# Patient Record
Sex: Female | Born: 1960 | Race: Black or African American | Hispanic: No | Marital: Married | Smoking: Former smoker
Health system: Southern US, Community
[De-identification: ages and names within clinical notes are randomized; demographics above are authoritative.]

## PROBLEM LIST (undated history)

## (undated) DIAGNOSIS — K5900 Constipation, unspecified: Secondary | ICD-10-CM

## (undated) DIAGNOSIS — M549 Dorsalgia, unspecified: Secondary | ICD-10-CM

## (undated) DIAGNOSIS — E876 Hypokalemia: Secondary | ICD-10-CM

## (undated) DIAGNOSIS — E669 Obesity, unspecified: Secondary | ICD-10-CM

## (undated) DIAGNOSIS — I499 Cardiac arrhythmia, unspecified: Secondary | ICD-10-CM

## (undated) DIAGNOSIS — B059 Measles without complication: Secondary | ICD-10-CM

## (undated) DIAGNOSIS — R600 Localized edema: Secondary | ICD-10-CM

## (undated) DIAGNOSIS — I471 Supraventricular tachycardia, unspecified: Secondary | ICD-10-CM

## (undated) DIAGNOSIS — Z0000 Encounter for general adult medical examination without abnormal findings: Secondary | ICD-10-CM

## (undated) DIAGNOSIS — F410 Panic disorder [episodic paroxysmal anxiety] without agoraphobia: Secondary | ICD-10-CM

## (undated) DIAGNOSIS — M2550 Pain in unspecified joint: Secondary | ICD-10-CM

## (undated) DIAGNOSIS — M1990 Unspecified osteoarthritis, unspecified site: Secondary | ICD-10-CM

## (undated) DIAGNOSIS — B019 Varicella without complication: Secondary | ICD-10-CM

## (undated) DIAGNOSIS — Z0001 Encounter for general adult medical examination with abnormal findings: Secondary | ICD-10-CM

## (undated) DIAGNOSIS — J302 Other seasonal allergic rhinitis: Secondary | ICD-10-CM

## (undated) DIAGNOSIS — D259 Leiomyoma of uterus, unspecified: Secondary | ICD-10-CM

## (undated) DIAGNOSIS — R42 Dizziness and giddiness: Secondary | ICD-10-CM

## (undated) DIAGNOSIS — I10 Essential (primary) hypertension: Secondary | ICD-10-CM

## (undated) DIAGNOSIS — I209 Angina pectoris, unspecified: Secondary | ICD-10-CM

## (undated) DIAGNOSIS — R0602 Shortness of breath: Secondary | ICD-10-CM

## (undated) HISTORY — DX: Measles without complication: B05.9

## (undated) HISTORY — DX: Varicella without complication: B01.9

## (undated) HISTORY — DX: Hypokalemia: E87.6

## (undated) HISTORY — DX: Localized edema: R60.0

## (undated) HISTORY — DX: Constipation, unspecified: K59.00

## (undated) HISTORY — DX: Shortness of breath: R06.02

## (undated) HISTORY — DX: Encounter for general adult medical examination with abnormal findings: Z00.01

## (undated) HISTORY — DX: Essential (primary) hypertension: I10

## (undated) HISTORY — DX: Other seasonal allergic rhinitis: J30.2

## (undated) HISTORY — DX: Dorsalgia, unspecified: M54.9

## (undated) HISTORY — DX: Dizziness and giddiness: R42

## (undated) HISTORY — DX: Encounter for general adult medical examination without abnormal findings: Z00.00

## (undated) HISTORY — DX: Pain in unspecified joint: M25.50

## (undated) HISTORY — DX: Obesity, unspecified: E66.9

## (undated) HISTORY — DX: Leiomyoma of uterus, unspecified: D25.9

---
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
## 1988-11-27 HISTORY — PX: ABDOMINAL HYSTERECTOMY: SHX81

## 1999-02-09 ENCOUNTER — Other Ambulatory Visit: Admission: RE | Admit: 1999-02-09 | Discharge: 1999-02-09 | Payer: Self-pay | Admitting: Obstetrics & Gynecology

## 2000-03-01 ENCOUNTER — Other Ambulatory Visit: Admission: RE | Admit: 2000-03-01 | Discharge: 2000-03-01 | Payer: Self-pay | Admitting: Obstetrics & Gynecology

## 2001-04-11 ENCOUNTER — Other Ambulatory Visit: Admission: RE | Admit: 2001-04-11 | Discharge: 2001-04-11 | Payer: Self-pay | Admitting: Obstetrics & Gynecology

## 2002-05-10 ENCOUNTER — Other Ambulatory Visit: Admission: RE | Admit: 2002-05-10 | Discharge: 2002-05-10 | Payer: Self-pay | Admitting: Obstetrics & Gynecology

## 2002-08-06 ENCOUNTER — Other Ambulatory Visit: Admission: RE | Admit: 2002-08-06 | Discharge: 2002-08-06 | Payer: Self-pay | Admitting: Obstetrics & Gynecology

## 2003-01-30 ENCOUNTER — Other Ambulatory Visit: Admission: RE | Admit: 2003-01-30 | Discharge: 2003-01-30 | Payer: Self-pay | Admitting: Obstetrics & Gynecology

## 2003-05-17 ENCOUNTER — Other Ambulatory Visit: Admission: RE | Admit: 2003-05-17 | Discharge: 2003-05-17 | Payer: Self-pay | Admitting: Obstetrics & Gynecology

## 2003-10-30 ENCOUNTER — Other Ambulatory Visit: Admission: RE | Admit: 2003-10-30 | Discharge: 2003-10-30 | Payer: Self-pay | Admitting: Obstetrics & Gynecology

## 2004-07-17 ENCOUNTER — Ambulatory Visit: Payer: Self-pay | Admitting: Internal Medicine

## 2004-12-07 ENCOUNTER — Other Ambulatory Visit: Admission: RE | Admit: 2004-12-07 | Discharge: 2004-12-07 | Payer: Self-pay | Admitting: Obstetrics & Gynecology

## 2004-12-23 ENCOUNTER — Ambulatory Visit: Payer: Self-pay | Admitting: Internal Medicine

## 2006-01-24 ENCOUNTER — Ambulatory Visit: Payer: Self-pay | Admitting: Internal Medicine

## 2006-03-03 ENCOUNTER — Ambulatory Visit: Payer: Self-pay | Admitting: Internal Medicine

## 2007-02-28 ENCOUNTER — Ambulatory Visit: Payer: Self-pay | Admitting: Internal Medicine

## 2007-02-28 DIAGNOSIS — M25569 Pain in unspecified knee: Secondary | ICD-10-CM | POA: Insufficient documentation

## 2007-02-28 DIAGNOSIS — E785 Hyperlipidemia, unspecified: Secondary | ICD-10-CM | POA: Insufficient documentation

## 2007-02-28 DIAGNOSIS — J309 Allergic rhinitis, unspecified: Secondary | ICD-10-CM | POA: Insufficient documentation

## 2007-03-08 ENCOUNTER — Encounter: Payer: Self-pay | Admitting: Internal Medicine

## 2007-03-17 ENCOUNTER — Ambulatory Visit (HOSPITAL_COMMUNITY): Admission: RE | Admit: 2007-03-17 | Discharge: 2007-03-17 | Payer: Self-pay | Admitting: Orthopedic Surgery

## 2007-06-05 ENCOUNTER — Telehealth: Payer: Self-pay | Admitting: Internal Medicine

## 2007-09-08 ENCOUNTER — Telehealth: Payer: Self-pay | Admitting: Internal Medicine

## 2008-01-04 ENCOUNTER — Ambulatory Visit: Payer: Self-pay | Admitting: Internal Medicine

## 2008-01-04 DIAGNOSIS — M545 Low back pain, unspecified: Secondary | ICD-10-CM | POA: Insufficient documentation

## 2008-01-04 DIAGNOSIS — M549 Dorsalgia, unspecified: Secondary | ICD-10-CM | POA: Insufficient documentation

## 2008-04-04 ENCOUNTER — Emergency Department (HOSPITAL_COMMUNITY): Admission: EM | Admit: 2008-04-04 | Discharge: 2008-04-04 | Payer: Self-pay | Admitting: Emergency Medicine

## 2009-12-07 ENCOUNTER — Emergency Department (HOSPITAL_BASED_OUTPATIENT_CLINIC_OR_DEPARTMENT_OTHER): Admission: EM | Admit: 2009-12-07 | Discharge: 2009-12-07 | Payer: Self-pay | Admitting: Emergency Medicine

## 2010-06-11 LAB — POCT CARDIAC MARKERS: Troponin i, poc: <0.05 ng/mL (ref 0.00–0.09)

## 2010-06-11 LAB — DIFFERENTIAL
Basophils Absolute: 0.1 10*3/uL (ref 0.0–0.1)
Eosinophils Absolute: 0.1 10*3/uL (ref 0.0–0.7)
Eosinophils Relative: 1 % (ref 0–5)
Monocytes Absolute: 0.6 10*3/uL (ref 0.1–1.0)
Monocytes Relative: 8 % (ref 3–12)
Neutro Abs: 4.1 10*3/uL (ref 1.7–7.7)
Neutrophils Relative %: 49 % (ref 43–77)

## 2010-06-11 LAB — CBC
Hemoglobin: 14.2 g/dL (ref 12.0–15.0)
MCV: 89.8 fL (ref 78.0–100.0)
RBC: 4.76 MIL/uL (ref 3.87–5.11)
RDW: 14.9 % (ref 11.5–15.5)
WBC: 8.2 10*3/uL (ref 4.0–10.5)

## 2010-06-11 LAB — BASIC METABOLIC PANEL
CO2: 23 mEq/L (ref 19–32)
Calcium: 9.7 mg/dL (ref 8.4–10.5)
Chloride: 108 mEq/L (ref 96–112)
Potassium: 3.4 mEq/L — ABNORMAL LOW (ref 3.5–5.1)

## 2011-03-30 DIAGNOSIS — I499 Cardiac arrhythmia, unspecified: Secondary | ICD-10-CM

## 2011-03-30 HISTORY — DX: Cardiac arrhythmia, unspecified: I49.9

## 2011-07-29 ENCOUNTER — Encounter: Payer: Self-pay | Admitting: Internal Medicine

## 2011-09-03 ENCOUNTER — Institutional Professional Consult (permissible substitution): Payer: Self-pay | Admitting: Internal Medicine

## 2011-09-21 ENCOUNTER — Encounter: Payer: Self-pay | Admitting: *Deleted

## 2011-09-21 ENCOUNTER — Ambulatory Visit (INDEPENDENT_AMBULATORY_CARE_PROVIDER_SITE_OTHER): Payer: 59 | Admitting: Internal Medicine

## 2011-09-21 ENCOUNTER — Encounter: Payer: Self-pay | Admitting: Internal Medicine

## 2011-09-21 VITALS — BP 140/56 | HR 100 | Resp 18 | Ht 63.0 in | Wt 215.0 lb

## 2011-09-21 DIAGNOSIS — I471 Supraventricular tachycardia: Secondary | ICD-10-CM | POA: Insufficient documentation

## 2011-09-21 DIAGNOSIS — I498 Other specified cardiac arrhythmias: Secondary | ICD-10-CM

## 2011-09-21 LAB — BASIC METABOLIC PANEL
Calcium: 9.6 mg/dL (ref 8.4–10.5)
Chloride: 105 mEq/L (ref 96–112)
Creatinine, Ser: 0.8 mg/dL (ref 0.4–1.2)
GFR: 98.58 mL/min (ref >60.00)
Sodium: 141 mEq/L (ref 135–145)

## 2011-09-21 LAB — CBC WITH DIFFERENTIAL/PLATELET
Basophils Relative: 0.8 % (ref 0.0–3.0)
Eosinophils Relative: 1.2 % (ref 0.0–5.0)
Lymphs Abs: 3.5 10*3/uL (ref 0.7–4.0)
MCHC: 32.5 g/dL (ref 30.0–36.0)
MCV: 90.5 fl (ref 78.0–100.0)
Monocytes Absolute: 0.9 10*3/uL (ref 0.1–1.0)
RDW: 15.5 % — ABNORMAL HIGH (ref 11.5–14.6)
WBC: 9.7 10*3/uL (ref 4.5–10.5)

## 2011-09-21 NOTE — Patient Instructions (Signed)

## 2011-09-21 NOTE — Assessment & Plan Note (Signed)
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 I've discussed the risk, goals, benefits, and expectations of catheter ablation. She wishes to proceed. This will be scheduled early as possible pending at time.

## 2011-09-21 NOTE — Progress Notes (Signed)
HPI Sherri Sullivan returns today for followup. She is a very pleasant middle-aged woman with a history of tachycardia palpitations and documented SVT for over 2 years. The episodes start and stop suddenly. She's been on beta blockers without improvement. When she goes into SVT she feels short of breath but has not had syncope and does not have angina. Her baseline echocardiogram demonstrates no ventricular preexcitation. There is no history of atrial fibrillation. No Known Allergies   Current Outpatient Prescriptions  Medication Sig Dispense Refill  . aspirin 81 MG tablet Take 81 mg by mouth daily.      . cetirizine (ZYRTEC) 10 MG tablet Take 10 mg by mouth daily.      . chlorthalidone (HYGROTON) 25 MG tablet Take 25 mg by mouth daily.      . cyclobenzaprine (FLEXERIL) 5 MG tablet Take 5 mg by mouth 3 (three) times daily as needed.      . estrogens, conjugated, (PREMARIN) 0.625 MG tablet Take 0.625 mg by mouth daily. Take daily for 21 days then do not take for 7 days.      . metoprolol succinate (TOPROL-XL) 25 MG 24 hr tablet Take 25 mg by mouth daily.      . naproxen (NAPROSYN) 500 MG tablet Take 500 mg by mouth 2 (two) times daily with a meal.      . OMEGA-3 KRILL OIL PO Take 353 mg by mouth daily.      . potassium chloride SA (K-DUR,KLOR-CON) 20 MEQ tablet Take 20 mEq by mouth 2 (two) times daily.         Past Medical History  Diagnosis Date  . Chest pain   . Rapid heart rate   . Dizziness   . SOB (shortness of breath)   . HTN (hypertension)     ROS:   All systems reviewed and negative except as noted in the HPI.   Past Surgical History  Procedure Date  . Abdominal hysterectomy      No family history on file.   History   Social History  . Marital Status: Married    Spouse Name: N/A    Number of Children: N/A  . Years of Education: N/A   Occupational History  . Not on file.   Social History Main Topics  . Smoking status: Former Smoker    Types: Cigarettes   Quit date: 03/29/1984  . Smokeless tobacco: Not on file  . Alcohol Use: Yes     wine 4 glasses a week  . Drug Use: No  . Sexually Active: Not on file   Other Topics Concern  . Not on file   Social History Narrative  . No narrative on file     BP 140/56  Pulse 100  Resp 18  Ht 5' 3" (1.6 m)  Wt 215 lb (97.523 kg)  BMI 38.09 kg/m2  Physical Exam:  Well appearing middle-aged woman, NAD HEENT: Unremarkable Neck:  No JVD, no thyromegally Lungs:  Clear with no wheezes, rales, or rhonchi. HEART:  Regular tachy rhythm, no murmurs, no rubs, no clicks Abd:  soft, positive bowel sounds, no organomegally, no rebound, no guarding Ext:  2 plus pulses, no edema, no cyanosis, no clubbing Skin:  No rashes no nodules Neuro:  CN II through XII intact, motor grossly intact  EKG SVT at 155 beats per minute  Assess/Plan:   

## 2011-09-22 ENCOUNTER — Encounter (HOSPITAL_COMMUNITY): Payer: Self-pay | Admitting: Respiratory Therapy

## 2011-09-23 ENCOUNTER — Ambulatory Visit (HOSPITAL_COMMUNITY)
Admission: AD | Admit: 2011-09-23 | Discharge: 2011-09-24 | Disposition: A | Discharge status: Home / Self Care | Payer: 59 | Source: Ambulatory Visit | Attending: Internal Medicine | Admitting: Internal Medicine

## 2011-09-23 ENCOUNTER — Encounter (HOSPITAL_COMMUNITY)
Admission: AD | Disposition: A | Discharge status: Home / Self Care | Payer: Self-pay | Source: Ambulatory Visit | Attending: Internal Medicine

## 2011-09-23 ENCOUNTER — Encounter (HOSPITAL_COMMUNITY): Payer: Self-pay | Admitting: General Practice

## 2011-09-23 DIAGNOSIS — I471 Supraventricular tachycardia: Secondary | ICD-10-CM

## 2011-09-23 DIAGNOSIS — I498 Other specified cardiac arrhythmias: Secondary | ICD-10-CM | POA: Insufficient documentation

## 2011-09-23 DIAGNOSIS — R0602 Shortness of breath: Secondary | ICD-10-CM

## 2011-09-23 DIAGNOSIS — I10 Essential (primary) hypertension: Secondary | ICD-10-CM | POA: Insufficient documentation

## 2011-09-23 HISTORY — DX: Supraventricular tachycardia, unspecified: I47.10

## 2011-09-23 HISTORY — DX: Supraventricular tachycardia: I47.1

## 2011-09-23 HISTORY — DX: Shortness of breath: R06.02

## 2011-09-23 HISTORY — DX: Angina pectoris, unspecified: I20.9

## 2011-09-23 HISTORY — PX: CARDIAC ELECTROPHYSIOLOGY STUDY AND ABLATION: SHX1294

## 2011-09-23 HISTORY — PX: SUPRAVENTRICULAR TACHYCARDIA ABLATION: SHX5492

## 2011-09-23 LAB — BASIC METABOLIC PANEL
Calcium: 9.5 mg/dL (ref 8.4–10.5)
Creatinine, Ser: 0.61 mg/dL (ref 0.50–1.10)
GFR calc Af Amer: >90 mL/min (ref >90)

## 2011-09-23 SURGERY — SUPRAVENTRICULAR TACHYCARDIA ABLATION
Anesthesia: LOCAL

## 2011-09-23 MED ORDER — CYCLOBENZAPRINE HCL 5 MG PO TABS
5.0000 mg | ORAL_TABLET | Freq: Three times a day (TID) | ORAL | Status: DC | PRN
  Filled 2011-09-23: qty 1

## 2011-09-23 MED ORDER — MIDAZOLAM HCL 5 MG/5ML IJ SOLN
INTRAMUSCULAR | Status: AC
  Filled 2011-09-23: qty 5

## 2011-09-23 MED ORDER — METOPROLOL SUCCINATE ER 25 MG PO TB24
25.0000 mg | ORAL_TABLET | Freq: Every day | ORAL | Status: DC
  Filled 2011-09-23 (×2): qty 1

## 2011-09-23 MED ORDER — SODIUM CHLORIDE 0.9 % IJ SOLN: 3.0000 mL | INTRAMUSCULAR | Status: DC | PRN

## 2011-09-23 MED ORDER — BUPIVACAINE HCL (PF) 0.25 % IJ SOLN
INTRAMUSCULAR | Status: AC
  Filled 2011-09-23: qty 60

## 2011-09-23 MED ORDER — FENTANYL CITRATE 0.05 MG/ML IJ SOLN
INTRAMUSCULAR | Status: AC
  Filled 2011-09-23: qty 2

## 2011-09-23 MED ORDER — ONDANSETRON HCL 4 MG/2ML IJ SOLN: 4.0000 mg | Freq: Four times a day (QID) | INTRAMUSCULAR | Status: DC | PRN

## 2011-09-23 MED ORDER — CHLORTHALIDONE 25 MG PO TABS
25.0000 mg | ORAL_TABLET | Freq: Every day | ORAL | Status: DC
  Filled 2011-09-23 (×2): qty 1

## 2011-09-23 MED ORDER — HYDROCODONE-ACETAMINOPHEN 5-325 MG PO TABS
1.0000 | ORAL_TABLET | ORAL | Status: DC | PRN
  Administered 2011-09-24 (×2): 1 via ORAL
  Filled 2011-09-23 (×2): qty 1

## 2011-09-23 MED ORDER — POTASSIUM CHLORIDE CRYS ER 20 MEQ PO TBCR
20.0000 meq | EXTENDED_RELEASE_TABLET | Freq: Two times a day (BID) | ORAL | Status: DC
  Administered 2011-09-23 – 2011-09-24 (×2): 20 meq via ORAL
  Filled 2011-09-23 (×3): qty 1

## 2011-09-23 MED ORDER — FENTANYL CITRATE 0.05 MG/ML IJ SOLN
INTRAMUSCULAR | Status: AC
Start: 2011-09-23 — End: 2011-09-23
  Filled 2011-09-23: qty 2

## 2011-09-23 MED ORDER — SODIUM CHLORIDE 0.9 % IJ SOLN
3.0000 mL | Freq: Two times a day (BID) | INTRAMUSCULAR | Status: DC
  Administered 2011-09-23: 3 mL via INTRAVENOUS

## 2011-09-23 MED ORDER — NAPROXEN 500 MG PO TABS
500.0000 mg | ORAL_TABLET | Freq: Two times a day (BID) | ORAL | Status: DC
  Filled 2011-09-23 (×4): qty 1

## 2011-09-23 MED ORDER — SODIUM CHLORIDE 0.9 % IV SOLN: 250.0000 mL | INTRAVENOUS | Status: DC | PRN

## 2011-09-23 MED ORDER — POTASSIUM CHLORIDE 10 MEQ/100ML IV SOLN
10.0000 meq | INTRAVENOUS | Status: DC
  Filled 2011-09-23: qty 100

## 2011-09-23 MED ORDER — ACETAMINOPHEN 325 MG PO TABS: 650.0000 mg | ORAL_TABLET | ORAL | Status: DC | PRN

## 2011-09-23 MED ORDER — ESTROGENS CONJUGATED 0.625 MG PO TABS
0.6250 mg | ORAL_TABLET | Freq: Every day | ORAL | Status: DC
  Filled 2011-09-23 (×2): qty 1

## 2011-09-23 NOTE — Interval H&P Note (Signed)
History and Physical Interval Note:  09/23/2011 3:33 PM  Sherri Sullivan  has presented today for surgery, with the diagnosis of svt  The various methods of treatment have been discussed with the patient and family. After consideration of risks, benefits and other options for treatment, the patient has consented to  Procedure(s) (LRB): SUPRAVENTRICULAR TACHYCARDIA ABLATION (N/A) as a surgical intervention .  The patient's history has been reviewed, patient examined, no change in status, stable for surgery.  I have reviewed the patients' chart and labs.  Questions were answered to the patient's satisfaction.     Lewayne Bunting

## 2011-09-23 NOTE — H&P (View-Only) (Signed)
HPI Sherri Sullivan returns today for followup. She is a very pleasant middle-aged woman with a history of tachycardia palpitations and documented SVT for over 2 years. The episodes start and stop suddenly. She's been on beta blockers without improvement. When she goes into SVT she feels short of breath but has not had syncope and does not have angina. Her baseline echocardiogram demonstrates no ventricular preexcitation. There is no history of atrial fibrillation. No Known Allergies   Current Outpatient Prescriptions  Medication Sig Dispense Refill  . aspirin 81 MG tablet Take 81 mg by mouth daily.      . cetirizine (ZYRTEC) 10 MG tablet Take 10 mg by mouth daily.      . chlorthalidone (HYGROTON) 25 MG tablet Take 25 mg by mouth daily.      . cyclobenzaprine (FLEXERIL) 5 MG tablet Take 5 mg by mouth 3 (three) times daily as needed.      Marland Kitchen estrogens, conjugated, (PREMARIN) 0.625 MG tablet Take 0.625 mg by mouth daily. Take daily for 21 days then do not take for 7 days.      . metoprolol succinate (TOPROL-XL) 25 MG 24 hr tablet Take 25 mg by mouth daily.      . naproxen (NAPROSYN) 500 MG tablet Take 500 mg by mouth 2 (two) times daily with a meal.      . OMEGA-3 KRILL OIL PO Take 353 mg by mouth daily.      . potassium chloride SA (K-DUR,KLOR-CON) 20 MEQ tablet Take 20 mEq by mouth 2 (two) times daily.         Past Medical History  Diagnosis Date  . Chest pain   . Rapid heart rate   . Dizziness   . SOB (shortness of breath)   . HTN (hypertension)     ROS:   All systems reviewed and negative except as noted in the HPI.   Past Surgical History  Procedure Date  . Abdominal hysterectomy      No family history on file.   History   Social History  . Marital Status: Married    Spouse Name: N/A    Number of Children: N/A  . Years of Education: N/A   Occupational History  . Not on file.   Social History Main Topics  . Smoking status: Former Smoker    Types: Cigarettes   Quit date: 03/29/1984  . Smokeless tobacco: Not on file  . Alcohol Use: Yes     wine 4 glasses a week  . Drug Use: No  . Sexually Active: Not on file   Other Topics Concern  . Not on file   Social History Narrative  . No narrative on file     BP 140/56  Pulse 100  Resp 18  Ht 5\' 3"  (1.6 m)  Wt 215 lb (97.523 kg)  BMI 38.09 kg/m2  Physical Exam:  Well appearing middle-aged woman, NAD HEENT: Unremarkable Neck:  No JVD, no thyromegally Lungs:  Clear with no wheezes, rales, or rhonchi. HEART:  Regular tachy rhythm, no murmurs, no rubs, no clicks Abd:  soft, positive bowel sounds, no organomegally, no rebound, no guarding Ext:  2 plus pulses, no edema, no cyanosis, no clubbing Skin:  No rashes no nodules Neuro:  CN II through XII intact, motor grossly intact  EKG SVT at 155 beats per minute  Assess/Plan:

## 2011-09-23 NOTE — Op Note (Signed)
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 M#[id number].

## 2011-09-24 DIAGNOSIS — I498 Other specified cardiac arrhythmias: Secondary | ICD-10-CM

## 2011-09-24 MED ORDER — OFF THE BEAT BOOK
Freq: Once | Status: AC
  Administered 2011-09-24: 13:00:00
  Filled 2011-09-24 (×2): qty 1

## 2011-09-24 NOTE — Discharge Summary (Signed)
ELECTROPHYSIOLOGY PROCEDURE DISCHARGE SUMMARY    Patient ID: Sherri Sullivan,  MRN: [id number], DOB/AGE: Jan 04, 1961 51 y.o.  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  Hypertension  Procedures This Admission:  1.  Electrophysiology study and radiofrequency catheter ablation of AVNRT on 09-23-2011 by Dr Sherri Sullivan.  This demonstrated successful ablation with no inducible arrhythmias following ablation. There were no early apparent complications  Brief HPI: Sherri Sullivan is a 51 year old female who has been followed by Dr Sherri Sullivan for SVT. She is a very pleasant middle-aged woman with a history of tachycardia palpitations and documented SVT for over 2 years. The episodes start and stop suddenly. She's been on beta blockers without improvement. When she goes into SVT she feels short of breath but has not had syncope and does not have angina. Her baseline echocardiogram demonstrates no ventricular preexcitation. There is no history of atrial fibrillation. Because of persistent SVT despite medical therapy, catheter ablation was recommended. Risks, benefits, and alternatives were reviewed with the patient who wished to proceed.  Hospital Course:  The patient was admitted on 09-23-2011 and underwent successful ablation of AVNRT.  She was monitored on telemetry overnight which demonstrated sinus rhythm.  Her groin and neck incisions were without complications.  Dr Sherri Sullivan examined the patient and considered her stable for discharge to home off of Toprol.  Discharge Vitals: Blood pressure 103/67, pulse 70, temperature 97.5 F (36.4 C), temperature source Oral, resp. rate 18, height 5\' 3"  (1.6 m), weight 215 lb (97.523 kg), SpO2 97.00%.    Labs:  Lab 09/23/11 1509  NA 143  K 3.3*  CL 102  CO2 27  BUN  14  CREATININE 0.61  CALCIUM 9.5  PROT --  BILITOT --  ALKPHOS --  ALT --  AST --  GLUCOSE 81   Discharge Medications:  Medication List  As of 09/24/2011  2:13 PM   STOP taking these medications         metoprolol succinate 25 MG 24 hr tablet         TAKE these medications         aspirin 81 MG tablet   Take 81 mg by mouth daily.      cetirizine 10 MG tablet   Commonly known as: ZYRTEC   Take 10 mg by mouth daily.      chlorthalidone 25 MG tablet   Commonly known as: HYGROTON   Take 25 mg by mouth daily.      cyclobenzaprine 5 MG tablet   Commonly known as: FLEXERIL   Take 5 mg by mouth 3 (three) times daily as needed. For muscle pain      estrogens (conjugated) 0.625 MG tablet   Commonly known as: PREMARIN   Take 0.625 mg by mouth daily. Take daily for 21 days then do not take for 7 days.      naproxen 500 MG tablet   Commonly known as: NAPROSYN   Take 500 mg by mouth 2 (two) times daily with a meal.      OMEGA-3 KRILL OIL PO   Take 353 mg by mouth daily.      potassium chloride SA 20 MEQ tablet   Commonly known as: K-DUR,KLOR-CON   Take 20 mEq by mouth 2 (two) times daily.  Disposition:  Discharge Orders    Future Appointments: Provider: Department: Dept Phone: Center:   09/29/2011 11:00 AM [facility] [phone] [facility]   11/10/2011 2:00 PM Marinus Maw, MD [facility] [phone] [facility]     Future Orders Please Complete By Expires   Diet - Sullivan sodium heart healthy      Increase activity slowly      Discharge instructions      Comments:   Please see post procedure instructions.     Follow-up Information    Follow up with Sherri Bunting, MD on 11/10/2011. (At 2:00 PM)    Contact information:   [address] [place] [address] [phone]         Duration of Discharge Encounter: Greater than 30 minutes including physician  time.  Signed, Gypsy Balsam, RN, BSN 09/24/2011, 2:13 PM

## 2011-09-24 NOTE — Discharge Instructions (Addendum)
Keep procedure site clean & dry. If you notice increased pain, swelling, bleeding or pus, call or return!  You may shower, but no soaking baths/hot tubs/pools for 1 week. No driving for 3 days. No lifting over 5 lbs for 1 week. Groin Site Care Refer to this sheet in the next few weeks. These instructions provide you with information on caring for yourself after your procedure. Your caregiver may also give you more specific instructions. Your treatment has been planned according to current medical practices, but problems sometimes occur. Call your caregiver if you have any problems or questions after your procedure. HOME CARE INSTRUCTIONS  You may shower 24 hours after the procedure. Remove the bandage (dressing) and gently wash the site with plain soap and water. Gently pat the site dry.   Do not apply powder or lotion to the site.   Do not sit in a bathtub, swimming pool, or whirlpool for 5 to 7 days.   No bending, squatting, or lifting anything over 10 pounds (4.5 kg) as directed by your caregiver.   Inspect the site at least twice daily.   Do not drive home if you are discharged the same day of the procedure. Have someone else drive you.   You may drive 24 hours after the procedure unless otherwise instructed by your caregiver.  What to expect:  Any bruising will usually fade within 1 to 2 weeks.   Blood that collects in the tissue (hematoma) may be painful to the touch. It should usually decrease in size and tenderness within 1 to 2 weeks.  SEEK IMMEDIATE MEDICAL CARE IF:  You have unusual pain at the groin site or down the affected leg.   You have redness, warmth, swelling, or pain at the groin site.   You have drainage (other than a small amount of blood on the dressing).   You have chills.   You have a fever or persistent symptoms for more than 72 hours.   You have a fever and your symptoms suddenly get worse.   Your leg becomes pale, cool, tingly, or numb.   You have  heavy bleeding from the site. Hold pressure on the site.  Document Released: 04/17/2010 Document Revised: 03/04/2011 Document Reviewed: 04/17/2010 [facility] Patient Information 2012 [place], [place].Cardiac Ablation Cardiac means related to the heart. Ablation means destruction. Cardiac ablation is a procedure to disable a small amount of heart tissue in very specific places. The heart has many electrical connections. Sometimes these connections can become abnormal and can cause the heart to beat very fast or irregularly. By disabling some of the problem areas, the heart rhythm can be improved or made normal. Ablation is done for people who:  Have Wolf-Parkinson-White syndrome.   Have other fast heart rhythms known as tachycardia.   Have tried medications for an abnormal heart rhythm that resulted in:   No success.   Side effects.   May have a high risk heart beat (arrhythmia) that could result in death.  LET YOUR CAREGIVER KNOW ABOUT:   All previous heart procedures, such as:   Surgeries.   Tests.   Heart conditions.   Allergies or previous reactions to:   Food.   Medicine.   Tape.  RISKS AND COMPLICATIONS  Depending on how long it takes to do the ablation, the dose of radiation can be high. This can increase the risk of cancer.   Bruising and bleeding where the catheter was inserted.   Bleeding into the chest, especially into the  sack that surrounds the heart, is a serious complication that sometimes occurs.   A permanent pacemaker may be needed if the normal electrical system is damaged.   The procedure may not be fully effective, and this may not be recognized for months. Repeat ablations are sometimes required.  BEFORE THE PROCEDURE   Let your caregiver know if:   You have an allergy to X-ray dye or seafood.   If you have kidney failure.   If you have had a kidney transplant.   Follow your caregiver's instructions regarding eating and drinking before the  procedure.   Take your medications as directed at regular times with water unless instructed otherwise. If you are on insulin, ask how you are to take it and if there are special instructions. It is common to adjust insulin dosing the day of the ablation.  PROCEDURE  An ablation is usually performed in a catheterization laboratory with the guidance of fluoroscopy. Fluoroscopy is a type of X-ray that helps your caregiver see images of your heart during the procedure.   An IV will be started before the procedure begins. You will be given a sedative to help you relax.   An ablation is a minimally invasive procedure. This means a small cut (incision) is made in either your neck or groin. Your caregiver will decide where to make the incision based on your medical history and physical exam.   The skin on your neck or groin will be numbed. A needle will be inserted into a large vein in your neck or groin and a thin, flexible tube called a catheter will be threaded to your heart.   A special dye that shows up on fluoroscopy pictures may be injected through the catheter. The dye helps your caregiver see the area of the heart that needs treatment.   The catheter has electrodes on the tip. When the area of heart tissue that is causing the abnormal heart rhythm is found, the catheter tip will send an electrical current to the area and "scar" the tissue.   Three types of energy can be used to ablate the heart tissue:   Heat (radiofrequency energy).   Laser energy.   Extreme cold (cryoablation).   When the area of the heart has been ablated, the catheter will be taken out. Pressure will be held on the insertion site. This will help the insertion site clot and keep it from bleeding. A bandage will be placed on the insertion site.   An ablation procedure can take 1 to 6 hours to complete.  AFTER THE PROCEDURE   After the procedure, you will be taken to a recovery area where your vital signs (blood  pressure, heart rate and breathing) will be monitored. The insertion site will also be monitored for bleeding.   You will need to lie still for 4 to 6 hours. This is to ensure you do not bleed from the catheter insertion site.   If your blood pressure and heart rate are stable and no bleeding occurs at the insertion site, you may go home the same day as your procedure.   If complications occur or your caregiver feels you should be watched, you may need to stay in the hospital overnight.  HOME CARE INSTRUCTIONS   The day following the procedure, the bandage can be removed. If the insertion site oozes a small amount of blood, such as a few teaspoons (15 ml), place a clean bandage over the insertion site.   Ask  your caregiver when you may shower.   Do not submerge the insertion site in water. This means do not sit in a bathtub, hot tub or swimming pool for 5 days or as instructed by your caregiver.  SEEK MEDICAL CARE IF:   There is swelling larger than a walnut or half a lemon at the insertion site.   You develop an oral temperature of more than 100.5 F (38.1 C).   The insertion site:   Becomes red and swollen.   Is Painful.   Has drainage that is tan, yellow or green in color.  SEEK IMMEDIATE MEDICAL CARE IF:   Bleeding from the insertion site does not stop. Hold pressure to this area. Call your local emergency service (911 in the [name]) immediately!   If your insertion site was in your groin, and the leg that has the insertion site becomes:   Blue.   Pale and/or cold.   Numb.   You develop chest pain that is crushing or pressure-like.   You have difficulty breathing or shortness of breath.   You develop an oral temperature of more than 101 F (38.3 C).  MAKE SURE YOU:   Understand these instructions.   Will watch your condition.   Will get help right away if you are not doing well or get worse.  Document Released: 08/01/2008 Document Revised: 03/04/2011 Document  Reviewed: 08/01/2008 [facility] Patient Information 2012 [place], [place].

## 2011-09-24 NOTE — Op Note (Signed)
Sherri Sullivan, Sherri Sullivan                 ACCOUNT NO.:  [id number]  MEDICAL RECORD NO.:  [id number]  LOCATION:  [facility]                         FACILITY:  [facility]  PHYSICIAN:  Doylene Canning. 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  INTRODUCTION:  The patient is a very pleasant 51 year old woman with a history of tachy palpitations for many years.  These have increased in frequency and severity.  She is now referred for catheter ablation.  PROCEDURE:  After informed consent was obtained, the patient was taken to the diagnostic EP lab in a fasting state.  After usual preparation and draping, intravenous fentanyl and midazolam was given for sedation. A 6-French quadripolar catheter was inserted percutaneously in the right femoral vein and advanced to the right ventricle.  A 6-French quadripolar catheter was inserted percutaneously in the right femoral vein and advanced to the His-bundle region.  A 6-French hexapolar catheter was inserted percutaneously in the right jugular vein and advanced coronary sinus.  After measurement of the basic intervals. Rapid ventricular pacing was carried out from the right ventricle at a base drive cycle length of 454 msec.  The S1-S2 interval stepwise decreased down to 270 msec where ventricular refractoriness was observed.  During programed ventricular stimulation, the atrial activation sequence was midline and decremental.  There were no inducible arrhythmias.  Rapid ventricular pacing was carried out from the right ventricle and stepwise decreased down to 360 msec where VA Wenckebach was observed.  During rapid ventricular pacing, the atrial activation sequence was midline and decremental.  Programed atrial stimulation was carried out from the coronary sinus at a  base drive cycle length of 098 msec.  The S1-S2 interval stepwise decreased down to 390 msec resulting in the induction of SVT.  Rapid ventricular pacing would terminate the arrhythmias.  Rapid atrial pacing was carried out from the atrium at a base drive cycle length of 119 msec and stepwise decreased down forward 10 msec where SVT was again induced.  Following catheter ablation, rapid atrial pacing demonstrated an AV Wenckebach cycle length of 400 msec.  Additional rapid atrial pacing was carried out, resulting in induction of SVT.  During SVT, PVCs replaced at the time of His-bundle refractoriness demonstrating no atrial pre- excitation.  Rapid ventricular pacing was carried out from the right ventricle demonstrating a AV activation sequence.  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  A 7-French quadripolar ablation catheter was inserted percutaneously through the right femoral vein and advanced into the His-bundle region.  Mapping was carried out in koch's triangle.  Mapping demonstrated a much larger than usual.  Koch's triangle.  Two RF energy applications were then delivered to sites 6 through 7 in koch's triangle resulting in accelerated junctional rhythm.  Following catheter ablation, rapid atrial pacing was carried out from the coronary sinus and stepwise decreased down to 40 msec where AV Wenckebach was observed.  During rapid atrial pacing the PR interval now was  less than the RR interval and there was no inducible SVT.  Programed atrial stimulation was carried out following ablation and also demonstrated no inducible SVT.  At this point, the patient was observed for 30 minutes and had no recurrent inducible SVT.  It should be noted that rapid ventricular pacing as well as programed ventricular stimulation were also carried out following ablation and there were no inducible arrhythmias.  The catheter was then removed, hemostasis was assured,  and the patient was returned to her room in satisfactory condition.  COMPLICATIONS:  There were no immediate procedure complications.  RESULTS:  A.  Baseline ECG.  Baseline ECG demonstrates normal sinus rhythm with normal axis and intervals. B.  Baseline intervals.  Sinus node cycle length was 890 msec.  The QRS duration was 100 msec, the HV interval was 53 msec and the PR interval was 190 msec.  The AH interval 104 msec. C.  Rapid ventricular pacing.  Rapid ventricular pacing was carried out from the right ventricle and stepwise decreased down to 360 msec, where VA Wenckebach was observed.  During rapid atrial pacing, the atrial activation was midline and decremental. D.  Programed ventricular stimulation.  Programed ventricular stimulation was carried out from the right ventricle at base drive cycle length of 161 msec.  The S1-S2 interval stepwise decreased down to 270 msec where ventricular refractoriness was observed.  During programed ventricular stimulation, the atrial activation sequence was midline and decremental. E.  Rapid atrial pacing.  Rapid atrial pacing was carried out from the right atrium and the coronary sinus at base drive cycle length of 096 msec.  The S1-S2 interval stepwise decreased down to 400 msec where AV Wenckebach was observed.  During rapid atrial pacing, the PR interval was initially greater than the RR interval and there was inducible SVT. Following catheter ablation, the PR interval was less than the RR interval. F.  Programed atrial stimulation.  Programed atrial stimulation was carried out from the atrium at base drive cycle length of 045 msec.  The S1-S2 interval stepwise decreased down to 380 msec following catheter ablation, where the AV node ERP was observed.  During programed atrial stimulation, prior to ablation, there were multiple a shunt echo beats and inducible SVT.  Following ablation, there was no slow pathway conduction. G.  Arrhythmias  observed, 1 AV nodal reentrant tachycardia initiation was with either programed atrial stimulation or rapid atrial pacing, the duration was sustained and the termination was with rapid ventricular pacing or rapid atrial pacing. H.  Mapping.  Mapping was carried out from the koch's triangle demonstrated a larger than normal size koch's triangle. 1. RF energy application.  A total of 2 RF energy applications were     delivered to sites 6 through 7 in koch's triangle resulting in     accelerated junctional rhythm and rendering the slow pathway absent     and the tachycardia noninducible.  CONCLUSION:  This study demonstrates successful electrophysiologic study RF catheter ablation of easily inducible AV nodal e reentrant tachycardia with 2 RF energy applications delivered to sites 6 and 7 in koch's triangle.  Following ablation, there were no residual inducible arrhythmias.     Doylene Canning. Ladona Ridgel, MD     GWT/MEDQ  D:  09/23/2011  T:  09/24/2011  Job:  [id number]

## 2011-09-24 NOTE — Progress Notes (Addendum)
   ELECTROPHYSIOLOGY ROUNDING NOTE    Patient Name: Sherri Sullivan Date of Encounter: 09-24-2011    SUBJECTIVE:Patient feels well.  No chest pain or shortness of breath. 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  TELEMETRY: Reviewed telemetry pt in sinus rhythm Filed Vitals:   09/24/11 0000 09/24/11 0100 09/24/11 0400 09/24/11 0452  BP: 113/65 107/63 103/60   Pulse: 81 72 74 69  Temp: 98.5 F (36.9 C)  97.8 F (36.6 C)   TempSrc:   Oral   Resp: 19  18   Height:      Weight:      SpO2: 98% 98% 95% 98%    Intake/Output Summary (Last 24 hours) at 09/24/11 0710 Last data filed at 09/24/11 0000  Gross per 24 hour  Intake    555 ml  Output    600 ml  Net    -45 ml   LABS: Basic Metabolic Panel:  Basename 09/23/11 1509 09/21/11 1210  NA 143 141  K 3.3* 2.8*  CL 102 105  CO2 27 26  GLUCOSE 81 102*  BUN 14 19  CREATININE 0.61 0.8  CALCIUM 9.5 9.6  MG -- --  PHOS -- --  CBC:  Basename 09/21/11 1210  WBC 9.7  NEUTROABS 5.1  HGB 13.9  HCT 42.7  MCV 90.5  PLT 256.0   PHYSICAL EXAM Neck and groin incisions without hematoma  Wound care, restrictions reviewed with patient.  Follow-up appointment scheduled for 6 weeks.   A/P 1. SVT 2. S/P EPS/RFA AVNRT - ok to discharge home off of flecainide and beta blocker. Usual followup.  Lewayne Bunting, M.D.

## 2011-09-29 ENCOUNTER — Other Ambulatory Visit: Payer: 59

## 2011-11-10 ENCOUNTER — Encounter: Payer: Self-pay | Admitting: Internal Medicine

## 2011-11-10 ENCOUNTER — Ambulatory Visit (INDEPENDENT_AMBULATORY_CARE_PROVIDER_SITE_OTHER): Payer: 59 | Admitting: Internal Medicine

## 2011-11-10 VITALS — BP 130/72 | HR 85 | Resp 18 | Wt 221.0 lb

## 2011-11-10 DIAGNOSIS — I498 Other specified cardiac arrhythmias: Secondary | ICD-10-CM

## 2011-11-10 DIAGNOSIS — E785 Hyperlipidemia, unspecified: Secondary | ICD-10-CM

## 2011-11-10 DIAGNOSIS — I471 Supraventricular tachycardia: Secondary | ICD-10-CM

## 2011-11-10 NOTE — Assessment & Plan Note (Signed)
She has had no recurrent symptoms since her catheter ablation. She will follow up with [name] on an as-needed basis.

## 2011-11-10 NOTE — Progress Notes (Signed)
HPI Sherri Sullivan returns today for followup. She is a very pleasant middle-age woman with a history of symptomatic SVT who underwent catheter ablation several weeks ago. She's had no recurrent tachypalpitations. She notes occasional skipped beats as had no sustained arrhythmias. She denies syncope, chest pain, or shortness of breath. No peripheral edema. No Known Allergies   Current Outpatient Prescriptions  Medication Sig Dispense Refill  . aspirin 81 MG tablet Take 81 mg by mouth daily.      . cetirizine (ZYRTEC) 10 MG tablet Take 10 mg by mouth daily.      . chlorthalidone (HYGROTON) 25 MG tablet Take 25 mg by mouth daily.      . cyclobenzaprine (FLEXERIL) 5 MG tablet Take 5 mg by mouth 3 (three) times daily as needed. For muscle pain      . estrogens, conjugated, (PREMARIN) 0.625 MG tablet Take 0.625 mg by mouth daily. Take daily for 21 days then do not take for 7 days.      . naproxen (NAPROSYN) 500 MG tablet Take 500 mg by mouth 2 (two) times daily with a meal.      . OMEGA-3 KRILL OIL PO Take 353 mg by mouth daily.      . potassium chloride SA (K-DUR,KLOR-CON) 20 MEQ tablet Take 20 mEq by mouth 2 (two) times daily.         Past Medical History  Diagnosis Date  . Dizziness   . HTN (hypertension)   . SVT (supraventricular tachycardia)   . Anginal pain   . SOB (shortness of breath) 09/23/11    "a little bit; at rest; before ablation"    ROS:   All systems reviewed and negative except as noted in the HPI.   Past Surgical History  Procedure Date  . Cardiac electrophysiology study and ablation 09/23/11  . Abdominal hysterectomy 1990's  . Tubal ligation 1980's     No family history on file.   History   Social History  . Marital Status: Married    Spouse Name: N/A    Number of Children: N/A  . Years of Education: N/A   Occupational History  . Not on file.   Social History Main Topics  . Smoking status: Former Smoker -- 0.1 packs/day for 8 years    Types:  Cigarettes    Quit date: 03/29/1984  . Smokeless tobacco: Never Used  . Alcohol Use: 8.4 oz/week    14 Glasses of wine per week  . Drug Use: No  . Sexually Active: Not Currently   Other Topics Concern  . Not on file   Social History Narrative  . No narrative on file     BP 130/72  Pulse 85  Resp 18  Wt 221 lb (100.245 kg)  SpO2 98%  Physical Exam:  Well appearing middle-aged woman, NAD HEENT: Unremarkable Neck:  No JVD, no thyromegally Lungs:  Clear with no wheezes, rales, or rhonchi. HEART:  Regular rate rhythm, no murmurs, no rubs, no clicks Abd:  soft, positive bowel sounds, no organomegally, no rebound, no guarding Ext:  2 plus pulses, no edema, no cyanosis, no clubbing Skin:  No rashes no nodules Neuro:  CN II through XII intact, motor grossly intact  EKG Normal sinus rhythm with left atrial enlargement  Assess/Plan:

## 2011-11-10 NOTE — Patient Instructions (Signed)
Your physician recommends that you schedule a follow-up appointment as needed  

## 2011-11-10 NOTE — Assessment & Plan Note (Signed)
She is not exercising. I've encouraged low-fat diet and increase in physical activity and weight loss.

## 2011-11-16 NOTE — Addendum Note (Signed)
Addended by: Micki Riley C on: 11/16/2011 09:23 AM   Modules accepted: Orders

## 2012-01-28 LAB — HM PAP SMEAR: HM Pap smear: NORMAL

## 2012-01-28 LAB — HM MAMMOGRAPHY: HM Mammogram: NORMAL

## 2012-02-18 ENCOUNTER — Other Ambulatory Visit (HOSPITAL_BASED_OUTPATIENT_CLINIC_OR_DEPARTMENT_OTHER): Payer: Self-pay | Admitting: Internal Medicine

## 2012-02-18 ENCOUNTER — Encounter: Payer: Self-pay | Admitting: Internal Medicine

## 2012-02-18 DIAGNOSIS — M549 Dorsalgia, unspecified: Secondary | ICD-10-CM

## 2012-02-18 DIAGNOSIS — G629 Polyneuropathy, unspecified: Secondary | ICD-10-CM

## 2012-02-26 ENCOUNTER — Ambulatory Visit (HOSPITAL_BASED_OUTPATIENT_CLINIC_OR_DEPARTMENT_OTHER): Payer: 59

## 2012-02-29 ENCOUNTER — Ambulatory Visit (HOSPITAL_BASED_OUTPATIENT_CLINIC_OR_DEPARTMENT_OTHER)
Admission: RE | Admit: 2012-02-29 | Discharge: 2012-02-29 | Disposition: A | Discharge status: Home / Self Care | Payer: 59 | Source: Ambulatory Visit | Attending: Internal Medicine | Admitting: Internal Medicine

## 2012-02-29 DIAGNOSIS — M549 Dorsalgia, unspecified: Secondary | ICD-10-CM

## 2012-02-29 DIAGNOSIS — G589 Mononeuropathy, unspecified: Secondary | ICD-10-CM | POA: Insufficient documentation

## 2012-02-29 DIAGNOSIS — G629 Polyneuropathy, unspecified: Secondary | ICD-10-CM

## 2012-04-07 ENCOUNTER — Ambulatory Visit (AMBULATORY_SURGERY_CENTER): Payer: 59 | Admitting: *Deleted

## 2012-04-07 VITALS — Ht 62.0 in | Wt 215.0 lb

## 2012-04-07 DIAGNOSIS — Z1211 Encounter for screening for malignant neoplasm of colon: Secondary | ICD-10-CM

## 2012-04-07 MED ORDER — MOVIPREP 100 G PO SOLR: ORAL | Status: DC

## 2012-04-21 ENCOUNTER — Ambulatory Visit (AMBULATORY_SURGERY_CENTER): Payer: 59 | Admitting: Internal Medicine

## 2012-04-21 ENCOUNTER — Encounter: Payer: Self-pay | Admitting: Internal Medicine

## 2012-04-21 VITALS — BP 115/76 | HR 61 | Temp 97.9°F | Resp 19 | Ht 62.0 in | Wt 215.0 lb

## 2012-04-21 DIAGNOSIS — D126 Benign neoplasm of colon, unspecified: Secondary | ICD-10-CM

## 2012-04-21 DIAGNOSIS — Z1211 Encounter for screening for malignant neoplasm of colon: Secondary | ICD-10-CM

## 2012-04-21 MED ORDER — SODIUM CHLORIDE 0.9 % IV SOLN: 500.0000 mL | INTRAVENOUS | Status: DC

## 2012-04-21 NOTE — Op Note (Signed)
[facility] [address] [place], [address]   COLONOSCOPY PROCEDURE REPORT  PATIENT: Sherri Sullivan, Sherri Sullivan.  MR#: [id number] BIRTHDATE: April 09, 1960 , 51  yrs. old GENDER: Female ENDOSCOPIST: Hart Carwin, MD REFERRED BY:  Varney Baas, M.D. , Dr Osei-B0nsu PROCEDURE DATE:  04/21/2012 PROCEDURE:   Colonoscopy with snare polypectomy ASA CLASS:   Class II INDICATIONS:Average risk patient for colon cancer. MEDICATIONS: MAC sedation, administered by CRNA, Propofol (Diprivan), and Propofol (Diprivan) 230 mg IV  DESCRIPTION OF PROCEDURE:   After the risks and benefits and of the procedure were explained, informed consent was obtained.  A digital rectal exam revealed no abnormalities of the rectum.    The LB [id number]  endoscope was introduced through the anus and advanced to the cecum, which was identified by both the appendix and ileocecal valve .  The quality of the prep was good, using MoviPrep .  The instrument was then slowly withdrawn as the colon was fully examined.     COLON FINDINGS: A pedunculated polyp ranging between 5-29mm in size with a friable surface was found in the sigmoid colon.at 30 cm,  A polypectomy was performed with a cold snare.  The resection was complete and the polyp tissue was completely retrieved. Retroflexed views revealed no abnormalities.     The scope was then withdrawn from the patient and the procedure completed.  COMPLICATIONS: There were no complications. ENDOSCOPIC IMPRESSION: Pedunculated polyp ranging between 5-55mm in size was found in the sigmoid colon; polypectomy was performed with a cold snare  RECOMMENDATIONS: 1.  Await pathology results 2.  High fiber diet   REPEAT EXAM: In 5 year(s)  for Colonoscopy.  cc:  _______________________________ eSignedHart Carwin, MD 04/21/2012 12:12 PM     PATIENT NAME:  Sherri Sullivan, Sherri Sullivan. MR#: [id number]

## 2012-04-21 NOTE — Progress Notes (Addendum)
Patient did not have preoperative order for IV antibiotic SSI prophylaxis. (G8918)  Patient did not experience any of the following events: a burn prior to discharge; a fall within the facility; wrong site/side/patient/procedure/implant event; or a hospital transfer or hospital admission upon discharge from the facility. (G8907)  

## 2012-04-21 NOTE — Patient Instructions (Addendum)
YOU HAD AN ENDOSCOPIC PROCEDURE TODAY AT THE [facility]: Refer to the procedure report that was given to you for any specific questions about what was found during the examination.  If the procedure report does not answer your questions, please call your gastroenterologist to clarify.  If you requested that your care partner not be given the details of your procedure findings, then the procedure report has been included in a sealed envelope for you to review at your convenience later.  YOU SHOULD EXPECT: Some feelings of bloating in the abdomen. Passage of more gas than usual.  Walking can help get rid of the air that was put into your GI tract during the procedure and reduce the bloating. If you had a lower endoscopy (such as a colonoscopy or flexible sigmoidoscopy) you may notice spotting of blood in your stool or on the toilet paper. If you underwent a bowel prep for your procedure, then you may not have a normal bowel movement for a few days.  DIET: Your first meal following the procedure should be a light meal and then it is ok to progress to your normal diet.  A half-sandwich or bowl of soup is an example of a good first meal.  Heavy or fried foods are harder to digest and may make you feel nauseous or bloated.  Likewise meals heavy in dairy and vegetables can cause extra gas to form and this can also increase the bloating.  Drink plenty of fluids but you should avoid alcoholic beverages for 24 hours.  ACTIVITY: Your care partner should take you home directly after the procedure.  You should plan to take it easy, moving slowly for the rest of the day.  You can resume normal activity the day after the procedure however you should NOT DRIVE or use heavy machinery for 24 hours (because of the sedation medicines used during the test).    SYMPTOMS TO REPORT IMMEDIATELY: A gastroenterologist can be reached at any hour.  During normal business hours, 8:30 AM to 5:00 PM Monday through Friday,  call [phone].  After hours and on weekends, please call the GI answering service at [phone] who will take a message and have the physician on call contact you.   Following lower endoscopy (colonoscopy or flexible sigmoidoscopy):  Excessive amounts of blood in the stool  Significant tenderness or worsening of abdominal pains  Swelling of the abdomen that is new, acute  Fever of 100F or higher  FOLLOW UP: If any biopsies were taken you will be contacted by phone or by letter within the next 1-3 weeks.  Call your gastroenterologist if you have not heard about the biopsies in 3 weeks.  Our staff will call the home number listed on your records the next business day following your procedure to check on you and address any questions or concerns that you may have at that time regarding the information given to you following your procedure. This is a courtesy call and so if there is no answer at the home number and we have not heard from you through the emergency physician on call, we will assume that you have returned to your regular daily activities without incident.  SIGNATURES/CONFIDENTIALITY: You and/or your care partner have signed paperwork which will be entered into your electronic medical record.  These signatures attest to the fact that that the information above on your After Visit Summary has been reviewed and is understood.  Full responsibility of the confidentiality of this   discharge information lies with you and/or your care-partner.   Thank-you for choosing us for your healthcare needs. 

## 2012-04-24 ENCOUNTER — Telehealth: Payer: Self-pay

## 2012-04-24 NOTE — Telephone Encounter (Signed)
  Follow up Call-  Call back number 04/21/2012  Post procedure Call Back phone  # [phone]  Permission to leave phone message Yes     Patient questions:  Do you have a fever, pain , or abdominal swelling? no Pain Score  0 *  Have you tolerated food without any problems? yes  Have you been able to return to your normal activities? yes  Do you have any questions about your discharge instructions: Diet   no Medications  no Follow up visit  no  Do you have questions or concerns about your Care? no  Actions: * If pain score is 4 or above: No action needed, pain <4.

## 2012-04-26 ENCOUNTER — Encounter: Payer: Self-pay | Admitting: Internal Medicine

## 2012-05-27 LAB — HM COLONOSCOPY

## 2012-11-02 ENCOUNTER — Ambulatory Visit: Payer: 59 | Attending: Orthopedic Surgery

## 2012-11-02 DIAGNOSIS — M545 Low back pain, unspecified: Secondary | ICD-10-CM | POA: Insufficient documentation

## 2012-11-02 DIAGNOSIS — IMO0001 Reserved for inherently not codable concepts without codable children: Secondary | ICD-10-CM | POA: Insufficient documentation

## 2012-11-06 ENCOUNTER — Ambulatory Visit: Payer: 59 | Admitting: Rehabilitation

## 2012-11-09 ENCOUNTER — Ambulatory Visit: Payer: 59

## 2012-11-13 ENCOUNTER — Ambulatory Visit: Payer: 59

## 2012-11-16 ENCOUNTER — Ambulatory Visit: Payer: 59 | Admitting: Rehabilitation

## 2012-11-28 ENCOUNTER — Ambulatory Visit: Payer: 59 | Attending: Orthopedic Surgery | Admitting: Rehabilitation

## 2012-11-28 DIAGNOSIS — IMO0001 Reserved for inherently not codable concepts without codable children: Secondary | ICD-10-CM | POA: Insufficient documentation

## 2012-11-28 DIAGNOSIS — M545 Low back pain, unspecified: Secondary | ICD-10-CM | POA: Insufficient documentation

## 2012-11-30 ENCOUNTER — Ambulatory Visit: Payer: 59 | Admitting: Rehabilitation

## 2012-12-05 ENCOUNTER — Ambulatory Visit: Payer: 59 | Admitting: Rehabilitation

## 2012-12-07 ENCOUNTER — Ambulatory Visit: Payer: 59

## 2012-12-12 ENCOUNTER — Encounter: Payer: 59 | Admitting: Rehabilitation

## 2012-12-14 ENCOUNTER — Encounter: Payer: 59 | Admitting: Rehabilitation

## 2012-12-15 ENCOUNTER — Ambulatory Visit (INDEPENDENT_AMBULATORY_CARE_PROVIDER_SITE_OTHER): Payer: 59 | Admitting: Family Medicine

## 2012-12-15 ENCOUNTER — Telehealth: Payer: Self-pay | Admitting: Family Medicine

## 2012-12-15 ENCOUNTER — Encounter: Payer: Self-pay | Admitting: Family Medicine

## 2012-12-15 VITALS — BP 122/80 | HR 89 | Temp 98.0°F | Ht 62.0 in | Wt 226.0 lb

## 2012-12-15 DIAGNOSIS — I471 Supraventricular tachycardia, unspecified: Secondary | ICD-10-CM

## 2012-12-15 DIAGNOSIS — E785 Hyperlipidemia, unspecified: Secondary | ICD-10-CM

## 2012-12-15 DIAGNOSIS — D259 Leiomyoma of uterus, unspecified: Secondary | ICD-10-CM

## 2012-12-15 DIAGNOSIS — E669 Obesity, unspecified: Secondary | ICD-10-CM

## 2012-12-15 DIAGNOSIS — J309 Allergic rhinitis, unspecified: Secondary | ICD-10-CM

## 2012-12-15 DIAGNOSIS — M549 Dorsalgia, unspecified: Secondary | ICD-10-CM

## 2012-12-15 DIAGNOSIS — I10 Essential (primary) hypertension: Secondary | ICD-10-CM

## 2012-12-15 DIAGNOSIS — Z0000 Encounter for general adult medical examination without abnormal findings: Secondary | ICD-10-CM

## 2012-12-15 DIAGNOSIS — I498 Other specified cardiac arrhythmias: Secondary | ICD-10-CM

## 2012-12-15 MED ORDER — CYCLOBENZAPRINE HCL 5 MG PO TABS: 5.0000 mg | ORAL_TABLET | Freq: Three times a day (TID) | ORAL | Status: DC | PRN

## 2012-12-15 NOTE — Telephone Encounter (Signed)
LAB ORDER WEEK OF 04-02-2013  Next visit, labs prior to visit, cbc, tsh, lipid, renal, hepatic

## 2012-12-15 NOTE — Patient Instructions (Addendum)
Digestive Advantage caps or a generic probiotic daily  DASH Diet The DASH diet stands for "Dietary Approaches to Stop Hypertension." It is a healthy eating plan that has been shown to reduce high blood pressure (hypertension) in as little as 14 days, while also possibly providing other significant health benefits. These other health benefits include reducing the risk of breast cancer after menopause and reducing the risk of type 2 diabetes, heart disease, colon cancer, and stroke. Health benefits also include weight loss and slowing kidney failure in patients with chronic kidney disease.  DIET GUIDELINES  Limit salt (sodium). Your diet should contain less than 1500 mg of sodium daily.  Limit refined or processed carbohydrates. Your diet should include mostly whole grains. Desserts and added sugars should be used sparingly.  Include small amounts of heart-healthy fats. These types of fats include nuts, oils, and tub margarine. Limit saturated and trans fats. These fats have been shown to be harmful in the body. CHOOSING FOODS  The following food groups are based on a 2000 calorie diet. See your Registered Dietitian for individual calorie needs. Grains and Grain Products (6 to 8 servings daily)  Eat More Often: Whole-wheat bread, brown rice, whole-grain or wheat pasta, quinoa, popcorn without added fat or salt (air popped).  Eat Less Often: White bread, white pasta, white rice, cornbread. Vegetables (4 to 5 servings daily)  Eat More Often: Fresh, frozen, and canned vegetables. Vegetables may be raw, steamed, roasted, or grilled with a minimal amount of fat.  Eat Less Often/Avoid: Creamed or fried vegetables. Vegetables in a cheese sauce. Fruit (4 to 5 servings daily)  Eat More Often: All fresh, canned (in natural juice), or frozen fruits. Dried fruits without added sugar. One hundred percent fruit juice ( cup [237 mL] daily).  Eat Less Often: Dried fruits with added sugar. Canned fruit in  light or heavy syrup. [employer], Fish, and Poultry (2 servings or less daily. One serving is 3 to 4 oz [85-114 g]).  Eat More Often: Ninety percent or leaner ground beef, tenderloin, sirloin. Round cuts of beef, chicken breast, [place] breast. All fish. Grill, bake, or broil your meat. Nothing should be fried.  Eat Less Often/Avoid: Fatty cuts of meat, [place], or chicken leg, thigh, or wing. Fried cuts of meat or fish. Dairy (2 to 3 servings)  Eat More Often: Low-fat or fat-free milk, low-fat plain or light yogurt, reduced-fat or part-skim cheese.  Eat Less Often/Avoid: Milk (whole, 2%).Whole milk yogurt. Full-fat cheeses. Nuts, Seeds, and Legumes (4 to 5 servings per week)  Eat More Often: All without added salt.  Eat Less Often/Avoid: Salted nuts and seeds, canned beans with added salt. Fats and Sweets (limited)  Eat More Often: Vegetable oils, tub margarines without trans fats, sugar-free gelatin. Mayonnaise and salad dressings.  Eat Less Often/Avoid: Coconut oils, palm oils, butter, stick margarine, cream, half and half, cookies, candy, pie. FOR MORE INFORMATION The Dash Diet Eating Plan: www.dashdiet.org Document Released: 03/04/2011 Document Revised: 06/07/2011 Document Reviewed: 03/04/2011 [facility] Patient Information 2014 [place], [place].

## 2012-12-17 ENCOUNTER — Encounter: Payer: Self-pay | Admitting: Family Medicine

## 2012-12-17 DIAGNOSIS — D259 Leiomyoma of uterus, unspecified: Secondary | ICD-10-CM

## 2012-12-17 DIAGNOSIS — I10 Essential (primary) hypertension: Secondary | ICD-10-CM | POA: Insufficient documentation

## 2012-12-17 HISTORY — DX: Leiomyoma of uterus, unspecified: D25.9

## 2012-12-17 NOTE — Assessment & Plan Note (Signed)
Using Flexeril and Celebrex with some improvement. Encouraged same, attempts at modest weight loss.

## 2012-12-17 NOTE — Assessment & Plan Note (Signed)
Well-controlled on current meds 

## 2012-12-17 NOTE — Assessment & Plan Note (Signed)
Encouraged DASH diet, probiotics, increase activity as tolerated.

## 2012-12-17 NOTE — Progress Notes (Signed)
Patient ID: Sherri Sullivan, female   DOB: 12/18/1960, 52 y.o.   MRN: [id number] Sherri Sullivan [id number] Apr 11, 1960 12/17/2012      Progress Note New Patient  Subjective  Chief Complaint  Chief Complaint  Patient presents with  . Establish Care    new patient    HPI  Patient is a 52 year old African American female who is in today to establish care. She is not complaining of any acute concerns at present. No recent illness. No chest pain or palpitations. She has chronic back and knee pain but [facility]. His partial relief with her Celebrex and Flexeril. [facility] and has no concerns year. No recent illness. No fevers or headaches. No chest pain, palpitations, shortness or breath, GI or GU complaints.  Past Medical History  Diagnosis Date  . Dizziness   . HTN (hypertension)   . SVT (supraventricular tachycardia)   . Anginal pain   . SOB (shortness of breath) 09/23/11    "a little bit; at rest; before ablation"  . Chicken pox as a child  . Measles as a child  . Measles as a child  . Obesity     Past Surgical History  Procedure Laterality Date  . Cardiac electrophysiology study and ablation  09/23/11  . Abdominal hysterectomy  1990's  . Tubal ligation  1980's    Family History  Problem Relation Age of Onset  . Cancer Sister     lung  . Diabetes Maternal Aunt   . Kidney disease Maternal Aunt   . Diabetes Mother     type 2  . Heart disease Mother   . Heart disease Father   . Diabetes Maternal Uncle   . Hypertension Daughter   . Stroke Maternal Grandmother     History   Social History  . Marital Status: Married    Spouse Name: N/A    Number of Children: N/A  . Years of Education: N/A   Occupational History  . Not on file.   Social History Main Topics  . Smoking status: Former Smoker -- 0.12 packs/day for 8 years    Types: Cigarettes    Quit date: 03/29/1984  .  Smokeless tobacco: Never Used  . Alcohol Use: 8.4 oz/week    14 Glasses of wine per week  . Drug Use: No  . Sexual Activity: Not Currently   Other Topics Concern  . Not on file   Social History Narrative  . No narrative on file    Current Outpatient Prescriptions on File Prior to Visit  Medication Sig Dispense Refill  . aspirin 81 MG tablet Take 81 mg by mouth daily.      . cetirizine (ZYRTEC) 10 MG tablet Take 10 mg by mouth daily.      . chlorthalidone (HYGROTON) 25 MG tablet Take 25 mg by mouth daily.      Marland Kitchen estrogens, conjugated, (PREMARIN) 0.625 MG tablet Take 0.625 mg by mouth daily. Take daily for 21 days then do not take for 7 days.      . Multiple Vitamins-Minerals (MULTIVITAMIN WITH MINERALS) tablet Take 1 tablet by mouth 3 (three) times a week.      . naproxen (NAPROSYN) 500 MG tablet Take 500 mg by mouth 2 (two) times daily with a meal.      . OMEGA-3 KRILL OIL PO Take 353 mg by mouth daily. Mega red joint care      .  potassium chloride SA (K-DUR,KLOR-CON) 20 MEQ tablet Take 20 mEq by mouth 2 (two) times daily.       No current facility-administered medications on file prior to visit.    No Known Allergies  Review of Systems  Review of Systems  Constitutional: Negative for fever, chills and malaise/fatigue.  HENT: Negative for hearing loss, nosebleeds and congestion.   Eyes: Negative for discharge.  Respiratory: Negative for cough, sputum production, shortness of breath and wheezing.   Cardiovascular: Negative for chest pain, palpitations and leg swelling.  Gastrointestinal: Negative for heartburn, nausea, vomiting, abdominal pain, diarrhea, constipation and blood in stool.  Genitourinary: Negative for dysuria, urgency, frequency and hematuria.  Musculoskeletal: Positive for back pain. Negative for myalgias and falls.  Skin: Negative for rash.  Neurological: Negative for dizziness, tremors, sensory change, focal weakness, loss of consciousness, weakness and  headaches.  Endo/Heme/Allergies: Negative for polydipsia. Does not bruise/bleed easily.  Psychiatric/Behavioral: Negative for depression and suicidal ideas. The patient is not nervous/anxious and does not have insomnia.     Objective  BP 122/80  Pulse 89  Temp(Src) 98 F (36.7 C) (Oral)  Ht 5\' 2"  (1.575 m)  Wt 226 lb (102.513 kg)  BMI 41.33 kg/m2  SpO2 96%  Physical Exam  Physical Exam  Constitutional: She is oriented to person, place, and time and well-developed, well-nourished, and in no distress. No distress.  HENT:  Head: Normocephalic and atraumatic.  Right Ear: External ear normal.  Left Ear: External ear normal.  Nose: Nose normal.  Mouth/Throat: Oropharynx is clear and moist. No oropharyngeal exudate.  Eyes: Conjunctivae are normal. Pupils are equal, round, and reactive to light. Right eye exhibits no discharge. Left eye exhibits no discharge. No scleral icterus.  Neck: Normal range of motion. Neck supple. No thyromegaly present.  Cardiovascular: Normal rate, regular rhythm, normal heart sounds and intact distal pulses.   No murmur heard. Pulmonary/Chest: Effort normal and breath sounds normal. No respiratory distress. She has no wheezes. She has no rales.  Abdominal: Soft. Bowel sounds are normal. She exhibits no distension and no mass. There is no tenderness.  Musculoskeletal: Normal range of motion. She exhibits no edema and no tenderness.  Lymphadenopathy:    She has no cervical adenopathy.  Neurological: She is alert and oriented to person, place, and time. She has normal reflexes. No cranial nerve deficit. Coordination normal.  Skin: Skin is warm and dry. No rash noted. She is not diaphoretic.  Psychiatric: Mood, memory and affect normal.       Assessment & Plan  SVT (supraventricular tachycardia) No recent symptomatic episodes. Doing well  BACK PAIN Using Flexeril and Celebrex with some improvement. Encouraged same, attempts at modest weight loss.    HYPERLIPIDEMIA Avoid trans fats, add krill oil obtain old records and a lipid panel will be drawn prior to next visit.  Obesity Encouraged DASH diet, probiotics, increase activity as tolerated.  ALLERGIC RHINITIS Well controlled on current meds.  HTN (hypertension) Well controlled on current meds no changes

## 2012-12-17 NOTE — Assessment & Plan Note (Signed)
Avoid trans fats, add krill oil obtain old records and a lipid panel will be drawn prior to next visit.

## 2012-12-17 NOTE — Assessment & Plan Note (Signed)
No recent symptomatic episodes. Doing well

## 2012-12-17 NOTE — Assessment & Plan Note (Signed)
Well controlled on current meds no changes 

## 2013-04-03 ENCOUNTER — Telehealth: Payer: Self-pay | Admitting: Family Medicine

## 2013-04-03 LAB — LIPID PANEL
Cholesterol: 225 mg/dL — ABNORMAL HIGH (ref 0–200)
HDL: 60 mg/dL (ref >39)
LDL Cholesterol: 138 mg/dL — ABNORMAL HIGH (ref 0–99)
Total CHOL/HDL Ratio: 3.8 Ratio
Triglycerides: 134 mg/dL (ref <150)
VLDL: 27 mg/dL (ref 0–40)

## 2013-04-03 LAB — RENAL FUNCTION PANEL
Albumin: 4 g/dL (ref 3.5–5.2)
BUN: 15 mg/dL (ref 6–23)
CO2: 29 mEq/L (ref 19–32)
Calcium: 8.9 mg/dL (ref 8.4–10.5)
Chloride: 102 mEq/L (ref 96–112)
Creat: 0.65 mg/dL (ref 0.50–1.10)
Glucose, Bld: 93 mg/dL (ref 70–99)
Phosphorus: 3.2 mg/dL (ref 2.3–4.6)
Potassium: 3.4 mEq/L — ABNORMAL LOW (ref 3.5–5.3)
Sodium: 140 mEq/L (ref 135–145)

## 2013-04-03 LAB — CBC
HCT: 37.9 % (ref 36.0–46.0)
Hemoglobin: 13.2 g/dL (ref 12.0–15.0)
MCH: 29.6 pg (ref 26.0–34.0)
MCHC: 34.8 g/dL (ref 30.0–36.0)
MCV: 85 fL (ref 78.0–100.0)
Platelets: 250 10*3/uL (ref 150–400)
RBC: 4.46 MIL/uL (ref 3.87–5.11)
RDW: 16.1 % — ABNORMAL HIGH (ref 11.5–15.5)
WBC: 5.5 10*3/uL (ref 4.0–10.5)

## 2013-04-03 LAB — HEPATIC FUNCTION PANEL
ALT: 12 U/L (ref 0–35)
AST: 14 U/L (ref 0–37)
Albumin: 4 g/dL (ref 3.5–5.2)
Alkaline Phosphatase: 52 U/L (ref 39–117)
Bilirubin, Direct: 0.1 mg/dL (ref 0.0–0.3)
Indirect Bilirubin: 0.7 mg/dL (ref 0.0–0.9)
Total Bilirubin: 0.8 mg/dL (ref 0.3–1.2)
Total Protein: 6.7 g/dL (ref 6.0–8.3)

## 2013-04-03 MED ORDER — CHLORTHALIDONE 25 MG PO TABS: 25.0000 mg | ORAL_TABLET | Freq: Every day | ORAL | Status: DC

## 2013-04-03 NOTE — Telephone Encounter (Signed)
Patient is requesting a new prescription of chlorthalidone to be sent to [facility]

## 2013-04-04 LAB — TSH: TSH: 1.097 u[IU]/mL (ref 0.350–4.500)

## 2013-04-12 ENCOUNTER — Encounter: Payer: Self-pay | Admitting: Family Medicine

## 2013-04-12 ENCOUNTER — Ambulatory Visit (INDEPENDENT_AMBULATORY_CARE_PROVIDER_SITE_OTHER): Payer: 59 | Admitting: Family Medicine

## 2013-04-12 VITALS — BP 122/82 | HR 81 | Temp 98.1°F | Ht 62.0 in | Wt 223.0 lb

## 2013-04-12 DIAGNOSIS — J209 Acute bronchitis, unspecified: Secondary | ICD-10-CM

## 2013-04-12 DIAGNOSIS — I471 Supraventricular tachycardia, unspecified: Secondary | ICD-10-CM

## 2013-04-12 DIAGNOSIS — I498 Other specified cardiac arrhythmias: Secondary | ICD-10-CM

## 2013-04-12 DIAGNOSIS — I10 Essential (primary) hypertension: Secondary | ICD-10-CM

## 2013-04-12 DIAGNOSIS — Z23 Encounter for immunization: Secondary | ICD-10-CM

## 2013-04-12 DIAGNOSIS — E785 Hyperlipidemia, unspecified: Secondary | ICD-10-CM

## 2013-04-12 MED ORDER — HYDROCODONE-HOMATROPINE 5-1.5 MG/5ML PO SYRP: 5.0000 mL | ORAL_SOLUTION | Freq: Every evening | ORAL | Status: DC | PRN

## 2013-04-12 MED ORDER — CIPROFLOXACIN HCL 500 MG PO TABS: 500.0000 mg | ORAL_TABLET | Freq: Two times a day (BID) | ORAL | Status: DC

## 2013-04-12 NOTE — Patient Instructions (Signed)
Salon pas patches as needed   Cholesterol Cholesterol is a white, waxy, fat-like protein needed by your body in small amounts. The liver makes all the cholesterol you need. It is carried from the liver by the blood through the blood vessels. Deposits (plaque) may build up on blood vessel walls. This makes the arteries narrower and stiffer. Plaque increases the risk for heart attack and stroke. You cannot feel your cholesterol level even if it is very high. The only way to know is by a blood test to check your lipid (fats) levels. Once you know your cholesterol levels, you should keep a record of the test results. Work with your caregiver to to keep your levels in the desired range. WHAT THE RESULTS MEAN:  Total cholesterol is a rough measure of all the cholesterol in your blood.  LDL is the so-called bad cholesterol. This is the type that deposits cholesterol in the walls of the arteries. You want this level to be low.  HDL is the good cholesterol because it cleans the arteries and carries the LDL away. You want this level to be high.  Triglycerides are fat that the body can either burn for energy or store. High levels are closely linked to heart disease. DESIRED LEVELS:  Total cholesterol below 200.  LDL below 100 for people at risk, below 70 for very high risk.  HDL above 50 is good, above 60 is best.  Triglycerides below 150. HOW TO LOWER YOUR CHOLESTEROL:  Diet.  Choose fish or white meat chicken and [place], roasted or baked. Limit fatty cuts of red meat, fried foods, and processed meats, such as sausage and lunch meat.  Eat lots of fresh fruits and vegetables. Choose whole grains, beans, pasta, potatoes and cereals.  Use only small amounts of olive, corn or canola oils. Avoid butter, mayonnaise, shortening or palm kernel oils. Avoid foods with trans-fats.  Use skim/nonfat milk and low-fat/nonfat yogurt and cheeses. Avoid whole milk, cream, ice cream, egg yolks and cheeses.  Healthy desserts include angel food cake, ginger snaps, animal crackers, hard candy, popsicles, and low-fat/nonfat frozen yogurt. Avoid pastries, cakes, pies and cookies.  Exercise.  A regular program helps decrease LDL and raises HDL.  Helps with weight control.  Do things that increase your activity level like gardening, walking, or taking the stairs.  Medication.  May be prescribed by your caregiver to help lowering cholesterol and the risk for heart disease.  You may need medicine even if your levels are normal if you have several risk factors. HOME CARE INSTRUCTIONS   Follow your diet and exercise programs as suggested by your caregiver.  Take medications as directed.  Have blood work done when your caregiver feels it is necessary. MAKE SURE YOU:   Understand these instructions.  Will watch your condition.  Will get help right away if you are not doing well or get worse. Document Released: 12/08/2000 Document Revised: 06/07/2011 Document Reviewed: 12/27/2012 [facility] Patient Information 2014 [place], [place].

## 2013-04-12 NOTE — Progress Notes (Signed)
Pre visit review using our clinic review tool, if applicable. No additional management support is needed unless otherwise documented below in the visit note. 

## 2013-04-15 ENCOUNTER — Encounter: Payer: Self-pay | Admitting: Family Medicine

## 2013-04-15 DIAGNOSIS — J209 Acute bronchitis, unspecified: Secondary | ICD-10-CM | POA: Insufficient documentation

## 2013-04-15 NOTE — Assessment & Plan Note (Signed)
Well controlled, no changes 

## 2013-04-15 NOTE — Progress Notes (Signed)
Patient ID: Sherri Sullivan, female   DOB: 10-25-1960, 53 y.o.   MRN: [id number] Sherri Sullivan [id number] 11-17-1960 04/15/2013      Progress Note-Follow Up  Subjective  Chief Complaint  Chief Complaint  Patient presents with  . Follow-up    4 month  . Injections    prevnar    HPI  Patient is a 53 year old female who is in today in followup. She's been struggling with congestion and cough off and on for almost 2 months now. Cough is worse at night. She's had congestion and occasionally her cough is productive of yellow phlegm. Her ears are itchy and full. No fevers or chills. No reflux. She struggling with her eyes and is following with an ophthalmologist. [facility]i [facility]. Has tried Mucinex and Robitussin over-the-counter with no great relief.  Past Medical History  Diagnosis Date  . Dizziness   . HTN (hypertension)   . SVT (supraventricular tachycardia)   . Anginal pain   . SOB (shortness of breath) 09/23/11    "a little bit; at rest; before ablation"  . Chicken pox as a child  . Measles as a child  . Measles as a child  . Obesity   . Uterine fibroid 12/17/2012    Cervical polyp per patient Follows with [facility], Dr Evette Cristal    Past Surgical History  Procedure Laterality Date  . Cardiac electrophysiology study and ablation  09/23/11  . Abdominal hysterectomy  1990's  . Tubal ligation  1980's    Family History  Problem Relation Age of Onset  . Cancer Sister     lung  . Diabetes Maternal Aunt   . Kidney disease Maternal Aunt   . Diabetes Mother     type 2  . Heart disease Mother   . Heart disease Father   . Diabetes Maternal Uncle   . Hypertension Daughter   . Stroke Maternal Grandmother     History   Social History  . Marital Status: Married    Spouse Name: N/A    Number of Children: N/A  . Years of Education: N/A   Occupational History  . Not on file.   Social History Main Topics  . Smoking status: Former  Smoker -- 0.12 packs/day for 8 years    Types: Cigarettes    Quit date: 03/29/1984  . Smokeless tobacco: Never Used  . Alcohol Use: 8.4 oz/week    14 Glasses of wine per week  . Drug Use: No  . Sexual Activity: Not Currently   Other Topics Concern  . Not on file   Social History Narrative  . No narrative on file    Current Outpatient Prescriptions on File Prior to Visit  Medication Sig Dispense Refill  . aspirin 81 MG tablet Take 81 mg by mouth daily.      . cetirizine (ZYRTEC) 10 MG tablet Take 10 mg by mouth daily.      . chlorthalidone (HYGROTON) 25 MG tablet Take 1 tablet (25 mg total) by mouth daily.  30 tablet  3  . cyclobenzaprine (FLEXERIL) 5 MG tablet Take 1 tablet (5 mg total) by mouth 3 (three) times daily as needed. For muscle pain  60 tablet  3  . estrogens, conjugated, (PREMARIN) 0.625 MG tablet Take 0.625 mg by mouth daily. Take daily for 21 days then do not take for 7 days.      . Multiple Vitamins-Minerals (MULTIVITAMIN WITH MINERALS) tablet Take 1 tablet  by mouth 3 (three) times a week.      . naproxen (NAPROSYN) 500 MG tablet Take 500 mg by mouth 2 (two) times daily with a meal.      . OMEGA-3 KRILL OIL PO Take 353 mg by mouth daily. Mega red joint care      . potassium chloride SA (K-DUR,KLOR-CON) 20 MEQ tablet Take 20 mEq by mouth 2 (two) times daily.       No current facility-administered medications on file prior to visit.    No Known Allergies  Review of Systems  Review of Systems  Constitutional: Positive for malaise/fatigue. Negative for fever.  HENT: Positive for congestion.   Eyes: Negative for discharge.  Respiratory: Positive for cough and sputum production. Negative for shortness of breath.   Cardiovascular: Negative for chest pain, palpitations and leg swelling.  Gastrointestinal: Negative for nausea, abdominal pain and diarrhea.  Genitourinary: Negative for dysuria.  Musculoskeletal: Negative for falls.  Skin: Negative for rash.   Neurological: Positive for headaches. Negative for loss of consciousness.  Endo/Heme/Allergies: Negative for polydipsia.  Psychiatric/Behavioral: Negative for depression and suicidal ideas. The patient is not nervous/anxious and does not have insomnia.     Objective  BP 122/82  Pulse 81  Temp(Src) 98.1 F (36.7 C) (Oral)  Ht 5\' 2"  (1.575 m)  Wt 223 lb 0.6 oz (101.17 kg)  BMI 40.78 kg/m2  SpO2 96%  Physical Exam  Physical Exam  Constitutional: She is oriented to person, place, and time and well-developed, well-nourished, and in no distress. No distress.  HENT:  Head: Normocephalic and atraumatic.  Eyes: Conjunctivae are normal.  Neck: Neck supple. No thyromegaly present.  Cardiovascular: Normal rate, regular rhythm and normal heart sounds.   No murmur heard. Pulmonary/Chest: Effort normal and breath sounds normal. She has no wheezes.  Abdominal: She exhibits no distension and no mass.  Musculoskeletal: She exhibits no edema.  Lymphadenopathy:    She has no cervical adenopathy.  Neurological: She is alert and oriented to person, place, and time.  Skin: Skin is warm and dry. No rash noted. She is not diaphoretic.  Psychiatric: Memory, affect and judgment normal.    Lab Results  Component Value Date   TSH 1.097 04/03/2013   Lab Results  Component Value Date   WBC 5.5 04/03/2013   HGB 13.2 04/03/2013   HCT 37.9 04/03/2013   MCV 85.0 04/03/2013   PLT 250 04/03/2013   Lab Results  Component Value Date   CREATININE 0.65 04/03/2013   BUN 15 04/03/2013   NA 140 04/03/2013   K 3.4* 04/03/2013   CL 102 04/03/2013   CO2 29 04/03/2013   Lab Results  Component Value Date   ALT 12 04/03/2013   AST 14 04/03/2013   ALKPHOS 52 04/03/2013   BILITOT 0.8 04/03/2013   Lab Results  Component Value Date   CHOL 225* 04/03/2013   Lab Results  Component Value Date   HDL 60 04/03/2013   Lab Results  Component Value Date   LDLCALC 138* 04/03/2013   Lab Results  Component Value Date   TRIG 134 04/03/2013    Lab Results  Component Value Date   CHOLHDL 3.8 04/03/2013     Assessment & Plan  HTN (hypertension) Well controlled, no changes  SVT (supraventricular tachycardia) Rate controlled today  Acute bronchitis Started on Ciprofloxacin and mucinex and probiotics  HYPERLIPIDEMIA Avoid trans fats, increase exercise continue krill oil caps daily

## 2013-04-15 NOTE — Assessment & Plan Note (Signed)
Avoid trans fats, increase exercise continue krill oil caps daily

## 2013-04-15 NOTE — Assessment & Plan Note (Signed)
Started on Ciprofloxacin and mucinex and probiotics

## 2013-04-15 NOTE — Assessment & Plan Note (Signed)
Rate controlled today. 

## 2013-06-28 ENCOUNTER — Telehealth: Payer: Self-pay | Admitting: Family Medicine

## 2013-06-28 MED ORDER — POTASSIUM CHLORIDE CRYS ER 20 MEQ PO TBCR: 20.0000 meq | EXTENDED_RELEASE_TABLET | Freq: Two times a day (BID) | ORAL | Status: DC

## 2013-06-28 MED ORDER — CELECOXIB 200 MG PO CAPS: 200.0000 mg | ORAL_CAPSULE | Freq: Two times a day (BID) | ORAL | Status: DC | PRN

## 2013-06-28 NOTE — Telephone Encounter (Signed)
Refill- celebrex   Refill- potassium

## 2013-08-02 ENCOUNTER — Other Ambulatory Visit: Payer: Self-pay | Admitting: Family Medicine

## 2013-08-17 ENCOUNTER — Ambulatory Visit (INDEPENDENT_AMBULATORY_CARE_PROVIDER_SITE_OTHER): Payer: 59 | Admitting: Family Medicine

## 2013-08-17 ENCOUNTER — Encounter: Payer: Self-pay | Admitting: Family Medicine

## 2013-08-17 VITALS — BP 129/83 | HR 90 | Temp 98.2°F | Resp 18 | Ht 62.0 in | Wt 227.0 lb

## 2013-08-17 DIAGNOSIS — I10 Essential (primary) hypertension: Secondary | ICD-10-CM

## 2013-08-17 DIAGNOSIS — R109 Unspecified abdominal pain: Secondary | ICD-10-CM

## 2013-08-17 DIAGNOSIS — R350 Frequency of micturition: Secondary | ICD-10-CM | POA: Insufficient documentation

## 2013-08-17 DIAGNOSIS — I471 Supraventricular tachycardia: Secondary | ICD-10-CM

## 2013-08-17 DIAGNOSIS — I498 Other specified cardiac arrhythmias: Secondary | ICD-10-CM

## 2013-08-17 DIAGNOSIS — M549 Dorsalgia, unspecified: Secondary | ICD-10-CM

## 2013-08-17 LAB — CBC
HCT: 38.6 % (ref 36.0–46.0)
Hemoglobin: 13.2 g/dL (ref 12.0–15.0)
MCH: 28.5 pg (ref 26.0–34.0)
MCHC: 34.2 g/dL (ref 30.0–36.0)
MCV: 83.4 fL (ref 78.0–100.0)
Platelets: 258 10*3/uL (ref 150–400)
RBC: 4.63 MIL/uL (ref 3.87–5.11)
RDW: 15.8 % — ABNORMAL HIGH (ref 11.5–15.5)
WBC: 7.1 10*3/uL (ref 4.0–10.5)

## 2013-08-17 LAB — POCT URINALYSIS DIPSTICK
Bilirubin, UA: NEGATIVE
Glucose, UA: NEGATIVE
Ketones, UA: NEGATIVE
Leukocytes, UA: NEGATIVE
Nitrite, UA: NEGATIVE
Spec Grav, UA: 1.025
Urobilinogen, UA: 0.2
pH, UA: 5

## 2013-08-17 MED ORDER — NITROFURANTOIN MONOHYD MACRO 100 MG PO CAPS: 100.0000 mg | ORAL_CAPSULE | Freq: Two times a day (BID) | ORAL | Status: DC

## 2013-08-17 NOTE — Progress Notes (Signed)
Pre visit review using our clinic review tool, if applicable. No additional management support is needed unless otherwise documented below in the visit note. 

## 2013-08-17 NOTE — Assessment & Plan Note (Signed)
Improved on recheck.  

## 2013-08-17 NOTE — Progress Notes (Signed)
Patient ID: Sherri Sullivan, female   DOB: 1960-06-28, 53 y.o.   MRN: [id number] Sherri Sullivan [id number] Jul 30, 1960 08/17/2013      Progress Note-Follow Up  Subjective  Chief Complaint  Chief Complaint  Patient presents with  . Urinary Frequency  . Back Pain    HPI  Patient is a 53 year old female in today for routine medical care. He is in today complaining of a roughly one-month history of intermittent urinary frequency, urgency and incontinence. No dysuria but she has had some lower abdominal pain most notably on the left side. It is worse with acidic movements. No changes in bowel habits, bloody or tarry stool. No fevers or chills. No other recent illness. Denies CP/palp/SOB/HA/congestion/fevers/GI c/o. Taking meds as prescribed  Past Medical History  Diagnosis Date  . Dizziness   . HTN (hypertension)   . SVT (supraventricular tachycardia)   . Anginal pain   . SOB (shortness of breath) 09/23/11    "a little bit; at rest; before ablation"  . Chicken pox as a child  . Measles as a child  . Measles as a child  . Obesity   . Uterine fibroid 12/17/2012    Cervical polyp per patient Follows with [facility], Dr Evette Cristal    Past Surgical History  Procedure Laterality Date  . Cardiac electrophysiology study and ablation  09/23/11  . Abdominal hysterectomy  1990's  . Tubal ligation  1980's    Family History  Problem Relation Age of Onset  . Cancer Sister     lung  . Diabetes Maternal Aunt   . Kidney disease Maternal Aunt   . Diabetes Mother     type 2  . Heart disease Mother   . Heart disease Father   . Diabetes Maternal Uncle   . Hypertension Daughter   . Stroke Maternal Grandmother     History   Social History  . Marital Status: Married    Spouse Name: N/A    Number of Children: N/A  . Years of Education: N/A   Occupational History  . Not on file.   Social History Main Topics  . Smoking status: Former Smoker -- 0.12 packs/day for 8 years   Types: Cigarettes    Quit date: 03/29/1984  . Smokeless tobacco: Never Used  . Alcohol Use: 8.4 oz/week    14 Glasses of wine per week  . Drug Use: No  . Sexual Activity: Not Currently   Other Topics Concern  . Not on file   Social History Narrative  . No narrative on file    Current Outpatient Prescriptions on File Prior to Visit  Medication Sig Dispense Refill  . aspirin 81 MG tablet Take 81 mg by mouth daily.      . celecoxib (CELEBREX) 200 MG capsule Take 1 capsule (200 mg total) by mouth 2 (two) times daily as needed.  60 capsule  4  . cetirizine (ZYRTEC) 10 MG tablet Take 10 mg by mouth daily.      . chlorthalidone (HYGROTON) 25 MG tablet TAKE 1 TABLET (25 MG TOTAL) BY MOUTH DAILY.  30 tablet  3  . ciprofloxacin (CIPRO) 500 MG tablet Take 1 tablet (500 mg total) by mouth 2 (two) times daily.  14 tablet  0  . cyclobenzaprine (FLEXERIL) 5 MG tablet Take 1 tablet (5 mg total) by mouth 3 (three) times daily as needed. For muscle pain  60 tablet  3  . estrogens, conjugated, (PREMARIN) 0.625 MG tablet  Take 0.625 mg by mouth daily. Take daily for 21 days then do not take for 7 days.      Marland Kitchen HYDROcodone-homatropine (HYCODAN) 5-1.5 MG/5ML syrup Take 5 mLs by mouth at bedtime as needed for cough.  120 mL  0  . Multiple Vitamins-Minerals (MULTIVITAMIN WITH MINERALS) tablet Take 1 tablet by mouth 3 (three) times a week.      . naproxen (NAPROSYN) 500 MG tablet Take 500 mg by mouth 2 (two) times daily with a meal.      . OMEGA-3 KRILL OIL PO Take 353 mg by mouth daily. Mega red joint care      . potassium chloride SA (K-DUR,KLOR-CON) 20 MEQ tablet Take 1 tablet (20 mEq total) by mouth 2 (two) times daily.  60 tablet  4   No current facility-administered medications on file prior to visit.    No Known Allergies  Review of Systems  Review of Systems  Constitutional: Negative for fever and malaise/fatigue.  HENT: Negative for congestion.   Eyes: Negative for discharge.  Respiratory:  Negative for shortness of breath.   Cardiovascular: Negative for chest pain, palpitations and leg swelling.  Gastrointestinal: Negative for nausea, abdominal pain and diarrhea.  Genitourinary: Negative for dysuria.  Musculoskeletal: Negative for falls.  Skin: Negative for rash.  Neurological: Negative for loss of consciousness and headaches.  Endo/Heme/Allergies: Negative for polydipsia.  Psychiatric/Behavioral: Negative for depression and suicidal ideas. The patient is not nervous/anxious and does not have insomnia.     Objective  BP 129/83  Pulse 90  Temp(Src) 98.2 F (36.8 C) (Oral)  Resp 18  Ht 5\' 2"  (1.575 m)  Wt 227 lb (102.967 kg)  BMI 41.51 kg/m2  SpO2 98%  Physical Exam  Physical Exam  Constitutional: She is oriented to person, place, and time and well-developed, well-nourished, and in no distress. No distress.  HENT:  Head: Normocephalic and atraumatic.  Eyes: Conjunctivae are normal.  Neck: Neck supple. No thyromegaly present.  Cardiovascular: Normal rate, regular rhythm and normal heart sounds.   No murmur heard. Pulmonary/Chest: Effort normal and breath sounds normal. She has no wheezes.  Abdominal: She exhibits no distension and no mass.  Musculoskeletal: She exhibits no edema.  Lymphadenopathy:    She has no cervical adenopathy.  Neurological: She is alert and oriented to person, place, and time.  Skin: Skin is warm and dry. No rash noted. She is not diaphoretic.  Psychiatric: Memory, affect and judgment normal.    Lab Results  Component Value Date   TSH 1.097 04/03/2013   Lab Results  Component Value Date   WBC 5.5 04/03/2013   HGB 13.2 04/03/2013   HCT 37.9 04/03/2013   MCV 85.0 04/03/2013   PLT 250 04/03/2013   Lab Results  Component Value Date   CREATININE 0.65 04/03/2013   BUN 15 04/03/2013   NA 140 04/03/2013   K 3.4* 04/03/2013   CL 102 04/03/2013   CO2 29 04/03/2013   Lab Results  Component Value Date   ALT 12 04/03/2013   AST 14 04/03/2013   ALKPHOS 52  04/03/2013   BILITOT 0.8 04/03/2013   Lab Results  Component Value Date   CHOL 225* 04/03/2013   Lab Results  Component Value Date   HDL 60 04/03/2013   Lab Results  Component Value Date   LDLCALC 138* 04/03/2013   Lab Results  Component Value Date   TRIG 134 04/03/2013   Lab Results  Component Value Date   CHOLHDL 3.8  04/03/2013     Assessment & Plan  HTN (hypertension) Well controlled, no changes to meds. Encouraged heart healthy diet such as the DASH diet and exercise as tolerated.   BACK PAIN Encouraged moist heat and gentle stretching as tolerated. May try NSAIDs as directed and report if symptoms worsen or seek immediate care. Try Salon pas patches  SVT (supraventricular tachycardia) Improved on recheck

## 2013-08-17 NOTE — Patient Instructions (Addendum)
Cranberry tabs Digestive Advantage gummies Salon pas or icy hot patches Moist heat and gentle stretching  Urinary Tract Infection Urinary tract infections (UTIs) can develop anywhere along your urinary tract. Your urinary tract is your body's drainage system for removing wastes and extra water. Your urinary tract includes two kidneys, two ureters, a bladder, and a urethra. Your kidneys are a pair of bean-shaped organs. Each kidney is about the size of your fist. They are located below your ribs, one on each side of your spine. CAUSES Infections are caused by microbes, which are microscopic organisms, including fungi, viruses, and bacteria. These organisms are so small that they can only be seen through a microscope. Bacteria are the microbes that most commonly cause UTIs. SYMPTOMS  Symptoms of UTIs may vary by age and gender of the patient and by the location of the infection. Symptoms in young women typically include a frequent and intense urge to urinate and a painful, burning feeling in the bladder or urethra during urination. Older women and men are more likely to be tired, shaky, and weak and have muscle aches and abdominal pain. A fever may mean the infection is in your kidneys. Other symptoms of a kidney infection include pain in your back or sides below the ribs, nausea, and vomiting. DIAGNOSIS To diagnose a UTI, your caregiver will ask you about your symptoms. Your caregiver also will ask to provide a urine sample. The urine sample will be tested for bacteria and white blood cells. White blood cells are made by your body to help fight infection. TREATMENT  Typically, UTIs can be treated with medication. Because most UTIs are caused by a bacterial infection, they usually can be treated with the use of antibiotics. The choice of antibiotic and length of treatment depend on your symptoms and the type of bacteria causing your infection. HOME CARE INSTRUCTIONS  If you were prescribed  antibiotics, take them exactly as your caregiver instructs you. Finish the medication even if you feel better after you have only taken some of the medication.  Drink enough water and fluids to keep your urine clear or pale yellow.  Avoid caffeine, tea, and carbonated beverages. They tend to irritate your bladder.  Empty your bladder often. Avoid holding urine for long periods of time.  Empty your bladder before and after sexual intercourse.  After a bowel movement, women should cleanse from front to back. Use each tissue only once. SEEK MEDICAL CARE IF:   You have back pain.  You develop a fever.  Your symptoms do not begin to resolve within 3 days. SEEK IMMEDIATE MEDICAL CARE IF:   You have severe back pain or lower abdominal pain.  You develop chills.  You have nausea or vomiting.  You have continued burning or discomfort with urination. MAKE SURE YOU:   Understand these instructions.  Will watch your condition.  Will get help right away if you are not doing well or get worse. Document Released: 12/23/2004 Document Revised: 09/14/2011 Document Reviewed: 04/23/2011 [facility] Patient Information 2014 [place].

## 2013-08-17 NOTE — Assessment & Plan Note (Signed)
Well controlled, no changes to meds. Encouraged heart healthy diet such as the DASH diet and exercise as tolerated.  °

## 2013-08-17 NOTE — Assessment & Plan Note (Signed)
Encouraged moist heat and gentle stretching as tolerated. May try NSAIDs as directed and report if symptoms worsen or seek immediate care. Try Salon pas patches

## 2013-08-18 LAB — RENAL FUNCTION PANEL
Albumin: 3.9 g/dL (ref 3.5–5.2)
BUN: 19 mg/dL (ref 6–23)
CO2: 25 mEq/L (ref 19–32)
Calcium: 9.5 mg/dL (ref 8.4–10.5)
Chloride: 100 mEq/L (ref 96–112)
Creat: 0.68 mg/dL (ref 0.50–1.10)
Glucose, Bld: 108 mg/dL — ABNORMAL HIGH (ref 70–99)
Phosphorus: 3.4 mg/dL (ref 2.3–4.6)
Potassium: 3.3 mEq/L — ABNORMAL LOW (ref 3.5–5.3)
Sodium: 139 mEq/L (ref 135–145)

## 2013-08-19 LAB — URINE CULTURE
COLONY COUNT: NO GROWTH
Organism ID, Bacteria: NO GROWTH

## 2013-08-21 ENCOUNTER — Telehealth: Payer: Self-pay

## 2013-08-21 DIAGNOSIS — E876 Hypokalemia: Secondary | ICD-10-CM

## 2013-08-21 NOTE — Telephone Encounter (Signed)
Lab order placed per md

## 2013-11-28 ENCOUNTER — Ambulatory Visit (INDEPENDENT_AMBULATORY_CARE_PROVIDER_SITE_OTHER): Payer: 59 | Admitting: Physician Assistant

## 2013-11-28 ENCOUNTER — Telehealth: Payer: Self-pay | Admitting: Physician Assistant

## 2013-11-28 ENCOUNTER — Ambulatory Visit (HOSPITAL_BASED_OUTPATIENT_CLINIC_OR_DEPARTMENT_OTHER)
Admission: RE | Admit: 2013-11-28 | Discharge: 2013-11-28 | Disposition: A | Discharge status: Home / Self Care | Payer: 59 | Source: Ambulatory Visit | Attending: Physician Assistant | Admitting: Physician Assistant

## 2013-11-28 ENCOUNTER — Encounter: Payer: Self-pay | Admitting: Physician Assistant

## 2013-11-28 VITALS — BP 125/70 | HR 80 | Temp 98.2°F | Wt 226.0 lb

## 2013-11-28 DIAGNOSIS — M6790 Unspecified disorder of synovium and tendon, unspecified site: Secondary | ICD-10-CM

## 2013-11-28 DIAGNOSIS — M67919 Unspecified disorder of synovium and tendon, unspecified shoulder: Secondary | ICD-10-CM | POA: Insufficient documentation

## 2013-11-28 DIAGNOSIS — M67912 Unspecified disorder of synovium and tendon, left shoulder: Secondary | ICD-10-CM

## 2013-11-28 DIAGNOSIS — M25519 Pain in unspecified shoulder: Secondary | ICD-10-CM | POA: Diagnosis not present

## 2013-11-28 DIAGNOSIS — I10 Essential (primary) hypertension: Secondary | ICD-10-CM | POA: Insufficient documentation

## 2013-11-28 DIAGNOSIS — M719 Bursopathy, unspecified: Secondary | ICD-10-CM

## 2013-11-28 MED ORDER — TRAMADOL HCL 50 MG PO TABS: ORAL_TABLET | ORAL | Status: DC

## 2013-11-28 MED ORDER — CYCLOBENZAPRINE HCL 5 MG PO TABS: 5.0000 mg | ORAL_TABLET | Freq: Three times a day (TID) | ORAL | Status: DC | PRN

## 2013-11-28 MED ORDER — CHLORTHALIDONE 25 MG PO TABS: ORAL_TABLET | ORAL | Status: DC

## 2013-11-28 NOTE — Progress Notes (Signed)
Patient presents to clinic today c/o left shoulder pain with ROM x 3 months.  Has been worsening over the past few weeks.  Denies trauma or injury.  Denies history of pain in L shoulder.  Denies numbness tingling or weakness of extremities.  Has tried taking her Celebrex for pain but it upsets her stomach.  Ibuprofen is helping some.  Past Medical History  Diagnosis Date  . Dizziness   . HTN (hypertension)   . SVT (supraventricular tachycardia)   . Anginal pain   . SOB (shortness of breath) 09/23/11    "a little bit; at rest; before ablation"  . Chicken pox as a child  . Measles as a child  . Measles as a child  . Obesity   . Uterine fibroid 12/17/2012    Cervical polyp per patient Follows with [facility], Dr Evette Cristal    Current Outpatient Prescriptions on File Prior to Visit  Medication Sig Dispense Refill  . aspirin 81 MG tablet Take 81 mg by mouth daily.      . celecoxib (CELEBREX) 200 MG capsule Take 1 capsule (200 mg total) by mouth 2 (two) times daily as needed.  60 capsule  4  . cetirizine (ZYRTEC) 10 MG tablet Take 10 mg by mouth daily.      Marland Kitchen estrogens, conjugated, (PREMARIN) 0.625 MG tablet Take 0.625 mg by mouth daily. Take daily for 21 days then do not take for 7 days.      Marland Kitchen HYDROcodone-homatropine (HYCODAN) 5-1.5 MG/5ML syrup Take 5 mLs by mouth at bedtime as needed for cough.  120 mL  0  . Multiple Vitamins-Minerals (MULTIVITAMIN WITH MINERALS) tablet Take 1 tablet by mouth 3 (three) times a week.      . naproxen (NAPROSYN) 500 MG tablet Take 500 mg by mouth 2 (two) times daily with a meal.      . OMEGA-3 KRILL OIL PO Take 353 mg by mouth daily. Mega red joint care      . potassium chloride SA (K-DUR,KLOR-CON) 20 MEQ tablet Take 1 tablet (20 mEq total) by mouth 2 (two) times daily.  60 tablet  4   No current facility-administered medications on file prior to visit.    No Known Allergies  Family History  Problem Relation Age of Onset  . Cancer Sister     lung  . Diabetes Maternal Aunt   . Kidney disease Maternal Aunt   . Diabetes Mother     type 2  . Heart disease Mother   . Heart disease Father   . Diabetes Maternal Uncle   . Hypertension Daughter   . Stroke Maternal Grandmother     History   Social History  . Marital Status: Married    Spouse Name: N/A    Number of Children: N/A  . Years of Education: N/A   Social History Main Topics  . Smoking status: Former Smoker -- 0.12 packs/day for 8 years    Types: Cigarettes    Quit date: 03/29/1984  . Smokeless tobacco: Never Used  . Alcohol Use: 8.4 oz/week    14 Glasses of wine per week  . Drug Use: No  . Sexual Activity: Not Currently   Other Topics Concern  . None   Social History Narrative  . None   Review of Systems - See HPI.  All other ROS are negative.  BP 125/70  Pulse 80  Temp(Src) 98.2 F (36.8 C)  Wt 226 lb (102.513 kg)  SpO2 95%  Physical Exam  Vitals reviewed. Constitutional: She is oriented to person, place, and time and well-developed, well-nourished, and in no distress.  HENT:  Head: Normocephalic and atraumatic.  Eyes: Conjunctivae are normal.  Cardiovascular: Normal rate, regular rhythm, normal heart sounds and intact distal pulses.   Pulmonary/Chest: Effort normal and breath sounds normal. No respiratory distress.  Musculoskeletal:       Left shoulder: She exhibits decreased range of motion, tenderness, pain, spasm and decreased strength. She exhibits no bony tenderness, no swelling, no crepitus, no deformity and normal pulse.       Left elbow: Normal.       Left wrist: Normal.       Cervical back: Normal.  Neurological: She is alert and oriented to person, place, and time. She has normal reflexes.  Skin: Skin is warm and dry.  Psychiatric: Affect normal.    No results found for this or any previous visit (from the past 2160 hour(s)).  Assessment/Plan: Tendinopathy of rotator cuff Will obtain X-ray of left shoulder.  Will then likely  proceed with MRI.  Rx Tramadol and Flexeril.  Take as directed.  Avoid heavy lifting or overexertion.  Topical Aspercreme.  Heating Pad.  Essential hypertension, benign Chlorthalidone refilled.  Will obtain BMP today to reassess potassium level.

## 2013-11-28 NOTE — Telephone Encounter (Signed)
Pt notified. Verbalized understanding . Pt scheduled MRI this Saturday.

## 2013-11-28 NOTE — Assessment & Plan Note (Signed)
Will obtain X-ray of left shoulder.  Will then likely proceed with MRI.  Rx Tramadol and Flexeril.  Take as directed.  Avoid heavy lifting or overexertion.  Topical Aspercreme.  Heating Pad.

## 2013-11-28 NOTE — Telephone Encounter (Signed)
X-ray reveals bone spurring at [facility] joint which can contribute to rotator cuff injury.  Will proceed with MRI.  Patient will be contacted to schedule.  Continue care as discussed at today's visit.  Once MRI results are in, will set her up with Orthopedic Surgeon.

## 2013-11-28 NOTE — Progress Notes (Signed)
Pre visit review using our clinic review tool, if applicable. No additional management support is needed unless otherwise documented below in the visit note. 

## 2013-11-28 NOTE — Patient Instructions (Signed)
Please go downstairs for x-ray.  I will call you with your results.  We will likely move pretty quickly to get an MRI to further assess.  Please take tramadol and Flexeril as directed. Apply heat to area.  Use Topical Aspercreme.  Avoid heavy lifting or over exertion.  I have refilled your BP medication for Dr. Charlett Blake.  You need to stop by the lab so we can recheck your potassium level.  It was a pleasure to participate in your care today.

## 2013-11-28 NOTE — Assessment & Plan Note (Signed)
Chlorthalidone refilled.  Will obtain BMP today to reassess potassium level.

## 2013-11-29 ENCOUNTER — Other Ambulatory Visit: Payer: Self-pay | Admitting: Physician Assistant

## 2013-11-29 LAB — BASIC METABOLIC PANEL
BUN: 12 mg/dL (ref 6–23)
CHLORIDE: 102 meq/L (ref 96–112)
CO2: 28 mEq/L (ref 19–32)
Calcium: 9.5 mg/dL (ref 8.4–10.5)
Creatinine, Ser: 0.9 mg/dL (ref 0.4–1.2)
GFR: 86.3 mL/min (ref >60.00)
GLUCOSE: 91 mg/dL (ref 70–99)
Potassium: 3.2 mEq/L — ABNORMAL LOW (ref 3.5–5.1)
Sodium: 140 mEq/L (ref 135–145)

## 2013-11-29 MED ORDER — POTASSIUM CHLORIDE CRYS ER 20 MEQ PO TBCR: 20.0000 meq | EXTENDED_RELEASE_TABLET | Freq: Two times a day (BID) | ORAL | Status: DC

## 2013-12-01 ENCOUNTER — Telehealth: Payer: Self-pay | Admitting: Physician Assistant

## 2013-12-01 ENCOUNTER — Ambulatory Visit (HOSPITAL_BASED_OUTPATIENT_CLINIC_OR_DEPARTMENT_OTHER)
Admission: RE | Admit: 2013-12-01 | Discharge: 2013-12-01 | Disposition: A | Discharge status: Home / Self Care | Payer: 59 | Source: Ambulatory Visit | Attending: Physician Assistant | Admitting: Physician Assistant

## 2013-12-01 ENCOUNTER — Ambulatory Visit (HOSPITAL_BASED_OUTPATIENT_CLINIC_OR_DEPARTMENT_OTHER): Payer: 59

## 2013-12-01 DIAGNOSIS — M719 Bursopathy, unspecified: Principal | ICD-10-CM | POA: Insufficient documentation

## 2013-12-01 DIAGNOSIS — S46819A Strain of other muscles, fascia and tendons at shoulder and upper arm level, unspecified arm, initial encounter: Secondary | ICD-10-CM | POA: Diagnosis not present

## 2013-12-01 DIAGNOSIS — M75102 Unspecified rotator cuff tear or rupture of left shoulder, not specified as traumatic: Secondary | ICD-10-CM

## 2013-12-01 DIAGNOSIS — M67919 Unspecified disorder of synovium and tendon, unspecified shoulder: Secondary | ICD-10-CM | POA: Diagnosis not present

## 2013-12-01 DIAGNOSIS — M25419 Effusion, unspecified shoulder: Secondary | ICD-10-CM | POA: Diagnosis not present

## 2013-12-01 DIAGNOSIS — M67912 Unspecified disorder of synovium and tendon, left shoulder: Secondary | ICD-10-CM

## 2013-12-01 DIAGNOSIS — M25519 Pain in unspecified shoulder: Secondary | ICD-10-CM | POA: Diagnosis present

## 2013-12-01 DIAGNOSIS — S43432A Superior glenoid labrum lesion of left shoulder, initial encounter: Secondary | ICD-10-CM

## 2013-12-01 DIAGNOSIS — R937 Abnormal findings on diagnostic imaging of other parts of musculoskeletal system: Secondary | ICD-10-CM | POA: Diagnosis not present

## 2013-12-01 DIAGNOSIS — X58XXXA Exposure to other specified factors, initial encounter: Secondary | ICD-10-CM | POA: Diagnosis not present

## 2013-12-01 NOTE — Telephone Encounter (Signed)
MRI notes partial thickness rotator cuff tear and a tear in her labrum.  These are the cause of her pain and decreased range of motion.  I have placed a referral to Orthopedic Surgery as she will need specialist management for this.  Until appointment, continue care as discussed at visit.  Return to clinic if pain acutely worsens.

## 2013-12-04 NOTE — Telephone Encounter (Signed)
Pt notified, pt verbalized understanding

## 2013-12-13 ENCOUNTER — Ambulatory Visit: Payer: 59 | Admitting: Family Medicine

## 2014-01-10 ENCOUNTER — Ambulatory Visit (INDEPENDENT_AMBULATORY_CARE_PROVIDER_SITE_OTHER): Payer: 59 | Admitting: Family Medicine

## 2014-01-10 ENCOUNTER — Encounter: Payer: Self-pay | Admitting: Family Medicine

## 2014-01-10 VITALS — BP 120/61 | HR 77 | Temp 98.2°F | Ht 62.0 in | Wt 225.4 lb

## 2014-01-10 DIAGNOSIS — E785 Hyperlipidemia, unspecified: Secondary | ICD-10-CM

## 2014-01-10 DIAGNOSIS — R252 Cramp and spasm: Secondary | ICD-10-CM

## 2014-01-10 DIAGNOSIS — I10 Essential (primary) hypertension: Secondary | ICD-10-CM

## 2014-01-10 DIAGNOSIS — I471 Supraventricular tachycardia: Secondary | ICD-10-CM

## 2014-01-10 MED ORDER — CHLORTHALIDONE 25 MG PO TABS: 12.5000 mg | ORAL_TABLET | Freq: Every day | ORAL | Status: DC

## 2014-01-10 NOTE — Patient Instructions (Signed)
Hypertension Hypertension, commonly called high blood pressure, is when the force of blood pumping through your arteries is too strong. Your arteries are the blood vessels that carry blood from your heart throughout your body. A blood pressure reading consists of a higher number over a lower number, such as 110/72. The higher number (systolic) is the pressure inside your arteries when your heart pumps. The lower number (diastolic) is the pressure inside your arteries when your heart relaxes. Ideally you want your blood pressure below 120/80. Hypertension forces your heart to work harder to pump blood. Your arteries may become narrow or stiff. Having hypertension puts you at risk for heart disease, stroke, and other problems.  RISK FACTORS Some risk factors for high blood pressure are controllable. Others are not.  Tonic Water or Hyland's leg cramp medicine   Hypokalemia Hypokalemia means that the amount of potassium in the blood is lower than normal.Potassium is a chemical, called an electrolyte, that helps regulate the amount of fluid in the body. It also stimulates muscle contraction and helps nerves function properly.Most of the body's potassium is inside of cells, and only a very small amount is in the blood. Because the amount in the blood is so small, minor changes can be life-threatening. CAUSES  Antibiotics.  Diarrhea or vomiting.  Using laxatives too much, which can cause diarrhea.  Chronic kidney disease.  Water pills (diuretics).  Eating disorders (bulimia).  Low magnesium level.  Sweating a lot. SIGNS AND SYMPTOMS  Weakness.  Constipation.  Fatigue.  Muscle cramps.  Mental confusion.  Skipped heartbeats or irregular heartbeat (palpitations).  Tingling or numbness. DIAGNOSIS  Your health care provider can diagnose hypokalemia with blood tests. In addition to checking your potassium level, your health care provider may also check other lab  tests. TREATMENT Hypokalemia can be treated with potassium supplements taken by mouth or adjustments in your current medicines. If your potassium level is very low, you may need to get potassium through a vein (IV) and be monitored in the hospital. A diet high in potassium is also helpful. Foods high in potassium are:  Nuts, such as peanuts and pistachios.  Seeds, such as sunflower seeds and pumpkin seeds.  Peas, lentils, and lima beans.  Whole grain and bran cereals and breads.  Fresh fruit and vegetables, such as apricots, avocado, bananas, cantaloupe, kiwi, oranges, tomatoes, asparagus, and potatoes.  Orange and tomato juices.  Red meats.  Fruit yogurt. HOME CARE INSTRUCTIONS  Take all medicines as prescribed by your health care provider.  Maintain a healthy diet by including nutritious food, such as fruits, vegetables, nuts, whole grains, and lean meats.  If you are taking a laxative, be sure to follow the directions on the label. SEEK MEDICAL CARE IF:  Your weakness gets worse.  You feel your heart pounding or racing.  You are vomiting or having diarrhea.  You are diabetic and having trouble keeping your blood glucose in the normal range. SEEK IMMEDIATE MEDICAL CARE IF:  You have chest pain, shortness of breath, or dizziness.  You are vomiting or having diarrhea for more than 2 days.  You faint. MAKE SURE YOU:   Understand these instructions.  Will watch your condition.  Will get help right away if you are not doing well or get worse. Document Released: 03/15/2005 Document Revised: 01/03/2013 Document Reviewed: 09/15/2012 [facility] Patient Information 2015 [place], [place]. This information is not intended to replace advice given to you by your health care provider. Make sure you discuss any  questions you have with your health care provider.  Risk factors you cannot control include:   Race. You may be at higher risk if you are African American.  Age. Risk  increases with age.  Gender. Men are at higher risk than women before age 32 years. After age 32, women are at higher risk than men. Risk factors you can control include:  Not getting enough exercise or physical activity.  Being overweight.  Getting too much fat, sugar, calories, or salt in your diet.  Drinking too much alcohol. SIGNS AND SYMPTOMS Hypertension does not usually cause signs or symptoms. Extremely high blood pressure (hypertensive crisis) may cause headache, anxiety, shortness of breath, and nosebleed. DIAGNOSIS  To check if you have hypertension, your health care provider will measure your blood pressure while you are seated, with your arm held at the level of your heart. It should be measured at least twice using the same arm. Certain conditions can cause a difference in blood pressure between your right and left arms. A blood pressure reading that is higher than normal on one occasion does not mean that you need treatment. If one blood pressure reading is high, ask your health care provider about having it checked again. TREATMENT  Treating high blood pressure includes making lifestyle changes and possibly taking medicine. Living a healthy lifestyle can help lower high blood pressure. You may need to change some of your habits. Lifestyle changes may include:  Following the DASH diet. This diet is high in fruits, vegetables, and whole grains. It is low in salt, red meat, and added sugars.  Getting at least 2 hours of brisk physical activity every week.  Losing weight if necessary.  Not smoking.  Limiting alcoholic beverages.  Learning ways to reduce stress. If lifestyle changes are not enough to get your blood pressure under control, your health care provider may prescribe medicine. You may need to take more than one. Work closely with your health care provider to understand the risks and benefits. HOME CARE INSTRUCTIONS  Have your blood pressure rechecked as  directed by your health care provider.   Take medicines only as directed by your health care provider. Follow the directions carefully. Blood pressure medicines must be taken as prescribed. The medicine does not work as well when you skip doses. Skipping doses also puts you at risk for problems.   Do not smoke.   Monitor your blood pressure at home as directed by your health care provider. SEEK MEDICAL CARE IF:   You think you are having a reaction to medicines taken.  You have recurrent headaches or feel dizzy.  You have swelling in your ankles.  You have trouble with your vision. SEEK IMMEDIATE MEDICAL CARE IF:  You develop a severe headache or confusion.  You have unusual weakness, numbness, or feel faint.  You have severe chest or abdominal pain.  You vomit repeatedly.  You have trouble breathing. MAKE SURE YOU:   Understand these instructions.  Will watch your condition.  Will get help right away if you are not doing well or get worse. Document Released: 03/15/2005 Document Revised: 07/30/2013 Document Reviewed: 01/05/2013 [facility] Patient Information 2015 [place], [place]. This information is not intended to replace advice given to you by your health care provider. Make sure you discuss any questions you have with your health care provider.

## 2014-01-10 NOTE — Progress Notes (Signed)
Pre visit review using our clinic review tool, if applicable. No additional management support is needed unless otherwise documented below in the visit note. 

## 2014-01-11 LAB — RENAL FUNCTION PANEL
ALBUMIN: 3.4 g/dL — AB (ref 3.5–5.2)
BUN: 18 mg/dL (ref 6–23)
CHLORIDE: 102 meq/L (ref 96–112)
CO2: 28 meq/L (ref 19–32)
Calcium: 9.6 mg/dL (ref 8.4–10.5)
Creatinine, Ser: 0.8 mg/dL (ref 0.4–1.2)
GFR: 96.3 mL/min (ref >60.00)
Glucose, Bld: 90 mg/dL (ref 70–99)
Phosphorus: 3.3 mg/dL (ref 2.3–4.6)
Potassium: 3.3 mEq/L — ABNORMAL LOW (ref 3.5–5.1)
Sodium: 140 mEq/L (ref 135–145)

## 2014-01-11 LAB — MAGNESIUM: Magnesium: 2 mg/dL (ref 1.5–2.5)

## 2014-01-13 ENCOUNTER — Encounter: Payer: Self-pay | Admitting: Family Medicine

## 2014-01-13 DIAGNOSIS — R252 Cramp and spasm: Secondary | ICD-10-CM | POA: Insufficient documentation

## 2014-01-13 NOTE — Assessment & Plan Note (Signed)
Well controlled, no changes to meds. Encouraged heart healthy diet such as the DASH diet and exercise as tolerated.  °

## 2014-01-13 NOTE — Assessment & Plan Note (Signed)
RRR, avoid caffeine.

## 2014-01-13 NOTE — Assessment & Plan Note (Signed)
Encouraged increased hydration, start Magnesium tabs dialy and monitor electrolytes, potassium is low. Continue supplements and cut chlorthalidone in 1/2 and recheck

## 2014-01-13 NOTE — Assessment & Plan Note (Deleted)
Well controlled, no changes to meds. Encouraged heart healthy diet such as the DASH diet and exercise as tolerated.  °

## 2014-01-13 NOTE — Assessment & Plan Note (Signed)
Encouraged heart healthy diet, increase exercise, avoid trans fats, consider a krill oil cap daily 

## 2014-01-13 NOTE — Progress Notes (Signed)
Patient ID: Sherri Sullivan, female   DOB: 14-Jul-1960, 53 y.o.   MRN: [id number] Sherri Sullivan [id number] Sep 13, 1960 01/13/2014      Progress Note-Follow Up  Subjective  Chief Complaint  Chief Complaint  Patient presents with  . Follow-up    HPI  Patient is a 53 year old female in today for routine medical care. In today for followup. Has had some shoulder pain from an [facility] tear but it is improving. No recent illness. No other acute concerns except for some increased muscle cramps occuring both day and night Denies CP/palp/SOB/HA/congestion/fevers/GI or GU c/o. Taking meds as prescribed  Past Medical History  Diagnosis Date  . Dizziness   . HTN (hypertension)   . SVT (supraventricular tachycardia)   . Anginal pain   . SOB (shortness of breath) 09/23/11    "a little bit; at rest; before ablation"  . Chicken pox as a child  . Measles as a child  . Measles as a child  . Obesity   . Uterine fibroid 12/17/2012    Cervical polyp per patient Follows with [facility], Dr Evette Cristal    Past Surgical History  Procedure Laterality Date  . Cardiac electrophysiology study and ablation  09/23/11  . Abdominal hysterectomy  1990's  . Tubal ligation  1980's    Family History  Problem Relation Age of Onset  . Cancer Sister     lung  . Diabetes Maternal Aunt   . Kidney disease Maternal Aunt   . Diabetes Mother     type 2  . Heart disease Mother   . Heart disease Father   . Diabetes Maternal Uncle   . Hypertension Daughter   . Stroke Maternal Grandmother     History   Social History  . Marital Status: Married    Spouse Name: N/A    Number of Children: N/A  . Years of Education: N/A   Occupational History  . Not on file.   Social History Main Topics  . Smoking status: Former Smoker -- 0.12 packs/day for 8 years    Types: Cigarettes    Quit date: 03/29/1984  . Smokeless tobacco: Never Used  . Alcohol Use: 8.4 oz/week    14 Glasses of wine per week  . Drug Use: No   . Sexual Activity: Not Currently   Other Topics Concern  . Not on file   Social History Narrative  . No narrative on file    Current Outpatient Prescriptions on File Prior to Visit  Medication Sig Dispense Refill  . aspirin 81 MG tablet Take 81 mg by mouth daily.      . celecoxib (CELEBREX) 200 MG capsule Take 1 capsule (200 mg total) by mouth 2 (two) times daily as needed.  60 capsule  4  . cetirizine (ZYRTEC) 10 MG tablet Take 10 mg by mouth daily.      . cyclobenzaprine (FLEXERIL) 5 MG tablet Take 1 tablet (5 mg total) by mouth 3 (three) times daily as needed. For muscle pain  60 tablet  3  . estrogens, conjugated, (PREMARIN) 0.625 MG tablet Take 0.625 mg by mouth daily. Take daily for 21 days then do not take for 7 days.      . Multiple Vitamins-Minerals (MULTIVITAMIN WITH MINERALS) tablet Take 1 tablet by mouth 3 (three) times a week.      . naproxen (NAPROSYN) 500 MG tablet Take 500 mg by mouth 2 (two) times daily with a meal.      .  OMEGA-3 KRILL OIL PO Take 353 mg by mouth daily. Mega red joint care      . potassium chloride SA (K-DUR,KLOR-CON) 20 MEQ tablet Take 1 tablet (20 mEq total) by mouth 2 (two) times daily.  60 tablet  4  . traMADol (ULTRAM) 50 MG tablet 1-2 tabs by mouth Q8 hours, maximum 6 tabs per day.  90 tablet  0   No current facility-administered medications on file prior to visit.    No Known Allergies  Review of Systems  Review of Systems  Constitutional: Negative for fever and malaise/fatigue.  HENT: Negative for congestion.   Eyes: Negative for discharge.  Respiratory: Negative for shortness of breath.   Cardiovascular: Negative for chest pain, palpitations and leg swelling.  Gastrointestinal: Negative for nausea, abdominal pain and diarrhea.  Genitourinary: Negative for dysuria.  Musculoskeletal: Negative for falls.  Skin: Negative for rash.  Neurological: Negative for loss of consciousness and headaches.  Endo/Heme/Allergies: Negative for  polydipsia.  Psychiatric/Behavioral: Negative for depression and suicidal ideas. The patient is not nervous/anxious and does not have insomnia.     Objective  BP 120/61  Pulse 77  Temp(Src) 98.2 F (36.8 C) (Oral)  Ht 5\' 2"  (1.575 m)  Wt 225 lb 6.4 oz (102.241 kg)  BMI 41.22 kg/m2  SpO2 100%  Physical Exam  Physical Exam  Constitutional: She is oriented to person, place, and time and well-developed, well-nourished, and in no distress. No distress.  HENT:  Head: Normocephalic and atraumatic.  Eyes: Conjunctivae are normal.  Neck: Neck supple. No thyromegaly present.  Cardiovascular: Normal rate, regular rhythm and normal heart sounds.   No murmur heard. Pulmonary/Chest: Effort normal and breath sounds normal. She has no wheezes.  Abdominal: She exhibits no distension and no mass.  Musculoskeletal: She exhibits no edema.  Lymphadenopathy:    She has no cervical adenopathy.  Neurological: She is alert and oriented to person, place, and time.  Skin: Skin is warm and dry. No rash noted. She is not diaphoretic.  Psychiatric: Memory, affect and judgment normal.    Lab Results  Component Value Date   TSH 1.097 04/03/2013   Lab Results  Component Value Date   WBC 7.1 08/17/2013   HGB 13.2 08/17/2013   HCT 38.6 08/17/2013   MCV 83.4 08/17/2013   PLT 258 08/17/2013   Lab Results  Component Value Date   CREATININE 0.8 01/10/2014   BUN 18 01/10/2014   NA 140 01/10/2014   K 3.3* 01/10/2014   CL 102 01/10/2014   CO2 28 01/10/2014   Lab Results  Component Value Date   ALT 12 04/03/2013   AST 14 04/03/2013   ALKPHOS 52 04/03/2013   BILITOT 0.8 04/03/2013   Lab Results  Component Value Date   CHOL 225* 04/03/2013   Lab Results  Component Value Date   HDL 60 04/03/2013   Lab Results  Component Value Date   LDLCALC 138* 04/03/2013   Lab Results  Component Value Date   TRIG 134 04/03/2013   Lab Results  Component Value Date   CHOLHDL 3.8 04/03/2013     Assessment &  Plan  Hyperlipidemia Encouraged heart healthy diet, increase exercise, avoid trans fats, consider a krill oil cap daily  Essential hypertension, benign Well controlled, no changes to meds. Encouraged heart healthy diet such as the DASH diet and exercise as tolerated.   Muscle cramps Encouraged increased hydration, start Magnesium tabs dialy and monitor electrolytes, potassium is low. Continue supplements and cut chlorthalidone  in 1/2 and recheck  SVT (supraventricular tachycardia) RRR, avoid caffeine.

## 2014-01-14 ENCOUNTER — Encounter: Payer: Self-pay | Admitting: Family Medicine

## 2014-01-14 NOTE — Addendum Note (Signed)
Addended by: Varney Daily on: 01/14/2014 03:32 PM   Modules accepted: Orders

## 2014-01-20 ENCOUNTER — Encounter: Payer: Self-pay | Admitting: Family Medicine

## 2014-03-07 ENCOUNTER — Encounter (HOSPITAL_COMMUNITY): Payer: Self-pay | Admitting: Internal Medicine

## 2014-03-25 ENCOUNTER — Other Ambulatory Visit: Payer: Self-pay | Admitting: Physician Assistant

## 2014-03-26 NOTE — Telephone Encounter (Signed)
Rx request to pharmacy/SL[facility]  

## 2014-04-08 ENCOUNTER — Other Ambulatory Visit: Payer: Self-pay | Admitting: Medical

## 2014-04-08 ENCOUNTER — Telehealth: Payer: Self-pay | Admitting: Medical

## 2014-04-08 ENCOUNTER — Ambulatory Visit (INDEPENDENT_AMBULATORY_CARE_PROVIDER_SITE_OTHER): Payer: 59 | Admitting: Medical

## 2014-04-08 ENCOUNTER — Encounter: Payer: Self-pay | Admitting: Medical

## 2014-04-08 ENCOUNTER — Ambulatory Visit (HOSPITAL_BASED_OUTPATIENT_CLINIC_OR_DEPARTMENT_OTHER)
Admission: RE | Admit: 2014-04-08 | Discharge: 2014-04-08 | Disposition: A | Discharge status: Home / Self Care | Payer: 59 | Source: Ambulatory Visit | Attending: Medical | Admitting: Medical

## 2014-04-08 VITALS — BP 121/76 | HR 79 | Temp 98.0°F | Ht 62.0 in | Wt 223.2 lb

## 2014-04-08 DIAGNOSIS — M542 Cervicalgia: Secondary | ICD-10-CM

## 2014-04-08 DIAGNOSIS — M79604 Pain in right leg: Secondary | ICD-10-CM | POA: Insufficient documentation

## 2014-04-08 DIAGNOSIS — M79609 Pain in unspecified limb: Secondary | ICD-10-CM

## 2014-04-08 NOTE — Telephone Encounter (Signed)
Pt notified of her negative doppler.

## 2014-04-08 NOTE — Patient Instructions (Addendum)
For your neck pain, I do want to get c spine xray. Stop celebrex due to GI irritation. You report you tolerate naprosyn better. So can use that.   For you leg pain, I will order rt lower ext doppler. Please get that tonight. I placed that order in today.  Follow up 5 days or as needed.  I have your cell phone. Number [phone]. [facility] [place]

## 2014-04-08 NOTE — Assessment & Plan Note (Addendum)
For you leg pain, I will order rt lower ext doppler. Please get that tonight. I placed that order in today.  Naprosyn for any pain or discomfort.  Discussed with pt that if numbness of her toes worsens or neuropathy dype pain then would expand work up.

## 2014-04-08 NOTE — Progress Notes (Signed)
Subjective:    Patient ID: Sherri Sullivan, female    DOB: 1960/08/21, 54 y.o.   MRN: [id number]  HPI   Rt leg pain. She states pain for about 2.5 weeks. No trauma or fall. No pain in her back. No radiating pain down her leg. She actually states pain is behind her knee and radiates down.Pt rt great  toe feel faint tingly/numb but the entire foot does not feel like that. Pain in popliteal area when she walks. No back pain presently but does have history of back pain in the past.  Pt states mild neck pain Thursday. Friday am when woke hurt.Pt pain in neck is gradually getting better. Minimal now compared when it started.  Past Medical History  Diagnosis Date  . Dizziness   . HTN (hypertension)   . SVT (supraventricular tachycardia)   . Anginal pain   . SOB (shortness of breath) 09/23/11    "a little bit; at rest; before ablation"  . Chicken pox as a child  . Measles as a child  . Measles as a child  . Obesity   . Uterine fibroid 12/17/2012    Cervical polyp per patient Follows with [facility], Dr Evette Cristal    History   Social History  . Marital Status: Married    Spouse Name: N/A    Number of Children: N/A  . Years of Education: N/A   Occupational History  . Not on file.   Social History Main Topics  . Smoking status: Former Smoker -- 0.12 packs/day for 8 years    Types: Cigarettes    Quit date: 03/29/1984  . Smokeless tobacco: Never Used  . Alcohol Use: 8.4 oz/week    14 Glasses of wine per week  . Drug Use: No  . Sexual Activity: Not Currently   Other Topics Concern  . Not on file   Social History Narrative    Past Surgical History  Procedure Laterality Date  . Cardiac electrophysiology study and ablation  09/23/11  . Abdominal hysterectomy  1990's  . Tubal ligation  1980's  . Supraventricular tachycardia ablation N/A 09/23/2011    Procedure: SUPRAVENTRICULAR TACHYCARDIA ABLATION;  Surgeon: Evans Lance, MD;  Location: [facility] CATH LAB;  Service:  Cardiovascular;  Laterality: N/A;    Family History  Problem Relation Age of Onset  . Cancer Sister     lung  . Diabetes Maternal Aunt   . Kidney disease Maternal Aunt   . Diabetes Mother     type 2  . Heart disease Mother   . Heart disease Father   . Diabetes Maternal Uncle   . Hypertension Daughter   . Stroke Maternal Grandmother     No Known Allergies  Current Outpatient Prescriptions on File Prior to Visit  Medication Sig Dispense Refill  . aspirin 81 MG tablet Take 81 mg by mouth daily.    . celecoxib (CELEBREX) 200 MG capsule Take 1 capsule (200 mg total) by mouth 2 (two) times daily as needed. 60 capsule 4  . cetirizine (ZYRTEC) 10 MG tablet Take 10 mg by mouth daily.    . chlorthalidone (HYGROTON) 25 MG tablet TAKE 1 TABLET (25 MG) BY MOUTH DAILY. 30 tablet 0  . cyclobenzaprine (FLEXERIL) 5 MG tablet Take 1 tablet (5 mg total) by mouth 3 (three) times daily as needed. For muscle pain 60 tablet 3  . estrogens, conjugated, (PREMARIN) 0.625 MG tablet Take 0.625 mg by mouth daily. Take daily for 21 days  then do not take for 7 days.    . Multiple Vitamins-Minerals (MULTIVITAMIN WITH MINERALS) tablet Take 1 tablet by mouth 3 (three) times a week.    . naproxen (NAPROSYN) 500 MG tablet Take 500 mg by mouth 2 (two) times daily with a meal.    . OMEGA-3 KRILL OIL PO Take 353 mg by mouth daily. Mega red joint care    . potassium chloride SA (K-DUR,KLOR-CON) 20 MEQ tablet Take 1 tablet (20 mEq total) by mouth 2 (two) times daily. 60 tablet 4  . traMADol (ULTRAM) 50 MG tablet 1-2 tabs by mouth Q8 hours, maximum 6 tabs per day. 90 tablet 0   No current facility-administered medications on file prior to visit.    BP 121/76 mmHg  Pulse 79  Temp(Src) 98 F (36.7 C) (Oral)  Ht 5\' 2"  (1.575 m)  Wt 223 lb 3.2 oz (101.243 kg)  BMI 40.81 kg/m2  SpO2 99%      Review of Systems  Constitutional: Negative for fever, chills and fatigue.  Respiratory: Negative for cough, choking,  shortness of breath and wheezing.   Cardiovascular: Negative for chest pain and palpitations.  Musculoskeletal: Positive for neck pain. Negative for neck stiffness.       Rt popliteal pain and some pain radiating down her calf.  Neurological: Positive for numbness. Negative for dizziness, tremors, seizures, syncope, facial asymmetry, speech difficulty, weakness, light-headedness and headaches.       Faint numb sensation to her rt great toe.  Hematological: Positive for adenopathy. Does not bruise/bleed easily.       Objective:   Physical Exam   General- No acute distress. Pleasant patient. Neck- Full range of motion, no jvd. Faint mild cspine tenderness. Rt trapezius moderate tender to palpation.  Lungs- Clear, even and unlabored. Heart- regular rate and rhythm. Neurologic- CNII- XII grossly intact.   Back- no mid lumbar spine tenderness today  Rt lower ext- faint +homans sign. Calfs not swollen. No edema Symmetric compared to left side. Rt foot normal appearance. No swelling. No redness, no warmth, no tenderness to palpation. Pulses normal. Good capillary refill. Normal sensation sharp and dull on all toes except rt great toe.   great toe- mild dull sensation she can feel toe  but  difficult sharp and dull discriminitmation. Pulses intact.  .        Assessment & Plan:

## 2014-04-08 NOTE — Assessment & Plan Note (Signed)
For your neck pain, I do want to get c spine xray. Stop celebrex due to GI irritation. You report you tolerate naprosyn better. So can use that.

## 2014-04-08 NOTE — Progress Notes (Signed)
Pre visit review using our clinic review tool, if applicable. No additional management support is needed unless otherwise documented below in the visit note. 

## 2014-04-09 ENCOUNTER — Encounter: Payer: Self-pay | Admitting: Family Medicine

## 2014-04-10 ENCOUNTER — Other Ambulatory Visit: Payer: Self-pay | Admitting: Family Medicine

## 2014-04-10 DIAGNOSIS — M5441 Lumbago with sciatica, right side: Secondary | ICD-10-CM

## 2014-05-09 ENCOUNTER — Other Ambulatory Visit: Payer: Self-pay | Admitting: Family Medicine

## 2014-05-22 DIAGNOSIS — M4800 Spinal stenosis, site unspecified: Secondary | ICD-10-CM | POA: Insufficient documentation

## 2014-05-22 DIAGNOSIS — M4317 Spondylolisthesis, lumbosacral region: Secondary | ICD-10-CM | POA: Insufficient documentation

## 2014-05-22 DIAGNOSIS — M5416 Radiculopathy, lumbar region: Secondary | ICD-10-CM | POA: Insufficient documentation

## 2014-05-30 ENCOUNTER — Other Ambulatory Visit: Payer: Self-pay | Admitting: Family Medicine

## 2014-05-30 ENCOUNTER — Other Ambulatory Visit: Payer: Self-pay | Admitting: Physician Assistant

## 2014-05-31 NOTE — Telephone Encounter (Signed)
Rx request to pharmacy/SL[facility]  

## 2014-06-02 ENCOUNTER — Other Ambulatory Visit: Payer: Self-pay | Admitting: Physician Assistant

## 2014-06-02 ENCOUNTER — Other Ambulatory Visit: Payer: Self-pay | Admitting: Family Medicine

## 2014-06-03 ENCOUNTER — Other Ambulatory Visit: Payer: Self-pay | Admitting: Family Medicine

## 2014-06-03 ENCOUNTER — Encounter: Payer: Self-pay | Admitting: Family Medicine

## 2014-06-03 DIAGNOSIS — M67912 Unspecified disorder of synovium and tendon, left shoulder: Secondary | ICD-10-CM

## 2014-06-03 MED ORDER — TRAMADOL HCL 50 MG PO TABS: ORAL_TABLET | ORAL | Status: DC

## 2014-06-03 MED ORDER — POTASSIUM CHLORIDE CRYS ER 20 MEQ PO TBCR: EXTENDED_RELEASE_TABLET | ORAL | Status: DC

## 2014-06-03 MED ORDER — CHLORTHALIDONE 25 MG PO TABS: ORAL_TABLET | ORAL | Status: DC

## 2014-06-03 NOTE — Telephone Encounter (Signed)
Called the patient informed could pickup hardcopy of tramadol at the front desk.  Also would need to sign controlled medication contract and do UDS.  The patient agreed to all.

## 2014-06-03 NOTE — Telephone Encounter (Signed)
Printed tramadol per [employer] request and ok per PCP. Put on counter waiting signature.

## 2014-07-11 ENCOUNTER — Other Ambulatory Visit: Payer: Self-pay | Admitting: Neurosurgery

## 2014-08-05 ENCOUNTER — Encounter: Payer: 59 | Admitting: Family Medicine

## 2014-09-09 ENCOUNTER — Telehealth: Payer: Self-pay | Admitting: Family Medicine

## 2014-09-09 NOTE — Telephone Encounter (Signed)
Pre Visit letter sent  °

## 2014-09-13 ENCOUNTER — Other Ambulatory Visit: Payer: Self-pay

## 2014-09-13 ENCOUNTER — Encounter (HOSPITAL_COMMUNITY): Payer: Self-pay

## 2014-09-13 ENCOUNTER — Encounter (HOSPITAL_COMMUNITY)
Admission: RE | Admit: 2014-09-13 | Discharge: 2014-09-13 | Disposition: A | Discharge status: Home / Self Care | Payer: 59 | Source: Ambulatory Visit | Attending: Neurosurgery | Admitting: Neurosurgery

## 2014-09-13 DIAGNOSIS — Z01812 Encounter for preprocedural laboratory examination: Secondary | ICD-10-CM | POA: Diagnosis not present

## 2014-09-13 DIAGNOSIS — Z01818 Encounter for other preprocedural examination: Secondary | ICD-10-CM | POA: Insufficient documentation

## 2014-09-13 DIAGNOSIS — I10 Essential (primary) hypertension: Secondary | ICD-10-CM | POA: Insufficient documentation

## 2014-09-13 DIAGNOSIS — Z87891 Personal history of nicotine dependence: Secondary | ICD-10-CM | POA: Insufficient documentation

## 2014-09-13 HISTORY — DX: Panic disorder (episodic paroxysmal anxiety): F41.0

## 2014-09-13 HISTORY — DX: Unspecified osteoarthritis, unspecified site: M19.90

## 2014-09-13 HISTORY — DX: Cardiac arrhythmia, unspecified: I49.9

## 2014-09-13 LAB — CBC
HCT: 41.3 % (ref 36.0–46.0)
Hemoglobin: 13.7 g/dL (ref 12.0–15.0)
MCH: 29.2 pg (ref 26.0–34.0)
MCHC: 33.2 g/dL (ref 30.0–36.0)
MCV: 88.1 fL (ref 78.0–100.0)
Platelets: 242 10*3/uL (ref 150–400)
RBC: 4.69 MIL/uL (ref 3.87–5.11)
RDW: 15.4 % (ref 11.5–15.5)
WBC: 6.8 10*3/uL (ref 4.0–10.5)

## 2014-09-13 LAB — BASIC METABOLIC PANEL
Anion gap: 9 (ref 5–15)
BUN: 14 mg/dL (ref 6–20)
CALCIUM: 9.7 mg/dL (ref 8.9–10.3)
CHLORIDE: 102 mmol/L (ref 101–111)
CO2: 29 mmol/L (ref 22–32)
Creatinine, Ser: 0.7 mg/dL (ref 0.44–1.00)
GFR calc Af Amer: >60 mL/min (ref >60)
GFR calc non Af Amer: >60 mL/min (ref >60)
Glucose, Bld: 98 mg/dL (ref 65–99)
Potassium: 3 mmol/L — ABNORMAL LOW (ref 3.5–5.1)
SODIUM: 140 mmol/L (ref 135–145)

## 2014-09-13 LAB — SURGICAL PCR SCREEN
MRSA, PCR: NEGATIVE
Staphylococcus aureus: POSITIVE — AB

## 2014-09-13 NOTE — Pre-Procedure Instructions (Signed)
Sherri Sullivan  09/13/2014      [facility] [place] [address] [name] [address] Phone: [phone] Fax: [phone]    Your procedure is scheduled on : June 28th, Tuesday   Report to [place] at 5:30 A.M.   Call this number if you have problems the morning of surgery:  [phone]   Remember:  Do not eat food or drink liquids after midnight Monday.   Take these medicines the morning of surgery with A SIP OF WATER : Tramadol, Flexeril, Zyrtec.             Please STOP taking any herbal medications or supplements, & anti-inflammatories 4-5 days prior to surgery.   Do not wear jewelry, make-up or nail polish.  Do not wear lotions, powders, or perfumes.  You may NOT wear deodorant the day of surgery.  Do not shave underarms & legs 48 hours prior to surgery.             Do not bring valuables to the hospital.  [facility] is not responsible for any belongings or valuables.  Contacts, dentures or bridgework may not be worn into surgery.  Leave your suitcase in the car.  After surgery it may be brought to your room. For patients admitted to the hospital, discharge time will be determined by your treatment team.  Name and phone number of your driver:     Special instructions:  "Preparing for Surgery" instruction sheet.  Please read over the following fact sheets that you were given. Pain Booklet, Coughing and Deep Breathing, Blood Transfusion Information, MRSA Information and Surgical Site Infection Prevention

## 2014-09-13 NOTE — Progress Notes (Signed)
Pt.'s last visit with PCP- 12/2013.  Pt. Has been seen & treated by Dr. Beckie Salts for SVT & told to be seen for f/u PRN.  Pt.'s chart will be left for anesth. Review for cardiac history

## 2014-09-16 NOTE — Progress Notes (Signed)
Anesthesia Chart Review:  Pt is 54 year old female scheduled for L5-S1 PLIF on 09/24/2014 with Dr. Vertell Limber.  PMH includes: SVT (s/p catheter ablation 2013, no sx since), HTN. Former smoker. BMI 38.   Medications include: chlorthalidone, potassium.   Preoperative labs reviewed.  K 3.0.  EKG 09/13/2014: NSR. Possible Left atrial enlargement. Nonspecific T wave abnormality. Prolonged QT interval.  Since the last tracing on 09/24/2011 QT is now more prolonged.  If no changes, I anticipate pt can proceed with surgery as scheduled.   If no changes, I anticipate pt can proceed with surgery as scheduled.   Willeen Cass, FNP-BC [facility]/Anesthesiology Phone: [phone] 09/16/2014 3:07 PM

## 2014-09-23 ENCOUNTER — Other Ambulatory Visit: Payer: Self-pay | Admitting: Family Medicine

## 2014-09-23 MED ORDER — CEFAZOLIN SODIUM-DEXTROSE 2-3 GM-% IV SOLR
2.0000 g | INTRAVENOUS | Status: AC
  Administered 2014-09-24: 2 g via INTRAVENOUS
  Filled 2014-09-23: qty 50

## 2014-09-23 NOTE — Anesthesia Preprocedure Evaluation (Addendum)
Anesthesia Evaluation  Patient identified by MRN, date of birth, ID band Patient awake    Reviewed: Allergy & Precautions, NPO status , Patient's Chart, lab work & pertinent test results, reviewed documented beta blocker date and time   Airway Mallampati: II   Neck ROM: Full    Dental  (+) Teeth Intact, Dental Advisory Given   Pulmonary former smoker (quit 1986),  breath sounds clear to auscultation        Cardiovascular hypertension, Pt. on medications + dysrhythmias Supra Ventricular Tachycardia Rhythm:Regular  HX SVT none since ablation 2013, EKG with prolonged QT   Neuro/Psych Anxiety    GI/Hepatic negative GI ROS, Neg liver ROS,   Endo/Other  negative endocrine ROS  Renal/GU negative Renal ROS     Musculoskeletal   Abdominal (+) + obese,   Peds  Hematology 13/41   Anesthesia Other Findings   Reproductive/Obstetrics                            Anesthesia Physical Anesthesia Plan  ASA: III  Anesthesia Plan: General   Post-op Pain Management:    Induction: Intravenous  Airway Management Planned: Oral ETT  Additional Equipment:   Intra-op Plan:   Post-operative Plan: Extubation in OR  Informed Consent: I have reviewed the patients History and Physical, chart, labs and discussed the procedure including the risks, benefits and alternatives for the proposed anesthesia with the patient or authorized representative who has indicated his/her understanding and acceptance.     Plan Discussed with:   Anesthesia Plan Comments: (K 3.0, might want to give some K during case, multimodal pain RX)        Anesthesia Quick Evaluation

## 2014-09-24 ENCOUNTER — Inpatient Hospital Stay (HOSPITAL_COMMUNITY): Payer: 59 | Admitting: Emergency Medicine

## 2014-09-24 ENCOUNTER — Inpatient Hospital Stay (HOSPITAL_COMMUNITY): Payer: 59

## 2014-09-24 ENCOUNTER — Encounter (HOSPITAL_COMMUNITY): Payer: Self-pay | Admitting: *Deleted

## 2014-09-24 ENCOUNTER — Encounter (HOSPITAL_COMMUNITY)
Admission: RE | Disposition: A | Discharge status: Home / Self Care | Payer: Self-pay | Source: Ambulatory Visit | Attending: Neurosurgery

## 2014-09-24 ENCOUNTER — Inpatient Hospital Stay (HOSPITAL_COMMUNITY)
Admission: RE | Admit: 2014-09-24 | Discharge: 2014-09-25 | DRG: 460 | Disposition: A | Discharge status: Home / Self Care | Payer: 59 | Source: Ambulatory Visit | Attending: Neurosurgery | Admitting: Neurosurgery

## 2014-09-24 ENCOUNTER — Inpatient Hospital Stay (HOSPITAL_COMMUNITY): Payer: 59 | Admitting: Anesthesiology

## 2014-09-24 DIAGNOSIS — M4317 Spondylolisthesis, lumbosacral region: Secondary | ICD-10-CM | POA: Diagnosis present

## 2014-09-24 DIAGNOSIS — Z6839 Body mass index (BMI) 39.0-39.9, adult: Secondary | ICD-10-CM

## 2014-09-24 DIAGNOSIS — F419 Anxiety disorder, unspecified: Secondary | ICD-10-CM | POA: Diagnosis present

## 2014-09-24 DIAGNOSIS — Z87891 Personal history of nicotine dependence: Secondary | ICD-10-CM

## 2014-09-24 DIAGNOSIS — M4326 Fusion of spine, lumbar region: Secondary | ICD-10-CM

## 2014-09-24 DIAGNOSIS — M4316 Spondylolisthesis, lumbar region: Secondary | ICD-10-CM | POA: Diagnosis present

## 2014-09-24 DIAGNOSIS — M549 Dorsalgia, unspecified: Secondary | ICD-10-CM | POA: Diagnosis present

## 2014-09-24 DIAGNOSIS — I10 Essential (primary) hypertension: Secondary | ICD-10-CM | POA: Diagnosis present

## 2014-09-24 DIAGNOSIS — E669 Obesity, unspecified: Secondary | ICD-10-CM | POA: Diagnosis present

## 2014-09-24 HISTORY — PX: MAXIMUM ACCESS (MAS)POSTERIOR LUMBAR INTERBODY FUSION (PLIF) 1 LEVEL: SHX6368

## 2014-09-24 LAB — TYPE AND SCREEN
ABO/RH(D): O POS
Antibody Screen: NEGATIVE

## 2014-09-24 LAB — ABO/RH: ABO/RH(D): O POS

## 2014-09-24 SURGERY — FOR MAXIMUM ACCESS (MAS) POSTERIOR LUMBAR INTERBODY FUSION (PLIF) 1 LEVEL
Anesthesia: General | Site: Back

## 2014-09-24 MED ORDER — POLYETHYLENE GLYCOL 3350 17 G PO PACK
17.0000 g | PACK | Freq: Every day | ORAL | Status: DC | PRN
  Filled 2014-09-24: qty 1

## 2014-09-24 MED ORDER — SODIUM CHLORIDE 0.9 % IJ SOLN: 3.0000 mL | Freq: Two times a day (BID) | INTRAMUSCULAR | Status: DC

## 2014-09-24 MED ORDER — CEFAZOLIN SODIUM-DEXTROSE 2-3 GM-% IV SOLR
2.0000 g | Freq: Three times a day (TID) | INTRAVENOUS | Status: AC
  Administered 2014-09-24 (×2): 2 g via INTRAVENOUS
  Filled 2014-09-24 (×2): qty 50

## 2014-09-24 MED ORDER — OXYCODONE-ACETAMINOPHEN 5-325 MG PO TABS
1.0000 | ORAL_TABLET | ORAL | Status: DC | PRN
  Administered 2014-09-24 – 2014-09-25 (×4): 2 via ORAL
  Administered 2014-09-25: 1 via ORAL
  Filled 2014-09-24 (×2): qty 2
  Filled 2014-09-24: qty 1
  Filled 2014-09-24 (×2): qty 2

## 2014-09-24 MED ORDER — PHENYLEPHRINE 40 MCG/ML (10ML) SYRINGE FOR IV PUSH (FOR BLOOD PRESSURE SUPPORT)
PREFILLED_SYRINGE | INTRAVENOUS | Status: AC
  Filled 2014-09-24: qty 10

## 2014-09-24 MED ORDER — FENTANYL CITRATE (PF) 100 MCG/2ML IJ SOLN
25.0000 ug | INTRAMUSCULAR | Status: DC | PRN
  Administered 2014-09-24 (×2): 25 ug via INTRAVENOUS
  Administered 2014-09-24 (×2): 50 ug via INTRAVENOUS

## 2014-09-24 MED ORDER — HYDROMORPHONE HCL 1 MG/ML IJ SOLN
INTRAMUSCULAR | Status: AC
  Filled 2014-09-24: qty 1

## 2014-09-24 MED ORDER — PANTOPRAZOLE SODIUM 40 MG IV SOLR
40.0000 mg | Freq: Every day | INTRAVENOUS | Status: DC
  Filled 2014-09-24: qty 40

## 2014-09-24 MED ORDER — FENTANYL CITRATE (PF) 100 MCG/2ML IJ SOLN
INTRAMUSCULAR | Status: AC
  Filled 2014-09-24: qty 2

## 2014-09-24 MED ORDER — SODIUM CHLORIDE 0.9 % IJ SOLN
INTRAMUSCULAR | Status: AC
  Filled 2014-09-24: qty 10

## 2014-09-24 MED ORDER — BUPIVACAINE HCL (PF) 0.5 % IJ SOLN
INTRAMUSCULAR | Status: DC | PRN
  Administered 2014-09-24: 5 mL

## 2014-09-24 MED ORDER — EPHEDRINE SULFATE 50 MG/ML IJ SOLN
INTRAMUSCULAR | Status: DC | PRN
  Administered 2014-09-24: 5 mg via INTRAVENOUS

## 2014-09-24 MED ORDER — 0.9 % SODIUM CHLORIDE (POUR BTL) OPTIME
TOPICAL | Status: DC | PRN
  Administered 2014-09-24: 1000 mL

## 2014-09-24 MED ORDER — GLYCOPYRROLATE 0.2 MG/ML IJ SOLN
INTRAMUSCULAR | Status: AC
  Filled 2014-09-24: qty 3

## 2014-09-24 MED ORDER — SODIUM CHLORIDE 0.9 % IJ SOLN: 3.0000 mL | INTRAMUSCULAR | Status: DC | PRN

## 2014-09-24 MED ORDER — OMEGA-3 KRILL OIL 300 MG PO CAPS: 353.0000 mg | ORAL_CAPSULE | Freq: Every day | ORAL | Status: DC

## 2014-09-24 MED ORDER — PROPOFOL 10 MG/ML IV BOLUS
INTRAVENOUS | Status: DC | PRN
  Administered 2014-09-24: 30 mg via INTRAVENOUS
  Administered 2014-09-24: 20 mg via INTRAVENOUS
  Administered 2014-09-24: 170 mg via INTRAVENOUS

## 2014-09-24 MED ORDER — FLEET ENEMA 7-19 GM/118ML RE ENEM
1.0000 | ENEMA | Freq: Once | RECTAL | Status: AC | PRN
  Filled 2014-09-24: qty 1

## 2014-09-24 MED ORDER — ACETAMINOPHEN 325 MG PO TABS: 650.0000 mg | ORAL_TABLET | ORAL | Status: DC | PRN

## 2014-09-24 MED ORDER — PHENOL 1.4 % MT LIQD
1.0000 | OROMUCOSAL | Status: DC | PRN
  Administered 2014-09-24: 1 via OROMUCOSAL
  Filled 2014-09-24: qty 177

## 2014-09-24 MED ORDER — PROPOFOL INFUSION 10 MG/ML OPTIME
INTRAVENOUS | Status: DC | PRN
  Administered 2014-09-24: 50 ug/kg/min via INTRAVENOUS

## 2014-09-24 MED ORDER — METHOCARBAMOL 500 MG PO TABS
ORAL_TABLET | ORAL | Status: AC
  Filled 2014-09-24: qty 1

## 2014-09-24 MED ORDER — THROMBIN 20000 UNITS EX SOLR
CUTANEOUS | Status: DC | PRN
  Administered 2014-09-24: 20 mL via TOPICAL

## 2014-09-24 MED ORDER — CHLORTHALIDONE 25 MG PO TABS
25.0000 mg | ORAL_TABLET | Freq: Every day | ORAL | Status: DC
  Administered 2014-09-24: 25 mg via ORAL
  Filled 2014-09-24 (×2): qty 1

## 2014-09-24 MED ORDER — MIDAZOLAM HCL 2 MG/2ML IJ SOLN
INTRAMUSCULAR | Status: AC
  Filled 2014-09-24: qty 2

## 2014-09-24 MED ORDER — MENTHOL 3 MG MT LOZG
1.0000 | LOZENGE | OROMUCOSAL | Status: DC | PRN
  Filled 2014-09-24: qty 9

## 2014-09-24 MED ORDER — BUPIVACAINE LIPOSOME 1.3 % IJ SUSP
INTRAMUSCULAR | Status: DC | PRN
  Administered 2014-09-24: 20 mL

## 2014-09-24 MED ORDER — EPHEDRINE SULFATE 50 MG/ML IJ SOLN
INTRAMUSCULAR | Status: AC
  Filled 2014-09-24: qty 1

## 2014-09-24 MED ORDER — PROMETHAZINE HCL 25 MG/ML IJ SOLN: 6.2500 mg | INTRAMUSCULAR | Status: DC | PRN

## 2014-09-24 MED ORDER — LIDOCAINE HCL (CARDIAC) 20 MG/ML IV SOLN
INTRAVENOUS | Status: AC
  Filled 2014-09-24: qty 5

## 2014-09-24 MED ORDER — SUCCINYLCHOLINE CHLORIDE 20 MG/ML IJ SOLN
INTRAMUSCULAR | Status: DC | PRN
  Administered 2014-09-24: 120 mg via INTRAVENOUS

## 2014-09-24 MED ORDER — FENTANYL CITRATE (PF) 100 MCG/2ML IJ SOLN
50.0000 ug | Freq: Once | INTRAMUSCULAR | Status: AC
Start: 2014-09-24 — End: 2014-09-24
  Administered 2014-09-24: 50 ug via INTRAVENOUS

## 2014-09-24 MED ORDER — ONDANSETRON HCL 4 MG/2ML IJ SOLN
4.0000 mg | INTRAMUSCULAR | Status: DC | PRN
  Administered 2014-09-24: 4 mg via INTRAVENOUS
  Filled 2014-09-24: qty 2

## 2014-09-24 MED ORDER — TRAMADOL HCL 50 MG PO TABS
50.0000 mg | ORAL_TABLET | Freq: Three times a day (TID) | ORAL | Status: DC | PRN
Start: 2014-09-24 — End: 2014-09-25

## 2014-09-24 MED ORDER — ACETAMINOPHEN 650 MG RE SUPP: 650.0000 mg | RECTAL | Status: DC | PRN

## 2014-09-24 MED ORDER — ALUM & MAG HYDROXIDE-SIMETH 200-200-20 MG/5ML PO SUSP: 30.0000 mL | Freq: Four times a day (QID) | ORAL | Status: DC | PRN

## 2014-09-24 MED ORDER — LIDOCAINE HCL (CARDIAC) 20 MG/ML IV SOLN
INTRAVENOUS | Status: DC | PRN
  Administered 2014-09-24: 100 mg via INTRAVENOUS

## 2014-09-24 MED ORDER — LACTATED RINGERS IV SOLN
INTRAVENOUS | Status: DC | PRN
  Administered 2014-09-24 (×3): via INTRAVENOUS

## 2014-09-24 MED ORDER — DEXTROSE 5 % IV SOLN
500.0000 mg | Freq: Four times a day (QID) | INTRAVENOUS | Status: DC | PRN
  Filled 2014-09-24: qty 5

## 2014-09-24 MED ORDER — PHENYLEPHRINE HCL 10 MG/ML IJ SOLN
INTRAMUSCULAR | Status: AC
  Filled 2014-09-24: qty 1

## 2014-09-24 MED ORDER — DOCUSATE SODIUM 100 MG PO CAPS
100.0000 mg | ORAL_CAPSULE | Freq: Two times a day (BID) | ORAL | Status: DC
  Administered 2014-09-24 (×2): 100 mg via ORAL
  Filled 2014-09-24 (×2): qty 1

## 2014-09-24 MED ORDER — KCL IN DEXTROSE-NACL 20-5-0.45 MEQ/L-%-% IV SOLN
INTRAVENOUS | Status: DC
  Filled 2014-09-24 (×3): qty 1000

## 2014-09-24 MED ORDER — METHOCARBAMOL 500 MG PO TABS
500.0000 mg | ORAL_TABLET | Freq: Four times a day (QID) | ORAL | Status: DC | PRN
  Administered 2014-09-24 – 2014-09-25 (×4): 500 mg via ORAL
  Filled 2014-09-24 (×3): qty 1

## 2014-09-24 MED ORDER — SODIUM CHLORIDE 0.9 % IV SOLN: 250.0000 mL | INTRAVENOUS | Status: DC

## 2014-09-24 MED ORDER — DIPHENHYDRAMINE HCL 50 MG/ML IJ SOLN
INTRAMUSCULAR | Status: DC | PRN
  Administered 2014-09-24: 25 mg via INTRAVENOUS

## 2014-09-24 MED ORDER — HYDROMORPHONE HCL 1 MG/ML IJ SOLN
0.5000 mg | INTRAMUSCULAR | Status: DC | PRN
Start: 2014-09-24 — End: 2014-09-25
  Administered 2014-09-24 (×3): 1 mg via INTRAVENOUS
  Filled 2014-09-24 (×2): qty 1

## 2014-09-24 MED ORDER — PROPOFOL 10 MG/ML IV BOLUS
INTRAVENOUS | Status: AC
  Filled 2014-09-24: qty 20

## 2014-09-24 MED ORDER — ROCURONIUM BROMIDE 50 MG/5ML IV SOLN
INTRAVENOUS | Status: AC
  Filled 2014-09-24: qty 1

## 2014-09-24 MED ORDER — BUPIVACAINE LIPOSOME 1.3 % IJ SUSP
20.0000 mL | Freq: Once | INTRAMUSCULAR | Status: DC
  Filled 2014-09-24: qty 20

## 2014-09-24 MED ORDER — LORATADINE 10 MG PO TABS
10.0000 mg | ORAL_TABLET | Freq: Every day | ORAL | Status: DC
  Filled 2014-09-24: qty 1

## 2014-09-24 MED ORDER — FENTANYL CITRATE (PF) 100 MCG/2ML IJ SOLN
INTRAMUSCULAR | Status: DC | PRN
  Administered 2014-09-24 (×3): 50 ug via INTRAVENOUS
  Administered 2014-09-24: 100 ug via INTRAVENOUS

## 2014-09-24 MED ORDER — MIDAZOLAM HCL 5 MG/5ML IJ SOLN
INTRAMUSCULAR | Status: DC | PRN
  Administered 2014-09-24: 2 mg via INTRAVENOUS

## 2014-09-24 MED ORDER — BISACODYL 10 MG RE SUPP: 10.0000 mg | Freq: Every day | RECTAL | Status: DC | PRN

## 2014-09-24 MED ORDER — ARTIFICIAL TEARS OP OINT
TOPICAL_OINTMENT | OPHTHALMIC | Status: AC
  Filled 2014-09-24: qty 3.5

## 2014-09-24 MED ORDER — SUCCINYLCHOLINE CHLORIDE 20 MG/ML IJ SOLN
INTRAMUSCULAR | Status: AC
  Filled 2014-09-24: qty 1

## 2014-09-24 MED ORDER — NEOSTIGMINE METHYLSULFATE 10 MG/10ML IV SOLN
INTRAVENOUS | Status: AC
  Filled 2014-09-24: qty 1

## 2014-09-24 MED ORDER — ESTROGENS CONJUGATED 0.625 MG PO TABS
0.6250 mg | ORAL_TABLET | Freq: Every day | ORAL | Status: DC
  Administered 2014-09-24: 0.625 mg via ORAL
  Filled 2014-09-24 (×2): qty 1

## 2014-09-24 MED ORDER — MULTI-VITAMIN/MINERALS PO TABS
1.0000 | ORAL_TABLET | ORAL | Status: DC
  Filled 2014-09-24: qty 1

## 2014-09-24 MED ORDER — OMEGA-3-ACID ETHYL ESTERS 1 G PO CAPS
1.0000 g | ORAL_CAPSULE | Freq: Every day | ORAL | Status: DC
  Administered 2014-09-24: 1 g via ORAL
  Filled 2014-09-24 (×2): qty 1

## 2014-09-24 MED ORDER — PHENYLEPHRINE HCL 10 MG/ML IJ SOLN
INTRAMUSCULAR | Status: DC | PRN
  Administered 2014-09-24: 80 ug via INTRAVENOUS

## 2014-09-24 MED ORDER — ARTIFICIAL TEARS OP OINT
TOPICAL_OINTMENT | OPHTHALMIC | Status: DC | PRN
  Administered 2014-09-24: 1 via OPHTHALMIC

## 2014-09-24 MED ORDER — MEPERIDINE HCL 25 MG/ML IJ SOLN: 6.2500 mg | INTRAMUSCULAR | Status: DC | PRN

## 2014-09-24 MED ORDER — LIDOCAINE-EPINEPHRINE 1 %-1:100000 IJ SOLN
INTRAMUSCULAR | Status: DC | PRN
  Administered 2014-09-24: 5 mL

## 2014-09-24 MED ORDER — PANTOPRAZOLE SODIUM 40 MG PO TBEC
40.0000 mg | DELAYED_RELEASE_TABLET | Freq: Every day | ORAL | Status: DC
  Administered 2014-09-24: 40 mg via ORAL
  Filled 2014-09-24: qty 1

## 2014-09-24 MED ORDER — DEXAMETHASONE SODIUM PHOSPHATE 4 MG/ML IJ SOLN
INTRAMUSCULAR | Status: AC
  Filled 2014-09-24: qty 2

## 2014-09-24 MED ORDER — FENTANYL CITRATE (PF) 250 MCG/5ML IJ SOLN
INTRAMUSCULAR | Status: AC
  Filled 2014-09-24: qty 5

## 2014-09-24 MED ORDER — ONDANSETRON HCL 4 MG/2ML IJ SOLN
INTRAMUSCULAR | Status: AC
  Filled 2014-09-24: qty 2

## 2014-09-24 MED ORDER — SODIUM CHLORIDE 0.9 % IV SOLN
10.0000 mg | INTRAVENOUS | Status: DC | PRN
  Administered 2014-09-24: 10 ug/min via INTRAVENOUS

## 2014-09-24 MED ORDER — HYDROCODONE-ACETAMINOPHEN 5-325 MG PO TABS: 1.0000 | ORAL_TABLET | ORAL | Status: DC | PRN

## 2014-09-24 MED ORDER — POTASSIUM CHLORIDE CRYS ER 20 MEQ PO TBCR
20.0000 meq | EXTENDED_RELEASE_TABLET | Freq: Two times a day (BID) | ORAL | Status: DC
  Administered 2014-09-24 (×2): 20 meq via ORAL
  Filled 2014-09-24 (×4): qty 1

## 2014-09-24 SURGICAL SUPPLY — 89 items
ADH SKN CLS APL DERMABOND .7 (GAUZE/BANDAGES/DRESSINGS) ×1
ADH SKN CLS LQ APL DERMABOND (GAUZE/BANDAGES/DRESSINGS) ×1
APL SKNCLS STERI-STRIP NONHPOA (GAUZE/BANDAGES/DRESSINGS) ×1
BENZOIN TINCTURE PRP APPL 2/3 (GAUZE/BANDAGES/DRESSINGS) ×2 IMPLANT
BLADE CLIPPER SURG (BLADE) IMPLANT
BONE MATRIX OSTEOCEL PRO MED (Bone Implant) ×1 IMPLANT
BUR MATCHSTICK NEURO 3.0 LAGG (BURR) ×2 IMPLANT
BUR ROUND FLUTED 5 RND (BURR) ×2 IMPLANT
CAGE COROENT LG 10X9X23-12 (Cage) ×2 IMPLANT
CANISTER SUCT 3000ML PPV (MISCELLANEOUS) ×2 IMPLANT
CLIP NEUROVISION LG (CLIP) ×1 IMPLANT
CONT SPEC 4OZ CLIKSEAL STRL BL (MISCELLANEOUS) ×4 IMPLANT
COVER BACK TABLE 24X17X13 BIG (DRAPES) IMPLANT
COVER BACK TABLE 60X90IN (DRAPES) ×2 IMPLANT
DECANTER SPIKE VIAL GLASS SM (MISCELLANEOUS) ×2 IMPLANT
DERMABOND ADHESIVE PROPEN (GAUZE/BANDAGES/DRESSINGS) ×1
DERMABOND ADVANCED (GAUZE/BANDAGES/DRESSINGS) ×1
DERMABOND ADVANCED .7 DNX12 (GAUZE/BANDAGES/DRESSINGS) ×1 IMPLANT
DERMABOND ADVANCED .7 DNX6 (GAUZE/BANDAGES/DRESSINGS) IMPLANT
DRAPE C-ARM 42X72 X-RAY (DRAPES) ×2 IMPLANT
DRAPE C-ARMOR (DRAPES) ×2 IMPLANT
DRAPE LAPAROTOMY 100X72X124 (DRAPES) ×2 IMPLANT
DRAPE POUCH INSTRU U-SHP 10X18 (DRAPES) ×2 IMPLANT
DRAPE SURG 17X23 STRL (DRAPES) ×2 IMPLANT
DRSG OPSITE POSTOP 4X6 (GAUZE/BANDAGES/DRESSINGS) ×1 IMPLANT
DRSG TELFA 3X8 NADH (GAUZE/BANDAGES/DRESSINGS) ×2 IMPLANT
DURAPREP 26ML APPLICATOR (WOUND CARE) ×2 IMPLANT
ELECT BLADE 4.0 EZ CLEAN MEGAD (MISCELLANEOUS) ×2
ELECT REM PT RETURN 9FT ADLT (ELECTROSURGICAL) ×2
ELECTRODE BLDE 4.0 EZ CLN MEGD (MISCELLANEOUS) IMPLANT
ELECTRODE REM PT RTRN 9FT ADLT (ELECTROSURGICAL) ×1 IMPLANT
EVACUATOR 1/8 PVC DRAIN (DRAIN) ×2 IMPLANT
GAUZE SPONGE 4X4 12PLY STRL (GAUZE/BANDAGES/DRESSINGS) ×2 IMPLANT
GAUZE SPONGE 4X4 16PLY XRAY LF (GAUZE/BANDAGES/DRESSINGS) IMPLANT
GLOVE BIO SURGEON STRL SZ8 (GLOVE) ×2 IMPLANT
GLOVE BIOGEL PI IND STRL 8 (GLOVE) ×1 IMPLANT
GLOVE BIOGEL PI IND STRL 8.5 (GLOVE) ×1 IMPLANT
GLOVE BIOGEL PI INDICATOR 8 (GLOVE) ×1
GLOVE BIOGEL PI INDICATOR 8.5 (GLOVE) ×1
GLOVE ECLIPSE 8.0 STRL XLNG CF (GLOVE) ×2 IMPLANT
GLOVE EXAM NITRILE LRG STRL (GLOVE) IMPLANT
GLOVE EXAM NITRILE MD LF STRL (GLOVE) IMPLANT
GLOVE EXAM NITRILE XL STR (GLOVE) IMPLANT
GLOVE EXAM NITRILE XS STR PU (GLOVE) IMPLANT
GOWN STRL REUS W/ TWL LRG LVL3 (GOWN DISPOSABLE) IMPLANT
GOWN STRL REUS W/ TWL XL LVL3 (GOWN DISPOSABLE) ×1 IMPLANT
GOWN STRL REUS W/TWL 2XL LVL3 (GOWN DISPOSABLE) ×2 IMPLANT
GOWN STRL REUS W/TWL LRG LVL3 (GOWN DISPOSABLE)
GOWN STRL REUS W/TWL XL LVL3 (GOWN DISPOSABLE) ×2
KIT BASIN OR (CUSTOM PROCEDURE TRAY) ×2 IMPLANT
KIT NDL NVM5 EMG ELECT (KITS) IMPLANT
KIT NEEDLE NVM5 EMG ELECT (KITS) ×1 IMPLANT
KIT NEEDLE NVM5 EMG ELECTRODE (KITS) ×1
KIT POSITION SURG JACKSON T1 (MISCELLANEOUS) ×2 IMPLANT
KIT ROOM TURNOVER OR (KITS) ×2 IMPLANT
MILL MEDIUM DISP (BLADE) ×2 IMPLANT
NDL HYPO 25X1 1.5 SAFETY (NEEDLE) ×1 IMPLANT
NDL SPNL 18GX3.5 QUINCKE PK (NEEDLE) IMPLANT
NEEDLE HYPO 25X1 1.5 SAFETY (NEEDLE) ×2 IMPLANT
NEEDLE SPNL 18GX3.5 QUINCKE PK (NEEDLE) IMPLANT
NS IRRIG 1000ML POUR BTL (IV SOLUTION) ×2 IMPLANT
PACK LAMINECTOMY NEURO (CUSTOM PROCEDURE TRAY) ×2 IMPLANT
PAD ARMBOARD 7.5X6 YLW CONV (MISCELLANEOUS) ×6 IMPLANT
PAD DRESSING TELFA 3X8 NADH (GAUZE/BANDAGES/DRESSINGS) ×1 IMPLANT
PATTIES SURGICAL .5 X.5 (GAUZE/BANDAGES/DRESSINGS) IMPLANT
PATTIES SURGICAL .5 X1 (DISPOSABLE) IMPLANT
PATTIES SURGICAL 1X1 (DISPOSABLE) IMPLANT
ROD 35MM (Rod) ×2 IMPLANT
SCREW LOCK (Screw) ×8 IMPLANT
SCREW LOCK FXNS SPNE MAS PL (Screw) IMPLANT
SCREW SHANK 5.0X30MM (Screw) ×2 IMPLANT
SCREW SHANKS 5.5X35 (Screw) ×2 IMPLANT
SCREW TULIP 5.5 (Screw) ×2 IMPLANT
SPONGE LAP 4X18 X RAY DECT (DISPOSABLE) IMPLANT
SPONGE SURGIFOAM ABS GEL 100 (HEMOSTASIS) ×2 IMPLANT
STAPLER SKIN PROX WIDE 3.9 (STAPLE) IMPLANT
STRIP CLOSURE SKIN 1/2X4 (GAUZE/BANDAGES/DRESSINGS) ×2 IMPLANT
SUT VIC AB 1 CT1 18XBRD ANBCTR (SUTURE) ×2 IMPLANT
SUT VIC AB 1 CT1 8-18 (SUTURE) ×4
SUT VIC AB 2-0 CT1 18 (SUTURE) ×4 IMPLANT
SUT VIC AB 3-0 SH 8-18 (SUTURE) ×4 IMPLANT
SYR 20ML ECCENTRIC (SYRINGE) ×2 IMPLANT
SYR 5ML LL (SYRINGE) IMPLANT
TAPE STRIPS DRAPE STRL (GAUZE/BANDAGES/DRESSINGS) ×1 IMPLANT
TOWEL OR 17X24 6PK STRL BLUE (TOWEL DISPOSABLE) ×2 IMPLANT
TOWEL OR 17X26 10 PK STRL BLUE (TOWEL DISPOSABLE) ×2 IMPLANT
TRAP SPECIMEN MUCOUS 40CC (MISCELLANEOUS) ×2 IMPLANT
TRAY FOLEY W/METER SILVER 14FR (SET/KITS/TRAYS/PACK) ×2 IMPLANT
WATER STERILE IRR 1000ML POUR (IV SOLUTION) ×2 IMPLANT

## 2014-09-24 NOTE — Care Management (Signed)
Utilization review completed by Verlee Rossetti, RN BSN [phone]

## 2014-09-24 NOTE — Brief Op Note (Signed)
09/24/2014  11:18 AM  PATIENT:  Sherri Sullivan  54 y.o. female  PRE-OPERATIVE DIAGNOSIS:  Spondylolisthesis, Lumbosacral region; Spinal stenosis of lumbosacral region; Lumbar radiculopathy L 5 S 1  POST-OPERATIVE DIAGNOSIS:  Spondylolisthesis, Lumbosacral region; Spinal stenosis of lumbosacral region; Lumbar radiculopathy L 5 S 1  PROCEDURE:  Procedure(s) with comments: L5-S1 MAS PLIF  (N/A) - L5-S1 MAS PLIF fusion with autograft, PEEK interbody cages, pedicle screw fixation, posterolateral arthrodesis with autograft  Decompression greater than for standard PLIF procedure    SURGEON:  Surgeon(s) and Role:    * Erline Levine, MD - Primary    * Karie Chimera, MD - Assisting  PHYSICIAN ASSISTANT:   ASSISTANTS: Poteat, RN   ANESTHESIA:   general  EBL:  Total I/O In: 2300 [I.V.:2300] Out: 450 [Urine:300; Blood:150]  BLOOD ADMINISTERED:none  DRAINS: none   LOCAL MEDICATIONS USED:  MARCAINE    and LIDOCAINE   SPECIMEN:  No Specimen  DISPOSITION OF SPECIMEN:  N/A  COUNTS:  YES  TOURNIQUET:  * No tourniquets in log *  DICTATION: Patient is a 54 year old with spondylosis , stenosis, spondylolisthesis, disc herniation and severe back and right greater than left  lower extremity pain at L 5 S 1 levels of the lumbar spine. It was elected to take her to surgery for MASPLIF L 5 S 1 level with posterolateral arthrodesis.  Procedure:   Following uncomplicated induction of GETA, and placement of electrodes for neural monitoring, patient was turned into a prone position on the [place] tableand using AP  fluoroscopy the area of planned incision was marked, prepped with betadine scrub and Duraprep, then draped. Exposure was performed of facet joint complex at L 5 S 1 level and the MAS retractor was placed.5.0 x 30 mm cortical Nuvasive screws were placed at L 5 bilaterally according to standard landmarks using neural monitoring.  A total laminectomy of L 5 was then performed with  disarticulation of facets.  This bone was saved for grafting, combined with Osteocel after being run through bone mill and was placed in bone packing device.  Thorough discectomy was performed bilaterally at L 5 S 1 and the endplates were prepared for grafting.  Decompression was greater than for standard PLIF procedure and was technically difficult because of patient's large body habitus and steep sacral inclination along with dense scarring around both S 1 nerve roots. 23 x 10 x 12 degree cages were placed in the interspace and positioning was confirmed with AP and lateral fluoroscopy.  10 cc of autograft/Osteocel was packed in the interspace medial to the second cage.   Remaining screws were placed at S 1 and 35 mm rods were placed.   And the screws were locked and torqued.Final Xrays showed well positioned implants and screw fixation. The posterolateral region was packed with remaining 10 cc of autograft on the right of midline. The wounds were irrigated and then closed with 1, 2-0 and 3-0 Vicryl stitches. Sterile occlusive dressing was placed with Dermabond. The patient was then extubated in the operating room and taken to recovery in stable and satisfactory condition having tolerated her operation well. Counts were correct at the end of the case.  PLAN OF CARE: Admit to inpatient   PATIENT DISPOSITION:  PACU - hemodynamically stable.   Delay start of Pharmacological VTE agent (>24hrs) due to surgical blood loss or risk of bleeding: yes

## 2014-09-24 NOTE — Progress Notes (Signed)
Awake, alert, conversant.  Full strength both lower extremities.  Doing well.

## 2014-09-24 NOTE — Transfer of Care (Signed)
Immediate Anesthesia Transfer of Care Note  Patient: Sherri Sullivan  Procedure(s) Performed: Procedure(s) with comments: L5-S1 MAS PLIF  (N/A) - L5-S1 MAS PLIF fusion  Patient Location: PACU  Anesthesia Type:General  Level of Consciousness: awake, alert , oriented and patient cooperative  Airway & Oxygen Therapy: Patient Spontanous Breathing and Patient connected to face mask oxygen  Post-op Assessment: Report given to RN, Post -op Vital signs reviewed and stable and Patient moving all extremities X 4  Post vital signs: Reviewed and stable  Last Vitals:  Filed Vitals:   09/24/14 0622  BP: 127/74  Pulse: 73  Temp: 36.9 C  Resp: 20    Complications: No apparent anesthesia complications

## 2014-09-24 NOTE — Interval H&P Note (Signed)
History and Physical Interval Note:  09/24/2014 7:23 AM  Sherri Sullivan  has presented today for surgery, with the diagnosis of Spondylolisthesis, Lumbosacral region; Spinal stenosis of lumbosacral region; Lumbar radiculopathy  The various methods of treatment have been discussed with the patient and family. After consideration of risks, benefits and other options for treatment, the patient has consented to  Procedure(s) with comments: L5-S1 MAS PLIF  (N/A) - L5-S1 MAS PLIF fusion as a surgical intervention .  The patient's history has been reviewed, patient examined, no change in status, stable for surgery.  I have reviewed the patient's chart and labs.  Questions were answered to the patient's satisfaction.     Ariba Lehnen D

## 2014-09-24 NOTE — Clinical Social Work Note (Signed)
Clinical Social Worker received referral for possible ST-SNF placement.  Chart reviewed.  OT recommending no follow up with intermittent supervision.  Spoke with RN Case Manager who will follow up with patient to discuss home health needs if deemed appropriate by PT.    CSW signing off - please re consult if social work needs arise.  Barbette Or, [place]

## 2014-09-24 NOTE — Op Note (Signed)
09/24/2014  11:18 AM  Sherri Sullivan:  Sherri Sullivan  54 y.o. female  PRE-OPERATIVE DIAGNOSIS:  Spondylolisthesis, Lumbosacral region; Spinal stenosis of lumbosacral region; Lumbar radiculopathy L 5 S 1  POST-OPERATIVE DIAGNOSIS:  Spondylolisthesis, Lumbosacral region; Spinal stenosis of lumbosacral region; Lumbar radiculopathy L 5 S 1  PROCEDURE:  Procedure(s) with comments: L5-S1 MAS PLIF  (N/A) - L5-S1 MAS PLIF fusion with autograft, PEEK interbody cages, pedicle screw fixation, posterolateral arthrodesis with autograft  Decompression greater than for standard PLIF procedure    SURGEON:  Surgeon(s) and Role:    * Erline Levine, MD - Primary    * Karie Chimera, MD - Assisting  PHYSICIAN ASSISTANT:   ASSISTANTS: Poteat, RN   ANESTHESIA:   general  EBL:  Total I/O In: 2300 [I.V.:2300] Out: 450 [Urine:300; Blood:150]  BLOOD ADMINISTERED:none  DRAINS: none   LOCAL MEDICATIONS USED:  MARCAINE    and LIDOCAINE   SPECIMEN:  No Specimen  DISPOSITION OF SPECIMEN:  N/A  COUNTS:  YES  TOURNIQUET:  * No tourniquets in log *  DICTATION: Sherri Sullivan is a 53 year old with spondylosis , stenosis, spondylolisthesis, disc herniation and severe back and right greater than left  lower extremity pain at L 5 S 1 levels of the lumbar spine. It was elected to take her to surgery for MASPLIF L 5 S 1 level with posterolateral arthrodesis.  Procedure:   Following uncomplicated induction of GETA, and placement of electrodes for neural monitoring, Sherri Sullivan was turned into a prone position on the [place] tableand using AP  fluoroscopy the area of planned incision was marked, prepped with betadine scrub and Duraprep, then draped. Exposure was performed of facet joint complex at L 5 S 1 level and the MAS retractor was placed.5.0 x 30 mm cortical Nuvasive screws were placed at L 5 bilaterally according to standard landmarks using neural monitoring.  A total laminectomy of L 5 was then performed with  disarticulation of facets.  This bone was saved for grafting, combined with Osteocel after being run through bone mill and was placed in bone packing device.  Thorough discectomy was performed bilaterally at L 5 S 1 and the endplates were prepared for grafting.  Decompression was greater than for standard PLIF procedure and was technically difficult because of Sherri Sullivan's large body habitus and steep sacral inclination along with dense scarring around both S 1 nerve roots. 23 x 10 x 12 degree cages were placed in the interspace and positioning was confirmed with AP and lateral fluoroscopy.  10 cc of autograft/Osteocel was packed in the interspace medial to the second cage.   Remaining screws were placed at S 1 and 35 mm rods were placed.   And the screws were locked and torqued.Final Xrays showed well positioned implants and screw fixation. The posterolateral region was packed with remaining 10 cc of autograft on the right of midline. The wounds were irrigated and then closed with 1, 2-0 and 3-0 Vicryl stitches. Sterile occlusive dressing was placed with Dermabond. The Sherri Sullivan was then extubated in the operating room and taken to recovery in stable and satisfactory condition having tolerated her operation well. Counts were correct at the end of the case.  PLAN OF CARE: Admit to inpatient   Sherri Sullivan DISPOSITION:  PACU - hemodynamically stable.   Delay start of Pharmacological VTE agent (>24hrs) due to surgical blood loss or risk of bleeding: yes

## 2014-09-24 NOTE — H&P (Signed)
Patient ID:   [id number] Patient: Sherri Sullivan  Date of Birth: April 02, 1960 Visit Type: Office Visit   Date: 07/10/2014 03:30 PM Provider: Marchia Meiers. Vertell Limber MD   This 54 year old female presents for back pain and Leg pain.  History of Present Illness: 1.  back pain  2.  Leg pain  Patient returns to review her MRI.  Interestingly the lumbar MRI does not show severe pathology other than significant degeneration with annular tearing at the L5-S1 interspace.  There is extraforaminal compressive pathology on the right which is likely affecting the right L5 nerve root.  However on review of her lumbar radiographs the show mobile spondylolisthesis of L5 on S1 of up to 11 mm.  Given these findings I think that her complaints of bilateral lower extremity pain right greater than left with weakness on the right in dorsiflexion and EHL at 4-5 are consistent with a right L5 radiculopathy secondary to her spondylolisthesis of L5 on S1.  The patient describes her pain is worsening and she is miserable.  She wants to go ahead and get relief.  I therefore recommended proceeding with L5-S1 decompression and fusion using MAS PLIF technique.  Patient understands risks and benefits and wishes to proceed.  She wants to go ahead and Sherri Sullivan.  I told her the importance of weight control.  She will be fitted for a lumbosacral orthosis.      Medical/Surgical/Interim History Reviewed, no change.  Last detailed document date:05/22/2014.   PAST MEDICAL HISTORY, SURGICAL HISTORY, FAMILY HISTORY, SOCIAL HISTORY AND REVIEW OF SYSTEMS I have reviewed the patient's past medical, surgical, family and social history as well as the comprehensive review of systems as included on the [facility] history form dated 05/22/2014, which I have signed.  Family History: Reviewed, no changes.  Last detailed document: 05/22/2014.   Social History: Tobacco use reviewed. Reviewed, no changes. Last detailed  document date: 05/22/2014.      MEDICATIONS(added, continued or stopped this visit): Started Medication Directions Instruction Stopped   chlorthalidone 25 mg tablet take 1 tablet by oral route  every day     klor-con M20 39mEq ORAL TABLET Take 1 tablet twice daily     Premarin 0.625 mg tablet take 1 tablet by oral route  every day     probiotic Take as directed     tramadol 50 mg tablet take 1 tablet by oral route  every 6 hours as needed     Vitamin B-12 Take as directed       ALLERGIES: Ingredient Reaction Medication Name Comment  NO KNOWN ALLERGIES     No known allergies.    Vitals Date Temp F BP Pulse Ht In Wt Lb BMI BSA Pain Score  07/10/2014  127/81 79 63 215 38.09  5/10      IMPRESSION L5-S1 mobile spondylolisthesis with right L5 radiculopathy  Assessment/Plan # Detail Type Description   1. Assessment Low back pain, unspecified back pain laterality, with sciatica presence unspecified (M54.5).       2. Assessment Spondylolisthesis, lumbosacral region (M43.17).       3. Assessment Spinal stenosis of lumbosacral region (M48.07).       4. Assessment Lumbar radiculopathy (M54.16).         Pain Assessment/Treatment Location: back/leg. Onset: 05/22/1998. Duration: varies. Quality: discomforting. Pain Assessment/Treatment follow-up plan of care: Patient is taking medications as prescribed..  Decompression and fusion L5-S1 level.  Orders: Diagnostic Procedures: Assessment Procedure  M43.17 PLIF - L5-S1 (  traditional)             Provider:  Marchia Meiers. Vertell Limber MD  07/13/2014 03:41 PM Dictation edited by: Marchia Meiers. Vertell Limber    CC Providers: [place] [address] [place] [address]-              Electronically signed by Marchia Meiers Vertell Limber MD on 07/13/2014 03:41 PM  Patient ID:   [id number] Patient: Sherri Sullivan  Date of Birth: 12/21/1960 Visit Type: Office Visit   Date: 05/22/2014 11:30 AM Provider: Marchia Meiers.  Vertell Limber MD   This 55 year old female presents for back pain and Leg pain.  History of Present Illness: 1.  back pain  2.  Leg pain  Sherri Sullivan, 55 year old female employed as a Data processing manager at [employer] since reporting right knee to calf pain with intermittent numbness toes of the right foot.  She also notes fingertip numbness in the right hand.  ESI's offered "some relief for about a year"  History: Hypertension Surgical history: 1996 hysterectomy; Sherri Sullivan 2013 atrial ablation  Tramadol 50 mg as taken rarely (sleepy)  Imaging on Canopy  Her lumbar MRI demonstrates a small herniated disc at the L5-S1 level on the right a stone peek 12/13 study.  Radiographs demonstrate facet arthropathy L4 L5 on the right.  There is evidence on plain radiographs of the lumbar spine with dynamic ending films that she has spondylolisthesis of L5 on S1 of the left millimeters a neutral lateral radiograph, 11 mm on flexion and 12 mm on extension.  Patient is complaining of pain in the lateral shin and toes on the second third and fourth toes into the top of her foot.  She has pain in her low back and right leg which she grades from 08-01-08 out of 10 in pain.  She denies left leg pain or weakness.        PAST MEDICAL/SURGICAL HISTORY   (Detailed)  Disease/disorder Onset Date Management Date Comments    Hysterectomy, total 1996   Hypertension         PAST MEDICAL HISTORY, SURGICAL HISTORY, FAMILY HISTORY, SOCIAL HISTORY AND REVIEW OF SYSTEMS I have reviewed the patient's past medical, surgical, family and social history as well as the comprehensive review of systems as included on the [facility] history form dated 05/22/2014, which I have signed.  Family History  (Detailed) Relationship Family Member Name Deceased Age at Death Condition Onset Age Cause of Death  Sherri Sullivan    Hypertension  N  Sherri Sullivan    Stroke  N  Sherri Sullivan    Diabetes mellitus  N    SOCIAL HISTORY   (Detailed) Tobacco use reviewed. Preferred language is Unknown.   Smoking status: Never smoker.  SMOKING STATUS Use Status Type Smoking Status Usage Per Day Years Used Total Pack Years  no/never  Never smoker             MEDICATIONS(added, continued or stopped this visit): Started Medication Directions Instruction Stopped   chlorthalidone 25 mg tablet take 1 tablet by oral route  every day     klor-con M20 33mEq ORAL TABLET Take 1 tablet twice daily     Premarin 0.625 mg tablet take 1 tablet by oral route  every day     probiotic Take as directed     tramadol 50 mg tablet take 1 tablet by oral route  every 6 hours as needed     Vitamin  B-12 Take as directed       ALLERGIES: Ingredient Reaction Medication Name Comment  NO KNOWN ALLERGIES     No known allergies.   REVIEW OF SYSTEMS System Neg/Pos Details  Constitutional Negative Chills, fatigue, fever, malaise, night sweats, weight gain and weight loss.  ENMT Negative Ear drainage, hearing loss, nasal drainage, otalgia, sinus pressure and sore throat.  Eyes Negative Eye discharge, eye pain and vision changes.  Respiratory Negative Chronic cough, cough, dyspnea, known TB exposure and wheezing.  Cardio Negative Chest pain, claudication, edema and irregular heartbeat/palpitations.  GI Negative Abdominal pain, blood in stool, change in stool pattern, constipation, decreased appetite, diarrhea, heartburn, nausea and vomiting.  GU Negative Dysuria, hematuria, hot flashes, irregular menses, polyuria, urinary frequency, urinary incontinence and urinary retention.  Endocrine Negative Cold intolerance, heat intolerance, polydipsia and polyphagia.  Neuro Positive Numbness in extremity.  Psych Negative Anxiety, depression and insomnia.  Integumentary Negative Brittle hair, brittle nails, change in shape/size of mole(s), hair loss, hirsutism, hives, pruritus, rash and skin lesion.  MS Positive RLE pain.  Hema/Lymph Negative Easy  bleeding, easy bruising and lymphadenopathy.  Allergic/Immuno Negative Contact allergy, environmental allergies, food allergies and seasonal allergies.  Reproductive Negative Breast discharge, breast lump(s), dysmenorrhea, dyspareunia, history of abnormal PAP smear and vaginal discharge.     Vitals Date Temp F BP Pulse Ht In Wt Lb BMI BSA Pain Score  05/22/2014  102/70 72 63 222 39.33  5/10     PHYSICAL EXAM General Level of Distress: no acute distress Overall Appearance: obese    Cardiovascular Cardiac: regular rate and rhythm without murmur  Respiratory Lungs: clear to auscultation  Neurological Recent and Remote Memory: normal Attention Span and Concentration:   normal Language: normal Fund of Knowledge: normal  Right Left Sensation: normal normal Upper Extremity Coordination: normal normal  Lower Extremity Coordination: normal normal  Musculoskeletal Gait and Station: normal  Right Left Upper Extremity Muscle Strength: normal normal Lower Extremity Muscle Strength: normal normal Upper Extremity Muscle Tone:  normal normal Lower Extremity Muscle Tone: normal normal  Motor Strength Upper and lower extremity motor strength was tested in the clinically pertinent muscles. Any abnormal findings will be noted below.   Right Left Tib Anterior: 4/5  EHL: 4-/5    Deep Tendon Reflexes  Right Left Biceps: normal normal Triceps: normal normal Brachiloradialis: normal normal Patellar: normal normal Achilles: normal normal  Sensory Sensation was tested at L1 to S1.   Cranial Nerves II. Optic Nerve/Visual Fields: normal III. Oculomotor: normal IV. Trochlear: normal V. Trigeminal: normal VI. Abducens: normal VII. Facial: normal VIII. Acoustic/Vestibular: normal IX. Glossopharyngeal: normal X. Vagus: normal XI. Spinal Accessory: normal XII. Hypoglossal: normal  Motor and other  Tests Lhermittes: negative Rhomberg: negative    Right Left Hoffman's: normal normal Clonus: normal normal Babinski: normal normal SLR: positive at 30 degrees negative Patrick's Corky Sox): negative negative Toe Walk: normal normal Toe Lift: normal normal Heel Walk: normal normal SI Joint: nontender nontender   Additional Findings:  Patient is able to stand on her heels and toes and then to touch her toes.  She does not have significant sciatic notch discomfort to palpation.  She has right hip abductor weakness at 4 out of 5.   DIAGNOSTIC RESULTS Diagnostic report text  *RADIOLOGY REPORT*  Clinical Data: Chronic back pain. Difficulty standing.  MRI LUMBAR SPINE WITHOUT CONTRAST  Technique: Multiplanar and multiecho pulse sequences of the lumbar spine were obtained without intravenous contrast.  Comparison: None.  Findings: The lowest  full intervertebral disk space is labeled L5- S1. If procedural intervention is to be performed, careful correlation with this numbering strategy is recommended.  The conus medullaris appears unremarkable. Conus level: L1.  Mild prominence of epidural adipose tissue noted in the lumbar spine. No significant vertebral subluxation.  Intervertebral disc desiccation is noted at L4-5 and L5-S1.  Low-level facet and perifacet edema noted at L4-5, believed to be secondary to degenerative facet arthropathy. Subtly increased inversion recovery weighted signal is noted eccentric to the left in the sacrum, only partially included on today's exam.  Left-sided peripelvic renal cysts noted.  Additional findings at individual levels are as follows:  L1-2: No impingement. Mild bulge.  L2-3: No impingement. Mild bulge.  L3-4: Mild central stenosis and borderline left subarticular lateral recess stenosis secondary to disc bulge and small central disc protrusion.  L4-5: Mild central stenosis due to disc bulge, shallow central disc protrusion, and  facet arthropathy.  L5-S1: No impingement. Facet arthropathy noted with broad but shallow right paracentral disc protrusion.  IMPRESSION:  1. Mild central stenosis at L3-4 and L4-5 due to degenerative disc disease and spondylosis as detailed above. 2. Degenerative facet and perifacet edema at L4-5. 3. Low-level edema eccentric to the left in the sacrum, nonspecific but possibly due to sacroiliitis.   Original Report Authenticated By: Van Clines, M.D.    IMPRESSION Patient has mobile spondylolisthesis of L5 on S1.  She has significant right L5 radiculopathy on examination.  She has not had recent lumbar imaging and I have recommended a new lumbar MRI be obtained.  Completed Orders (this encounter) Order Details Reason Side Interpretation Result Initial Treatment Date Region  Lifestyle education regarding diet Encouraged to eat a well balanced diet and follow up with primary care physician.        Lumbar Spine- AP/Lat/Obls/Spot/Flex/Ex      05/22/2014 All Levels to All Levels   Assessment/Plan # Detail Type Description   1. Assessment Spondylolisthesis, lumbosacral region (M43.17).       2. Assessment Spinal stenosis of lumbosacral region (M48.07).       3. Assessment Body mass index (BMI) 39.0-39.9, adult (Z68.39).   Plan Orders Today's instructions / counseling include(s) Lifestyle education regarding diet.       4. Assessment Lumbar radiculopathy (M54.16).         Pain Assessment/Treatment Pain Scale: 5/10. Method: Numeric Pain Intensity Scale. Location: back/right leg. Onset: 05/22/1998. Duration: varies. Quality: discomforting. Pain Assessment/Treatment follow-up plan of care: Patient is taking medications as prescribed..  She will return to see me after lumbar imaging has been performed.  Orders: Diagnostic Procedures: Assessment Procedure  M43.17 MRI Spine/lumb W/o Contrast  M43.17 Return to Clinic after study is performed  M54.16 Lumbar Spine-  AP/Lat/Obls/Spot/Flex/Ex  Instruction(s)/Education: Assessment Instruction  Z68.39 Lifestyle education regarding diet             Provider:  Marchia Meiers. Vertell Limber MD  05/25/2014 04:30 PM Dictation edited by: Marchia Meiers. Vertell Limber    CC Providers: [place] [address] [place] [address]-              Electronically signed by Marchia Meiers Vertell Limber MD on 05/25/2014 04:30 PM

## 2014-09-24 NOTE — Evaluation (Signed)
Occupational Therapy Evaluation Patient Details Name: Sherri Sullivan MRN: [id number] DOB: 1961/02/26 Today's Date: 09/24/2014    History of Present Illness 54 y.o. L5-S1 MAS PLIF fusion with autograft, PEEK interbody cages, pedicle screw fixation, posterolateral arthrodesis with autograft.   Clinical Impression   Pt s/p above. Pt independent with ADLs, PTA. Feel pt will benefit from acute OT to increase independence prior to d/c. Pt limited by dizziness today.    Follow Up Recommendations  No OT follow up;Supervision - Intermittent    Equipment Recommendations  3 in 1 bedside comode    Recommendations for Other Services       Precautions / Restrictions Precautions Precautions: Back;Fall Precaution Booklet Issued: Yes (comment) Precaution Comments: educated on back precautions Required Braces or Orthoses: Spinal Brace Spinal Brace: Lumbar corset;Applied in sitting position Restrictions Weight Bearing Restrictions: No      Mobility Bed Mobility Overal bed mobility: Needs Assistance Bed Mobility: Sidelying to Sit;Rolling;Sit to Sidelying Rolling: Supervision Sidelying to sit: Supervision     Sit to sidelying: Supervision General bed mobility comments: verbal and tactile cues for technique.  Transfers Overall transfer level: Needs assistance Equipment used:  (used spouse) Transfers: Sit to/from Stand Sit to Stand: Min assist         General transfer comment: cue for hand placement.    Balance  Pt holding to spouse when standing. Pt supporting self with bilateral UEs when sitting EOB.                                          ADL Overall ADL's : Needs assistance/impaired                 Upper Body Dressing : Moderate assistance;Sitting   Lower Body Dressing: Maximal assistance;Sit to/from stand   Toilet Transfer: Minimal assistance (sit to stand from bed)             General ADL Comments: Discussed incorporating precautions  into functional activities. Discussed options for shower chair and multiple uses of 3 in 1. Recommended sitting if pt was going to wash her LB. Recommended someone be with her for tub transfer/showering. Educated on back brace. Discussed how to use toilet aide and suggested using wipes. Educated on positioning of pillows.     Vision     Perception     Praxis      Pertinent Vitals/Pain Pain Assessment: 0-10 Pain Score: 5  Pain Location: back Pain Descriptors / Indicators: Throbbing Pain Intervention(s): Repositioned;Monitored during session;Ice applied;Premedicated before session     Hand Dominance Right   Extremity/Trunk Assessment Upper Extremity Assessment Upper Extremity Assessment: Overall WFL for tasks assessed   Lower Extremity Assessment Lower Extremity Assessment: Defer to PT evaluation (numbness in LLE)       Communication Communication Communication: No difficulties   Cognition Arousal/Alertness: Awake/alert Behavior During Therapy: WFL for tasks assessed/performed Overall Cognitive Status: Within Functional Limits for tasks assessed                     General Comments       Exercises       Shoulder Instructions      Home Living Family/patient expects to be discharged to:: Private residence Living Arrangements: Spouse/significant other;Children Available Help at Discharge: Family;Available PRN/intermittently (most of the time) Type of Home: House Home Access: Stairs to enter [employer] of Steps: 6 Entrance  Stairs-Rails: Right;Left (only for part of them) Home Layout: Multi-level     Bathroom Shower/Tub: Teacher, early years/pre: Handicapped height     Home Equipment: Radiation protection practitioner Equipment: Reacher;Sock aid;Long-handled shoe horn;Long-handled sponge;Other (Comment) (toilet aide)        Prior Functioning/Environment Level of Independence: Independent             OT Diagnosis: Acute pain    OT Problem List: Pain;Impaired sensation;Decreased knowledge of precautions;Decreased knowledge of use of DME or AE;Decreased activity tolerance;Decreased range of motion   OT Treatment/Interventions: Self-care/ADL training;DME and/or AE instruction;Therapeutic activities;Patient/family education;Balance training    OT Goals(Current goals can be found in the care plan section) Acute Rehab OT Goals Patient Stated Goal: stand up/get up OT Goal Formulation: With patient Time For Goal Achievement: 10/01/14 Potential to Achieve Goals: Good ADL Goals Pt Will Perform Lower Body Dressing: with set-up;with adaptive equipment;sit to/from stand Pt Will Transfer to Toilet: ambulating;with modified independence (elevated toilet) Pt Will Perform Toileting - Clothing Manipulation and hygiene: sit to/from stand;with adaptive equipment;with set-up Pt Will Perform Tub/Shower Transfer: Tub transfer;with supervision;ambulating;rolling walker;3 in 1 Additional ADL Goal #1: Pt will independently verbalize 3/3 back precautions and maintain during session.  OT Frequency: Min 2X/week   Barriers to D/C:            Co-evaluation              End of Session Equipment Utilized During Treatment: Back brace;Gait belt Nurse Communication: Other (comment) (dizzy; pain level)  Activity Tolerance: Other (comment) (dizziness) Patient left: in bed;with call bell/phone within reach;with family/visitor present   Time: 5027-7412 OT Time Calculation (min): 21 min Charges:  OT General Charges $OT Visit: 1 Procedure OT Evaluation $Initial OT Evaluation Tier I: 1 Procedure G-CodesBenito Mccreedy OTR/L [id number] 09/24/2014, 3:28 PM

## 2014-09-24 NOTE — Anesthesia Procedure Notes (Signed)
Procedure Name: Intubation Date/Time: 09/24/2014 7:37 AM Performed by: Rogers Blocker Pre-anesthesia Checklist: Patient identified, Timeout performed, Emergency Drugs available, Suction available and Patient being monitored Patient Re-evaluated:Patient Re-evaluated prior to inductionOxygen Delivery Method: Circle system utilized Preoxygenation: Pre-oxygenation with 100% oxygen Intubation Type: IV induction Ventilation: Mask ventilation without difficulty Laryngoscope Size: Miller and 3 Grade View: Grade I Tube type: Oral Tube size: 7.5 mm Number of attempts: 1 Airway Equipment and Method: Stylet Placement Confirmation: ETT inserted through vocal cords under direct vision,  breath sounds checked- equal and bilateral,  positive ETCO2 and CO2 detector Secured at: 21 cm Tube secured with: Tape Dental Injury: Teeth and Oropharynx as per pre-operative assessment

## 2014-09-24 NOTE — Anesthesia Postprocedure Evaluation (Signed)
  Anesthesia Post-op Note  Patient: Sherri Sullivan  Procedure(s) Performed: Procedure(s) with comments: L5-S1 MAS PLIF  (N/A) - L5-S1 MAS PLIF fusion  Patient Location: PACU  Anesthesia Type:General  Level of Consciousness: awake, alert  and oriented  Airway and Oxygen Therapy: Patient Spontanous Breathing and Patient connected to nasal cannula oxygen  Post-op Pain: mild  Post-op Assessment: Post-op Vital signs reviewed, Patient's Cardiovascular Status Stable, Respiratory Function Stable, Patent Airway and No signs of Nausea or vomiting              Post-op Vital Signs: Reviewed and stable  Last Vitals:  Filed Vitals:   09/24/14 0622  BP: 127/74  Pulse: 73  Temp: 36.9 C  Resp: 20    Complications: No apparent anesthesia complications

## 2014-09-25 ENCOUNTER — Encounter (HOSPITAL_COMMUNITY): Payer: Self-pay | Admitting: Neurosurgery

## 2014-09-25 MED ORDER — OXYCODONE-ACETAMINOPHEN 5-325 MG PO TABS: 1.0000 | ORAL_TABLET | ORAL | Status: DC | PRN

## 2014-09-25 MED ORDER — METHOCARBAMOL 500 MG PO TABS: 500.0000 mg | ORAL_TABLET | Freq: Four times a day (QID) | ORAL | Status: DC | PRN

## 2014-09-25 NOTE — Evaluation (Signed)
Physical Therapy Evaluation Patient Details Name: Sherri Sullivan MRN: [id number] DOB: 1960/07/06 Today's Date: 09/25/2014   History of Present Illness  54 y.o. L5-S1 MAS PLIF fusion with autograft, PEEK interbody cages, pedicle screw fixation, posterolateral arthrodesis with autograft.  Clinical Impression  Pt admitted with above diagnosis. Pt currently with functional limitations due to the deficits listed below (see PT Problem List). At the time of PT eval pt was able to perform transfers and ambulation with min guard to supervision for safety. Pt negotiated 6 stairs with min assist for balance and support. Pt will benefit from skilled PT to increase their independence and safety with mobility to allow discharge to the venue listed below.       Follow Up Recommendations Outpatient PT;Supervision for mobility/OOB (When appropriate per post-op protocol)    Equipment Recommendations  3in1 (PT)    Recommendations for Other Services       Precautions / Restrictions Precautions Precautions: Back;Fall Precaution Comments: Pt was able to recall 3/3 back precautions. Required Braces or Orthoses: Spinal Brace Spinal Brace: Lumbar corset;Applied in sitting position Restrictions Weight Bearing Restrictions: No      Mobility  Bed Mobility Overal bed mobility: Needs Assistance Bed Mobility: Sidelying to Sit   Sidelying to sit: Supervision       General bed mobility comments: Verbal and tactile cues for logroll and to maintain back precautions.  Transfers Overall transfer level: Needs assistance Equipment used: None Transfers: Sit to/from Stand Sit to Stand: Min guard         General transfer comment: Pt demonstrated proper hand placement and safety awareness with sit<>stand. Increased time required as pt moving generally slow.  Ambulation/Gait Ambulation/Gait assistance: Min guard Ambulation Distance (Feet): 125 Feet Assistive device: None Gait Pattern/deviations:  Step-through pattern;Decreased stride length;Trendelenburg Gait velocity: Decreased Gait velocity interpretation: Below normal speed for age/gender General Gait Details: Pt moving generally slow. Was able to ambulate fairly well, however holding to railings in the hall initially however was able to walk without UE support when cued.   Stairs Stairs: Yes Stairs assistance: Min assist Stair Management: One rail Right;Forwards Number of Stairs: 6 General stair comments: Therapist was holding LUE for support. Advised husband to be slightly below and to the side to assist and prevent any falls.   Wheelchair Mobility    Modified Rankin (Stroke Patients Only)       Balance Overall balance assessment: Needs assistance Sitting-balance support: Feet supported;No upper extremity supported Sitting balance-Leahy Scale: Fair     Standing balance support: No upper extremity supported;During functional activity Standing balance-Leahy Scale: Fair                               Pertinent Vitals/Pain Pain Assessment: 0-10 Pain Score: 5  Pain Location: Back Pain Descriptors / Indicators: Aching;Operative site guarding Pain Intervention(s): Limited activity within patient's tolerance;Monitored during session;Repositioned    Home Living Family/patient expects to be discharged to:: Private residence Living Arrangements: Spouse/significant other;Children Available Help at Discharge: Family;Available PRN/intermittently (most of the time) Type of Home: House Home Access: Stairs to enter Entrance Stairs-Rails: Right;Left (No rails first 3, Bilat rails last 3 but far apart) Entrance Stairs-Number of Steps: 6 Home Layout: Multi-level Home Equipment: Adaptive equipment      Prior Function Level of Independence: Independent               Hand Dominance   Dominant Hand: Right    Extremity/Trunk  Assessment   Upper Extremity Assessment: Defer to OT evaluation            Lower Extremity Assessment: LLE deficits/detail   LLE Deficits / Details: Pt reports decreased strength consistent with pre-surgical nerve compression  Cervical / Trunk Assessment: Normal  Communication   Communication: No difficulties  Cognition Arousal/Alertness: Awake/alert Behavior During Therapy: WFL for tasks assessed/performed Overall Cognitive Status: Within Functional Limits for tasks assessed                      General Comments      Exercises        Assessment/Plan    PT Assessment Patient needs continued PT services  PT Diagnosis Difficulty walking;Acute pain   PT Problem List Decreased strength;Decreased range of motion;Decreased activity tolerance;Decreased balance;Decreased mobility;Decreased knowledge of use of DME;Decreased safety awareness;Decreased knowledge of precautions;Pain  PT Treatment Interventions Gait training;DME instruction;Stair training;Functional mobility training;Therapeutic activities;Therapeutic exercise;Neuromuscular re-education;Patient/family education   PT Goals (Current goals can be found in the Care Plan section) Acute Rehab PT Goals Patient Stated Goal: Home today PT Goal Formulation: With patient/family Time For Goal Achievement: 10/09/14 Potential to Achieve Goals: Good    Frequency Min 5X/week   Barriers to discharge        Co-evaluation               End of Session Equipment Utilized During Treatment: Back brace Activity Tolerance: Patient tolerated treatment well Patient left: in chair;with call bell/phone within reach;with family/visitor present Nurse Communication: Mobility status         Time: 0730-0758 PT Time Calculation (min) (ACUTE ONLY): 28 min   Charges:   PT Evaluation $Initial PT Evaluation Tier I: 1 Procedure PT Treatments $Gait Training: 8-22 mins   PT G Codes:        Rolinda Roan 2014-10-14, 8:11 AM   Rolinda Roan, PT, DPT Acute Rehabilitation Services Pager:  [phone]

## 2014-09-25 NOTE — Progress Notes (Signed)
Occupational Therapy Treatment Patient Details Name: Sherri Sullivan MRN: [id number] DOB: 05/11/1960 Today's Date: 09/25/2014    History of present illness 54 y.o. L5-S1 MAS PLIF fusion with autograft, PEEK interbody cages, pedicle screw fixation, posterolateral arthrodesis with autograft.   OT comments  Pt progressing and feel she is safe to d/c home from OT standpoint. Pt planning to d/c today.  Follow Up Recommendations  No OT follow up;Supervision - Intermittent    Equipment Recommendations  3 in 1 bedside comode    Recommendations for Other Services      Precautions / Restrictions Precautions Precautions: Back;Fall Precaution Comments: Pt was able to recall 3/3 back precautions. Required Braces or Orthoses: Spinal Brace Spinal Brace: Lumbar corset;Applied in sitting position (adjusted in standing) Restrictions Weight Bearing Restrictions: No       Mobility Bed Mobility  General bed mobility comments: not assessed  Transfers Overall transfer level: Needs assistance Equipment used: None Transfers: Sit to/from Stand Sit to Stand: Min guard          Balance Min guard-supervision for ambulation.              ADL Overall ADL's : Needs assistance/impaired                 Upper Body Dressing : Set up;Supervision/safety;Sitting;Standing   Lower Body Dressing: Sit to/from stand;With adaptive equipment;Minimal assistance Lower Body Dressing Details (indicate cue type and reason): assisted minimally with reacher as it was first time practicing Toilet Transfer: Min guard;Ambulation;Supervision/safety (chair)           Functional mobility during ADLs: Min guard;Supervision/safety General ADL Comments: Educated on safety such as safe footwear, rugs/items on floor and recommended spouse be with her for tub transfer. Educated on tub transfer technique and discussed 3 in 1. Educated on AE and pt practiced with reacher and sockaid. Reviewed/educated on back  brace. Suggested using wipes for toilet hygiene.      Vision                     Perception     Praxis      Cognition  Awake/Alert Behavior During Therapy: WFL for tasks assessed/performed Overall Cognitive Status: Within Functional Limits for tasks assessed                         Exercises     Shoulder Instructions       General Comments      Pertinent Vitals/ Pain       Pain Assessment: 0-10 Pain Score: 5  Pain Location: back Pain Descriptors / Indicators: Other (Comment) (stiffness) Pain Intervention(s): Monitored during session;Repositioned  Home Living Family/patient expects to be discharged to:: Private residence Living Arrangements: Spouse/significant other;Children Available Help at Discharge: Family;Available PRN/intermittently (most of the time) Type of Home: House Home Access: Stairs to enter [employer] of Steps: 6 Entrance Stairs-Rails: Right;Left (No rails first 3, Bilat rails last 3 but far apart) Home Layout: Multi-level Alternate Level Stairs-Number of Steps: Flight up to bedroom             Home Equipment: Adaptive equipment Adaptive Equipment: Reacher;Sock aid;Long-handled shoe horn;Long-handled sponge;Other (Comment) (toilet aide)        Prior Functioning/Environment Level of Independence: Independent            Frequency Min 2X/week     Progress Toward Goals  OT Goals(current goals can now be found in the care plan section)  Progress  towards OT goals: Progressing toward goals  Acute Rehab OT Goals Patient Stated Goal: go home OT Goal Formulation: With patient Time For Goal Achievement: 10/01/14 Potential to Achieve Goals: Good ADL Goals Pt Will Perform Lower Body Dressing: with set-up;with adaptive equipment;sit to/from stand Pt Will Transfer to Toilet: ambulating;with modified independence (elevated toilet) Pt Will Perform Toileting - Clothing Manipulation and hygiene: sit to/from stand;with  adaptive equipment;with set-up Pt Will Perform Tub/Shower Transfer: Tub transfer;with supervision;ambulating;rolling walker;3 in 1 Additional ADL Goal #1: Pt will independently verbalize 3/3 back precautions and maintain during session.  Plan Discharge plan remains appropriate    Co-evaluation                 End of Session Equipment Utilized During Treatment: Back brace;Other (comment) (AE)   Activity Tolerance Patient tolerated treatment well   Patient Left in chair;with call bell/phone within reach;with family/visitor present   Nurse Communication Other (comment) (pain level)        Time: 4665-9935 OT Time Calculation (min): 17 min  Charges: OT General Charges $OT Visit: 1 Procedure OT Treatments $Self Care/Home Management : 8-22 mins  Benito Mccreedy OTR/L [phone] 09/25/2014, 10:41 AM

## 2014-09-25 NOTE — Discharge Instructions (Signed)
Wound Care Leave incision open to air. You may shower. Do not scrub directly on incision.  Do not put any creams, lotions, or ointments on incision. Activity Walk each and every day, increasing distance each day. No lifting greater than 5 lbs.  Avoid bending, arching, and twisting. No driving for 2 weeks; may ride as a passenger locally. If provided with back brace, wear when out of bed.  It is not necessary to wear in bed. Diet Resume your normal diet.  Return to Work Will be discussed at you follow up appointment. Call Your Doctor If Any of These Occur Redness, drainage, or swelling at the wound.  Temperature greater than 101 degrees. Severe pain not relieved by pain medication. Incision starts to come apart. Follow Up Appt Call today for appointment in 3-4 weeks ([phone]) or for problems.  If you have any hardware placed in your spine, you will need an x-ray before your appointment.   Spinal Fusion Spinal fusion is a procedure to make 2 or more of the bones in your spinal column (vertebrae) grow together (fuse). This procedure stops movement between the vertebrae and can relieve pain and prevent deformity.  Spinal fusion is used to treat the following conditions:  Fractures of the spine.  Herniated disk (the spongy material [cartilage] between the vertebrae).  Abnormal curvatures of the spine, such as scoliosis or kyphosis.  A weak or an unstable spine, caused by infections or tumor. RISKS AND COMPLICATIONS Complications associated with spinal fusion are rare, but they can occur. Possible complications include:  Bleeding.  Infection near the incision.  Nerve damage. Signs of nerve damage are back pain, pain in one or both legs, weakness, or numbness.  Spinal fluid leakage.  Blood clot in your leg, which can move to your lungs.  Difficulty controlling urination or bowel movements. BEFORE THE PROCEDURE  A medical evaluation will be done. This will include a  physical exam, blood tests, and imaging exams.  You will talk with an anesthesiologist. This is the person who will be in charge of the anesthesia during the procedure. Spinal fusion usually requires that you are asleep during the procedure (general anesthesia).  You will need to stop taking certain medicines, particularly those associated with an increased risk of bleeding. Ask your caregiver about changing or stopping your regular medicines.  If you smoke, you will need to stop at least 2 weeks before the procedure. Smoking can slow down the healing process, especially fusion of the vertebrae, and increase the risk of complications.  Do not eat or drink anything for at least 8 hours before the procedure. PROCEDURE  A cut (incision) is made over the vertebrae that will be fused. The back muscles are separated from the vertebrae. If you are having this procedure to treat a herniated disk, the disc material pressing on the nerve root is removed (decompression). The area where the disk is removed is then filled with extra bone. Bone from another part of your body (autogenous bone) or bone from a bone donor (allograft bone) may be used. The extra bone promotes fusion between the vertebrae. Sometimes, specific medicines are added to the fusion area to promote bone healing. In most cases, screws and rods or metal plates will be used to attach the vertebrae to stabilize them while they fuse.  AFTER THE PROCEDURE   You will stay in a recovery area until the anesthesia has worn off. Your blood pressure and pulse will be checked frequently.  You will be  given antibiotics to prevent infection.  You may continue to receive fluids through an intravenous (IV) tube while you are still in the hospital.  Pain after surgery is normal. You will be given pain medicine.  You will be taught how to move correctly and how to stand and walk. While in bed, you will be instructed to turn frequently, using a "log rolling"  technique, in which the entire body is moved without twisting the back. Document Released: 12/12/2002 Document Revised: 06/07/2011 Document Reviewed: 05/28/2010 [facility] Patient Information 2015 [place], [place]. This information is not intended to replace advice given to you by your health care provider. Make sure you discuss any questions you have with your health care provider.

## 2014-09-25 NOTE — Progress Notes (Signed)
Subjective: Patient reports doing well  Objective: Vital signs in last 24 hours: Temp:  [97.8 F (36.6 C)-98.7 F (37.1 C)] 98.4 F (36.9 C) (06/29 0400) Pulse Rate:  [78-89] 82 (06/29 0400) Resp:  [18-31] 18 (06/29 0400) BP: (103-133)/(56-87) 113/56 mmHg (06/29 0400) SpO2:  [95 %-100 %] 100 % (06/29 0400)  Intake/Output from previous day: 06/28 0701 - 06/29 0700 In: 2800 [P.O.:200; I.V.:2600] Out: 2900 [Urine:2750; Blood:150] Intake/Output this shift:    Physical Exam: Full strength, dressing CDI.  Lab Results: No results for input(s): WBC, HGB, HCT, PLT in the last 72 hours. BMET No results for input(s): NA, K, CL, CO2, GLUCOSE, BUN, CREATININE, CALCIUM in the last 72 hours.  Studies/Results: Dg Lumbar Spine 2-3 Views  09/24/2014   CLINICAL DATA:  L5-S1 MAS PLIF  EXAM: DG C-ARM 61-120 MIN; LUMBAR SPINE - 2-3 VIEW  FLUOROSCOPY TIME:  Fluoroscopy Time (in minutes and seconds): 1 minutes, 6 seconds  Number of Acquired Images:  2  COMPARISON:  Lumbar MRI of May 29, 2014  FINDINGS: The patient is undergoing L5-S1 posterior fusion and intradiscal device placement. Pedicle screws have been placed at L5 and at S1 and the intradiscal device is visible. Connecting rods between the pedicle screws have not yet been placed.  IMPRESSION: The patient is in the process of undergoing posterior fusion and intradiscal device placement at the L5-S1 level.   Electronically Signed   By: David  Martinique M.D.   On: 09/24/2014 11:34   Dg C-arm 1-60 Min  09/24/2014   CLINICAL DATA:  L5-S1 MAS PLIF  EXAM: DG C-ARM 61-120 MIN; LUMBAR SPINE - 2-3 VIEW  FLUOROSCOPY TIME:  Fluoroscopy Time (in minutes and seconds): 1 minutes, 6 seconds  Number of Acquired Images:  2  COMPARISON:  Lumbar MRI of May 29, 2014  FINDINGS: The patient is undergoing L5-S1 posterior fusion and intradiscal device placement. Pedicle screws have been placed at L5 and at S1 and the intradiscal device is visible. Connecting rods between the  pedicle screws have not yet been placed.  IMPRESSION: The patient is in the process of undergoing posterior fusion and intradiscal device placement at the L5-S1 level.   Electronically Signed   By: David  Martinique M.D.   On: 09/24/2014 11:34    Assessment/Plan: Doing well.  Discharge home.    LOS: 1 day    Peggyann Shoals, MD 09/25/2014, 8:12 AM

## 2014-09-25 NOTE — Discharge Summary (Signed)
Physician Discharge Summary  Patient ID: Sherri Sullivan MRN: [id number] DOB/AGE: 08/02/1960 54 y.o.  Admit date: 09/24/2014 Discharge date: 09/25/2014  Admission Diagnoses:Lumbar spondylolisthesis, stenosis, radiculopathy L 5 S 1  Discharge Diagnoses: Lumbar spondylolisthesis, stenosis, radiculopathy L 5 S 1 Active Problems:   Spondylolisthesis of lumbar region   Discharged Condition: good  Hospital Course: Patient underwent decompression and fusion L 5 S 1 level and did well and was discharged home on POD 1  Consults: None  Significant Diagnostic Studies: None  Treatments: surgery: decompression and fusion L 5 S 1 level  Discharge Exam: Blood pressure 113/56, pulse 82, temperature 98.4 F (36.9 C), temperature source Oral, resp. rate 18, weight 97.523 kg (215 lb), SpO2 100 %. Neurologic: Alert and oriented X 3, normal strength and tone. Normal symmetric reflexes. Normal coordination and gait Wound:CDi  Disposition: Home     Medication List    STOP taking these medications        celecoxib 200 MG capsule  Commonly known as:  CELEBREX      TAKE these medications        cetirizine 10 MG tablet  Commonly known as:  ZYRTEC  Take 10 mg by mouth daily.     chlorthalidone 25 MG tablet  Commonly known as:  HYGROTON  TAKE 1 TABLET (25 MG) BY MOUTH DAILY.     cyclobenzaprine 5 MG tablet  Commonly known as:  FLEXERIL  Take 1 tablet (5 mg total) by mouth 3 (three) times daily as needed. For muscle pain     estrogens (conjugated) 0.625 MG tablet  Commonly known as:  PREMARIN  Take 0.625 mg by mouth daily. Take daily for 21 days then do not take for 7 days.     methocarbamol 500 MG tablet  Commonly known as:  ROBAXIN  Take 1 tablet (500 mg total) by mouth every 6 (six) hours as needed for muscle spasms.     multivitamin with minerals tablet  Take 1 tablet by mouth 3 (three) times a week.     naproxen sodium 220 MG tablet  Commonly known as:  ANAPROX  Take 440 mg by  mouth daily as needed (pain). ALEVE     OMEGA-3 KRILL OIL PO  Take 353 mg by mouth daily. [facility]     oxyCODONE-acetaminophen 5-325 MG per tablet  Commonly known as:  PERCOCET/ROXICET  Take 1-2 tablets by mouth every 4 (four) hours as needed for moderate pain or severe pain.     potassium chloride SA 20 MEQ tablet  Commonly known as:  K-DUR,KLOR-CON  TAKE 1 TABLET (20 MEQ TOTAL) BY MOUTH 2 (TWO) TIMES DAILY.     PROBIOTIC PO  Take 2 tablets by mouth at bedtime.     traMADol 50 MG tablet  Commonly known as:  ULTRAM  1-2 tabs by mouth Q8 hours, maximum 6 tabs per day.         Signed: Peggyann Shoals, MD 09/25/2014, 8:14 AM

## 2014-09-25 NOTE — Progress Notes (Signed)
Pt doing well. Pt and husband given D/C instructions with Rx's, verbal understanding was provided. Pt's incision is clean and dry. Pt's IV was removed prior to D/C. Pt D/C'd home via wheelchair @ 1000 per MD order. Pt is stable @ D/C and has no other needs at this time. Holli Humbles, RN

## 2014-10-01 ENCOUNTER — Encounter: Payer: 59 | Admitting: Family Medicine

## 2015-01-07 ENCOUNTER — Telehealth: Payer: Self-pay

## 2015-01-07 MED ORDER — CHLORTHALIDONE 25 MG PO TABS: ORAL_TABLET | ORAL | Status: DC

## 2015-01-07 NOTE — Telephone Encounter (Signed)
Refill done.  

## 2015-01-07 NOTE — Telephone Encounter (Signed)
Patients need refill for chlorthalidone 25 mgm per pharmacy requested refill last week and wanted to send another message

## 2015-01-31 ENCOUNTER — Encounter: Payer: Self-pay | Admitting: Family Medicine

## 2015-01-31 ENCOUNTER — Ambulatory Visit (INDEPENDENT_AMBULATORY_CARE_PROVIDER_SITE_OTHER): Payer: 59 | Admitting: Family Medicine

## 2015-01-31 VITALS — BP 118/82 | HR 75 | Temp 98.3°F | Ht 63.0 in | Wt 214.0 lb

## 2015-01-31 DIAGNOSIS — I10 Essential (primary) hypertension: Secondary | ICD-10-CM | POA: Diagnosis not present

## 2015-01-31 DIAGNOSIS — M5441 Lumbago with sciatica, right side: Secondary | ICD-10-CM | POA: Diagnosis not present

## 2015-01-31 DIAGNOSIS — E785 Hyperlipidemia, unspecified: Secondary | ICD-10-CM

## 2015-01-31 DIAGNOSIS — E876 Hypokalemia: Secondary | ICD-10-CM

## 2015-01-31 DIAGNOSIS — I471 Supraventricular tachycardia: Secondary | ICD-10-CM

## 2015-01-31 DIAGNOSIS — M545 Low back pain: Secondary | ICD-10-CM

## 2015-01-31 DIAGNOSIS — E669 Obesity, unspecified: Secondary | ICD-10-CM

## 2015-01-31 DIAGNOSIS — Z0000 Encounter for general adult medical examination without abnormal findings: Secondary | ICD-10-CM

## 2015-01-31 DIAGNOSIS — R6889 Other general symptoms and signs: Secondary | ICD-10-CM

## 2015-01-31 DIAGNOSIS — Z0001 Encounter for general adult medical examination with abnormal findings: Secondary | ICD-10-CM

## 2015-01-31 DIAGNOSIS — Z1239 Encounter for other screening for malignant neoplasm of breast: Secondary | ICD-10-CM

## 2015-01-31 LAB — COMPREHENSIVE METABOLIC PANEL
ALT: 11 U/L (ref 6–29)
AST: 15 U/L (ref 10–35)
Albumin: 4 g/dL (ref 3.6–5.1)
Alkaline Phosphatase: 62 U/L (ref 33–130)
BUN: 12 mg/dL (ref 7–25)
CHLORIDE: 102 mmol/L (ref 98–110)
CO2: 26 mmol/L (ref 20–31)
Calcium: 9.3 mg/dL (ref 8.6–10.4)
Creat: 0.65 mg/dL (ref 0.50–1.05)
GLUCOSE: 85 mg/dL (ref 65–99)
POTASSIUM: 3.2 mmol/L — AB (ref 3.5–5.3)
Sodium: 138 mmol/L (ref 135–146)
Total Bilirubin: 0.9 mg/dL (ref 0.2–1.2)
Total Protein: 7.4 g/dL (ref 6.1–8.1)

## 2015-01-31 LAB — CBC
HCT: 41.7 % (ref 36.0–46.0)
Hemoglobin: 13.6 g/dL (ref 12.0–15.0)
MCH: 27.8 pg (ref 26.0–34.0)
MCHC: 32.6 g/dL (ref 30.0–36.0)
MCV: 85.1 fL (ref 78.0–100.0)
MPV: 12.1 fL (ref 8.6–12.4)
PLATELETS: 298 10*3/uL (ref 150–400)
RBC: 4.9 MIL/uL (ref 3.87–5.11)
RDW: 15.7 % — ABNORMAL HIGH (ref 11.5–15.5)
WBC: 6.8 10*3/uL (ref 4.0–10.5)

## 2015-01-31 LAB — TSH: TSH: 0.636 u[IU]/mL (ref 0.350–4.500)

## 2015-01-31 LAB — LIPID PANEL
CHOL/HDL RATIO: 3.3 ratio (ref <=5.0)
CHOLESTEROL: 245 mg/dL — AB (ref 125–200)
HDL: 75 mg/dL (ref >=46)
LDL Cholesterol: 146 mg/dL — ABNORMAL HIGH (ref <130)
Triglycerides: 118 mg/dL (ref <150)
VLDL: 24 mg/dL (ref <30)

## 2015-01-31 MED ORDER — METHYLPREDNISOLONE 4 MG PO TABS: ORAL_TABLET | ORAL | Status: DC

## 2015-01-31 NOTE — Assessment & Plan Note (Signed)
Well controlled, no changes to meds. Encouraged heart healthy diet such as the DASH diet and exercise as tolerated.  °

## 2015-01-31 NOTE — Assessment & Plan Note (Signed)
Encouraged DASH diet, decrease po intake and increase exercise as tolerated. Needs 7-8 hours of sleep nightly. Avoid trans fats, eat small, frequent meals every 4-5 hours with lean proteins, complex carbs and healthy fats. Minimize simple carbs, GMO foods. 

## 2015-01-31 NOTE — Progress Notes (Signed)
Pre visit review using our clinic review tool, if applicable. No additional management support is needed unless otherwise documented below in the visit note. 

## 2015-01-31 NOTE — Patient Instructions (Signed)
Call insurance and confirm they will cover Hep C and HIV screens as recommended by [facility]  Preventive Care for Adults, Female A healthy lifestyle and preventive care can promote health and wellness. Preventive health guidelines for women include the following key practices.  A routine yearly physical is a good way to check with your health care provider about your health and preventive screening. It is a chance to share any concerns and updates on your health and to receive a thorough exam.  Visit your dentist for a routine exam and preventive care every 6 months. Brush your teeth twice a day and floss once a day. Good oral hygiene prevents tooth decay and gum disease.  The frequency of eye exams is based on your age, health, family medical history, use of contact lenses, and other factors. Follow your health care provider's recommendations for frequency of eye exams.  Eat a healthy diet. Foods like vegetables, fruits, whole grains, low-fat dairy products, and lean protein foods contain the nutrients you need without too many calories. Decrease your intake of foods high in solid fats, added sugars, and salt. Eat the right amount of calories for you.Get information about a proper diet from your health care provider, if necessary.  Regular physical exercise is one of the most important things you can do for your health. Most adults should get at least 150 minutes of moderate-intensity exercise (any activity that increases your heart rate and causes you to sweat) each week. In addition, most adults need muscle-strengthening exercises on 2 or more days a week.  Maintain a healthy weight. The body mass index (BMI) is a screening tool to identify possible weight problems. It provides an estimate of body fat based on height and weight. Your health care provider can find your BMI and can help you achieve or maintain a healthy weight.For adults 20 years and older:  A BMI below 18.5 is considered  underweight.  A BMI of 18.5 to 24.9 is normal.  A BMI of 25 to 29.9 is considered overweight.  A BMI of 30 and above is considered obese.  Maintain normal blood lipids and cholesterol levels by exercising and minimizing your intake of saturated fat. Eat a balanced diet with plenty of fruit and vegetables. Blood tests for lipids and cholesterol should begin at age 42 and be repeated every 5 years. If your lipid or cholesterol levels are high, you are over 50, or you are at high risk for heart disease, you may need your cholesterol levels checked more frequently.Ongoing high lipid and cholesterol levels should be treated with medicines if diet and exercise are not working.  If you smoke, find out from your health care provider how to quit. If you do not use tobacco, do not start.  Lung cancer screening is recommended for adults aged 27-80 years who are at high risk for developing lung cancer because of a history of smoking. A yearly low-dose CT scan of the lungs is recommended for people who have at least a 30-pack-year history of smoking and are a current smoker or have quit within the past 15 years. A pack year of smoking is smoking an average of 1 pack of cigarettes a day for 1 year (for example: 1 pack a day for 30 years or 2 packs a day for 15 years). Yearly screening should continue until the smoker has stopped smoking for at least 15 years. Yearly screening should be stopped for people who develop a health problem that would prevent them  from having lung cancer treatment.  If you are pregnant, do not drink alcohol. If you are breastfeeding, be very cautious about drinking alcohol. If you are not pregnant and choose to drink alcohol, do not have more than 1 drink per day. One drink is considered to be 12 ounces (355 mL) of beer, 5 ounces (148 mL) of wine, or 1.5 ounces (44 mL) of liquor.  Avoid use of street drugs. Do not share needles with anyone. Ask for help if you need support or  instructions about stopping the use of drugs.  High blood pressure causes heart disease and increases the risk of stroke. Your blood pressure should be checked at least every 1 to 2 years. Ongoing high blood pressure should be treated with medicines if weight loss and exercise do not work.  If you are 55-79 years old, ask your health care provider if you should take aspirin to prevent strokes.  Diabetes screening is done by taking a blood sample to check your blood glucose level after you have not eaten for a certain period of time (fasting). If you are not overweight and you do not have risk factors for diabetes, you should be screened once every 3 years starting at age 45. If you are overweight or obese and you are 40-70 years of age, you should be screened for diabetes every year as part of your cardiovascular risk assessment.  Breast cancer screening is essential preventive care for women. You should practice "breast self-awareness." This means understanding the normal appearance and feel of your breasts and may include breast self-examination. Any changes detected, no matter how small, should be reported to a health care provider. Women in their 20s and 30s should have a clinical breast exam (CBE) by a health care provider as part of a regular health exam every 1 to 3 years. After age 40, women should have a CBE every year. Starting at age 40, women should consider having a mammogram (breast X-ray test) every year. Women who have a family history of breast cancer should talk to their health care provider about genetic screening. Women at a high risk of breast cancer should talk to their health care providers about having an MRI and a mammogram every year.  Breast cancer gene (BRCA)-related cancer risk assessment is recommended for women who have family members with BRCA-related cancers. BRCA-related cancers include breast, ovarian, tubal, and peritoneal cancers. Having family members with these  cancers may be associated with an increased risk for harmful changes (mutations) in the breast cancer genes BRCA1 and BRCA2. Results of the assessment will determine the need for genetic counseling and BRCA1 and BRCA2 testing.  Your health care provider may recommend that you be screened regularly for cancer of the pelvic organs (ovaries, uterus, and vagina). This screening involves a pelvic examination, including checking for microscopic changes to the surface of your cervix (Pap test). You may be encouraged to have this screening done every 3 years, beginning at age 21.  For women ages 30-65, health care providers may recommend pelvic exams and Pap testing every 3 years, or they may recommend the Pap and pelvic exam, combined with testing for human papilloma virus (HPV), every 5 years. Some types of HPV increase your risk of cervical cancer. Testing for HPV may also be done on women of any age with unclear Pap test results.  Other health care providers may not recommend any screening for nonpregnant women who are considered low risk for pelvic cancer and who   do not have symptoms. Ask your health care provider if a screening pelvic exam is right for you.  If you have had past treatment for cervical cancer or a condition that could lead to cancer, you need Pap tests and screening for cancer for at least 20 years after your treatment. If Pap tests have been discontinued, your risk factors (such as having a new sexual partner) need to be reassessed to determine if screening should resume. Some women have medical problems that increase the chance of getting cervical cancer. In these cases, your health care provider may recommend more frequent screening and Pap tests.  Colorectal cancer can be detected and often prevented. Most routine colorectal cancer screening begins at the age of 50 years and continues through age 75 years. However, your health care provider may recommend screening at an earlier age if you  have risk factors for colon cancer. On a yearly basis, your health care provider may provide home test kits to check for hidden blood in the stool. Use of a small camera at the end of a tube, to directly examine the colon (sigmoidoscopy or colonoscopy), can detect the earliest forms of colorectal cancer. Talk to your health care provider about this at age 50, when routine screening begins. Direct exam of the colon should be repeated every 5-10 years through age 75 years, unless early forms of precancerous polyps or small growths are found.  People who are at an increased risk for hepatitis B should be screened for this virus. You are considered at high risk for hepatitis B if:  You were born in a country where hepatitis B occurs often. Talk with your health care provider about which countries are considered high risk.  Your parents were born in a high-risk country and you have not received a shot to protect against hepatitis B (hepatitis B vaccine).  You have HIV or AIDS.  You use needles to inject street drugs.  You live with, or have sex with, someone who has hepatitis B.  You get hemodialysis treatment.  You take certain medicines for conditions like cancer, organ transplantation, and autoimmune conditions.  Hepatitis C blood testing is recommended for all people born from 1945 through 1965 and any individual with known risks for hepatitis C.  Practice safe sex. Use condoms and avoid high-risk sexual practices to reduce the spread of sexually transmitted infections (STIs). STIs include gonorrhea, chlamydia, syphilis, trichomonas, herpes, HPV, and human immunodeficiency virus (HIV). Herpes, HIV, and HPV are viral illnesses that have no cure. They can result in disability, cancer, and death.  You should be screened for sexually transmitted illnesses (STIs) including gonorrhea and chlamydia if:  You are sexually active and are younger than 24 years.  You are older than 24 years and your  health care provider tells you that you are at risk for this type of infection.  Your sexual activity has changed since you were last screened and you are at an increased risk for chlamydia or gonorrhea. Ask your health care provider if you are at risk.  If you are at risk of being infected with HIV, it is recommended that you take a prescription medicine daily to prevent HIV infection. This is called preexposure prophylaxis (PrEP). You are considered at risk if:  You are sexually active and do not regularly use condoms or know the HIV status of your partner(s).  You take drugs by injection.  You are sexually active with a partner who has HIV.  Talk with   your health care provider about whether you are at high risk of being infected with HIV. If you choose to begin PrEP, you should first be tested for HIV. You should then be tested every 3 months for as long as you are taking PrEP.  Osteoporosis is a disease in which the bones lose minerals and strength with aging. This can result in serious bone fractures or breaks. The risk of osteoporosis can be identified using a bone density scan. Women ages 87 years and over and women at risk for fractures or osteoporosis should discuss screening with their health care providers. Ask your health care provider whether you should take a calcium supplement or vitamin D to reduce the rate of osteoporosis.  Menopause can be associated with physical symptoms and risks. Hormone replacement therapy is available to decrease symptoms and risks. You should talk to your health care provider about whether hormone replacement therapy is right for you.  Use sunscreen. Apply sunscreen liberally and repeatedly throughout the day. You should seek shade when your shadow is shorter than you. Protect yourself by wearing long sleeves, pants, a wide-brimmed hat, and sunglasses year round, whenever you are outdoors.  Once a month, do a whole body skin exam, using a mirror to look  at the skin on your back. Tell your health care provider of new moles, moles that have irregular borders, moles that are larger than a pencil eraser, or moles that have changed in shape or color.  Stay current with required vaccines (immunizations).  Influenza vaccine. All adults should be immunized every year.  Tetanus, diphtheria, and acellular pertussis (Td, Tdap) vaccine. Pregnant women should receive 1 dose of Tdap vaccine during each pregnancy. The dose should be obtained regardless of the length of time since the last dose. Immunization is preferred during the 27th-36th week of gestation. An adult who has not previously received Tdap or who does not know her vaccine status should receive 1 dose of Tdap. This initial dose should be followed by tetanus and diphtheria toxoids (Td) booster doses every 10 years. Adults with an unknown or incomplete history of completing a 3-dose immunization series with Td-containing vaccines should begin or complete a primary immunization series including a Tdap dose. Adults should receive a Td booster every 10 years.  Varicella vaccine. An adult without evidence of immunity to varicella should receive 2 doses or a second dose if she has previously received 1 dose. Pregnant females who do not have evidence of immunity should receive the first dose after pregnancy. This first dose should be obtained before leaving the health care facility. The second dose should be obtained 4-8 weeks after the first dose.  Human papillomavirus (HPV) vaccine. Females aged 13-26 years who have not received the vaccine previously should obtain the 3-dose series. The vaccine is not recommended for use in pregnant females. However, pregnancy testing is not needed before receiving a dose. If a female is found to be pregnant after receiving a dose, no treatment is needed. In that case, the remaining doses should be delayed until after the pregnancy. Immunization is recommended for any person  with an immunocompromised condition through the age of 24 years if she did not get any or all doses earlier. During the 3-dose series, the second dose should be obtained 4-8 weeks after the first dose. The third dose should be obtained 24 weeks after the first dose and 16 weeks after the second dose.  Zoster vaccine. One dose is recommended for adults aged 45  years or older unless certain conditions are present.  Measles, mumps, and rubella (MMR) vaccine. Adults born before 1957 generally are considered immune to measles and mumps. Adults born in 1957 or later should have 1 or more doses of MMR vaccine unless there is a contraindication to the vaccine or there is laboratory evidence of immunity to each of the three diseases. A routine second dose of MMR vaccine should be obtained at least 28 days after the first dose for students attending postsecondary schools, health care workers, or international travelers. People who received inactivated measles vaccine or an unknown type of measles vaccine during 1963-1967 should receive 2 doses of MMR vaccine. People who received inactivated mumps vaccine or an unknown type of mumps vaccine before 1979 and are at high risk for mumps infection should consider immunization with 2 doses of MMR vaccine. For females of childbearing age, rubella immunity should be determined. If there is no evidence of immunity, females who are not pregnant should be vaccinated. If there is no evidence of immunity, females who are pregnant should delay immunization until after pregnancy. Unvaccinated health care workers born before 1957 who lack laboratory evidence of measles, mumps, or rubella immunity or laboratory confirmation of disease should consider measles and mumps immunization with 2 doses of MMR vaccine or rubella immunization with 1 dose of MMR vaccine.  Pneumococcal 13-valent conjugate (PCV13) vaccine. When indicated, a person who is uncertain of his immunization history and has  no record of immunization should receive the PCV13 vaccine. All adults 65 years of age and older should receive this vaccine. An adult aged 19 years or older who has certain medical conditions and has not been previously immunized should receive 1 dose of PCV13 vaccine. This PCV13 should be followed with a dose of pneumococcal polysaccharide (PPSV23) vaccine. Adults who are at high risk for pneumococcal disease should obtain the PPSV23 vaccine at least 8 weeks after the dose of PCV13 vaccine. Adults older than 54 years of age who have normal immune system function should obtain the PPSV23 vaccine dose at least 1 year after the dose of PCV13 vaccine.  Pneumococcal polysaccharide (PPSV23) vaccine. When PCV13 is also indicated, PCV13 should be obtained first. All adults aged 65 years and older should be immunized. An adult younger than age 65 years who has certain medical conditions should be immunized. Any person who resides in a nursing home or long-term care facility should be immunized. An adult smoker should be immunized. People with an immunocompromised condition and certain other conditions should receive both PCV13 and PPSV23 vaccines. People with human immunodeficiency virus (HIV) infection should be immunized as soon as possible after diagnosis. Immunization during chemotherapy or radiation therapy should be avoided. Routine use of PPSV23 vaccine is not recommended for American Indians, [place] Natives, or people younger than 65 years unless there are medical conditions that require PPSV23 vaccine. When indicated, people who have unknown immunization and have no record of immunization should receive PPSV23 vaccine. One-time revaccination 5 years after the first dose of PPSV23 is recommended for people aged 19-64 years who have chronic kidney failure, nephrotic syndrome, asplenia, or immunocompromised conditions. People who received 1-2 doses of PPSV23 before age 65 years should receive another dose of PPSV23  vaccine at age 65 years or later if at least 5 years have passed since the previous dose. Doses of PPSV23 are not needed for people immunized with PPSV23 at or after age 65 years.  Meningococcal vaccine. Adults with asplenia or persistent complement   component deficiencies should receive 2 doses of quadrivalent meningococcal conjugate (MenACWY-D) vaccine. The doses should be obtained at least 2 months apart. Microbiologists working with certain meningococcal bacteria, military recruits, people at risk during an outbreak, and people who travel to or live in countries with a high rate of meningitis should be immunized. A first-year college student up through age 21 years who is living in a residence hall should receive a dose if she did not receive a dose on or after her 16th birthday. Adults who have certain high-risk conditions should receive one or more doses of vaccine.  Hepatitis A vaccine. Adults who wish to be protected from this disease, have certain high-risk conditions, work with hepatitis A-infected animals, work in hepatitis A research labs, or travel to or work in countries with a high rate of hepatitis A should be immunized. Adults who were previously unvaccinated and who anticipate close contact with an international adoptee during the first 60 days after arrival in the [place] from a country with a high rate of hepatitis A should be immunized.  Hepatitis B vaccine. Adults who wish to be protected from this disease, have certain high-risk conditions, may be exposed to blood or other infectious body fluids, are household contacts or sex partners of hepatitis B positive people, are clients or workers in certain care facilities, or travel to or work in countries with a high rate of hepatitis B should be immunized.  Haemophilus influenzae type b (Hib) vaccine. A previously unvaccinated person with asplenia or sickle cell disease or having a scheduled splenectomy should receive 1 dose of Hib  vaccine. Regardless of previous immunization, a recipient of a hematopoietic stem cell transplant should receive a 3-dose series 6-12 months after her successful transplant. Hib vaccine is not recommended for adults with HIV infection. Preventive Services / Frequency Ages 19 to 39 years  Blood pressure check.** / Every 3-5 years.  Lipid and cholesterol check.** / Every 5 years beginning at age 20.  Clinical breast exam.** / Every 3 years for women in their 20s and 30s.  BRCA-related cancer risk assessment.** / For women who have family members with a BRCA-related cancer (breast, ovarian, tubal, or peritoneal cancers).  Pap test.** / Every 2 years from ages 21 through 29. Every 3 years starting at age 30 through age 65 or 70 with a history of 3 consecutive normal Pap tests.  HPV screening.** / Every 3 years from ages 30 through ages 65 to 70 with a history of 3 consecutive normal Pap tests.  Hepatitis C blood test.** / For any individual with known risks for hepatitis C.  Skin self-exam. / Monthly.  Influenza vaccine. / Every year.  Tetanus, diphtheria, and acellular pertussis (Tdap, Td) vaccine.** / Consult your health care provider. Pregnant women should receive 1 dose of Tdap vaccine during each pregnancy. 1 dose of Td every 10 years.  Varicella vaccine.** / Consult your health care provider. Pregnant females who do not have evidence of immunity should receive the first dose after pregnancy.  HPV vaccine. / 3 doses over 6 months, if 26 and younger. The vaccine is not recommended for use in pregnant females. However, pregnancy testing is not needed before receiving a dose.  Measles, mumps, rubella (MMR) vaccine.** / You need at least 1 dose of MMR if you were born in 1957 or later. You may also need a 2nd dose. For females of childbearing age, rubella immunity should be determined. If there is no evidence of immunity, females   who are not pregnant should be vaccinated. If there is no  evidence of immunity, females who are pregnant should delay immunization until after pregnancy.  Pneumococcal 13-valent conjugate (PCV13) vaccine.** / Consult your health care provider.  Pneumococcal polysaccharide (PPSV23) vaccine.** / 1 to 2 doses if you smoke cigarettes or if you have certain conditions.  Meningococcal vaccine.** / 1 dose if you are age 19 to 21 years and a first-year college student living in a residence hall, or have one of several medical conditions, you need to get vaccinated against meningococcal disease. You may also need additional booster doses.  Hepatitis A vaccine.** / Consult your health care provider.  Hepatitis B vaccine.** / Consult your health care provider.  Haemophilus influenzae type b (Hib) vaccine.** / Consult your health care provider. Ages 40 to 64 years  Blood pressure check.** / Every year.  Lipid and cholesterol check.** / Every 5 years beginning at age 20 years.  Lung cancer screening. / Every year if you are aged 55-80 years and have a 30-pack-year history of smoking and currently smoke or have quit within the past 15 years. Yearly screening is stopped once you have quit smoking for at least 15 years or develop a health problem that would prevent you from having lung cancer treatment.  Clinical breast exam.** / Every year after age 40 years.  BRCA-related cancer risk assessment.** / For women who have family members with a BRCA-related cancer (breast, ovarian, tubal, or peritoneal cancers).  Mammogram.** / Every year beginning at age 40 years and continuing for as long as you are in good health. Consult with your health care provider.  Pap test.** / Every 3 years starting at age 30 years through age 65 or 70 years with a history of 3 consecutive normal Pap tests.  HPV screening.** / Every 3 years from ages 30 years through ages 65 to 70 years with a history of 3 consecutive normal Pap tests.  Fecal occult blood test (FOBT) of stool. /  Every year beginning at age 50 years and continuing until age 75 years. You may not need to do this test if you get a colonoscopy every 10 years.  Flexible sigmoidoscopy or colonoscopy.** / Every 5 years for a flexible sigmoidoscopy or every 10 years for a colonoscopy beginning at age 50 years and continuing until age 75 years.  Hepatitis C blood test.** / For all people born from 1945 through 1965 and any individual with known risks for hepatitis C.  Skin self-exam. / Monthly.  Influenza vaccine. / Every year.  Tetanus, diphtheria, and acellular pertussis (Tdap/Td) vaccine.** / Consult your health care provider. Pregnant women should receive 1 dose of Tdap vaccine during each pregnancy. 1 dose of Td every 10 years.  Varicella vaccine.** / Consult your health care provider. Pregnant females who do not have evidence of immunity should receive the first dose after pregnancy.  Zoster vaccine.** / 1 dose for adults aged 60 years or older.  Measles, mumps, rubella (MMR) vaccine.** / You need at least 1 dose of MMR if you were born in 1957 or later. You may also need a second dose. For females of childbearing age, rubella immunity should be determined. If there is no evidence of immunity, females who are not pregnant should be vaccinated. If there is no evidence of immunity, females who are pregnant should delay immunization until after pregnancy.  Pneumococcal 13-valent conjugate (PCV13) vaccine.** / Consult your health care provider.  Pneumococcal polysaccharide (PPSV23) vaccine.** / 1   to 2 doses if you smoke cigarettes or if you have certain conditions.  Meningococcal vaccine.** / Consult your health care provider.  Hepatitis A vaccine.** / Consult your health care provider.  Hepatitis B vaccine.** / Consult your health care provider.  Haemophilus influenzae type b (Hib) vaccine.** / Consult your health care provider. Ages 33 years and over  Blood pressure check.** / Every year.  Lipid  and cholesterol check.** / Every 5 years beginning at age 25 years.  Lung cancer screening. / Every year if you are aged 67-80 years and have a 30-pack-year history of smoking and currently smoke or have quit within the past 15 years. Yearly screening is stopped once you have quit smoking for at least 15 years or develop a health problem that would prevent you from having lung cancer treatment.  Clinical breast exam.** / Every year after age 39 years.  BRCA-related cancer risk assessment.** / For women who have family members with a BRCA-related cancer (breast, ovarian, tubal, or peritoneal cancers).  Mammogram.** / Every year beginning at age 56 years and continuing for as long as you are in good health. Consult with your health care provider.  Pap test.** / Every 3 years starting at age 22 years through age 74 or 48 years with 3 consecutive normal Pap tests. Testing can be stopped between 65 and 70 years with 3 consecutive normal Pap tests and no abnormal Pap or HPV tests in the past 10 years.  HPV screening.** / Every 3 years from ages 69 years through ages 76 or 69 years with a history of 3 consecutive normal Pap tests. Testing can be stopped between 65 and 70 years with 3 consecutive normal Pap tests and no abnormal Pap or HPV tests in the past 10 years.  Fecal occult blood test (FOBT) of stool. / Every year beginning at age 43 years and continuing until age 59 years. You may not need to do this test if you get a colonoscopy every 10 years.  Flexible sigmoidoscopy or colonoscopy.** / Every 5 years for a flexible sigmoidoscopy or every 10 years for a colonoscopy beginning at age 78 years and continuing until age 88 years.  Hepatitis C blood test.** / For all people born from [birth year] through 1965 and any individual with known risks for hepatitis C.  Osteoporosis screening.** / A one-time screening for women ages 71 years and over and women at risk for fractures or osteoporosis.  Skin  self-exam. / Monthly.  Influenza vaccine. / Every year.  Tetanus, diphtheria, and acellular pertussis (Tdap/Td) vaccine.** / 1 dose of Td every 10 years.  Varicella vaccine.** / Consult your health care provider.  Zoster vaccine.** / 1 dose for adults aged 62 years or older.  Pneumococcal 13-valent conjugate (PCV13) vaccine.** / Consult your health care provider.  Pneumococcal polysaccharide (PPSV23) vaccine.** / 1 dose for all adults aged 32 years and older.  Meningococcal vaccine.** / Consult your health care provider.  Hepatitis A vaccine.** / Consult your health care provider.  Hepatitis B vaccine.** / Consult your health care provider.  Haemophilus influenzae type b (Hib) vaccine.** / Consult your health care provider. ** Family history and personal history of risk and conditions may change your health care provider's recommendations.   This information is not intended to replace advice given to you by your health care provider. Make sure you discuss any questions you have with your health care provider.   Document Released: 05/11/2001 Document Revised: 04/05/2014 Document Reviewed: 08/10/2010 Elsevier Interactive  Patient Education 2016 [employer].

## 2015-02-03 ENCOUNTER — Other Ambulatory Visit: Payer: Self-pay | Admitting: Family Medicine

## 2015-02-03 MED ORDER — ESTROGENS CONJUGATED 0.625 MG PO TABS: 0.6250 mg | ORAL_TABLET | Freq: Every day | ORAL | Status: DC

## 2015-02-03 MED ORDER — TRIAMTERENE-HCTZ 37.5-25 MG PO TABS: 1.0000 | ORAL_TABLET | Freq: Every day | ORAL | Status: DC

## 2015-02-03 MED ORDER — POTASSIUM CHLORIDE CRYS ER 20 MEQ PO TBCR: EXTENDED_RELEASE_TABLET | ORAL | Status: DC

## 2015-02-09 ENCOUNTER — Encounter: Payer: Self-pay | Admitting: Family Medicine

## 2015-02-09 DIAGNOSIS — E876 Hypokalemia: Secondary | ICD-10-CM

## 2015-02-09 DIAGNOSIS — Z0001 Encounter for general adult medical examination with abnormal findings: Secondary | ICD-10-CM | POA: Insufficient documentation

## 2015-02-09 DIAGNOSIS — Z0000 Encounter for general adult medical examination without abnormal findings: Secondary | ICD-10-CM

## 2015-02-09 HISTORY — DX: Encounter for general adult medical examination without abnormal findings: Z00.00

## 2015-02-09 HISTORY — DX: Hypokalemia: E87.6

## 2015-02-09 HISTORY — DX: Encounter for general adult medical examination with abnormal findings: Z00.01

## 2015-02-09 NOTE — Progress Notes (Signed)
Subjective:    Patient ID: Sherri Sullivan, female    DOB: [birth year], 54 y.o.   MRN: [id number]  Chief Complaint  Patient presents with  . Annual Exam    HPI Patient is in today for annual exam. She underwent low back surgery with fusion with neurosurgery in June of this year. She reports a definite helped her pain but she's been left with some right lower extremity weakness and some intermittent low back pain. She continues to be less active than she would like to be but she has recently been allowed to return to water aerobics. No acute illness or recent acute concern otherwise. Denies CP/palp/SOB/HA/congestion/fevers/GI or GU c/o. Taking meds as prescribed  Past Medical History  Diagnosis Date  . Dizziness   . HTN (hypertension)   . SVT (supraventricular tachycardia) ([place])   . Anginal pain ([place])   . Chicken pox as a child  . Measles as a child  . Measles as a child  . Obesity   . Uterine fibroid 12/17/2012    Cervical polyp per patient Follows with [facility], Dr Evette Cristal  . Dysrhythmia 2013    SVT- treated by ablation by Dr. Lovena Le to f/u with as needed basis   . SOB (shortness of breath) 09/23/11    "a little bit; at rest; before ablation", SOB again now (08/2014- due to lack  of exercise)   . Panic attack     during episode of feeling to crowded   . 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 Hypokalemia 02/09/2015  . Encounter for preventative adult health care exam with abnormal findings 02/09/2015  . Preventative health care 02/09/2015    Past Surgical History  Procedure Laterality Date  . Cardiac electrophysiology study and ablation  09/23/11  . Abdominal hysterectomy  1990's  . Tubal ligation  1980's  . Supraventricular tachycardia ablation N/A 09/23/2011    Procedure: SUPRAVENTRICULAR TACHYCARDIA ABLATION;  Surgeon: Evans Lance, MD;  Location: [facility] CATH LAB;  Service: Cardiovascular;  Laterality: N/A;  .  Maximum access (mas)posterior lumbar interbody fusion (plif) 1 level N/A 09/24/2014    Procedure: L5-S1 MAS PLIF ;  Surgeon: Erline Levine, MD;  Location: [place] NEURO ORS;  Service: Neurosurgery;  Laterality: N/A;  L5-S1 MAS PLIF fusion    Family History  Problem Relation Age of Onset  . Cancer Sister     lung  . Diabetes Maternal Aunt   . Kidney disease Maternal Aunt   . Diabetes Mother     type 2  . Heart disease Mother   . Heart disease Father   . Diabetes Maternal Uncle   . Hypertension Daughter   . Stroke Maternal Grandmother     Social History   Social History  . Marital Status: Married    Spouse Name: N/A  . Number of Children: N/A  . Years of Education: N/A   Occupational History  . Not on file.   Social History Main Topics  . Smoking status: Former Smoker -- 0.12 packs/day for 8 years    Types: Cigarettes    Quit date: 03/29/1984  . Smokeless tobacco: Never Used  . Alcohol Use: 8.4 oz/week    14 Glasses of wine per week  . Drug Use: No  . Sexual Activity: Not Currently   Other Topics Concern  . Not on file   Social History Narrative    Outpatient Prescriptions Prior to Visit  Medication Sig Dispense Refill  . cetirizine (ZYRTEC) 10 MG tablet Take 10 mg by mouth daily.    . methocarbamol (ROBAXIN) 500 MG tablet Take 1 tablet (500 mg total) by mouth every 6 (six) hours as needed for muscle spasms. 80 tablet 1  . Multiple Vitamins-Minerals (MULTIVITAMIN WITH MINERALS) tablet Take 1 tablet by mouth 3 (three) times a week.    . OMEGA-3 KRILL OIL PO Take 353 mg by mouth daily. MegaRed Joint Care    . oxyCODONE-acetaminophen (PERCOCET/ROXICET) 5-325 MG per tablet Take 1-2 tablets by mouth every 4 (four) hours as needed for moderate pain or severe pain. 100 tablet 0  . Probiotic Product (PROBIOTIC PO) Take 2 tablets by mouth at bedtime.    . chlorthalidone (HYGROTON) 25 MG tablet TAKE 1 TABLET (25 MG) BY MOUTH DAILY. 90 tablet 0  . estrogens, conjugated, (PREMARIN)  0.625 MG tablet Take 0.625 mg by mouth daily. Take daily for 21 days then do not take for 7 days.    . naproxen sodium (ANAPROX) 220 MG tablet Take 440 mg by mouth daily as needed (pain). ALEVE    . potassium chloride SA (K-DUR,KLOR-CON) 20 MEQ tablet TAKE 1 TABLET (20 MEQ TOTAL) BY MOUTH 2 (TWO) TIMES DAILY. (Patient taking differently: Take 20 mEq by mouth 2 (two) times daily. ) 180 tablet 0  . cyclobenzaprine (FLEXERIL) 5 MG tablet Take 1 tablet (5 mg total) by mouth 3 (three) times daily as needed. For muscle pain (Patient not taking: Reported on 09/13/2014) 60 tablet 3  . traMADol (ULTRAM) 50 MG tablet 1-2 tabs by mouth Q8 hours, maximum 6 tabs per day. (Patient taking differently: Take 50-100 mg by mouth every 8 (eight) hours as needed (pain). maximum 6 tabs per day.) 90 tablet 0   No facility-administered medications prior to visit.    No Known Allergies  Review of Systems  Constitutional: Negative for fever, chills and malaise/fatigue.  HENT: Negative for congestion and hearing loss.   Eyes: Negative for discharge.  Respiratory: Negative for cough, sputum production and shortness of breath.   Cardiovascular: Negative for chest pain, palpitations and leg swelling.  Gastrointestinal: Negative for heartburn, nausea, vomiting, abdominal pain, diarrhea, constipation and blood in stool.  Genitourinary: Negative for dysuria, urgency, frequency and hematuria.  Musculoskeletal: Positive for back pain and joint pain. Negative for myalgias and falls.  Skin: Negative for rash.  Neurological: Positive for focal weakness. Negative for dizziness, sensory change, loss of consciousness, weakness and headaches.  Endo/Heme/Allergies: Negative for environmental allergies. Does not bruise/bleed easily.  Psychiatric/Behavioral: Negative for depression and suicidal ideas. The patient is not nervous/anxious and does not have insomnia.        Objective:    Physical Exam  Constitutional: She is oriented  to person, place, and time. She appears well-developed and well-nourished. No distress.  HENT:  Head: Normocephalic and atraumatic.  Eyes: Conjunctivae are normal.  Neck: Neck supple. No thyromegaly present.  Cardiovascular: Normal rate, regular rhythm and normal heart sounds.   No murmur heard. Pulmonary/Chest: Effort normal and breath sounds normal. No respiratory distress.  Abdominal: Soft. Bowel sounds are normal. She exhibits no distension and no mass. There is no tenderness.  Musculoskeletal: She exhibits no edema.  Lymphadenopathy:    She has no cervical adenopathy.  Neurological: She is alert and oriented to person, place, and time.  Skin: Skin is warm and dry.  Psychiatric: She has a normal mood and affect. Her behavior is normal.    BP 118/82  mmHg  Pulse 75  Temp(Src) 98.3 F (36.8 C) (Oral)  Ht 5\' 3"  (1.6 m)  Wt 214 lb (97.07 kg)  BMI 37.92 kg/m2  SpO2 95% Wt Readings from Last 3 Encounters:  01/31/15 214 lb (97.07 kg)  09/24/14 215 lb (97.523 kg)  09/13/14 215 lb (97.523 kg)     Lab Results  Component Value Date   WBC 6.8 01/31/2015   HGB 13.6 01/31/2015   HCT 41.7 01/31/2015   PLT 298 01/31/2015   GLUCOSE 85 01/31/2015   CHOL 245* 01/31/2015   TRIG 118 01/31/2015   HDL 75 01/31/2015   LDLCALC 146* 01/31/2015   ALT 11 01/31/2015   AST 15 01/31/2015   NA 138 01/31/2015   K 3.2* 01/31/2015   CL 102 01/31/2015   CREATININE 0.65 01/31/2015   BUN 12 01/31/2015   CO2 26 01/31/2015   TSH 0.636 01/31/2015    Lab Results  Component Value Date   TSH 0.636 01/31/2015   Lab Results  Component Value Date   WBC 6.8 01/31/2015   HGB 13.6 01/31/2015   HCT 41.7 01/31/2015   MCV 85.1 01/31/2015   PLT 298 01/31/2015   Lab Results  Component Value Date   NA 138 01/31/2015   K 3.2* 01/31/2015   CO2 26 01/31/2015   GLUCOSE 85 01/31/2015   BUN 12 01/31/2015   CREATININE 0.65 01/31/2015   BILITOT 0.9 01/31/2015   ALKPHOS 62 01/31/2015   AST 15  01/31/2015   ALT 11 01/31/2015   PROT 7.4 01/31/2015   ALBUMIN 4.0 01/31/2015   CALCIUM 9.3 01/31/2015   ANIONGAP 9 09/13/2014   GFR 96.30 01/10/2014   Lab Results  Component Value Date   CHOL 245* 01/31/2015   Lab Results  Component Value Date   HDL 75 01/31/2015   Lab Results  Component Value Date   LDLCALC 146* 01/31/2015   Lab Results  Component Value Date   TRIG 118 01/31/2015   Lab Results  Component Value Date   CHOLHDL 3.3 01/31/2015   No results found for: HGBA1C     Assessment & Plan:   Problem List Items Addressed This Visit    Hyperlipidemia    Encouraged heart healthy diet, increase exercise, avoid trans fats, consider a krill oil cap daily      Relevant Orders   TSH (Completed)   CBC (Completed)   Comprehensive metabolic panel (Completed)   Lipid panel (Completed)   LOW BACK PAIN    Continues to struggle with some low back pain and right lower extremity weakness. Had surgery with Dr Vertell Limber of neurosurgery in June and it has been helpful to a great degree. Is given a short dose of Medrol. May continue Robaxin prn      Relevant Medications   methylPREDNISolone (MEDROL) 4 MG tablet   Other Relevant Orders   TSH (Completed)   CBC (Completed)   Comprehensive metabolic panel (Completed)   Lipid panel (Completed)   SVT (supraventricular tachycardia) (HCC)   Relevant Orders   TSH (Completed)   CBC (Completed)   Comprehensive metabolic panel (Completed)   Lipid panel (Completed)   Obesity    Encouraged DASH diet, decrease po intake and increase exercise as tolerated. Needs 7-8 hours of sleep nightly. Avoid trans fats, eat small, frequent meals every 4-5 hours with lean proteins, complex carbs and healthy fats. Minimize simple carbs, GMO foods.      Relevant Orders   TSH (Completed)   CBC (Completed)  Comprehensive metabolic panel (Completed)   Lipid panel (Completed)   Essential hypertension, benign    Well controlled, no changes to meds.  Encouraged heart healthy diet such as the DASH diet and exercise as tolerated.       Relevant Orders   TSH (Completed)   CBC (Completed)   Comprehensive metabolic panel (Completed)   Lipid panel (Completed)   Hypokalemia    Increase KCL to 20 meq to bid      Encounter for preventative adult health care exam with abnormal findings   Preventative health care    Patient encouraged to maintain heart healthy diet, regular exercise, adequate sleep. Consider daily probiotics. Take medications as prescribed. Labs reviewed. Given and reviewed copy of ACP documents from [facility] Secretary of State and encouraged to complete and return       Other Visit Diagnoses    Low back pain with right-sided sciatica, unspecified back pain laterality    -  Primary    Relevant Medications    methylPREDNISolone (MEDROL) 4 MG tablet    Other Relevant Orders    TSH (Completed)    CBC (Completed)    Comprehensive metabolic panel (Completed)    Lipid panel (Completed)    Breast cancer screening        Relevant Orders    TSH (Completed)    CBC (Completed)    Comprehensive metabolic panel (Completed)    Lipid panel (Completed)    MM Digital Screening       I have discontinued Ms. Amoroso's cyclobenzaprine, traMADol, and naproxen sodium. I am also having her start on methylPREDNISolone. Additionally, I am having her maintain her OMEGA-3 KRILL OIL PO, cetirizine, multivitamin with minerals, Probiotic Product (PROBIOTIC PO), oxyCODONE-acetaminophen, and methocarbamol.  Meds ordered this encounter  Medications  . methylPREDNISolone (MEDROL) 4 MG tablet    Sig: 6 tabs po x 1 d then 5 tabs po x 1 d then 4 tabs po x 1 d then 3 tabs po x 1 d then 2 tabs po x 1 d then 1 tab po x 1 d    Dispense:  21 tablet    Refill:  0     Penni Homans, MD

## 2015-02-09 NOTE — Assessment & Plan Note (Signed)
Encouraged heart healthy diet, increase exercise, avoid trans fats, consider a krill oil cap daily 

## 2015-02-09 NOTE — Progress Notes (Deleted)
Sherri Sullivan [id number] [birth year] 02/09/2015      Progress Note New Patient  Subjective  Chief Complaint  Chief Complaint  Patient presents with  . Annual Exam    HPI  ***  Past Medical History  Diagnosis Date  . Dizziness   . HTN (hypertension)   . SVT (supraventricular tachycardia) ([place])   . Anginal pain ([place])   . Chicken pox as a child  . Measles as a child  . Measles as a child  . Obesity   . Uterine fibroid 12/17/2012    Cervical polyp per patient Follows with [facility], Dr Evette Cristal  . Dysrhythmia 2013    SVT- treated by ablation by Dr. Lovena Le to f/u with as needed basis   . SOB (shortness of breath) 09/23/11    "a little bit; at rest; before ablation", SOB again now (08/2014- due to lack  of exercise)   . Panic attack     during episode of feeling to crowded   . 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 Cardiac electrophysiology study and ablation  09/23/11  . Abdominal hysterectomy  1990's  . Tubal ligation  1980's  . Supraventricular tachycardia ablation N/A 09/23/2011    Procedure: SUPRAVENTRICULAR TACHYCARDIA ABLATION;  Surgeon: Evans Lance, MD;  Location: [facility] CATH LAB;  Service: Cardiovascular;  Laterality: N/A;  . Maximum access (mas)posterior lumbar interbody fusion (plif) 1 level N/A 09/24/2014    Procedure: L5-S1 MAS PLIF ;  Surgeon: Erline Levine, MD;  Location: [place] NEURO ORS;  Service: Neurosurgery;  Laterality: N/A;  L5-S1 MAS PLIF fusion    Family History  Problem Relation Age of Onset  . Cancer Sister     lung  . Diabetes Maternal Aunt   . Kidney disease Maternal Aunt   . Diabetes Mother     type 2  . Heart disease Mother   . Heart disease Father   . Diabetes Maternal Uncle   . Hypertension Daughter   . Stroke Maternal Grandmother     Social History   Social History  . Marital Status: Married    Spouse Name: N/A  . Number  of Children: N/A  . Years of Education: N/A   Occupational History  . Not on file.   Social History Main Topics  . Smoking status: Former Smoker -- 0.12 packs/day for 8 years    Types: Cigarettes    Quit date: 03/29/1984  . Smokeless tobacco: Never Used  . Alcohol Use: 8.4 oz/week    14 Glasses of wine per week  . Drug Use: No  . Sexual Activity: Not Currently   Other Topics Concern  . Not on file   Social History Narrative    Current Outpatient Prescriptions on File Prior to Visit  Medication Sig Dispense Refill  . cetirizine (ZYRTEC) 10 MG tablet Take 10 mg by mouth daily.    . methocarbamol (ROBAXIN) 500 MG tablet Take 1 tablet (500 mg total) by mouth every 6 (six) hours as needed for muscle spasms. 80 tablet 1  . Multiple Vitamins-Minerals (MULTIVITAMIN WITH MINERALS) tablet Take 1 tablet by mouth 3 (three) times a week.    . OMEGA-3 KRILL OIL PO Take 353 mg by mouth daily. MegaRed Joint Care    . oxyCODONE-acetaminophen (PERCOCET/ROXICET) 5-325 MG per tablet Take 1-2 tablets by mouth every 4 (four) hours as needed for  moderate pain or severe pain. 100 tablet 0  . Probiotic Product (PROBIOTIC PO) Take 2 tablets by mouth at bedtime.     No current facility-administered medications on file prior to visit.    No Known Allergies  Review of Systems  ***  Objective  BP 118/82 mmHg  Pulse 75  Temp(Src) 98.3 F (36.8 C) (Oral)  Ht 5\' 3"  (1.6 m)  Wt 214 lb (97.07 kg)  BMI 37.92 kg/m2  SpO2 95%  Physical Exam  ***     Assessment & Plan  Essential hypertension, benign Well controlled, no changes to meds. Encouraged heart healthy diet such as the DASH diet and exercise as tolerated.   Obesity Encouraged DASH diet, decrease po intake and increase exercise as tolerated. Needs 7-8 hours of sleep nightly. Avoid trans fats, eat small, frequent meals every 4-5 hours with lean proteins, complex carbs and healthy fats. Minimize simple carbs, GMO foods.

## 2015-02-09 NOTE — Assessment & Plan Note (Signed)
Patient encouraged to maintain heart healthy diet, regular exercise, adequate sleep. Consider daily probiotics. Take medications as prescribed. Labs reviewed. Given and reviewed copy of ACP documents from [place] Secretary of State and encouraged to complete and return 

## 2015-02-09 NOTE — Assessment & Plan Note (Signed)
Continues to struggle with some low back pain and right lower extremity weakness. Had surgery with Dr Vertell Limber of neurosurgery in June and it has been helpful to a great degree. Is given a short dose of Medrol. May continue Robaxin prn

## 2015-02-09 NOTE — Assessment & Plan Note (Signed)
Increase KCL to 20 meq to bid

## 2015-02-11 ENCOUNTER — Ambulatory Visit (HOSPITAL_BASED_OUTPATIENT_CLINIC_OR_DEPARTMENT_OTHER): Payer: 59

## 2015-02-18 ENCOUNTER — Other Ambulatory Visit (INDEPENDENT_AMBULATORY_CARE_PROVIDER_SITE_OTHER): Payer: 59

## 2015-02-18 ENCOUNTER — Ambulatory Visit (INDEPENDENT_AMBULATORY_CARE_PROVIDER_SITE_OTHER): Payer: 59 | Admitting: Family Medicine

## 2015-02-18 VITALS — BP 122/76 | HR 80

## 2015-02-18 DIAGNOSIS — I10 Essential (primary) hypertension: Secondary | ICD-10-CM

## 2015-02-18 DIAGNOSIS — Z0130 Encounter for examination of blood pressure without abnormal findings: Secondary | ICD-10-CM

## 2015-02-18 DIAGNOSIS — E876 Hypokalemia: Secondary | ICD-10-CM | POA: Diagnosis not present

## 2015-02-18 LAB — COMPREHENSIVE METABOLIC PANEL
ALK PHOS: 69 U/L (ref 39–117)
ALT: 12 U/L (ref 0–35)
AST: 15 U/L (ref 0–37)
Albumin: 4.1 g/dL (ref 3.5–5.2)
BILIRUBIN TOTAL: 0.6 mg/dL (ref 0.2–1.2)
BUN: 15 mg/dL (ref 6–23)
CO2: 28 mEq/L (ref 19–32)
CREATININE: 0.74 mg/dL (ref 0.40–1.20)
Calcium: 9.6 mg/dL (ref 8.4–10.5)
Chloride: 103 mEq/L (ref 96–112)
GFR: 104.93 mL/min (ref >60.00)
Glucose, Bld: 87 mg/dL (ref 70–99)
Potassium: 3.7 mEq/L (ref 3.5–5.1)
Sodium: 140 mEq/L (ref 135–145)
Total Protein: 7.5 g/dL (ref 6.0–8.3)

## 2015-02-18 NOTE — Progress Notes (Signed)
Agree with note. 

## 2015-02-18 NOTE — Progress Notes (Signed)
Pre visit review using our clinic review tool, if applicable. No additional management support is needed unless otherwise documented below in the visit note.  Patient presented in office for a blood pressure check per CMP lab visit note 01/31/15. Readings were as follow: B/P 122/76 P 80.  Per Dr. Charlett Blake: Continue current medication regimen. Informed patient of the provider's recommendation. She verbalized understanding and did not have any further questions or concerns.

## 2015-02-23 NOTE — Progress Notes (Signed)
documentation reviewed and agree with plan

## 2015-03-10 ENCOUNTER — Encounter: Payer: 59 | Admitting: Family Medicine

## 2015-03-18 ENCOUNTER — Other Ambulatory Visit: Payer: Self-pay | Admitting: Family Medicine

## 2015-03-18 ENCOUNTER — Ambulatory Visit (HOSPITAL_BASED_OUTPATIENT_CLINIC_OR_DEPARTMENT_OTHER)
Admission: RE | Admit: 2015-03-18 | Discharge: 2015-03-18 | Disposition: A | Discharge status: Home / Self Care | Payer: 59 | Source: Ambulatory Visit | Attending: Family Medicine | Admitting: Family Medicine

## 2015-03-18 DIAGNOSIS — Z1231 Encounter for screening mammogram for malignant neoplasm of breast: Secondary | ICD-10-CM | POA: Diagnosis not present

## 2015-03-18 DIAGNOSIS — Z1239 Encounter for other screening for malignant neoplasm of breast: Secondary | ICD-10-CM

## 2015-03-27 ENCOUNTER — Telehealth: Payer: Self-pay | Admitting: Family Medicine

## 2015-03-27 NOTE — Telephone Encounter (Signed)
Pt dropped off paperwork to be filled out (Application for Disability Parking Placard) and also left a copy of her [place] to be scan to her chart.

## 2015-03-27 NOTE — Telephone Encounter (Signed)
Forms forwarded to Dr. Charlett Blake, will be completed once she is back in the office. JG//CMA

## 2015-04-08 NOTE — Telephone Encounter (Signed)
Patient informed completed handicap placard paperwork ready for pick-up, placed at front/SLS 01/09  Completed paperwork sent to scan/sls 01/10

## 2015-04-10 MED FILL — METHOCARBAMOL 500 MG TABLET: 500 | 15 days supply | Qty: 60 | Fill #3

## 2015-04-23 DIAGNOSIS — M4807 Spinal stenosis, lumbosacral region: Secondary | ICD-10-CM | POA: Diagnosis not present

## 2015-04-23 DIAGNOSIS — M4317 Spondylolisthesis, lumbosacral region: Secondary | ICD-10-CM | POA: Diagnosis not present

## 2015-04-23 DIAGNOSIS — I10 Essential (primary) hypertension: Secondary | ICD-10-CM | POA: Diagnosis not present

## 2015-04-23 DIAGNOSIS — Z6839 Body mass index (BMI) 39.0-39.9, adult: Secondary | ICD-10-CM | POA: Diagnosis not present

## 2015-04-23 DIAGNOSIS — M545 Low back pain: Secondary | ICD-10-CM | POA: Diagnosis not present

## 2015-04-23 MED FILL — GABAPENTIN 300 MG CAPSULE: 300 | 30 days supply | Qty: 90 | Fill #0

## 2015-04-24 DIAGNOSIS — H524 Presbyopia: Secondary | ICD-10-CM | POA: Diagnosis not present

## 2015-04-24 DIAGNOSIS — H52203 Unspecified astigmatism, bilateral: Secondary | ICD-10-CM | POA: Diagnosis not present

## 2015-04-29 MED FILL — TRIAMTERENE/HCTZ 37.5/25 TB: 37.5-25 | 90 days supply | Qty: 90 | Fill #1

## 2015-05-19 ENCOUNTER — Encounter: Payer: Self-pay | Admitting: Family Medicine

## 2015-05-19 MED FILL — POTASSIUM CL ER 20 MEQ TABL: 20 | 90 days supply | Qty: 180 | Fill #1

## 2015-05-20 ENCOUNTER — Other Ambulatory Visit: Payer: Self-pay | Admitting: Family Medicine

## 2015-05-20 MED ORDER — ESTROGENS CONJUGATED 0.9 MG PO TABS: 0.9000 mg | ORAL_TABLET | Freq: Every day | ORAL | Status: DC

## 2015-05-20 MED FILL — PREMARIN 0.9 MG TABLET: 0.9 | 90 days supply | Qty: 63 | Fill #0

## 2015-05-26 MED FILL — METHOCARBAMOL 500 MG TABLET: 500 | 15 days supply | Qty: 60 | Fill #1

## 2015-06-11 DIAGNOSIS — M19012 Primary osteoarthritis, left shoulder: Secondary | ICD-10-CM | POA: Diagnosis not present

## 2015-06-11 MED FILL — GABAPENTIN 300 MG CAPSULE: 300 | 30 days supply | Qty: 90 | Fill #1

## 2015-06-17 ENCOUNTER — Ambulatory Visit (INDEPENDENT_AMBULATORY_CARE_PROVIDER_SITE_OTHER): Payer: 59 | Admitting: Family Medicine

## 2015-06-17 VITALS — BP 122/76 | HR 82

## 2015-06-17 DIAGNOSIS — E876 Hypokalemia: Secondary | ICD-10-CM

## 2015-06-17 DIAGNOSIS — I10 Essential (primary) hypertension: Secondary | ICD-10-CM

## 2015-06-17 NOTE — Progress Notes (Signed)
Pre visit review using our clinic review tool, if applicable. No additional management support is needed unless otherwise documented below in the visit note.  Patient in office for a blood pressure check per patient e-mail note 05/19/15. Also, patient reported cramping in her legs, stating "normally when I feel like this my potassium level is low". She inquired about having a lab drawn after the visit; PCP made aware and verbal order given for CMP to be completed. Verified medications with the patient. Today's reading was as follow: BP 122/76 P 82.    Per Dr. Charlett Blake: Continue current medication regimen and follow-up in three months. Informed patient of the provider's instructions. She understood and did not voice any concerns.   Lab order was placed and patient went to the lab following nurse visit.  Appointment previously scheduled for 07/31/15 with PCP.

## 2015-06-17 NOTE — Progress Notes (Signed)
RN blood check note reviewed. Agree with documention and plan. 

## 2015-06-17 NOTE — Patient Instructions (Signed)
Per Dr. Charlett Blake: Continue current medication regimen and follow-up in three months.

## 2015-06-18 LAB — COMPLETE METABOLIC PANEL WITH GFR
ALBUMIN: 4.2 g/dL (ref 3.6–5.1)
ALT: 13 U/L (ref 6–29)
AST: 17 U/L (ref 10–35)
Alkaline Phosphatase: 63 U/L (ref 33–130)
BUN: 19 mg/dL (ref 7–25)
CALCIUM: 9.4 mg/dL (ref 8.6–10.4)
CHLORIDE: 99 mmol/L (ref 98–110)
CO2: 23 mmol/L (ref 20–31)
CREATININE: 0.66 mg/dL (ref 0.50–1.05)
GFR, Est African American: >89 mL/min (ref >=60)
GFR, Est Non African American: >89 mL/min (ref >=60)
Glucose, Bld: 87 mg/dL (ref 65–99)
Potassium: 4 mmol/L (ref 3.5–5.3)
Sodium: 138 mmol/L (ref 135–146)
Total Bilirubin: 0.6 mg/dL (ref 0.2–1.2)
Total Protein: 7.3 g/dL (ref 6.1–8.1)

## 2015-07-23 DIAGNOSIS — M4317 Spondylolisthesis, lumbosacral region: Secondary | ICD-10-CM | POA: Diagnosis not present

## 2015-07-23 DIAGNOSIS — M5416 Radiculopathy, lumbar region: Secondary | ICD-10-CM | POA: Diagnosis not present

## 2015-07-23 DIAGNOSIS — M4807 Spinal stenosis, lumbosacral region: Secondary | ICD-10-CM | POA: Diagnosis not present

## 2015-07-23 DIAGNOSIS — M545 Low back pain: Secondary | ICD-10-CM | POA: Diagnosis not present

## 2015-07-23 DIAGNOSIS — Z6839 Body mass index (BMI) 39.0-39.9, adult: Secondary | ICD-10-CM | POA: Diagnosis not present

## 2015-07-24 ENCOUNTER — Other Ambulatory Visit: Payer: Self-pay | Admitting: Family Medicine

## 2015-07-24 MED FILL — MELOXICAM 7.5 MG TABLET: 7.5 | 30 days supply | Qty: 60 | Fill #1

## 2015-07-24 MED FILL — TRIAMTERENE/HCTZ 37.5/25 TB: 37.5-25 | 90 days supply | Qty: 90 | Fill #0

## 2015-07-25 DIAGNOSIS — M5416 Radiculopathy, lumbar region: Secondary | ICD-10-CM | POA: Diagnosis not present

## 2015-07-30 DIAGNOSIS — M5416 Radiculopathy, lumbar region: Secondary | ICD-10-CM | POA: Diagnosis not present

## 2015-07-30 DIAGNOSIS — M5126 Other intervertebral disc displacement, lumbar region: Secondary | ICD-10-CM | POA: Diagnosis not present

## 2015-07-31 ENCOUNTER — Ambulatory Visit: Payer: 59 | Admitting: Family Medicine

## 2015-08-06 DIAGNOSIS — M545 Low back pain: Secondary | ICD-10-CM | POA: Diagnosis not present

## 2015-08-06 DIAGNOSIS — M5416 Radiculopathy, lumbar region: Secondary | ICD-10-CM | POA: Diagnosis not present

## 2015-08-06 DIAGNOSIS — M4317 Spondylolisthesis, lumbosacral region: Secondary | ICD-10-CM | POA: Diagnosis not present

## 2015-08-06 DIAGNOSIS — M4807 Spinal stenosis, lumbosacral region: Secondary | ICD-10-CM | POA: Diagnosis not present

## 2015-08-06 MED FILL — DICLOFENAC SOD EC 75 MG TAB: 75 | 30 days supply | Qty: 60 | Fill #0

## 2015-08-12 MED FILL — PREMARIN 0.9 MG TABLET: 0.9 | 90 days supply | Qty: 63 | Fill #1

## 2015-08-12 MED FILL — POTASSIUM CL ER 20 MEQ TABL: 20 | 90 days supply | Qty: 180 | Fill #2

## 2015-08-21 ENCOUNTER — Ambulatory Visit (INDEPENDENT_AMBULATORY_CARE_PROVIDER_SITE_OTHER): Payer: 59 | Admitting: Family Medicine

## 2015-08-21 ENCOUNTER — Encounter: Payer: Self-pay | Admitting: Family Medicine

## 2015-08-21 VITALS — BP 118/72 | HR 94 | Temp 98.4°F | Ht 63.0 in | Wt 227.1 lb

## 2015-08-21 DIAGNOSIS — M5441 Lumbago with sciatica, right side: Secondary | ICD-10-CM

## 2015-08-21 DIAGNOSIS — M545 Low back pain: Secondary | ICD-10-CM

## 2015-08-21 DIAGNOSIS — E669 Obesity, unspecified: Secondary | ICD-10-CM

## 2015-08-21 DIAGNOSIS — I10 Essential (primary) hypertension: Secondary | ICD-10-CM

## 2015-08-21 DIAGNOSIS — M5442 Lumbago with sciatica, left side: Secondary | ICD-10-CM

## 2015-08-21 DIAGNOSIS — E785 Hyperlipidemia, unspecified: Secondary | ICD-10-CM

## 2015-08-21 DIAGNOSIS — E876 Hypokalemia: Secondary | ICD-10-CM

## 2015-08-21 MED ORDER — ESTROGENS CONJUGATED 0.9 MG PO TABS: 0.9000 mg | ORAL_TABLET | Freq: Every day | ORAL | Status: DC

## 2015-08-21 MED ORDER — POTASSIUM CHLORIDE CRYS ER 20 MEQ PO TBCR: EXTENDED_RELEASE_TABLET | ORAL | Status: DC

## 2015-08-21 NOTE — Patient Instructions (Addendum)
Lidocaine patches Salon Pas and Aspercreme 4% patches  Back Pain, Adult Back pain is very common in adults.The cause of back pain is rarely dangerous and the pain often gets better over time.The cause of your back pain may not be known. Some common causes of back pain include:  Strain of the muscles or ligaments supporting the spine.  Wear and tear (degeneration) of the spinal disks.  Arthritis.  Direct injury to the back. For many people, back pain may return. Since back pain is rarely dangerous, most people can learn to manage this condition on their own. HOME CARE INSTRUCTIONS Watch your back pain for any changes. The following actions may help to lessen any discomfort you are feeling:  Remain active. It is stressful on your back to sit or stand in one place for long periods of time. Do not sit, drive, or stand in one place for more than 30 minutes at a time. Take short walks on even surfaces as soon as you are able.Try to increase the length of time you walk each day.  Exercise regularly as directed by your health care provider. Exercise helps your back heal faster. It also helps avoid future injury by keeping your muscles strong and flexible.  Do not stay in bed.Resting more than 1-2 days can delay your recovery.  Pay attention to your body when you bend and lift. The most comfortable positions are those that put less stress on your recovering back. Always use proper lifting techniques, including:  Bending your knees.  Keeping the load close to your body.  Avoiding twisting.  Find a comfortable position to sleep. Use a firm mattress and lie on your side with your knees slightly bent. If you lie on your back, put a pillow under your knees.  Avoid feeling anxious or stressed.Stress increases muscle tension and can worsen back pain.It is important to recognize when you are anxious or stressed and learn ways to manage it, such as with exercise.  Take medicines only as  directed by your health care provider. Over-the-counter medicines to reduce pain and inflammation are often the most helpful.Your health care provider may prescribe muscle relaxant drugs.These medicines help dull your pain so you can more quickly return to your normal activities and healthy exercise.  Apply ice to the injured area:  Put ice in a plastic bag.  Place a towel between your skin and the bag.  Leave the ice on for 20 minutes, 2-3 times a day for the first 2-3 days. After that, ice and heat may be alternated to reduce pain and spasms.  Maintain a healthy weight. Excess weight puts extra stress on your back and makes it difficult to maintain good posture. SEEK MEDICAL CARE IF:  You have pain that is not relieved with rest or medicine.  You have increasing pain going down into the legs or buttocks.  You have pain that does not improve in one week.  You have night pain.  You lose weight.  You have a fever or chills. SEEK IMMEDIATE MEDICAL CARE IF:   You develop new bowel or bladder control problems.  You have unusual weakness or numbness in your arms or legs.  You develop nausea or vomiting.  You develop abdominal pain.  You feel faint.   This information is not intended to replace advice given to you by your health care provider. Make sure you discuss any questions you have with your health care provider.   Document Released: 03/15/2005 Document Revised: 04/05/2014  Document Reviewed: 07/17/2013 Elsevier Interactive Patient Education [employer].

## 2015-08-21 NOTE — Progress Notes (Signed)
Pre visit review using our clinic review tool, if applicable. No additional management support is needed unless otherwise documented below in the visit note. 

## 2015-08-26 ENCOUNTER — Ambulatory Visit (INDEPENDENT_AMBULATORY_CARE_PROVIDER_SITE_OTHER): Payer: 59 | Admitting: Family Medicine

## 2015-08-26 ENCOUNTER — Encounter: Payer: Self-pay | Admitting: Family Medicine

## 2015-08-26 VITALS — BP 131/80 | HR 83 | Ht 63.0 in | Wt 220.0 lb

## 2015-08-26 DIAGNOSIS — M5441 Lumbago with sciatica, right side: Secondary | ICD-10-CM | POA: Diagnosis not present

## 2015-08-26 DIAGNOSIS — M5442 Lumbago with sciatica, left side: Secondary | ICD-10-CM | POA: Diagnosis not present

## 2015-08-26 NOTE — Patient Instructions (Signed)
You're describing a neuropathy into your legs but the MRI does not show an obvious cause of this. I'd recommend doing the nerve conduction studies but if these are too expensive I would try nortriptyline at bedtime.

## 2015-08-27 NOTE — Assessment & Plan Note (Signed)
Reviewed her recent MRI of her lumbar spine - Nothing on these images to account for her current symptoms (she dose have some fibrosis at L5-S1 and mild central narrowing at L3-4 but neither area appears to be causing neural compression).  I'm concerned she may have a peripheral neuropathy.  We discussed medications vs nerve conduction studies and will go ahead with the latter.  In meantime can take diclofenac with robaxin as needed.

## 2015-08-27 NOTE — Progress Notes (Signed)
PCP and consultation requested by: Penni Homans, MD  Subjective:   HPI: Patient is a 55 y.o. female here for bilateral leg pain, back pain.  Patient reports having long history of low back problems. Dates back to 1996 when she had a back injury - improved after seeing a chiropractor. Has had intermittent problems since then. Reached its peak last year - pain was not improving with chiropractic care, physical therapy, ESIs and she went on to have surgery 09/22/2014 by Dr. Vertell Limber (L5-S1 fusion). She reports she had some improvement in right leg pain after surgery but by January pain returned. She has pain with walking, sitting, standing a long time. Radiates into both lower legs with tingling (reports it spares thigh areas though). No bowel/bladder dysfunction.  Past Medical History  Diagnosis Date  . Dizziness   . HTN (hypertension)   . SVT (supraventricular tachycardia) ([place])   . Anginal pain ([place])   . Chicken pox as a child  . Measles as a child  . Measles as a child  . Obesity   . Uterine fibroid 12/17/2012    Cervical polyp per patient Follows with [facility], Dr Evette Cristal  . Dysrhythmia 2013    SVT- treated by ablation by Dr. Lovena Le to f/u with as needed basis   . SOB (shortness of breath) 09/23/11    "a little bit; at rest; before ablation", SOB again now (08/2014- due to lack  of exercise)   . Panic attack     during episode of feeling to crowded   . 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 Hypokalemia 02/09/2015  . Encounter for preventative adult health care exam with abnormal findings 02/09/2015  . Preventative health care 02/09/2015    Current Outpatient Prescriptions on File Prior to Visit  Medication Sig Dispense Refill  . cetirizine (ZYRTEC) 10 MG tablet Take 10 mg by mouth daily.    . diclofenac (VOLTAREN) 75 MG EC tablet Take 75 mg by mouth 2 (two) times daily.    Marland Kitchen estrogens, conjugated, (PREMARIN) 0.9  MG tablet Take 1 tablet (0.9 mg total) by mouth daily. Take daily for 21 days then do not take for 7 days. 70 tablet 1  . methocarbamol (ROBAXIN) 500 MG tablet Take 1 tablet (500 mg total) by mouth every 6 (six) hours as needed for muscle spasms. 80 tablet 1  . Multiple Vitamins-Minerals (MULTIVITAMIN WITH MINERALS) tablet Take 1 tablet by mouth 3 (three) times a week.    . OMEGA-3 KRILL OIL PO Take 353 mg by mouth daily. MegaRed Joint Care    . potassium chloride SA (K-DUR,KLOR-CON) 20 MEQ tablet TAKE 1 TABLET (20 MEQ TOTAL) BY MOUTH 2 (TWO) TIMES DAILY. 180 tablet 2  . Probiotic Product (PROBIOTIC PO) Take 2 tablets by mouth at bedtime.    . triamterene-hydrochlorothiazide (MAXZIDE-25) 37.5-25 MG tablet TAKE 1 TABLET BY MOUTH DAILY. 90 tablet 1   No current facility-administered medications on file prior to visit.    Past Surgical History  Procedure Laterality Date  . Cardiac electrophysiology study and ablation  09/23/11  . Abdominal hysterectomy  1990's  . Tubal ligation  1980's  . Supraventricular tachycardia ablation N/A 09/23/2011    Procedure: SUPRAVENTRICULAR TACHYCARDIA ABLATION;  Surgeon: Evans Lance, MD;  Location: [facility] CATH LAB;  Service: Cardiovascular;  Laterality: N/A;  . Maximum access (mas)posterior lumbar interbody fusion (plif) 1 level N/A 09/24/2014    Procedure: L5-S1  MAS PLIF ;  Surgeon: Erline Levine, MD;  Location: [place] NEURO ORS;  Service: Neurosurgery;  Laterality: N/A;  L5-S1 MAS PLIF fusion    No Known Allergies  Social History   Social History  . Marital Status: Married    Spouse Name: N/A  . Number of Children: N/A  . Years of Education: N/A   Occupational History  . Not on file.   Social History Main Topics  . Smoking status: Former Smoker -- 0.12 packs/day for 8 years    Types: Cigarettes    Quit date: 03/29/1984  . Smokeless tobacco: Never Used  . Alcohol Use: 8.4 oz/week    14 Glasses of wine per week  . Drug Use: No  . Sexual Activity: Not  Currently   Other Topics Concern  . Not on file   Social History Narrative    Family History  Problem Relation Age of Onset  . Cancer Sister     lung  . Diabetes Maternal Aunt   . Kidney disease Maternal Aunt   . Diabetes Mother     type 2  . Heart disease Mother   . Heart disease Father   . Diabetes Maternal Uncle   . Hypertension Daughter   . Stroke Maternal Grandmother     BP 131/80 mmHg  Pulse 83  Ht 5\' 3"  (1.6 m)  Wt 220 lb (99.791 kg)  BMI 38.98 kg/m2  Review of Systems: See HPI above.    Objective:  Physical Exam:  Gen: NAD, comfortable in exam room  Back: No gross deformity, scoliosis. TTP minimally bilateral paraspinal lumbar regions.  No midline or bony TTP. FROM without pain. Strength LEs 5/5 all muscle groups.   2+ MSRs in patellar and 1+ achilles tendons, equal bilaterally. Negative SLRs. Sensation intact to light touch bilaterally currently. Negative logroll bilateral hips Negative fabers and piriformis stretches.    Assessment & Plan:  1. Low back pain with bilateral lower leg tingling - Reviewed her recent MRI of her lumbar spine - Nothing on these images to account for her current symptoms (she dose have some fibrosis at L5-S1 and mild central narrowing at L3-4 but neither area appears to be causing neural compression).  I'm concerned she may have a peripheral neuropathy.  We discussed medications vs nerve conduction studies and will go ahead with the latter.  In meantime can take diclofenac with robaxin as needed.

## 2015-08-31 NOTE — Assessment & Plan Note (Signed)
Encouraged moist heat and gentle stretching as tolerated. May try NSAIDs and prescription meds as directed and report if symptoms worsen or seek immediate care. Recent MRI reviewed and referred to Sports Med for further evaluation

## 2015-08-31 NOTE — Assessment & Plan Note (Signed)
Encouraged DASH diet, decrease po intake and increase exercise as tolerated. Needs 7-8 hours of sleep nightly. Avoid trans fats, eat small, frequent meals every 4-5 hours with lean proteins, complex carbs and healthy fats. Minimize simple carbs 

## 2015-08-31 NOTE — Progress Notes (Signed)
Patient ID: Sherri Sullivan, female   DOB: January 11, 1961, 55 y.o.   MRN: [id number]   Subjective:    Patient ID: Sherri Sullivan, female    DOB: [birth year], 55 y.o.   MRN: [id number]  Chief Complaint  Patient presents with  . Follow-up    HPI Patient is in today for follow up. She continues to struggle with back and shoulder pain. No recent injury, fall or worsening. No incontinence. Otherwise doing well. Denies CP/palp/SOB/HA/congestion/fevers/GI or GU c/o. Taking meds as prescribed  Past Medical History  Diagnosis Date  . Dizziness   . HTN (hypertension)   . SVT (supraventricular tachycardia) ([place])   . Anginal pain ([place])   . Chicken pox as a child  . Measles as a child  . Measles as a child  . Obesity   . Uterine fibroid 12/17/2012    Cervical polyp per patient Follows with [facility], Dr Evette Cristal  . Dysrhythmia 2013    SVT- treated by ablation by Dr. Lovena Le to f/u with as needed basis   . SOB (shortness of breath) 09/23/11    "a little bit; at rest; before ablation", SOB again now (08/2014- due to lack  of exercise)   . Panic attack     during episode of feeling to crowded   . 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 Hypokalemia 02/09/2015  . Encounter for preventative adult health care exam with abnormal findings 02/09/2015  . Preventative health care 02/09/2015    Past Surgical History  Procedure Laterality Date  . Cardiac electrophysiology study and ablation  09/23/11  . Abdominal hysterectomy  1990's  . Tubal ligation  1980's  . Supraventricular tachycardia ablation N/A 09/23/2011    Procedure: SUPRAVENTRICULAR TACHYCARDIA ABLATION;  Surgeon: Evans Lance, MD;  Location: [facility] CATH LAB;  Service: Cardiovascular;  Laterality: N/A;  . Maximum access (mas)posterior lumbar interbody fusion (plif) 1 level N/A 09/24/2014    Procedure: L5-S1 MAS PLIF ;  Surgeon: Erline Levine, MD;  Location: [place] NEURO ORS;  Service: Neurosurgery;   Laterality: N/A;  L5-S1 MAS PLIF fusion    Family History  Problem Relation Age of Onset  . Cancer Sister     lung  . Diabetes Maternal Aunt   . Kidney disease Maternal Aunt   . Diabetes Mother     type 2  . Heart disease Mother   . Heart disease Father   . Diabetes Maternal Uncle   . Hypertension Daughter   . Stroke Maternal Grandmother     Social History   Social History  . Marital Status: Married    Spouse Name: N/A  . Number of Children: N/A  . Years of Education: N/A   Occupational History  . Not on file.   Social History Main Topics  . Smoking status: Former Smoker -- 0.12 packs/day for 8 years    Types: Cigarettes    Quit date: 03/29/1984  . Smokeless tobacco: Never Used  . Alcohol Use: 8.4 oz/week    14 Glasses of wine per week  . Drug Use: No  . Sexual Activity: Not Currently   Other Topics Concern  . Not on file   Social History Narrative    Outpatient Prescriptions Prior to Visit  Medication Sig Dispense Refill  . cetirizine (ZYRTEC) 10 MG tablet Take 10 mg by mouth daily.    . methocarbamol (ROBAXIN) 500 MG tablet Take 1 tablet (500 mg  total) by mouth every 6 (six) hours as needed for muscle spasms. 80 tablet 1  . Multiple Vitamins-Minerals (MULTIVITAMIN WITH MINERALS) tablet Take 1 tablet by mouth 3 (three) times a week.    . OMEGA-3 KRILL OIL PO Take 353 mg by mouth daily. MegaRed Joint Care    . Probiotic Product (PROBIOTIC PO) Take 2 tablets by mouth at bedtime.    . triamterene-hydrochlorothiazide (MAXZIDE-25) 37.5-25 MG tablet TAKE 1 TABLET BY MOUTH DAILY. 90 tablet 1  . estrogens, conjugated, (PREMARIN) 0.9 MG tablet Take 1 tablet (0.9 mg total) by mouth daily. Take daily for 21 days then do not take for 7 days. 70 tablet 1  . potassium chloride SA (K-DUR,KLOR-CON) 20 MEQ tablet TAKE 1 TABLET (20 MEQ TOTAL) BY MOUTH 2 (TWO) TIMES DAILY. 180 tablet 2  . methylPREDNISolone (MEDROL) 4 MG tablet 6 tabs po x 1 d then 5 tabs po x 1 d then 4 tabs po  x 1 d then 3 tabs po x 1 d then 2 tabs po x 1 d then 1 tab po x 1 d 21 tablet 0  . oxyCODONE-acetaminophen (PERCOCET/ROXICET) 5-325 MG per tablet Take 1-2 tablets by mouth every 4 (four) hours as needed for moderate pain or severe pain. 100 tablet 0   No facility-administered medications prior to visit.    No Known Allergies  Review of Systems  Constitutional: Negative for fever and malaise/fatigue.  HENT: Negative for congestion.   Eyes: Negative for blurred vision.  Respiratory: Negative for shortness of breath.   Cardiovascular: Negative for chest pain, palpitations and leg swelling.  Gastrointestinal: Negative for nausea, abdominal pain and blood in stool.  Genitourinary: Negative for dysuria and frequency.  Musculoskeletal: Negative for falls.  Skin: Negative for rash.  Neurological: Negative for dizziness, loss of consciousness and headaches.  Endo/Heme/Allergies: Negative for environmental allergies.  Psychiatric/Behavioral: Negative for depression. The patient is not nervous/anxious.        Objective:    Physical Exam  Constitutional: She is oriented to person, place, and time. She appears well-developed and well-nourished. No distress.  HENT:  Head: Normocephalic and atraumatic.  Right Ear: External ear normal.  Left Ear: External ear normal.  Nose: Nose normal.  Mouth/Throat: Oropharynx is clear and moist.  Eyes: Conjunctivae and EOM are normal. Pupils are equal, round, and reactive to light. Right eye exhibits no discharge. Left eye exhibits no discharge.  Neck: Normal range of motion. Neck supple. No JVD present. No thyromegaly present.  Cardiovascular: Normal rate, regular rhythm, normal heart sounds and intact distal pulses.   No murmur heard. Pulmonary/Chest: Effort normal and breath sounds normal. No respiratory distress. She has no wheezes. She has no rales. She exhibits no tenderness.  Abdominal: Soft. Bowel sounds are normal. She exhibits no distension and no  mass. There is no tenderness. There is no rebound and no guarding.  Musculoskeletal: Normal range of motion. She exhibits no edema or tenderness.  Lymphadenopathy:    She has no cervical adenopathy.  Neurological: She is alert and oriented to person, place, and time. She has normal reflexes. No cranial nerve deficit.  Skin: Skin is warm and dry. No rash noted. She is not diaphoretic. No erythema.  Psychiatric: She has a normal mood and affect. Her behavior is normal. Judgment and thought content normal.  Nursing note and vitals reviewed.   BP 118/72 mmHg  Pulse 94  Temp(Src) 98.4 F (36.9 C) (Oral)  Ht 5\' 3"  (1.6 m)  Wt 227 lb  2 oz (103.023 kg)  BMI 40.24 kg/m2  SpO2 92% Wt Readings from Last 3 Encounters:  08/26/15 220 lb (99.791 kg)  08/21/15 227 lb 2 oz (103.023 kg)  01/31/15 214 lb (97.07 kg)     Lab Results  Component Value Date   WBC 6.8 01/31/2015   HGB 13.6 01/31/2015   HCT 41.7 01/31/2015   PLT 298 01/31/2015   GLUCOSE 87 06/17/2015   CHOL 245* 01/31/2015   TRIG 118 01/31/2015   HDL 75 01/31/2015   LDLCALC 146* 01/31/2015   ALT 13 06/17/2015   AST 17 06/17/2015   NA 138 06/17/2015   K 4.0 06/17/2015   CL 99 06/17/2015   CREATININE 0.66 06/17/2015   BUN 19 06/17/2015   CO2 23 06/17/2015   TSH 0.636 01/31/2015    Lab Results  Component Value Date   TSH 0.636 01/31/2015   Lab Results  Component Value Date   WBC 6.8 01/31/2015   HGB 13.6 01/31/2015   HCT 41.7 01/31/2015   MCV 85.1 01/31/2015   PLT 298 01/31/2015   Lab Results  Component Value Date   NA 138 06/17/2015   K 4.0 06/17/2015   CO2 23 06/17/2015   GLUCOSE 87 06/17/2015   BUN 19 06/17/2015   CREATININE 0.66 06/17/2015   BILITOT 0.6 06/17/2015   ALKPHOS 63 06/17/2015   AST 17 06/17/2015   ALT 13 06/17/2015   PROT 7.3 06/17/2015   ALBUMIN 4.2 06/17/2015   CALCIUM 9.4 06/17/2015   ANIONGAP 9 09/13/2014   GFR 104.93 02/18/2015   Lab Results  Component Value Date   CHOL 245*  01/31/2015   Lab Results  Component Value Date   HDL 75 01/31/2015   Lab Results  Component Value Date   LDLCALC 146* 01/31/2015   Lab Results  Component Value Date   TRIG 118 01/31/2015   Lab Results  Component Value Date   CHOLHDL 3.3 01/31/2015   No results found for: HGBA1C     Assessment & Plan:   Problem List Items Addressed This Visit    Obesity    Encouraged DASH diet, decrease po intake and increase exercise as tolerated. Needs 7-8 hours of sleep nightly. Avoid trans fats, eat small, frequent meals every 4-5 hours with lean proteins, complex carbs and healthy fats. Minimize simple carbs      LOW BACK PAIN    Encouraged moist heat and gentle stretching as tolerated. May try NSAIDs and prescription meds as directed and report if symptoms worsen or seek immediate care. Recent MRI reviewed and referred to Sports Med for further evaluation      Relevant Medications   diclofenac (VOLTAREN) 75 MG EC tablet   Hypokalemia    Resolved with last blood draw. No changes      Hyperlipidemia - Primary    Encouraged heart healthy diet, increase exercise, avoid trans fats, consider a krill oil cap daily      Relevant Orders   Comprehensive metabolic panel   CBC with Differential/Platelet   TSH   Lipid panel   Essential hypertension, benign    Well controlled, no changes to meds. Encouraged heart healthy diet such as the DASH diet and exercise as tolerated.       RESOLVED: Backache    Encouraged moist heat and gentle stretching as tolerated. May try NSAIDs and prescription meds as directed and report if symptoms worsen or seek immediate care      Relevant Medications   diclofenac (VOLTAREN) 75  MG EC tablet   Other Relevant Orders   Ambulatory referral to Sports Medicine    Other Visit Diagnoses    Essential hypertension        Relevant Orders    Comprehensive metabolic panel    CBC with Differential/Platelet    TSH    Lipid panel       I have discontinued  Ms. Massett's oxyCODONE-acetaminophen and methylPREDNISolone. I am also having her maintain her OMEGA-3 KRILL OIL PO, cetirizine, multivitamin with minerals, Probiotic Product (PROBIOTIC PO), methocarbamol, triamterene-hydrochlorothiazide, diclofenac, estrogens (conjugated), and potassium chloride SA.  Meds ordered this encounter  Medications  . diclofenac (VOLTAREN) 75 MG EC tablet    Sig: Take 75 mg by mouth 2 (two) times daily.  Marland Kitchen estrogens, conjugated, (PREMARIN) 0.9 MG tablet    Sig: Take 1 tablet (0.9 mg total) by mouth daily. Take daily for 21 days then do not take for 7 days.    Dispense:  70 tablet    Refill:  1  . potassium chloride SA (K-DUR,KLOR-CON) 20 MEQ tablet    Sig: TAKE 1 TABLET (20 MEQ TOTAL) BY MOUTH 2 (TWO) TIMES DAILY.    Dispense:  180 tablet    Refill:  2     Penni Homans, MD

## 2015-08-31 NOTE — Assessment & Plan Note (Signed)
Resolved with last blood draw. No changes

## 2015-08-31 NOTE — Assessment & Plan Note (Signed)
Encouraged moist heat and gentle stretching as tolerated. May try NSAIDs and prescription meds as directed and report if symptoms worsen or seek immediate care 

## 2015-08-31 NOTE — Assessment & Plan Note (Signed)
Well controlled, no changes to meds. Encouraged heart healthy diet such as the DASH diet and exercise as tolerated.  °

## 2015-08-31 NOTE — Assessment & Plan Note (Signed)
Encouraged heart healthy diet, increase exercise, avoid trans fats, consider a krill oil cap daily 

## 2015-09-02 MED FILL — METHOCARBAMOL 500 MG TABLET: 500 | 15 days supply | Qty: 60 | Fill #2

## 2015-09-05 ENCOUNTER — Encounter: Payer: Self-pay | Admitting: Family Medicine

## 2015-09-05 MED FILL — DICLOFENAC SOD EC 75 MG TAB: 75 | 30 days supply | Qty: 60 | Fill #1

## 2015-10-02 ENCOUNTER — Ambulatory Visit (INDEPENDENT_AMBULATORY_CARE_PROVIDER_SITE_OTHER): Payer: Self-pay | Admitting: Neurology

## 2015-10-02 ENCOUNTER — Encounter: Payer: Self-pay | Admitting: Neurology

## 2015-10-02 ENCOUNTER — Ambulatory Visit (INDEPENDENT_AMBULATORY_CARE_PROVIDER_SITE_OTHER): Payer: 59 | Admitting: Neurology

## 2015-10-02 DIAGNOSIS — R202 Paresthesia of skin: Secondary | ICD-10-CM | POA: Diagnosis not present

## 2015-10-02 DIAGNOSIS — M545 Low back pain: Secondary | ICD-10-CM

## 2015-10-02 NOTE — Procedures (Signed)
     HISTORY:  Sherri Sullivan is a 55 year old patient with a history of low back surgery done on 09/24/2014. The patient did well initially, but in January 2017 she began having bilateral paresthesias below the knees without recurrence of back pain. She denies any weakness of the legs. The patient is being evaluated for these new symptoms.  NERVE CONDUCTION STUDIES:  Nerve conduction studies were performed on both lower extremities. The distal motor latencies and motor amplitudes for the peroneal and posterior tibial nerves were within normal limits. The nerve conduction velocities for these nerves were also normal. The H reflex latencies were normal. The sensory latencies for the peroneal nerves were within normal limits.   EMG STUDIES:  EMG study was performed on the right lower extremity:  The tibialis anterior muscle reveals 2 to 4K motor units with full recruitment. No fibrillations or positive waves were seen. The peroneus tertius muscle reveals 2 to 4K motor units with full recruitment. No fibrillations or positive waves were seen. The medial gastrocnemius muscle reveals 1 to 3K motor units with full recruitment. No fibrillations or positive waves were seen. The vastus lateralis muscle reveals 2 to 4K motor units with full recruitment. No fibrillations or positive waves were seen. The iliopsoas muscle reveals 2 to 4K motor units with full recruitment. No fibrillations or positive waves were seen. The biceps femoris muscle (long head) reveals 2 to 4K motor units with full recruitment. No fibrillations or positive waves were seen. The lumbosacral paraspinal muscles were tested at 3 levels, and revealed 2+ positive waves at all 3 levels tested. There was good relaxation.  EMG study was performed on the left lower extremity:  The tibialis anterior muscle reveals 2 to 4K motor units with full recruitment. No fibrillations or positive waves were seen. The peroneus tertius muscle reveals 2  to 5K motor units with slightly reduced recruitment. No fibrillations or positive waves were seen. The medial gastrocnemius muscle reveals 1 to 3K motor units with full recruitment. Increased insertional activity was seen. The vastus lateralis muscle reveals 2 to 4K motor units with full recruitment. No fibrillations or positive waves were seen. The iliopsoas muscle reveals 2 to 4K motor units with full recruitment. No fibrillations or positive waves were seen. The biceps femoris muscle (long head) reveals 2 to 4K motor units with full recruitment. No fibrillations or positive waves were seen. The lumbosacral paraspinal muscles were tested at 3 levels, and revealed 1+ positive waves at the upper and lower levels, 2+ positive waves at the middle level. There was good relaxation.   IMPRESSION:  Nerve conduction studies done on both lower extremities were within normal limits. No evidence of a peripheral neuropathy is seen. EMG evaluation of the right lower extremity was relatively unremarkable with exception of denervation seen in the lumbosacral paraspinal muscles. On the left side, there were findings in the leg of a very mild acute and chronic S1 radiculopathy, no other significant abnormalities were seen.  Jill Alexanders MD 10/02/2015 1:49 PM  [facility] [address] [place], [place] [address]  Phone [phone] Fax [phone]

## 2015-10-02 NOTE — Progress Notes (Signed)
Please refer to EMG and nerve conduction study procedure note. 

## 2015-10-03 MED FILL — DICLOFENAC SOD EC 75 MG TAB: 75 | 30 days supply | Qty: 60 | Fill #2

## 2015-11-10 ENCOUNTER — Other Ambulatory Visit: Payer: Self-pay | Admitting: Family Medicine

## 2015-11-10 MED ORDER — ESTROGENS CONJUGATED 0.9 MG PO TABS: 0.9000 mg | ORAL_TABLET | Freq: Every day | ORAL | 1 refills | Status: DC

## 2015-11-10 MED FILL — DICLOFENAC SOD EC 75 MG TAB: 75 | 30 days supply | Qty: 60 | Fill #3

## 2015-11-10 MED FILL — PREMARIN 0.9 MG TABLET: 0.9 | 21 days supply | Qty: 21 | Fill #0

## 2015-11-19 DIAGNOSIS — M5416 Radiculopathy, lumbar region: Secondary | ICD-10-CM | POA: Diagnosis not present

## 2015-11-19 DIAGNOSIS — Z6836 Body mass index (BMI) 36.0-36.9, adult: Secondary | ICD-10-CM | POA: Diagnosis not present

## 2015-11-19 DIAGNOSIS — M545 Low back pain: Secondary | ICD-10-CM | POA: Diagnosis not present

## 2015-11-19 DIAGNOSIS — M4807 Spinal stenosis, lumbosacral region: Secondary | ICD-10-CM | POA: Diagnosis not present

## 2015-12-02 ENCOUNTER — Ambulatory Visit: Payer: 59 | Attending: Neurosurgery | Admitting: Physical Therapy

## 2015-12-02 DIAGNOSIS — M5440 Lumbago with sciatica, unspecified side: Secondary | ICD-10-CM | POA: Insufficient documentation

## 2015-12-02 DIAGNOSIS — R262 Difficulty in walking, not elsewhere classified: Secondary | ICD-10-CM | POA: Diagnosis not present

## 2015-12-02 DIAGNOSIS — M6281 Muscle weakness (generalized): Secondary | ICD-10-CM | POA: Insufficient documentation

## 2015-12-02 MED FILL — METHOCARBAMOL 500 MG TABLET: 500 | 15 days supply | Qty: 60 | Fill #3

## 2015-12-02 NOTE — Therapy (Signed)
[facility] [address]  [place], [place], [address] Phone: [phone]   Fax:  [phone]  Physical Therapy Evaluation  Patient Details  Name: Sherri Sullivan MRN: [id number] Date of Birth: [birth year] Referring Provider: Marchia Meiers. Vertell Limber, MD  Encounter Date: 12/02/2015      PT End of Session - 12/02/15 1453    Visit Number 1   Number of Visits 16   Date for PT Re-Evaluation 01/30/16   PT Start Time 1401   PT Stop Time 1450   PT Time Calculation (min) 49 min   Activity Tolerance Patient tolerated treatment well   Behavior During Therapy [facility] for tasks assessed/performed      Past Medical History:  Diagnosis Date  . Anginal pain ([place])   . 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 Chicken pox as a child  . Dizziness   . Dysrhythmia 2013   SVT- treated by ablation by Dr. Lovena Le to f/u with as needed basis   . Encounter for preventative adult health care exam with abnormal findings 02/09/2015  . HTN (hypertension)   . Hypokalemia 02/09/2015  . Measles as a child  . Measles as a child  . Obesity   . Panic attack    during episode of feeling to crowded   . Preventative health care 02/09/2015  . SOB (shortness of breath) 09/23/11   "a little bit; at rest; before ablation", SOB again now (08/2014- due to lack  of exercise)   . SVT (supraventricular tachycardia) ([place])   . Uterine fibroid 12/17/2012   Cervical polyp per patient Follows with [facility], Dr Evette Cristal    Past Surgical History:  Procedure Laterality Date  . ABDOMINAL HYSTERECTOMY  1990's  . CARDIAC ELECTROPHYSIOLOGY STUDY AND ABLATION  09/23/11  . MAXIMUM ACCESS (MAS)POSTERIOR LUMBAR INTERBODY FUSION (PLIF) 1 LEVEL N/A 09/24/2014   Procedure: L5-S1 MAS PLIF ;  Surgeon: Erline Levine, MD;  Location: [place] NEURO ORS;  Service: Neurosurgery;  Laterality: N/A;  L5-S1 MAS PLIF fusion  . SUPRAVENTRICULAR  TACHYCARDIA ABLATION N/A 09/23/2011   Procedure: SUPRAVENTRICULAR TACHYCARDIA ABLATION;  Surgeon: Evans Lance, MD;  Location: [facility] CATH LAB;  Service: Cardiovascular;  Laterality: N/A;  . TUBAL LIGATION  1980's    There were no vitals filed for this visit.       Subjective Assessment - 12/02/15 1407    Subjective Pt reports problems with her back dating back to HNP in 1996. Had previously tried PT and other interventions w/o relief of pain, therefore underwent spinal fusion at L5-S1 on 09/24/14. Pt noted some improvement initially after surgery but feels like it has recently been getting worse with burning pain in low back and now more radicular pain down L LE whereas previously it had been on the R.   Pertinent History L5-S1 fusion on 09/24/14   Limitations Sitting;Standing;Walking;Lifting;Other (comment)  Lying   How long can you sit comfortably? 15-20  minutes   How long can you stand comfortably? 20 minutes   How long can you walk comfortably? 15 minutes   Patient Stated Goals "Get through daily chores w/o being above an 8/10 pain"   Currently in Pain? Yes   Pain Score 6   Least 5/10; Avg 6-7/10; Worst 9/10   Pain Location Back   Pain Orientation Mid;Lower   Pain Descriptors / Indicators Burning   Pain Type Acute pain;Chronic pain  Pain Radiating Towards intermittent stabbing pain in L>R calf (no proximal LE pain) - mostly at night   Pain Onset More than a month ago   Pain Frequency Constant   Aggravating Factors  lying down; prolonged static positioning in sitting or standing; walking for long periods   Pain Relieving Factors changing positions frequently, heat   Effect of Pain on Daily Activities avoid lifting, difficulty climbing stairs (no c/o on descent)            [facility] Assessment - 12/02/15 1400      Assessment   Medical Diagnosis Lumbar radiculopathy   Referring Provider Danae OrleansJoseph D. Venetia MaxonStern, MD   Onset Date/Surgical Date --  January 2017   Next MD Visit 01/19/16    Prior Therapy 2014     Balance Screen   Has the patient fallen in the past 6 months No   Has the patient had a decrease in activity level because of a fear of falling?  Yes   Is the patient reluctant to leave their home because of a fear of falling?  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 mostly sedentary     Observation/Other Assessments   Focus on Therapeutic Outcomes (FOTO)  Lumbar Spine - 44% (56% limitation); predicted 54% (46% limitation)     ROM / Strength   AROM / PROM / Strength AROM;Strength     AROM   AROM Assessment Site Lumbar   Lumbar Flexion hands to upper shins (tight, but no increased pain)   Lumbar Extension 40% (increased pain)   Lumbar - Right Side Bend hand to just above knee (increased pain)   Lumbar - Left Side Bend hand to knee (no change in pain)   Lumbar - Right Rotation 80% (no change in pain)   Lumbar - Left Rotation 80% (no change in pain)     Strength   Strength Assessment Site Hip;Knee   Right/Left Hip Right;Left   Right Hip Flexion 3+/5   Right Hip Extension 3/5   Right Hip ABduction 3+/5   Right Hip ADduction 4-/5   Left Hip Flexion 3+/5   Left Hip Extension 3/5   Left Hip ABduction 4-/5   Left Hip ADduction 3+/5   Right/Left Knee Right;Left   Right Knee Flexion 4/5   Right Knee Extension 4-/5   Left Knee Flexion 4-/5   Left Knee Extension 4-/5     Flexibility   Soft Tissue Assessment /Muscle Length yes   Hamstrings moderately tight L>R   Quadriceps RF & hip flexors severly tight on R   ITB  mildy tight B   Piriformis R severely tight; L moderately tight                 PT Education - 12/02/15 1445    Education provided Yes   Education Details PT eval findings, POC and introduction to abdominal bracing/pelvic stabilization   Person(s) Educated Patient   Methods Explanation;Demonstration;Handout   Comprehension Verbalized understanding;Returned demonstration;Need  further instruction          PT Short Term Goals - 12/02/15 1450      PT SHORT TERM GOAL #1   Title Independent with initial HEP by 12/19/15   Status New           PT Long Term Goals - 12/02/15 1450      PT LONG TERM GOAL #1   Title Independent with advanced HEP by 01/30/16   Status New     PT LONG TERM GOAL #2   Title Lumbar flexibility WFL w/o increased pain by 01/30/16   Status New     PT LONG TERM GOAL #3   Title B hip and knee strength >/= 4/5 for improved stability by 01/30/16   Status New     PT LONG TERM GOAL #4   Title Pt will report ability to complete daily chores and job tasks with low back pain and/or radicular pain </= 7/10 by 01/30/16   Status New               Plan - 12/02/15 1450    Clinical Impression Statement Chinyere is a 55 y/o female who presents to OP PT with longstanding h/o LBP with atypical radiculopathy R > L dating back to a HNP in 1996. Pt reports she had previously tried therapy and other interventions w/o relief and therefore underwent L5-S1 spinal fusion on 09/24/14 which gave her some relief but recently noting pain has been worsening with radiculopathy now more prominent on L than R. Pain at time of eval was 6/10; but reports least pain 5/10, average pain at 6-7/10 and worst up to 9/10 which she describes as burning pain in mid low back. Radicular pain follows an atypical pattern which is described as a stabbing pain in calves, L>R, but denies and pain, numbness or tingling in upper leg or below calves. Pt's chief complaint of pain is with prolonged  positioning in any position (lying, sitting or standing/walking) and with lifting during daily chores around the home. Assessment revealed restricted lumbar ROM most notable in flexion, extension and R sidebending; with pain increased with extension and R sidebending. Flexibility assessment reveals moderate proximal LE tightness in B HS (L>R) and L piriformis, with severe tightness noted in R hip flexors/RF and piriformis. Proximal LE strength assessment reveals mild to moderate weakness in B hips (refer to above MMT). POC will focus on improving LE soft tissue pliability along with core/proximal stability training, postural training with emphasis on neutral spine alignment including proper lifting mechanics, LE strengthening and manual therapy/modalities PRN for pain. May consider mechanical traction if radicular symptoms persist and/or TDN for increased muscle tension.   Rehab Potential Good   Clinical Impairments Affecting Rehab Potential L knee pain, RTC tendinopathy   PT Frequency 2x / week   PT Duration 8 weeks   PT Treatment/Interventions Patient/family education;Neuromuscular re-education;Therapeutic exercise;Manual techniques;Dry needling;Taping;Therapeutic activities;Balance training;Electrical Stimulation;Moist Heat;Traction;ADLs/Self Care Home Management   PT Next Visit Plan Create initial HEP for proximal LE flexibility and lumbar stabilization; Manual therapy to promote muscle relaxation and increased flexibility; Modalities PRN for pain   Consulted and Agree with Plan of Care Patient      Patient will benefit from skilled therapeutic intervention in order to improve the following deficits and impairments:  Pain, Impaired flexibility, Increased muscle spasms, Decreased range of motion, Decreased strength, Postural dysfunction, Improper body mechanics, Difficulty walking  Visit Diagnosis: Midline low back pain with sciatica, sciatica laterality unspecified  Difficulty  in walking, not  elsewhere classified  Muscle weakness (generalized)     Problem List Patient Active Problem List   Diagnosis Date Noted  . Hypokalemia 02/09/2015  . Encounter for preventative adult health care exam with abnormal findings 02/09/2015  . Preventative health care 02/09/2015  . Spondylolisthesis of lumbar region 09/24/2014  . Neck pain 04/08/2014  . Muscle cramps 01/13/2014  . Tendinopathy of rotator cuff 11/28/2013  . Essential hypertension, benign 11/28/2013  . Urinary frequency 08/17/2013  . Uterine fibroid 12/17/2012  . Obesity   . SVT (supraventricular tachycardia) ([place]) 09/21/2011  . LOW BACK PAIN 01/04/2008  . Hyperlipidemia 02/28/2007  . ALLERGIC RHINITIS 02/28/2007  . KNEE PAIN, LEFT 02/28/2007    Percival Spanish, PT, MPT 12/02/2015, 7:49 PM  [facility] [address]  [place], [place], [address] Phone: [phone]   Fax:  [phone]  Name: Sherri Sullivan MRN: [id number] Date of Birth: March 09, 1961

## 2015-12-02 NOTE — Patient Instructions (Signed)
PELVIC STABILIZATION: Pelvic Tilt (Lying)    Exhaling, pull belly toward spine, tilting pelvis forward. Inhaling, release. Repeat _10x5 sec__ times. Do _2__ times per day.  Copyright  VHI. All rights reserved.

## 2015-12-08 MED FILL — PREMARIN 0.9 MG TABLET: 0.9 | 21 days supply | Qty: 21 | Fill #1

## 2015-12-17 ENCOUNTER — Ambulatory Visit: Payer: 59 | Admitting: Physical Therapy

## 2015-12-17 DIAGNOSIS — R262 Difficulty in walking, not elsewhere classified: Secondary | ICD-10-CM

## 2015-12-17 DIAGNOSIS — M5440 Lumbago with sciatica, unspecified side: Secondary | ICD-10-CM | POA: Diagnosis not present

## 2015-12-17 DIAGNOSIS — M6281 Muscle weakness (generalized): Secondary | ICD-10-CM

## 2015-12-17 NOTE — Therapy (Signed)
[facility] [address]  [place], [place], [address] Phone: [phone]   Fax:  [phone]  Physical Therapy Treatment  Patient Details  Name: MILIRA HELMAN MRN: [id number] Date of Birth: 1960/11/05 Referring Provider: Marchia Meiers. Vertell Limber, MD  Encounter Date: 12/17/2015      PT End of Session - 12/17/15 0932    Visit Number 2   Number of Visits 16   Date for PT Re-Evaluation 01/30/16   PT Start Time 0932   PT Stop Time 1017   PT Time Calculation (min) 45 min   Activity Tolerance Patient tolerated treatment well   Behavior During Therapy [facility] for tasks assessed/performed      Past Medical History:  Diagnosis Date  . Anginal pain ([place])   . 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 Chicken pox as a child  . Dizziness   . Dysrhythmia 2013   SVT- treated by ablation by Dr. Lovena Le to f/u with as needed basis   . Encounter for preventative adult health care exam with abnormal findings 02/09/2015  . HTN (hypertension)   . Hypokalemia 02/09/2015  . Measles as a child  . Measles as a child  . Obesity   . Panic attack    during episode of feeling to crowded   . Preventative health care 02/09/2015  . SOB (shortness of breath) 09/23/11   "a little bit; at rest; before ablation", SOB again now (08/2014- due to lack  of exercise)   . SVT (supraventricular tachycardia) ([place])   . Uterine fibroid 12/17/2012   Cervical polyp per patient Follows with [facility], Dr Evette Cristal    Past Surgical History:  Procedure Laterality Date  . ABDOMINAL HYSTERECTOMY  1990's  . CARDIAC ELECTROPHYSIOLOGY STUDY AND ABLATION  09/23/11  . MAXIMUM ACCESS (MAS)POSTERIOR LUMBAR INTERBODY FUSION (PLIF) 1 LEVEL N/A 09/24/2014   Procedure: L5-S1 MAS PLIF ;  Surgeon: Erline Levine, MD;  Location: [place] NEURO ORS;  Service: Neurosurgery;  Laterality: N/A;  L5-S1 MAS PLIF fusion  . SUPRAVENTRICULAR  TACHYCARDIA ABLATION N/A 09/23/2011   Procedure: SUPRAVENTRICULAR TACHYCARDIA ABLATION;  Surgeon: Evans Lance, MD;  Location: [facility] CATH LAB;  Service: Cardiovascular;  Laterality: N/A;  . TUBAL LIGATION  1980's    There were no vitals filed for this visit.      Subjective Assessment - 12/17/15 0933    Subjective (P)  Pt returning for 1st time since eval 15 days ago due to scheduling issues. Pt reports it has been a bad week at work, lifting boxes in prep for upcoming holidays, and states pain has been worse.   Patient Stated Goals "Get through daily chores w/o being above an 8/10 pain"   Currently in Pain? Yes   Pain Score 7    Pain Location Back   Pain Orientation Mid;Lower   Pain Descriptors / Indicators Burning          Today's Treatment  TherEx B HS + Gastroc stretch with strap 2x30" B ITB stretch with strap 2x30" B KTOS piriformis stretch 2x30" B SKTC stretch 2x30" B Mod thomas hip flexor stretch 2x30" Pelvic tilt/abdominal bracing 10x5" TrA + Hooklying knee fall-out 10x3" TrA + Hooklying march 10x3"           PT Education - 12/17/15 1017    Education provided Yes   Education Details Initial HEP   Person(s) Educated Patient  Methods Explanation;Demonstration;Handout;Verbal cues;Tactile cues   Comprehension Verbalized understanding;Returned demonstration;Verbal cues required;Tactile cues required;Need further instruction          PT Short Term Goals - 12/17/15 [id number]      PT SHORT TERM GOAL #1   Title Independent with initial HEP by 12/19/15   Status On-going  12/17/15 - pt returning for 1st time since eval today           PT Long Term Goals - 12/17/15 0953      PT LONG TERM GOAL #1   Title Independent with advanced HEP by 01/30/16   Status On-going     PT LONG TERM GOAL #2   Title Lumbar flexibility WFL w/o increased pain by 01/30/16   Status On-going     PT LONG TERM GOAL #3   Title B hip and knee strength >/= 4/5 for improved stability by  01/30/16   Status On-going     PT LONG TERM GOAL #4   Title Pt will report ability to complete daily chores and job tasks with low back pain and/or radicular pain </= 7/10 by 01/30/16   Status On-going               Plan - 12/17/15 1017    Clinical Impression Statement Pt returning after 2 weeks from eval due to scheduling issues. Reporting difficulty understanding abdominal bracing/pelvic tilt after initial instruction, therefore reviewed this along with providing HEP instruction in proximal LE stretching program and added basic lumbar stabilization exercises with pt able to tolerate initial HEP performance well.   Rehab Potential Good   Clinical Impairments Affecting Rehab Potential L knee pain, RTC tendinopathy   PT Frequency 2x / week   PT Duration 8 weeks   PT Treatment/Interventions Patient/family education;Neuromuscular re-education;Therapeutic exercise;Manual techniques;Dry needling;Taping;Therapeutic activities;Balance training;Electrical Stimulation;Moist Heat;Traction;ADLs/Self Care Home Management   PT Next Visit Plan Review initial HEP; Continue focus on proximal LE flexibility and lumbar stabilization; Manual therapy to promote muscle relaxation and increased flexibility; Modalities PRN for pain   Consulted and Agree with Plan of Care Patient      Patient will benefit from skilled therapeutic intervention in order to improve the following deficits and impairments:  Pain, Impaired flexibility, Increased muscle spasms, Decreased range of motion, Decreased strength, Postural dysfunction, Improper body mechanics, Difficulty walking  Visit Diagnosis: Midline low back pain with sciatica, sciatica laterality unspecified  Difficulty in walking, not elsewhere classified  Muscle weakness (generalized)     Problem List Patient Active Problem List   Diagnosis Date Noted  . Hypokalemia 02/09/2015  . Encounter for preventative adult health care exam with abnormal findings  02/09/2015  . Preventative health care 02/09/2015  . Spondylolisthesis of lumbar region 09/24/2014  . Neck pain 04/08/2014  . Muscle cramps 01/13/2014  . Tendinopathy of rotator cuff 11/28/2013  . Essential hypertension, benign 11/28/2013  . Urinary frequency 08/17/2013  . Uterine fibroid 12/17/2012  . Obesity   . SVT (supraventricular tachycardia) ([place]) 09/21/2011  . LOW BACK PAIN 01/04/2008  . Hyperlipidemia 02/28/2007  . ALLERGIC RHINITIS 02/28/2007  . KNEE PAIN, LEFT 02/28/2007    Percival Spanish, PT, MPT 12/17/2015, 10:41 AM  [facility] [address]  [place], [place], [address] Phone: [phone]   Fax:  [phone]  Name: AKELA SCHILLING MRN: [id number] Date of Birth: [birth year]

## 2015-12-19 ENCOUNTER — Encounter: Payer: 59 | Admitting: Physical Therapy

## 2015-12-22 MED FILL — TRIAMTERENE-HCTZ 37.5-25 MG: 37.5-25 | 90 days supply | Qty: 90 | Fill #1

## 2015-12-24 ENCOUNTER — Ambulatory Visit: Payer: 59 | Admitting: Physical Therapy

## 2015-12-25 MED FILL — POTASSIUM CL ER 20 MEQ TABL: 20 | 90 days supply | Qty: 180 | Fill #0

## 2015-12-26 ENCOUNTER — Ambulatory Visit: Payer: 59 | Admitting: Physical Therapy

## 2015-12-26 DIAGNOSIS — R262 Difficulty in walking, not elsewhere classified: Secondary | ICD-10-CM | POA: Diagnosis not present

## 2015-12-26 DIAGNOSIS — M5440 Lumbago with sciatica, unspecified side: Secondary | ICD-10-CM | POA: Diagnosis not present

## 2015-12-26 DIAGNOSIS — M6281 Muscle weakness (generalized): Secondary | ICD-10-CM

## 2015-12-26 NOTE — Therapy (Signed)
[facility] [address]  [place], [place], [address] Phone: [phone]   Fax:  [phone]  Physical Therapy Treatment  Patient Details  Name: Sherri Sullivan MRN: [id number] Date of Birth: Jun 14, 1960 Referring Provider: Marchia Meiers. Vertell Limber, MD  Encounter Date: 12/26/2015      PT End of Session - 12/26/15 0933    Visit Number 3   Number of Visits 16   Date for PT Re-Evaluation 01/30/16   PT Start Time 0933  Pt adjusting schedule with receptionist   PT Stop Time 1032   PT Time Calculation (min) 59 min   Activity Tolerance Patient tolerated treatment well   Behavior During Therapy [facility] for tasks assessed/performed      Past Medical History:  Diagnosis Date  . Anginal pain ([place])   . 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 Chicken pox as a child  . Dizziness   . Dysrhythmia 2013   SVT- treated by ablation by Dr. Lovena Le to f/u with as needed basis   . Encounter for preventative adult health care exam with abnormal findings 02/09/2015  . HTN (hypertension)   . Hypokalemia 02/09/2015  . Measles as a child  . Measles as a child  . Obesity   . Panic attack    during episode of feeling to crowded   . Preventative health care 02/09/2015  . SOB (shortness of breath) 09/23/11   "a little bit; at rest; before ablation", SOB again now (08/2014- due to lack  of exercise)   . SVT (supraventricular tachycardia) ([place])   . Uterine fibroid 12/17/2012   Cervical polyp per patient Follows with [facility], Dr Evette Cristal    Past Surgical History:  Procedure Laterality Date  . ABDOMINAL HYSTERECTOMY  1990's  . CARDIAC ELECTROPHYSIOLOGY STUDY AND ABLATION  09/23/11  . MAXIMUM ACCESS (MAS)POSTERIOR LUMBAR INTERBODY FUSION (PLIF) 1 LEVEL N/A 09/24/2014   Procedure: L5-S1 MAS PLIF ;  Surgeon: Erline Levine, MD;  Location: [place] NEURO ORS;  Service: Neurosurgery;  Laterality: N/A;   L5-S1 MAS PLIF fusion  . SUPRAVENTRICULAR TACHYCARDIA ABLATION N/A 09/23/2011   Procedure: SUPRAVENTRICULAR TACHYCARDIA ABLATION;  Surgeon: Evans Lance, MD;  [facility];  Service: Cardiovascular;  Laterality: N/A;  . TUBAL LIGATION  1980's    There were no vitals filed for this visit.      Subjective Assessment - 12/26/15 0935    Subjective Pt reporting back "pretty much stays the same".   Patient Stated Goals "Get through daily chores w/o being above an 8/10 pain"   Currently in Pain? Yes   Pain Score --  5-6/10   Pain Location Back   Pain Orientation Mid;Lower   Pain Descriptors / Indicators Burning          Today's Treatment  TherEx NuStep - lvl 3 x 5'  Manual B HS + Gastroc stretch x30" B ITB stretch x30" STM, DTM & IASTM with 4" ball to R piriformis in L sidelying with lower leg on bolster STM & MFR to R ITB in L sidelying with lower leg on bolster STM & MFR to R Gastroc in prone  Kinesiotape R Gastroc - 3 strips 30% - 1 strip posterior/inferior heel along Achilles to mid/upper calf, 2 strips med & lat posterior/inferior heel crossing Achilles and extending to opposite lat & med gastroc R Piriformis - 2 "I" strips - 30% along muscle +  50% perpendicular at area of greatest tenderness  Mechanical Traction Lumbar Spine - Hooklying with neutral pull, 50#/25#, 60"/20" x10'          PT Short Term Goals - 12/17/15 [id number]      PT SHORT TERM GOAL #1   Title Independent with initial HEP by 12/19/15   Status On-going  12/17/15 - pt returning for 1st time since eval today           PT Long Term Goals - 12/17/15 0953      PT LONG TERM GOAL #1   Title Independent with advanced HEP by 01/30/16   Status On-going     PT LONG TERM GOAL #2   Title Lumbar flexibility WFL w/o increased pain by 01/30/16   Status On-going     PT LONG TERM GOAL #3   Title B hip and knee strength >/= 4/5 for improved stability by 01/30/16   Status On-going     PT LONG  TERM GOAL #4   Title Pt will report ability to complete daily chores and job tasks with low back pain and/or radicular pain </= 7/10 by 01/30/16   Status On-going               Plan - 12/26/15 0937    Clinical Impression Statement Pt reporting good compliance with HEP and no issues with HEP other than difficulty with KTOS piriformis stretch. Initiated manual review of stretches today but pt having difficulty relaxing, therefore shifted focus to manual STM to R piriformis and gastroc followed by kinesiotaping to both. Completed session with initial trial of lumabr traction with pt noting some improved muscle relaxation after treatment.   Rehab Potential Good   Clinical Impairments Affecting Rehab Potential L knee pain, RTC tendinopathy   PT Treatment/Interventions Patient/family education;Neuromuscular re-education;Therapeutic exercise;Manual techniques;Dry needling;Taping;Therapeutic activities;Balance training;Electrical Stimulation;Moist Heat;Traction;ADLs/Self Care Home Management   PT Next Visit Plan Assess response to traction & taping; Review initial HEP; Continue focus on proximal LE flexibility and lumbar stabilization; Manual therapy to promote muscle relaxation and increased flexibility; Modalities PRN for pain   Consulted and Agree with Plan of Care Patient      Patient will benefit from skilled therapeutic intervention in order to improve the following deficits and impairments:  Pain, Impaired flexibility, Increased muscle spasms, Decreased range of motion, Decreased strength, Postural dysfunction, Improper body mechanics, Difficulty walking  Visit Diagnosis: Midline low back pain with sciatica, sciatica laterality unspecified  Difficulty in walking, not elsewhere classified  Muscle weakness (generalized)     Problem List Patient Active Problem List   Diagnosis Date Noted  . Hypokalemia 02/09/2015  . Encounter for preventative adult health care exam with abnormal  findings 02/09/2015  . Preventative health care 02/09/2015  . Spondylolisthesis of lumbar region 09/24/2014  . Neck pain 04/08/2014  . Muscle cramps 01/13/2014  . Tendinopathy of rotator cuff 11/28/2013  . Essential hypertension, benign 11/28/2013  . Urinary frequency 08/17/2013  . Uterine fibroid 12/17/2012  . Obesity   . SVT (supraventricular tachycardia) ([place]) 09/21/2011  . LOW BACK PAIN 01/04/2008  . Hyperlipidemia 02/28/2007  . ALLERGIC RHINITIS 02/28/2007  . KNEE PAIN, LEFT 02/28/2007    Percival Spanish, PT, MPT 12/26/2015, 10:37 AM  [facility] [address]  [place], [place], [address] Phone: [phone]   Fax:  [phone]  Name: Sherri Sullivan MRN: [id number] Date of Birth: March 13, 1961

## 2015-12-29 ENCOUNTER — Ambulatory Visit: Payer: 59 | Attending: Neurosurgery | Admitting: Physical Therapy

## 2015-12-29 DIAGNOSIS — M5441 Lumbago with sciatica, right side: Secondary | ICD-10-CM | POA: Insufficient documentation

## 2015-12-29 DIAGNOSIS — M6281 Muscle weakness (generalized): Secondary | ICD-10-CM | POA: Diagnosis not present

## 2015-12-29 DIAGNOSIS — R262 Difficulty in walking, not elsewhere classified: Secondary | ICD-10-CM | POA: Diagnosis not present

## 2015-12-29 DIAGNOSIS — M5442 Lumbago with sciatica, left side: Secondary | ICD-10-CM | POA: Insufficient documentation

## 2015-12-29 DIAGNOSIS — G8929 Other chronic pain: Secondary | ICD-10-CM | POA: Insufficient documentation

## 2015-12-29 NOTE — Therapy (Signed)
[facility] [address]  [place], [place], [address] Phone: [phone]   Fax:  [phone]  Physical Therapy Treatment  Patient Details  Name: Sherri Sullivan MRN: [id number] Date of Birth: [birth year] Referring Provider: Marchia Meiers. Vertell Limber, MD  Encounter Date: 12/29/2015      PT End of Session - 12/29/15 1619    Visit Number 4   Number of Visits 16   Date for PT Re-Evaluation 01/30/16   PT Start Time 1619   PT Stop Time [id number]   PT Time Calculation (min) 56 min   Activity Tolerance Patient tolerated treatment well   Behavior During Therapy [facility] for tasks assessed/performed      Past Medical History:  Diagnosis Date  . Anginal pain ([place])   . 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 Chicken pox as a child  . Dizziness   . Dysrhythmia 2013   SVT- treated by ablation by Dr. Lovena Le to f/u with as needed basis   . Encounter for preventative adult health care exam with abnormal findings 02/09/2015  . HTN (hypertension)   . Hypokalemia 02/09/2015  . Measles as a child  . Measles as a child  . Obesity   . Panic attack    during episode of feeling to crowded   . Preventative health care 02/09/2015  . SOB (shortness of breath) 09/23/11   "a little bit; at rest; before ablation", SOB again now (08/2014- due to lack  of exercise)   . SVT (supraventricular tachycardia) ([place])   . Uterine fibroid 12/17/2012   Cervical polyp per patient Follows with [facility], Dr Evette Cristal    Past Surgical History:  Procedure Laterality Date  . ABDOMINAL HYSTERECTOMY  1990's  . CARDIAC ELECTROPHYSIOLOGY STUDY AND ABLATION  09/23/11  . MAXIMUM ACCESS (MAS)POSTERIOR LUMBAR INTERBODY FUSION (PLIF) 1 LEVEL N/A 09/24/2014   Procedure: L5-S1 MAS PLIF ;  Surgeon: Erline Levine, MD;  Location: [place] NEURO ORS;  Service: Neurosurgery;  Laterality: N/A;  L5-S1 MAS PLIF fusion  . SUPRAVENTRICULAR  TACHYCARDIA ABLATION N/A 09/23/2011   Procedure: SUPRAVENTRICULAR TACHYCARDIA ABLATION;  Surgeon: Evans Lance, MD;  Location: [facility] C[facility] LAB;  Service: Cardiovascular;  Laterality: N/A;  . TUBAL LIGATION  1980's    There were no vitals filed for this visit.      Subjective Assessment - 12/29/15 1619    Subjective Pt reports she feels like taping has helped but not sure if the traction made a difference or not but would like to try it again. Taping remains in place.   Patient Stated Goals "Get through daily chores w/o being above an 8/10 pain"   Currently in Pain? Yes   Pain Score 5    Pain Location Back           Today's Treatment  TherEx NuStep - lvl 5 x 5' B HS + Gastroc stretch with strap x30" B ITB stretch with strap x30" B KTOS piriformis stretch x30" B SKTC stretch x30" B Mod thomas hip flexor stretch x30" Pelvic tilt/abdominal bracing 10x5" TrA + Hooklying knee fall-out 10x3" TrA + Hooklying march 10x3" TrA + Alt Hip ABD/ER with green TB 10x3" TrA + Hooklying march with green TB 10x3" Bridge + Hip ABD isometric with green TB attempted but deferred d/t increased pain  Mechanical Traction Lumbar Spine - Hooklying with neutral pull, 60#/35#, 60"/20" x10'  PT Short Term Goals - 12/29/15 1658      PT SHORT TERM GOAL #1   Title Independent with initial HEP by 12/19/15   Status Achieved           PT Long Term Goals - 12/17/15 0953      PT LONG TERM GOAL #1   Title Independent with advanced HEP by 01/30/16   Status On-going     PT LONG TERM GOAL #2   Title Lumbar flexibility WFL w/o increased pain by 01/30/16   Status On-going     PT LONG TERM GOAL #3   Title B hip and knee strength >/= 4/5 for improved stability by 01/30/16   Status On-going     PT LONG TERM GOAL #4   Title Pt will report ability to complete daily chores and job tasks with low back pain and/or radicular pain </= 7/10 by 01/30/16   Status On-going                Plan - 12/29/15 1625    Clinical Impression Statement Pt reporting completing HEP daily at night and able to perform good return demonstration of all but the last 2 exercises (pt thinks she missed this page when reviewing exercises initially at home and has been completing exercises from memory more recently). Introduced green TB resistance with lumbar stabilization exercises with good tolerance, but deferred addition of band to HEP given limited opportunity to practice basic exercises. Pt noting benefit from taping with tape remaining in place today.   Rehab Potential Good   Clinical Impairments Affecting Rehab Potential L knee pain, RTC tendinopathy   PT Treatment/Interventions Patient/family education;Neuromuscular re-education;Therapeutic exercise;Manual techniques;Dry needling;Taping;Therapeutic activities;Balance training;Electrical Stimulation;Moist Heat;Traction;ADLs/Self Care Home Management   PT Next Visit Plan Review initial HEP; Continue focus on proximal LE flexibility and lumbar stabilization; Manual therapy to promote muscle relaxation and increased flexibility; Modalities PRN for pain   Consulted and Agree with Plan of Care Patient      Patient will benefit from skilled therapeutic intervention in order to improve the following deficits and impairments:  Pain, Impaired flexibility, Increased muscle spasms, Decreased range of motion, Decreased strength, Postural dysfunction, Improper body mechanics, Difficulty walking  Visit Diagnosis: Chronic midline low back pain with bilateral sciatica  Difficulty in walking, not elsewhere classified  Muscle weakness (generalized)     Problem List Patient Active Problem List   Diagnosis Date Noted  . Hypokalemia 02/09/2015  . Encounter for preventative adult health care exam with abnormal findings 02/09/2015  . Preventative health care 02/09/2015  . Spondylolisthesis of lumbar region 09/24/2014  . Neck pain 04/08/2014  . Muscle  cramps 01/13/2014  . Tendinopathy of rotator cuff 11/28/2013  . Essential hypertension, benign 11/28/2013  . Urinary frequency 08/17/2013  . Uterine fibroid 12/17/2012  . Obesity   . SVT (supraventricular tachycardia) ([place]) 09/21/2011  . LOW BACK PAIN 01/04/2008  . Hyperlipidemia 02/28/2007  . ALLERGIC RHINITIS 02/28/2007  . KNEE PAIN, LEFT 02/28/2007    Percival Spanish, PT,MPT 12/29/2015, 6:17 PM  [facility] [address]  [place], [place], [address] Phone: [phone]   Fax:  [phone]  Name: Sherri Sullivan MRN: [id number] Date of Birth: Aug 11, 1960

## 2016-01-01 ENCOUNTER — Ambulatory Visit: Payer: 59 | Admitting: Physical Therapy

## 2016-01-01 DIAGNOSIS — M5442 Lumbago with sciatica, left side: Principal | ICD-10-CM

## 2016-01-01 DIAGNOSIS — R262 Difficulty in walking, not elsewhere classified: Secondary | ICD-10-CM | POA: Diagnosis not present

## 2016-01-01 DIAGNOSIS — M6281 Muscle weakness (generalized): Secondary | ICD-10-CM

## 2016-01-01 DIAGNOSIS — M5441 Lumbago with sciatica, right side: Principal | ICD-10-CM

## 2016-01-01 DIAGNOSIS — G8929 Other chronic pain: Secondary | ICD-10-CM | POA: Diagnosis not present

## 2016-01-01 NOTE — Patient Instructions (Addendum)
TENS UNIT  This is helpful for muscle pain and spasm.   Search and Purchase a TENS 7000 2nd edition at www.tenspros.com or www.amazon.com  (It should be less than $30)     TENS unit instructions:   Do not shower or bathe with the unit on  Turn the unit off before removing electrodes or batteries  If the electrodes lose stickiness add a drop of water to the electrodes after they are disconnected from the unit and place on plastic sheet. If you continued to have difficulty, call the TENS unit company to purchase more electrodes.  Do not apply lotion on the skin area prior to use. Make sure the skin is clean and dry as this will help prolong the life of the electrodes.  After use, always check skin for unusual red areas, rash or other skin difficulties. If there are any skin problems, does not apply electrodes to the same area.  Never remove the electrodes from the unit by pulling the wires.  Do not use the TENS unit or electrodes other than as directed.  Do not change electrode placement without consulting your therapist or physician.  Keep 2 fingers with between each electrode.   TENS stands for Transcutaneous Electrical Nerve Stimulation. In other words, electrical impulses are allowed to pass through the skin in order to excite a nerve.   Purpose and Use of TENS:  TENS is a method used to manage acute and chronic pain without the use of drugs. It has been effective in managing pain associated with surgery, sprains, strains, trauma, rheumatoid arthritis, and neuralgias. It is a non-addictive, low risk, and non-invasive technique used to control pain. It is not, by any means, a curative form of treatment.   How TENS Works:  Most TENS units are a Paramedic unit powered by one 9 volt battery. Attached to the outside of the unit are two lead wires where two pins and/or snaps connect on each wire. All units come with a set of four reusable pads or electrodes. These are placed  on the skin surrounding the area involved. By inserting the leads into  the pads, the electricity can pass from the unit making the circuit complete.  As the intensity is turned up slowly, the electrical current enters the body from the electrodes through the skin to the surrounding nerve fibers. This triggers the release of hormones from within the body. These hormones contain pain relievers. By increasing the circulation of these hormones, the person's pain may be lessened. It is also believed that the electrical stimulation itself helps to block the pain messages being sent to the brain, thus also decreasing the body's perception of pain.   Hazards:  TENS units are NOT to be used by patients with PACEMAKERS, DEFIBRILLATORS, DIABETIC PUMPS, PREGNANT WOMEN, and patients with SEIZURE DISORDERS.  TENS units are NOT to be used over the heart, throat, brain, or spinal cord.  One of the major side effects from the TENS unit may be skin irritation. Some people may develop a rash if they are sensitive to the materials used in the electrodes or the connecting wires.   Wear the unit for low back.   Avoid overuse due the body getting used to the stem making it not as effective over time.

## 2016-01-01 NOTE — Therapy (Signed)
[facility] [address]  [place], [place], [address] Phone: [phone]   Fax:  [phone]  Physical Therapy Treatment  Patient Details  Name: Sherri Sullivan MRN: [id number] Date of Birth: Oct 30, 1960 Referring Provider: Marchia Meiers. Vertell Limber, MD  Encounter Date: 01/01/2016      PT End of Session - 01/01/16 1705    Visit Number 5   Number of Visits 16   Date for PT Re-Evaluation 01/30/16   PT Start Time 1705  Pt arrived late   PT Stop Time 1802   PT Time Calculation (min) 57 min   Activity Tolerance Patient tolerated treatment well   Behavior During Therapy [facility] for tasks assessed/performed      Past Medical History:  Diagnosis Date  . Anginal pain ([place])   . 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 Chicken pox as a child  . Dizziness   . Dysrhythmia 2013   SVT- treated by ablation by Dr. Lovena Le to f/u with as needed basis   . Encounter for preventative adult health care exam with abnormal findings 02/09/2015  . HTN (hypertension)   . Hypokalemia 02/09/2015  . Measles as a child  . Measles as a child  . Obesity   . Panic attack    during episode of feeling to crowded   . Preventative health care 02/09/2015  . SOB (shortness of breath) 09/23/11   "a little bit; at rest; before ablation", SOB again now (08/2014- due to lack  of exercise)   . SVT (supraventricular tachycardia) ([place])   . Uterine fibroid 12/17/2012   Cervical polyp per patient Follows with [facility], Dr Evette Cristal    Past Surgical History:  Procedure Laterality Date  . ABDOMINAL HYSTERECTOMY  1990's  . CARDIAC ELECTROPHYSIOLOGY STUDY AND ABLATION  09/23/11  . MAXIMUM ACCESS (MAS)POSTERIOR LUMBAR INTERBODY FUSION (PLIF) 1 LEVEL N/A 09/24/2014   Procedure: L5-S1 MAS PLIF ;  Surgeon: Erline Levine, MD;  Location: [place] NEURO ORS;  Service: Neurosurgery;  Laterality: N/A;  L5-S1 MAS PLIF fusion  .  SUPRAVENTRICULAR TACHYCARDIA ABLATION N/A 09/23/2011   Procedure: SUPRAVENTRICULAR TACHYCARDIA ABLATION;  Surgeon: Evans Lance, MD;  Location: [facility] CATH LAB;  Service: Cardiovascular;  Laterality: N/A;  . TUBAL LIGATION  1980's    There were no vitals filed for this visit.      Subjective Assessment - 01/01/16 1705    Subjective Pt reports no change in pain during traction last visit, but feels pain was worse when she sat up after treatment, so would like to hold off on traction for now. Pt states pain flared up on Tuesday night at ~2 am with pain changing from burnig to throbbing with new onset of numbness in both LE's Numbness resolved after walking around but throbbing pain has persisted.   Patient Stated Goals "Get through daily chores w/o being above an 8/10 pain"   Currently in Pain? Yes   Pain Score 8    Pain Location Back   Pain Orientation Mid;Lower   Pain Descriptors / Indicators Throbbing           Today's Treatment  TherEx NuStep - lvl 5 x 5' DKTC with heels on peanut ball 15x3"  Alt Quad set/Hip extension isometric into peanut ball 10x3' Seated 3 way prayer stretch with green (65 cm) 2x30" each Quadruped - Cat/Camel 10x5" Quadruped 3 way prayer stretch 1x30" each  Modalities IFC (80-150 Hz) to B lower lumbar paraspinals with pt in L sidelying, intensity to pt tolerance x15' Moist heat pack to low back in conjunction with estim          PT Short Term Goals - 12/29/15 1658      PT SHORT TERM GOAL #1   Title Independent with initial HEP by 12/19/15   Status Achieved           PT Long Term Goals - 12/17/15 0953      PT LONG TERM GOAL #1   Title Independent with advanced HEP by 01/30/16   Status On-going     PT LONG TERM GOAL #2   Title Lumbar flexibility WFL w/o increased pain by 01/30/16   Status On-going     PT LONG TERM GOAL #3   Title B hip and knee strength >/= 4/5 for improved stability by 01/30/16   Status On-going     PT LONG TERM  GOAL #4   Title Pt will report ability to complete daily chores and job tasks with low back pain and/or radicular pain </= 7/10 by 01/30/16   Status On-going               Plan - 01/01/16 1715    Clinical Impression Statement Pt feeling like pain worsens after getting up from traction, therefore will defer further traction at this time. Pain overall increased today with pt reporting new onset on B LE numbness in the middle of the night Tues which resolved after walking around some, but pain intensity remains elevated today. Limited tolerance for exercises but pt noting benef from prayer stretch, therefore added to HEP. Ended  session with trial of IFC estim and moist heat, with pt noting decreased pain after this, therefore provided info on home TENS unit.   Rehab Potential Good   Clinical Impairments Affecting Rehab Potential L knee pain, RTC tendinopathy   PT Treatment/Interventions Patient/family education;Neuromuscular re-education;Therapeutic exercise;Manual techniques;Dry needling;Taping;Therapeutic activities;Balance training;Electrical Stimulation;Moist Heat;Traction;ADLs/Self Care Home Management   PT Next Visit Plan Continue focus on proximal LE flexibility and lumbar stabilization as tolerated; Manual therapy to promote muscle relaxation and increased flexibility; Modalities PRN for pain   Consulted and Agree with Plan of Care Patient      Patient will benefit from skilled therapeutic intervention in order to improve the following deficits and impairments:  Pain, Impaired flexibility, Increased muscle spasms, Decreased range of motion, Decreased strength, Postural dysfunction, Improper body mechanics, Difficulty walking  Visit Diagnosis: Chronic midline low back pain with bilateral sciatica  Difficulty in walking, not elsewhere classified  Muscle weakness (generalized)     Problem List Patient Active Problem List   Diagnosis Date Noted  . Hypokalemia 02/09/2015  .  Encounter for preventative adult health care exam with abnormal findings 02/09/2015  . Preventative health care 02/09/2015  . Spondylolisthesis of lumbar region 09/24/2014  . Neck pain 04/08/2014  . Muscle cramps 01/13/2014  . Tendinopathy of rotator cuff 11/28/2013  . Essential hypertension, benign 11/28/2013  . Urinary frequency 08/17/2013  . Uterine fibroid 12/17/2012  . Obesity   . SVT (supraventricular tachycardia) ([place]) 09/21/2011  . LOW BACK PAIN 01/04/2008  . Hyperlipidemia 02/28/2007  . ALLERGIC RHINITIS 02/28/2007  . KNEE PAIN, LEFT 02/28/2007    Percival Spanish, PT, MPT 01/01/2016, 6:13 PM  [facility] [address]  [place], [place], [address] Phone: [phone]   Fax:  [phone]  Name:  MARCIE VANDRUNEN MRN: [id number] Date of Birth: 1961-03-06

## 2016-01-05 ENCOUNTER — Ambulatory Visit: Payer: 59 | Admitting: Physical Therapy

## 2016-01-05 DIAGNOSIS — M6281 Muscle weakness (generalized): Secondary | ICD-10-CM | POA: Diagnosis not present

## 2016-01-05 DIAGNOSIS — R262 Difficulty in walking, not elsewhere classified: Secondary | ICD-10-CM | POA: Diagnosis not present

## 2016-01-05 DIAGNOSIS — M5442 Lumbago with sciatica, left side: Principal | ICD-10-CM

## 2016-01-05 DIAGNOSIS — G8929 Other chronic pain: Secondary | ICD-10-CM | POA: Diagnosis not present

## 2016-01-05 DIAGNOSIS — M5441 Lumbago with sciatica, right side: Principal | ICD-10-CM

## 2016-01-05 NOTE — Therapy (Signed)
[facility] [address]  [place], [place], [address] Phone: [phone]   Fax:  [phone]  Physical Therapy Treatment  Patient Details  Name: Sherri Sullivan MRN: [id number] Date of Birth: 14-Sep-1960 Referring Provider: Marchia Meiers. Vertell Limber, MD  Encounter Date: 01/05/2016      PT End of Session - 01/05/16 1616    Visit Number 6   Number of Visits 16   Date for PT Re-Evaluation 01/30/16   PT Start Time [id number]   PT Stop Time 1700   PT Time Calculation (min) 44 min   Activity Tolerance Patient tolerated treatment well   Behavior During Therapy [facility] for tasks assessed/performed      Past Medical History:  Diagnosis Date  . Anginal pain ([place])   . 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 Chicken pox as a child  . Dizziness   . Dysrhythmia 2013   SVT- treated by ablation by Dr. Lovena Le to f/u with as needed basis   . Encounter for preventative adult health care exam with abnormal findings 02/09/2015  . HTN (hypertension)   . Hypokalemia 02/09/2015  . Measles as a child  . Measles as a child  . Obesity   . Panic attack    during episode of feeling to crowded   . Preventative health care 02/09/2015  . SOB (shortness of breath) 09/23/11   "a little bit; at rest; before ablation", SOB again now (08/2014- due to lack  of exercise)   . SVT (supraventricular tachycardia) ([place])   . Uterine fibroid 12/17/2012   Cervical polyp per patient Follows with [facility], Dr Evette Cristal    Past Surgical History:  Procedure Laterality Date  . ABDOMINAL HYSTERECTOMY  1990's  . CARDIAC ELECTROPHYSIOLOGY STUDY AND ABLATION  09/23/11  . MAXIMUM ACCESS (MAS)POSTERIOR LUMBAR INTERBODY FUSION (PLIF) 1 LEVEL N/A 09/24/2014   Procedure: L5-S1 MAS PLIF ;  Surgeon: Erline Levine, MD;  Location: [facility];  Service: Neurosurgery;  Laterality: N/A;  L5-S1 MAS PLIF fusion  . SUPRAVENTRICULAR  TACHYCARDIA ABLATION N/A 09/23/2011   Procedure: SUPRAVENTRICULAR TACHYCARDIA ABLATION;  Surgeon: Evans Lance, MD;  Location: [facility] CATH LAB;  Service: Cardiovascular;  Laterality: N/A;  . TUBAL LIGATION  1980's    There were no vitals filed for this visit.      Subjective Assessment - 01/05/16 1620    Subjective Pt reports pain significantly increased today after very busy weekend and notes return of numbness in B LE's last night. Pain was so severe today that she called out of work and had to take pain meds and wear her brace for the first time in a while.   Patient Stated Goals "Get through daily chores w/o being above an 8/10 pain"   Currently in Pain? Yes   Pain Score 6    Pain Location Back   Pain Orientation Lower;Mid          Today's Treatment  TherEx NuStep - lvl 4 x 5'  Manual B SKTC, HS, figure 4 and KTOS piriformis stretches x30" B Sciatic nerve glides x10 STM/DTM & IASTM with 4" ball to R piriformis in L sidelying  (pt noting decreased pain/tension with this)  TherEx DKTC with heels on peanut ball 10x3"  Alt Quad set/Hip extension isometric into peanut ball 10x3' Quadruped 3 way prayer stretch 1x30" each Quadruped - Cat/Camel 10x5"  PT Education - 01/05/16 1654    Education provided Yes   Education Details Self-release/STM for piriformis with ~4 inch ball   Person(s) Educated Patient   Methods Explanation;Demonstration   Comprehension Verbalized understanding          PT Short Term Goals - 12/29/15 1658      PT SHORT TERM GOAL #1   Title Independent with initial HEP by 12/19/15   Status Achieved           PT Long Term Goals - 12/17/15 0953      PT LONG TERM GOAL #1   Title Independent with advanced HEP by 01/30/16   Status On-going     PT LONG TERM GOAL #2   Title Lumbar flexibility WFL w/o increased pain by 01/30/16   Status On-going     PT LONG TERM GOAL #3   Title B hip and knee strength >/= 4/5 for improved stability  by 01/30/16   Status On-going     PT LONG TERM GOAL #4   Title Pt will report ability to complete daily chores and job tasks with low back pain and/or radicular pain </= 7/10 by 01/30/16   Status On-going               Plan - 01/05/16 1625    Clinical Impression Statement Pt reports pain flared up with return of numbness after a very busy weekend causing her to call out of work today. Increased tension/tightness noted in L hamstrings and R piriformis with limited tolerance for stretching d/t pain but pt noting benefit from [facility] ([facility]) with pain slightly improved by end of session. Pt reporting benefit from estim seemed short-lived with pain returning by the time she made it home from PT, therefore pt opted not to try this again today.   Rehab Potential Good   Clinical Impairments Affecting Rehab Potential L knee pain, RTC tendinopathy   PT Treatment/Interventions Patient/family education;Neuromuscular re-education;Therapeutic exercise;Manual techniques;Dry needling;Taping;Therapeutic activities;Balance training;Electrical Stimulation;Moist Heat;Traction;ADLs/Self Care Home Management   PT Next Visit Plan Continue focus on proximal LE flexibility and lumbar stabilization as tolerated; Manual therapy to promote muscle relaxation and increased flexibility; Modalities PRN for pain   Consulted and Agree with Plan of Care Patient      Patient will benefit from skilled therapeutic intervention in order to improve the following deficits and impairments:  Pain, Impaired flexibility, Increased muscle spasms, Decreased range of motion, Decreased strength, Postural dysfunction, Improper body mechanics, Difficulty walking  Visit Diagnosis: Chronic midline low back pain with bilateral sciatica  Difficulty in walking, not elsewhere classified  Muscle weakness (generalized)     Problem List Patient Active Problem List   Diagnosis Date Noted  . Hypokalemia 02/09/2015  . Encounter for preventative  adult health care exam with abnormal findings 02/09/2015  . Preventative health care 02/09/2015  . Spondylolisthesis of lumbar region 09/24/2014  . Neck pain 04/08/2014  . Muscle cramps 01/13/2014  . Tendinopathy of rotator cuff 11/28/2013  . Essential hypertension, benign 11/28/2013  . Urinary frequency 08/17/2013  . Uterine fibroid 12/17/2012  . Obesity   . SVT (supraventricular tachycardia) ([place]) 09/21/2011  . LOW BACK PAIN 01/04/2008  . Hyperlipidemia 02/28/2007  . ALLERGIC RHINITIS 02/28/2007  . KNEE PAIN, LEFT 02/28/2007    Percival Spanish, PT, MPT 01/05/2016, 5:54 PM  [facility] [address]  [place], [place], [address] Phone: [phone]   Fax:  [phone]  Name: VYVYAN BARTIE  MRN: [id number] Date of Birth: [birth year]

## 2016-01-06 MED FILL — PREMARIN 0.9 MG TABLET: 0.9 | 28 days supply | Qty: 21 | Fill #2

## 2016-01-08 ENCOUNTER — Ambulatory Visit: Payer: 59 | Admitting: Physical Therapy

## 2016-01-08 DIAGNOSIS — M6281 Muscle weakness (generalized): Secondary | ICD-10-CM | POA: Diagnosis not present

## 2016-01-08 DIAGNOSIS — M5441 Lumbago with sciatica, right side: Principal | ICD-10-CM

## 2016-01-08 DIAGNOSIS — M5442 Lumbago with sciatica, left side: Secondary | ICD-10-CM | POA: Diagnosis not present

## 2016-01-08 DIAGNOSIS — G8929 Other chronic pain: Secondary | ICD-10-CM | POA: Diagnosis not present

## 2016-01-08 DIAGNOSIS — R262 Difficulty in walking, not elsewhere classified: Secondary | ICD-10-CM

## 2016-01-08 NOTE — Therapy (Signed)
[facility] [address]  [place], [place], [address] Phone: [phone]   Fax:  [phone]  Physical Therapy Treatment  Patient Details  Name: Sherri Sullivan MRN: [id number] Date of Birth: 1960/07/24 Referring Provider: Marchia Meiers. Vertell Limber, MD  Encounter Date: 01/08/2016      PT End of Session - 01/08/16 1620    Visit Number 7   Number of Visits 16   Date for PT Re-Evaluation 01/30/16   PT Start Time 1620   PT Stop Time 1702   PT Time Calculation (min) 42 min   Activity Tolerance Patient tolerated treatment well   Behavior During Therapy [facility] for tasks assessed/performed      Past Medical History:  Diagnosis Date  . Anginal pain ([place])   . 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 Chicken pox as a child  . Dizziness   . Dysrhythmia 2013   SVT- treated by ablation by Dr. Lovena Le to f/u with as needed basis   . Encounter for preventative adult health care exam with abnormal findings 02/09/2015  . HTN (hypertension)   . Hypokalemia 02/09/2015  . Measles as a child  . Measles as a child  . Obesity   . Panic attack    during episode of feeling to crowded   . Preventative health care 02/09/2015  . SOB (shortness of breath) 09/23/11   "a little bit; at rest; before ablation", SOB again now (08/2014- due to lack  of exercise)   . SVT (supraventricular tachycardia) ([place])   . Uterine fibroid 12/17/2012   Cervical polyp per patient Follows with [facility], Dr Evette Cristal    Past Surgical History:  Procedure Laterality Date  . ABDOMINAL HYSTERECTOMY  1990's  . CARDIAC ELECTROPHYSIOLOGY STUDY AND ABLATION  09/23/11  . MAXIMUM ACCESS (MAS)POSTERIOR LUMBAR INTERBODY FUSION (PLIF) 1 LEVEL N/A 09/24/2014   Procedure: L5-S1 MAS PLIF ;  Surgeon: Erline Levine, MD;  Location: [facility]UR[facility];  Service: Neurosurgery;  Laterality: N/A;  L5-S1 MAS PLIF fusion  . SUPRAVENTRICULAR  TACHYCARDIA ABLATION N/A 09/23/2011   Procedure: SUPRAVENTRICULAR TACHYCARDIA ABLATION;  Surgeon: Evans Lance, MD;  Location: [facility] CATH LAB;  Service: Cardiovascular;  Laterality: N/A;  . TUBAL LIGATION  1980's    There were no vitals filed for this visit.      Subjective Assessment - 01/08/16 1629    Subjective Pt states she feels like she might need to learn to live with her pain as she feels that "pain is no worse, but no better". Does note that she does not feel as "wobbly".    Patient Stated Goals "Get through daily chores w/o being above an 8/10 pain"   Currently in Pain? Yes   Pain Score 6    Pain Location Back   Pain Orientation Lower;Mid            [facility] PT Assessment - 01/08/16 1620      AROM   Lumbar Flexion hands to lower shins (tight, but no increased pain)   Lumbar Extension 40% (increased pain)   Lumbar - Right Side Bend hand to knee (no change in pain)   Lumbar - Left Side Bend hand to knee (no change in pain)   Lumbar - Right Rotation 80% (increased pain)   Lumbar - Left Rotation WFL (no change in pain)     Strength   Right Hip Flexion 4-/5  Right Hip Extension 4-/5   Right Hip ABduction 4-/5   Right Hip ADduction 4-/5   Left Hip Flexion 4-/5   Left Hip Extension 3+/5   Left Hip ABduction 4-/5   Left Hip ADduction 4-/5   Right Knee Flexion 4/5   Right Knee Extension 4-/5   Left Knee Flexion 4/5   Left Knee Extension 4-/5          Today's Treatment  TherEx NuStep - lvl 5 x 5' Counter Partial Squat x10 Standing B 3 way Hip SLR x10 BATCA Low Row 15# x10  Manual Kinesiotaping - B lumbar paraspinals    2 parallel "I" strips 30% along paraspinal muscles from distal to SIJ extending to mid thoracic spine    50% star over R SIJ            PT Short Term Goals - 12/29/15 1658      PT SHORT TERM GOAL #1   Title Independent with initial HEP by 12/19/15   Status Achieved           PT Long Term Goals - 01/08/16 1632      PT LONG  TERM GOAL #1   Title Independent with advanced HEP by 01/30/16   Status On-going     PT LONG TERM GOAL #2   Title Lumbar flexibility WFL w/o increased pain by 01/30/16   Status Partially Met     PT LONG TERM GOAL #3   Title B hip and knee strength >/= 4/5 for improved stability by 01/30/16   Status On-going     PT LONG TERM GOAL #4   Title Pt will report ability to complete daily chores and job tasks with low back pain and/or radicular pain </= 7/10 by 01/30/16   Status Partially Met  Typically 6/10 currently, but still gets up to 8/10 at times.               Plan - 01/08/16 1702    Clinical Impression Statement Pt starting to feel frustrated with minimal improvment in pain and inability to find more than partial temporary relief from pain. Pt has demonstrated improvement in lumbar flexibility but persists with significant proximal LE tightness with hip ER remaining severely limited. Hip strength also slowly improving with strength now more symmetrical at grossly 4-/5 in hips and knees with L hip extension remaining weakest at 3+/5. Progressed therapeutic exercises to more upright hip strengthening while continuing to emphasize core activation and added mechanical rows to facilitate core activation and upper back strengthening. Given previous postive reponse to kinesiotaping, tried taping for B paraspinals with star over area of greatest pain at R SIJ. Pt due to f/u with MD on 01/19/16 and if no further improvement noted by that time will defer back to MD for further options.   Rehab Potential Good   Clinical Impairments Affecting Rehab Potential L knee pain, RTC tendinopathy   PT Treatment/Interventions Patient/family education;Neuromuscular re-education;Therapeutic exercise;Manual techniques;Dry needling;Taping;Therapeutic activities;Balance training;Electrical Stimulation;Moist Heat;Traction;ADLs/Self Care Home Management   PT Next Visit Plan Assess response to new taping pattern;  Continue focus on proximal LE flexibility and lumbar stabilization as tolerated; Manual therapy to promote muscle relaxation and increased flexibility; Modalities PRN for pain   Consulted and Agree with Plan of Care Patient      Patient will benefit from skilled therapeutic intervention in order to improve the following deficits and impairments:  Pain, Impaired flexibility, Increased muscle spasms, Decreased range of motion, Decreased strength, Postural dysfunction, Improper  body mechanics, Difficulty walking  Visit Diagnosis: Chronic midline low back pain with bilateral sciatica  Difficulty in walking, not elsewhere classified  Muscle weakness (generalized)     Problem List Patient Active Problem List   Diagnosis Date Noted  . Hypokalemia 02/09/2015  . Encounter for preventative adult health care exam with abnormal findings 02/09/2015  . Preventative health care 02/09/2015  . Spondylolisthesis of lumbar region 09/24/2014  . Neck pain 04/08/2014  . Muscle cramps 01/13/2014  . Tendinopathy of rotator cuff 11/28/2013  . Essential hypertension, benign 11/28/2013  . Urinary frequency 08/17/2013  . Uterine fibroid 12/17/2012  . Obesity   . SVT (supraventricular tachycardia) ([place]) 09/21/2011  . LOW BACK PAIN 01/04/2008  . Hyperlipidemia 02/28/2007  . ALLERGIC RHINITIS 02/28/2007  . KNEE PAIN, LEFT 02/28/2007    Percival Spanish, PT, MPT 01/08/2016, 5:26 PM  [facility] [address]  [place], [place], [address] Phone: [phone]   Fax:  [phone]  Name: Sherri Sullivan MRN: [id number] Date of Birth: 08-09-1960

## 2016-01-12 ENCOUNTER — Ambulatory Visit: Payer: 59 | Admitting: Physical Therapy

## 2016-01-12 DIAGNOSIS — M5442 Lumbago with sciatica, left side: Principal | ICD-10-CM

## 2016-01-12 DIAGNOSIS — M5441 Lumbago with sciatica, right side: Principal | ICD-10-CM

## 2016-01-12 DIAGNOSIS — G8929 Other chronic pain: Secondary | ICD-10-CM | POA: Diagnosis not present

## 2016-01-12 DIAGNOSIS — R262 Difficulty in walking, not elsewhere classified: Secondary | ICD-10-CM | POA: Diagnosis not present

## 2016-01-12 DIAGNOSIS — M6281 Muscle weakness (generalized): Secondary | ICD-10-CM | POA: Diagnosis not present

## 2016-01-12 NOTE — Patient Instructions (Signed)

## 2016-01-12 NOTE — Therapy (Signed)
[facility] [address]  [place], [place], [address] Phone: [phone]   Fax:  [phone]  Physical Therapy Treatment  Patient Details  Name: Sherri Sullivan MRN: [id number] Date of Birth: 06/15/1960 Referring Provider: Marchia Meiers. Vertell Limber, MD  Encounter Date: 01/12/2016      PT End of Session - 01/12/16 1622    Visit Number 8   Number of Visits 16   Date for PT Re-Evaluation 01/30/16   PT Start Time 1622  Pt arrived late   PT Stop Time 1704   PT Time Calculation (min) 42 min   Activity Tolerance Patient tolerated treatment well   Behavior During Therapy [facility] for tasks assessed/performed      Past Medical History:  Diagnosis Date  . Anginal pain ([place])   . 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 Chicken pox as a child  . Dizziness   . Dysrhythmia 2013   SVT- treated by ablation by Dr. Lovena Le to f/u with as needed basis   . Encounter for preventative adult health care exam with abnormal findings 02/09/2015  . HTN (hypertension)   . Hypokalemia 02/09/2015  . Measles as a child  . Measles as a child  . Obesity   . Panic attack    during episode of feeling to crowded   . Preventative health care 02/09/2015  . SOB (shortness of breath) 09/23/11   "a little bit; at rest; before ablation", SOB again now (08/2014- due to lack  of exercise)   . SVT (supraventricular tachycardia) ([place])   . Uterine fibroid 12/17/2012   Cervical polyp per patient Follows with [facility], Dr Evette Cristal    Past Surgical History:  Procedure Laterality Date  . ABDOMINAL HYSTERECTOMY  1990's  . CARDIAC ELECTROPHYSIOLOGY STUDY AND ABLATION  09/23/11  . MAXIMUM ACCESS (MAS)POSTERIOR LUMBAR INTERBODY FUSION (PLIF) 1 LEVEL N/A 09/24/2014   Procedure: L5-S1 MAS PLIF ;  Surgeon: Erline Levine, MD;  Location: [facility]UR[facility]S;  Service: Neurosurgery;  Laterality: N/A;  L5-S1 MAS PLIF fusion   . SUPRAVENTRICULAR TACHYCARDIA ABLATION N/A 09/23/2011   Procedure: SUPRAVENTRICULAR TACHYCARDIA ABLATION;  Surgeon: Evans Lance, MD;  Location: [facility] CATH LAB;  Service: Cardiovascular;  Laterality: N/A;  . TUBAL LIGATION  1980's    There were no vitals filed for this visit.      Subjective Assessment - 01/12/16 1626    Subjective Pt reports she had another very busy weekend, mostly cleaning around her house and states that pain increased to point where walking was difficult on Sunday. Pt reports she felt like latest taping pattern helped and tape remains in place.   Patient Stated Goals "Get through daily chores w/o being above an 8/10 pain"   Currently in Pain? Yes   Pain Score 6    Pain Location Back   Pain Orientation Lower;Mid   Pain Descriptors / Indicators Throbbing;Burning   Pain Radiating Towards intermittent stabbing pain in B calves          Today's Treatment  TherEx Treadmill - 1.5 mph x 5'  Self-Care Postural education for typical household chores and job tasks  TherEx POE 3x15" Quadruped over peanut ball:   Alt Hip Ext x10   Alt Shoulder flexion x10   Alt UE/LE lift x10 Quadruped 3 way prayer stretch 1x30" each         PT Education - 01/12/16 1651  Education provided Yes   Education Details Postural education for ADL's/chores around house as well as job tasks   Person(s) Educated Patient   Methods Explanation;Demonstration;Handout   Comprehension Verbalized understanding;Need further instruction          PT Short Term Goals - 12/29/15 1658      PT SHORT TERM GOAL #1   Title Independent with initial HEP by 12/19/15   Status Achieved           PT Long Term Goals - 01/08/16 1632      PT LONG TERM GOAL #1   Title Independent with advanced HEP by 01/30/16   Status On-going     PT LONG TERM GOAL #2   Title Lumbar flexibility WFL w/o increased pain by 01/30/16   Status Partially Met     PT LONG TERM GOAL #3   Title B hip and knee  strength >/= 4/5 for improved stability by 01/30/16   Status On-going     PT LONG TERM GOAL #4   Title Pt will report ability to complete daily chores and job tasks with low back pain and/or radicular pain </= 7/10 by 01/30/16   Status Partially Met  Typically 6/10 currently, but still gets up to 8/10 at times.               Plan - 01/12/16 1629    Clinical Impression Statement Pt reports back pain worse after another very busy weekend where she was cleaning her house and purging her closet. Did note some benefit from taping to back with tape still in place today, but otherwise not finding much that creates lasting relief of pain. Pt due to f/u with MD next week, therefore will assess goal status at next visit.   Rehab Potential Good   Clinical Impairments Affecting Rehab Potential L knee pain, RTC tendinopathy   PT Treatment/Interventions Patient/family education;Neuromuscular re-education;Therapeutic exercise;Manual techniques;Dry needling;Taping;Therapeutic activities;Balance training;Electrical Stimulation;Moist Heat;Traction;ADLs/Self Care Home Management   PT Next Visit Plan Assess goal status & MD note for MD visit 01/19/16; Continue focus on proximal LE flexibility and lumbar stabilization as tolerated; Manual therapy to promote muscle relaxation and increased flexibility; Modalities PRN for pain   Consulted and Agree with Plan of Care Patient      Patient will benefit from skilled therapeutic intervention in order to improve the following deficits and impairments:  Pain, Impaired flexibility, Increased muscle spasms, Decreased range of motion, Decreased strength, Postural dysfunction, Improper body mechanics, Difficulty walking  Visit Diagnosis: Chronic midline low back pain with bilateral sciatica  Difficulty in walking, not elsewhere classified  Muscle weakness (generalized)     Problem List Patient Active Problem List   Diagnosis Date Noted  . Hypokalemia  02/09/2015  . Encounter for preventative adult health care exam with abnormal findings 02/09/2015  . Preventative health care 02/09/2015  . Spondylolisthesis of lumbar region 09/24/2014  . Neck pain 04/08/2014  . Muscle cramps 01/13/2014  . Tendinopathy of rotator cuff 11/28/2013  . Essential hypertension, benign 11/28/2013  . Urinary frequency 08/17/2013  . Uterine fibroid 12/17/2012  . Obesity   . SVT (supraventricular tachycardia) ([place]) 09/21/2011  . LOW BACK PAIN 01/04/2008  . Hyperlipidemia 02/28/2007  . ALLERGIC RHINITIS 02/28/2007  . KNEE PAIN, LEFT 02/28/2007    Percival Spanish, PT, MPT 01/12/2016, 6:33 PM  [facility] [address]  [place], [place], [address] Phone: [phone]   Fax:  [phone]  Name:  COLANDRA OHANIAN MRN: [id number] Date of Birth: [birth year]

## 2016-01-15 ENCOUNTER — Ambulatory Visit: Payer: 59 | Admitting: Physical Therapy

## 2016-01-15 DIAGNOSIS — M6281 Muscle weakness (generalized): Secondary | ICD-10-CM | POA: Diagnosis not present

## 2016-01-15 DIAGNOSIS — R262 Difficulty in walking, not elsewhere classified: Secondary | ICD-10-CM

## 2016-01-15 DIAGNOSIS — G8929 Other chronic pain: Secondary | ICD-10-CM | POA: Diagnosis not present

## 2016-01-15 DIAGNOSIS — M5442 Lumbago with sciatica, left side: Principal | ICD-10-CM

## 2016-01-15 DIAGNOSIS — M5441 Lumbago with sciatica, right side: Secondary | ICD-10-CM | POA: Diagnosis not present

## 2016-01-15 NOTE — Therapy (Addendum)
[facility] [address]  [place], [place], [address] Phone: [phone]   Fax:  [phone]  Physical Therapy Treatment  Patient Details  Name: Sherri Sullivan MRN: [id number] Date of Birth: 05-26-1960 Referring Provider: Marchia Meiers. Vertell Limber, MD  Encounter Date: 01/15/2016      PT End of Session - 01/15/16 1615    Visit Number 9   Number of Visits 16   Date for PT Re-Evaluation 01/30/16   PT Start Time 4462   PT Stop Time 1702   PT Time Calculation (min) 47 min   Activity Tolerance Patient tolerated treatment well   Behavior During Therapy [facility] for tasks assessed/performed      Past Medical History:  Diagnosis Date  . Anginal pain ([place])   . 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 Chicken pox as a child  . Dizziness   . Dysrhythmia 2013   SVT- treated by ablation by Dr. Lovena Le to f/u with as needed basis   . Encounter for preventative adult health care exam with abnormal findings 02/09/2015  . HTN (hypertension)   . Hypokalemia 02/09/2015  . Measles as a child  . Measles as a child  . Obesity   . Panic attack    during episode of feeling to crowded   . Preventative health care 02/09/2015  . SOB (shortness of breath) 09/23/11   "a little bit; at rest; before ablation", SOB again now (08/2014- due to lack  of exercise)   . SVT (supraventricular tachycardia) ([place])   . Uterine fibroid 12/17/2012   Cervical polyp per patient Follows with [facility], Dr Evette Cristal    Past Surgical History:  Procedure Laterality Date  . ABDOMINAL HYSTERECTOMY  1990's  . CARDIAC ELECTROPHYSIOLOGY STUDY AND ABLATION  09/23/11  . MAXIMUM ACCESS (MAS)POSTERIOR LUMBAR INTERBODY FUSION (PLIF) 1 LEVEL N/A 09/24/2014   Procedure: L5-S1 MAS PLIF ;  Surgeon: Erline Levine, MD;  Location: [facility];  Service: Neurosurgery;  Laterality: N/A;  L5-S1 MAS PLIF fusion  . SUPRAVENTRICULAR  TACHYCARDIA ABLATION N/A 09/23/2011   Procedure: SUPRAVENTRICULAR TACHYCARDIA ABLATION;  Surgeon: Evans Lance, MD;  Location: [facility] CATH LAB;  Service: Cardiovascular;  Laterality: N/A;  . TUBAL LIGATION  1980's    There were no vitals filed for this visit.      Subjective Assessment - 01/15/16 1621    Subjective "Today is a good day. It doesn't hurt too bad today."   Patient Stated Goals "Get through daily chores w/o being above an 8/10 pain"   Currently in Pain? Yes   Pain Score 5    Pain Location Back   Pain Orientation Lower;Mid   Pain Descriptors / Indicators Dull   Pain Type Chronic pain   Pain Radiating Towards intermittnet stabbing pain in B calves            [facility] PT Assessment - 01/15/16 1615      Assessment   Medical Diagnosis Lumbar radiculopathy   Referring Provider Marchia Meiers. Vertell Limber, MD   Onset Date/Surgical Date --  January 2017   Next MD Visit 01/21/16     Observation/Other Assessments   Focus on Therapeutic Outcomes (FOTO)  Lumbar Spine - 40% (60% limitation)     AROM   Lumbar Flexion hands to lower shins (tight, but no increased pain)   Lumbar Extension 40% (increased pain)   Lumbar - Right Side  Bend hand to knee (no change in pain)   Lumbar - Left Side Bend hand to knee (no change in pain)   Lumbar - Right Rotation 80% (increased pain)   Lumbar - Left Rotation WFL (no change in pain)     Strength   Right Hip Flexion 4/5   Right Hip Extension 3+/5   Right Hip ABduction 4-/5   Right Hip ADduction 4/5   Left Hip Flexion 4/5   Left Hip Extension 4-/5   Left Hip ABduction 4/5   Left Hip ADduction 4/5   Right Knee Flexion 4/5   Right Knee Extension 4/5   Left Knee Flexion 4/5   Left Knee Extension 4/5           Today's Treatment  TherEx NuStep - lvl 5 x 5' TrA + Alt Hip ABD/ER with green TB 10x3" TrA + Hooklying march with green TB 10x3" Bridge + Hip ABD isometric with green TB 5x5" Counter Partial Squat x10 Standing B 3 way Hip SLR  x10           PT Education - 01/15/16 1700    Education provided Yes   Education Details HEP review and update   Person(s) Educated Patient   Methods Explanation;Demonstration;Handout   Comprehension Verbalized understanding;Returned demonstration          PT Short Term Goals - 12/29/15 1658      PT SHORT TERM GOAL #1   Title Independent with initial HEP by 12/19/15   Status Achieved           PT Long Term Goals - 01/15/16 1626      PT LONG TERM GOAL #1   Title Independent with advanced HEP by 01/30/16   Status Achieved     PT LONG TERM GOAL #2   Title Lumbar flexibility WFL w/o increased pain by 01/30/16   Status Partially Met  refer to PT assessment     PT LONG TERM GOAL #3   Title B hip and knee strength >/= 4/5 for improved stability by 01/30/16   Status Partially Met  Met except B hip extension & R hip abduction     PT LONG TERM GOAL #4   Title Pt will report ability to complete daily chores and job tasks with low back pain and/or radicular pain </= 7/10 by 01/30/16   Status Partially Met  Typically 6-7/10 with normal household chores and job tasks; but still gets up to 8/10 1-2x/mo when doing more than usual               Plan - 01/15/16 1625    Clinical Impression Statement Pt has demonstrated progress with PT with improved lumbar ROM/flexibility, increased LE strength and decreasing pain reports. Pt continues to report LBP at 5-6/10 most PT session and up to 6-7/10 with daily chores and job tasks, but states this is better than it was. All goals at least partially met at this time. Pt feels like she is ready to try transitioning to HEP and requested to be placed on hold x30 days.    Rehab Potential Good   Clinical Impairments Affecting Rehab Potential L knee pain, RTC tendinopathy   PT Treatment/Interventions Patient/family education;Neuromuscular re-education;Therapeutic exercise;Manual techniques;Dry needling;Taping;Therapeutic activities;Balance  training;Electrical Stimulation;Moist Heat;Traction;ADLs/Self Care Home Management   PT Next Visit Plan 30 day hold; recert if returning after 01/30/16   Consulted and Agree with Plan of Care Patient      Patient will benefit from skilled therapeutic intervention  in order to improve the following deficits and impairments:  Pain, Impaired flexibility, Increased muscle spasms, Decreased range of motion, Decreased strength, Postural dysfunction, Improper body mechanics, Difficulty walking  Visit Diagnosis: Chronic midline low back pain with bilateral sciatica  Difficulty in walking, not elsewhere classified  Muscle weakness (generalized)     Problem List Patient Active Problem List   Diagnosis Date Noted  . Hypokalemia 02/09/2015  . Encounter for preventative adult health care exam with abnormal findings 02/09/2015  . Preventative health care 02/09/2015  . Spondylolisthesis of lumbar region 09/24/2014  . Neck pain 04/08/2014  . Muscle cramps 01/13/2014  . Tendinopathy of rotator cuff 11/28/2013  . Essential hypertension, benign 11/28/2013  . Urinary frequency 08/17/2013  . Uterine fibroid 12/17/2012  . Obesity   . SVT (supraventricular tachycardia) ([place]) 09/21/2011  . LOW BACK PAIN 01/04/2008  . Hyperlipidemia 02/28/2007  . ALLERGIC RHINITIS 02/28/2007  . KNEE PAIN, LEFT 02/28/2007    Percival Spanish, PT, MPT 01/15/2016, 5:23 PM  [facility] [address]  [place], [place], [address] Phone: [phone]   Fax:  [phone]  Name: Sherri Sullivan MRN: [id number] Date of Birth: 31-Dec-1960   PHYSICAL THERAPY DISCHARGE SUMMARY  Visits from Start of Care: 9  Current functional level related to goals / functional outcomes:   Refer to above clinical impression for status as of last visit on 01/15/16, at which time pt requested to be placed on hold x 30 days. Pt has not returned to PT in >30 days, therefore will  proceed with discharge from PT for this episode.   Remaining deficits:   As above   Education / Equipment:   HEP  Plan: Patient agrees to discharge.  Patient goals were partially met. Patient is being discharged due to being pleased with the current functional level.  ?????   Percival Spanish, PT, MPT 02/16/16, 4:46 PM  [facility] [address]  [place], [place], [address] Phone: [phone]   Fax:  [phone]

## 2016-01-21 DIAGNOSIS — M4807 Spinal stenosis, lumbosacral region: Secondary | ICD-10-CM | POA: Diagnosis not present

## 2016-01-21 DIAGNOSIS — Z6836 Body mass index (BMI) 36.0-36.9, adult: Secondary | ICD-10-CM | POA: Diagnosis not present

## 2016-01-21 DIAGNOSIS — G629 Polyneuropathy, unspecified: Secondary | ICD-10-CM | POA: Insufficient documentation

## 2016-01-21 DIAGNOSIS — M545 Low back pain: Secondary | ICD-10-CM | POA: Diagnosis not present

## 2016-01-21 DIAGNOSIS — M4317 Spondylolisthesis, lumbosacral region: Secondary | ICD-10-CM | POA: Diagnosis not present

## 2016-02-02 MED FILL — PREMARIN 0.9 MG TABLET: 0.9 | 28 days supply | Qty: 21 | Fill #3

## 2016-02-03 ENCOUNTER — Other Ambulatory Visit (INDEPENDENT_AMBULATORY_CARE_PROVIDER_SITE_OTHER): Payer: 59

## 2016-02-03 DIAGNOSIS — I10 Essential (primary) hypertension: Secondary | ICD-10-CM

## 2016-02-03 DIAGNOSIS — E785 Hyperlipidemia, unspecified: Secondary | ICD-10-CM | POA: Diagnosis not present

## 2016-02-03 LAB — CBC WITH DIFFERENTIAL/PLATELET
BASOS PCT: 0.9 % (ref 0.0–3.0)
Basophils Absolute: 0 10*3/uL (ref 0.0–0.1)
EOS ABS: 0.3 10*3/uL (ref 0.0–0.7)
Eosinophils Relative: 6.9 % — ABNORMAL HIGH (ref 0.0–5.0)
HCT: 38.5 % (ref 36.0–46.0)
HEMOGLOBIN: 12.9 g/dL (ref 12.0–15.0)
LYMPHS ABS: 2.4 10*3/uL (ref 0.7–4.0)
Lymphocytes Relative: 48.6 % — ABNORMAL HIGH (ref 12.0–46.0)
MCHC: 33.6 g/dL (ref 30.0–36.0)
MCV: 86.9 fl (ref 78.0–100.0)
MONO ABS: 0.4 10*3/uL (ref 0.1–1.0)
Monocytes Relative: 7.6 % (ref 3.0–12.0)
NEUTROS PCT: 36 % — AB (ref 43.0–77.0)
Neutro Abs: 1.8 10*3/uL (ref 1.4–7.7)
Platelets: 224 10*3/uL (ref 150.0–400.0)
RBC: 4.43 Mil/uL (ref 3.87–5.11)
RDW: 16.2 % — AB (ref 11.5–15.5)
WBC: 4.9 10*3/uL (ref 4.0–10.5)

## 2016-02-03 LAB — COMPREHENSIVE METABOLIC PANEL
ALBUMIN: 3.8 g/dL (ref 3.5–5.2)
ALK PHOS: 49 U/L (ref 39–117)
ALT: 14 U/L (ref 0–35)
AST: 17 U/L (ref 0–37)
BUN: 17 mg/dL (ref 6–23)
CO2: 25 mEq/L (ref 19–32)
CREATININE: 0.65 mg/dL (ref 0.40–1.20)
Calcium: 9.3 mg/dL (ref 8.4–10.5)
Chloride: 107 mEq/L (ref 96–112)
GFR: 121.43 mL/min (ref >60.00)
GLUCOSE: 94 mg/dL (ref 70–99)
Potassium: 3.8 mEq/L (ref 3.5–5.1)
SODIUM: 139 meq/L (ref 135–145)
TOTAL PROTEIN: 6.9 g/dL (ref 6.0–8.3)
Total Bilirubin: 0.9 mg/dL (ref 0.2–1.2)

## 2016-02-03 LAB — LIPID PANEL
CHOLESTEROL: 190 mg/dL (ref 0–200)
HDL: 63.5 mg/dL (ref >39.00)
LDL Cholesterol: 107 mg/dL — ABNORMAL HIGH (ref 0–99)
NONHDL: 126
Total CHOL/HDL Ratio: 3
Triglycerides: 94 mg/dL (ref 0.0–149.0)
VLDL: 18.8 mg/dL (ref 0.0–40.0)

## 2016-02-03 LAB — TSH: TSH: 1.13 u[IU]/mL (ref 0.35–4.50)

## 2016-02-09 ENCOUNTER — Ambulatory Visit (INDEPENDENT_AMBULATORY_CARE_PROVIDER_SITE_OTHER): Payer: 59 | Admitting: Family Medicine

## 2016-02-09 ENCOUNTER — Encounter: Payer: Self-pay | Admitting: Family Medicine

## 2016-02-09 ENCOUNTER — Other Ambulatory Visit: Payer: Self-pay | Admitting: Family Medicine

## 2016-02-09 VITALS — BP 108/70 | HR 78 | Temp 98.1°F | Ht 63.0 in | Wt 208.1 lb

## 2016-02-09 DIAGNOSIS — G8929 Other chronic pain: Secondary | ICD-10-CM

## 2016-02-09 DIAGNOSIS — Z0000 Encounter for general adult medical examination without abnormal findings: Secondary | ICD-10-CM | POA: Diagnosis not present

## 2016-02-09 DIAGNOSIS — M5441 Lumbago with sciatica, right side: Secondary | ICD-10-CM | POA: Diagnosis not present

## 2016-02-09 DIAGNOSIS — Z1231 Encounter for screening mammogram for malignant neoplasm of breast: Secondary | ICD-10-CM

## 2016-02-09 DIAGNOSIS — E669 Obesity, unspecified: Secondary | ICD-10-CM | POA: Diagnosis not present

## 2016-02-09 DIAGNOSIS — M5442 Lumbago with sciatica, left side: Secondary | ICD-10-CM

## 2016-02-09 DIAGNOSIS — I10 Essential (primary) hypertension: Secondary | ICD-10-CM | POA: Diagnosis not present

## 2016-02-09 DIAGNOSIS — E785 Hyperlipidemia, unspecified: Secondary | ICD-10-CM

## 2016-02-09 MED ORDER — TRIAMTERENE-HCTZ 37.5-25 MG PO TABS: 1.0000 | ORAL_TABLET | Freq: Every day | ORAL | 2 refills | Status: DC

## 2016-02-09 MED ORDER — METHOCARBAMOL 500 MG PO TABS: 500.0000 mg | ORAL_TABLET | Freq: Four times a day (QID) | ORAL | 1 refills | Status: DC | PRN

## 2016-02-09 MED ORDER — ESTROGENS CONJUGATED 0.9 MG PO TABS: 0.9000 mg | ORAL_TABLET | Freq: Every day | ORAL | 1 refills | Status: DC

## 2016-02-09 MED ORDER — POTASSIUM CHLORIDE CRYS ER 20 MEQ PO TBCR
EXTENDED_RELEASE_TABLET | ORAL | 2 refills | Status: DC
Start: 2016-02-09 — End: 2016-11-24

## 2016-02-09 MED ORDER — DICLOFENAC SODIUM 75 MG PO TBEC: 75.0000 mg | DELAYED_RELEASE_TABLET | Freq: Two times a day (BID) | ORAL | 2 refills | Status: DC

## 2016-02-09 MED FILL — METHOCARBAMOL 500 MG TABLET: 500 | 20 days supply | Qty: 80 | Fill #0

## 2016-02-09 MED FILL — DICLOFENAC SOD 75 MG TAB EC: 75 | 90 days supply | Qty: 180 | Fill #0

## 2016-02-09 NOTE — Progress Notes (Signed)
Pre visit review using our clinic review tool, if applicable. No additional management support is needed unless otherwise documented below in the visit note. 

## 2016-02-09 NOTE — Patient Instructions (Signed)
Preventive Care for Adults, Female A healthy lifestyle and preventive care can promote health and wellness. Preventive health guidelines for women include the following key practices.  A routine yearly physical is a good way to check with your health care provider about your health and preventive screening. It is a chance to share any concerns and updates on your health and to receive a thorough exam.  Visit your dentist for a routine exam and preventive care every 6 months. Brush your teeth twice a day and floss once a day. Good oral hygiene prevents tooth decay and gum disease.  The frequency of eye exams is based on your age, health, family medical history, use of contact lenses, and other factors. Follow your health care provider's recommendations for frequency of eye exams.  Eat a healthy diet. Foods like vegetables, fruits, whole grains, low-fat dairy products, and lean protein foods contain the nutrients you need without too many calories. Decrease your intake of foods high in solid fats, added sugars, and salt. Eat the right amount of calories for you.Get information about a proper diet from your health care provider, if necessary.  Regular physical exercise is one of the most important things you can do for your health. Most adults should get at least 150 minutes of moderate-intensity exercise (any activity that increases your heart rate and causes you to sweat) each week. In addition, most adults need muscle-strengthening exercises on 2 or more days a week.  Maintain a healthy weight. The body mass index (BMI) is a screening tool to identify possible weight problems. It provides an estimate of body fat based on height and weight. Your health care provider can find your BMI and can help you achieve or maintain a healthy weight.For adults 20 years and older:  A BMI below 18.5 is considered underweight.  A BMI of 18.5 to 24.9 is normal.  A BMI of 25 to 29.9 is considered overweight.  A  BMI of 30 and above is considered obese.  Maintain normal blood lipids and cholesterol levels by exercising and minimizing your intake of saturated fat. Eat a balanced diet with plenty of fruit and vegetables. Blood tests for lipids and cholesterol should begin at age 45 and be repeated every 5 years. If your lipid or cholesterol levels are high, you are over 50, or you are at high risk for heart disease, you may need your cholesterol levels checked more frequently.Ongoing high lipid and cholesterol levels should be treated with medicines if diet and exercise are not working.  If you smoke, find out from your health care provider how to quit. If you do not use tobacco, do not start.  Lung cancer screening is recommended for adults aged 45-80 years who are at high risk for developing lung cancer because of a history of smoking. A yearly low-dose CT scan of the lungs is recommended for people who have at least a 30-pack-year history of smoking and are a current smoker or have quit within the past 15 years. A pack year of smoking is smoking an average of 1 pack of cigarettes a day for 1 year (for example: 1 pack a day for 30 years or 2 packs a day for 15 years). Yearly screening should continue until the smoker has stopped smoking for at least 15 years. Yearly screening should be stopped for people who develop a health problem that would prevent them from having lung cancer treatment.  If you are pregnant, do not drink alcohol. If you are  breastfeeding, be very cautious about drinking alcohol. If you are not pregnant and choose to drink alcohol, do not have more than 1 drink per day. One drink is considered to be 12 ounces (355 mL) of beer, 5 ounces (148 mL) of wine, or 1.5 ounces (44 mL) of liquor.  Avoid use of street drugs. Do not share needles with anyone. Ask for help if you need support or instructions about stopping the use of drugs.  High blood pressure causes heart disease and increases the risk  of stroke. Your blood pressure should be checked at least every 1 to 2 years. Ongoing high blood pressure should be treated with medicines if weight loss and exercise do not work.  If you are 55-79 years old, ask your health care provider if you should take aspirin to prevent strokes.  Diabetes screening is done by taking a blood sample to check your blood glucose level after you have not eaten for a certain period of time (fasting). If you are not overweight and you do not have risk factors for diabetes, you should be screened once every 3 years starting at age 45. If you are overweight or obese and you are 40-70 years of age, you should be screened for diabetes every year as part of your cardiovascular risk assessment.  Breast cancer screening is essential preventive care for women. You should practice "breast self-awareness." This means understanding the normal appearance and feel of your breasts and may include breast self-examination. Any changes detected, no matter how small, should be reported to a health care provider. Women in their 20s and 30s should have a clinical breast exam (CBE) by a health care provider as part of a regular health exam every 1 to 3 years. After age 40, women should have a CBE every year. Starting at age 40, women should consider having a mammogram (breast X-ray test) every year. Women who have a family history of breast cancer should talk to their health care provider about genetic screening. Women at a high risk of breast cancer should talk to their health care providers about having an MRI and a mammogram every year.  Breast cancer gene (BRCA)-related cancer risk assessment is recommended for women who have family members with BRCA-related cancers. BRCA-related cancers include breast, ovarian, tubal, and peritoneal cancers. Having family members with these cancers may be associated with an increased risk for harmful changes (mutations) in the breast cancer genes BRCA1 and  BRCA2. Results of the assessment will determine the need for genetic counseling and BRCA1 and BRCA2 testing.  Your health care provider may recommend that you be screened regularly for cancer of the pelvic organs (ovaries, uterus, and vagina). This screening involves a pelvic examination, including checking for microscopic changes to the surface of your cervix (Pap test). You may be encouraged to have this screening done every 3 years, beginning at age 21.  For women ages 30-65, health care providers may recommend pelvic exams and Pap testing every 3 years, or they may recommend the Pap and pelvic exam, combined with testing for human papilloma virus (HPV), every 5 years. Some types of HPV increase your risk of cervical cancer. Testing for HPV may also be done on women of any age with unclear Pap test results.  Other health care providers may not recommend any screening for nonpregnant women who are considered low risk for pelvic cancer and who do not have symptoms. Ask your health care provider if a screening pelvic exam is right for   you.  If you have had past treatment for cervical cancer or a condition that could lead to cancer, you need Pap tests and screening for cancer for at least 20 years after your treatment. If Pap tests have been discontinued, your risk factors (such as having a new sexual partner) need to be reassessed to determine if screening should resume. Some women have medical problems that increase the chance of getting cervical cancer. In these cases, your health care provider may recommend more frequent screening and Pap tests.  Colorectal cancer can be detected and often prevented. Most routine colorectal cancer screening begins at the age of 50 years and continues through age 75 years. However, your health care provider may recommend screening at an earlier age if you have risk factors for colon cancer. On a yearly basis, your health care provider may provide home test kits to check  for hidden blood in the stool. Use of a small camera at the end of a tube, to directly examine the colon (sigmoidoscopy or colonoscopy), can detect the earliest forms of colorectal cancer. Talk to your health care provider about this at age 50, when routine screening begins. Direct exam of the colon should be repeated every 5-10 years through age 75 years, unless early forms of precancerous polyps or small growths are found.  People who are at an increased risk for hepatitis B should be screened for this virus. You are considered at high risk for hepatitis B if:  You were born in a country where hepatitis B occurs often. Talk with your health care provider about which countries are considered high risk.  Your parents were born in a high-risk country and you have not received a shot to protect against hepatitis B (hepatitis B vaccine).  You have HIV or AIDS.  You use needles to inject street drugs.  You live with, or have sex with, someone who has hepatitis B.  You get hemodialysis treatment.  You take certain medicines for conditions like cancer, organ transplantation, and autoimmune conditions.  Hepatitis C blood testing is recommended for all people born from 1945 through 1965 and any individual with known risks for hepatitis C.  Practice safe sex. Use condoms and avoid high-risk sexual practices to reduce the spread of sexually transmitted infections (STIs). STIs include gonorrhea, chlamydia, syphilis, trichomonas, herpes, HPV, and human immunodeficiency virus (HIV). Herpes, HIV, and HPV are viral illnesses that have no cure. They can result in disability, cancer, and death.  You should be screened for sexually transmitted illnesses (STIs) including gonorrhea and chlamydia if:  You are sexually active and are younger than 24 years.  You are older than 24 years and your health care provider tells you that you are at risk for this type of infection.  Your sexual activity has changed  since you were last screened and you are at an increased risk for chlamydia or gonorrhea. Ask your health care provider if you are at risk.  If you are at risk of being infected with HIV, it is recommended that you take a prescription medicine daily to prevent HIV infection. This is called preexposure prophylaxis (PrEP). You are considered at risk if:  You are sexually active and do not regularly use condoms or know the HIV status of your partner(s).  You take drugs by injection.  You are sexually active with a partner who has HIV.  Talk with your health care provider about whether you are at high risk of being infected with HIV. If   you choose to begin PrEP, you should first be tested for HIV. You should then be tested every 3 months for as long as you are taking PrEP.  Osteoporosis is a disease in which the bones lose minerals and strength with aging. This can result in serious bone fractures or breaks. The risk of osteoporosis can be identified using a bone density scan. Women ages 67 years and over and women at risk for fractures or osteoporosis should discuss screening with their health care providers. Ask your health care provider whether you should take a calcium supplement or vitamin D to reduce the rate of osteoporosis.  Menopause can be associated with physical symptoms and risks. Hormone replacement therapy is available to decrease symptoms and risks. You should talk to your health care provider about whether hormone replacement therapy is right for you.  Use sunscreen. Apply sunscreen liberally and repeatedly throughout the day. You should seek shade when your shadow is shorter than you. Protect yourself by wearing long sleeves, pants, a wide-brimmed hat, and sunglasses year round, whenever you are outdoors.  Once a month, do a whole body skin exam, using a mirror to look at the skin on your back. Tell your health care provider of new moles, moles that have irregular borders, moles that  are larger than a pencil eraser, or moles that have changed in shape or color.  Stay current with required vaccines (immunizations).  Influenza vaccine. All adults should be immunized every year.  Tetanus, diphtheria, and acellular pertussis (Td, Tdap) vaccine. Pregnant women should receive 1 dose of Tdap vaccine during each pregnancy. The dose should be obtained regardless of the length of time since the last dose. Immunization is preferred during the 27th-36th week of gestation. An adult who has not previously received Tdap or who does not know her vaccine status should receive 1 dose of Tdap. This initial dose should be followed by tetanus and diphtheria toxoids (Td) booster doses every 10 years. Adults with an unknown or incomplete history of completing a 3-dose immunization series with Td-containing vaccines should begin or complete a primary immunization series including a Tdap dose. Adults should receive a Td booster every 10 years.  Varicella vaccine. An adult without evidence of immunity to varicella should receive 2 doses or a second dose if she has previously received 1 dose. Pregnant females who do not have evidence of immunity should receive the first dose after pregnancy. This first dose should be obtained before leaving the health care facility. The second dose should be obtained 4-8 weeks after the first dose.  Human papillomavirus (HPV) vaccine. Females aged 13-26 years who have not received the vaccine previously should obtain the 3-dose series. The vaccine is not recommended for use in pregnant females. However, pregnancy testing is not needed before receiving a dose. If a female is found to be pregnant after receiving a dose, no treatment is needed. In that case, the remaining doses should be delayed until after the pregnancy. Immunization is recommended for any person with an immunocompromised condition through the age of 61 years if she did not get any or all doses earlier. During the  3-dose series, the second dose should be obtained 4-8 weeks after the first dose. The third dose should be obtained 24 weeks after the first dose and 16 weeks after the second dose.  Zoster vaccine. One dose is recommended for adults aged 30 years or older unless certain conditions are present.  Measles, mumps, and rubella (MMR) vaccine. Adults born  before 1957 generally are considered immune to measles and mumps. Adults born in 1957 or later should have 1 or more doses of MMR vaccine unless there is a contraindication to the vaccine or there is laboratory evidence of immunity to each of the three diseases. A routine second dose of MMR vaccine should be obtained at least 28 days after the first dose for students attending postsecondary schools, health care workers, or international travelers. People who received inactivated measles vaccine or an unknown type of measles vaccine during 1963-1967 should receive 2 doses of MMR vaccine. People who received inactivated mumps vaccine or an unknown type of mumps vaccine before 1979 and are at high risk for mumps infection should consider immunization with 2 doses of MMR vaccine. For females of childbearing age, rubella immunity should be determined. If there is no evidence of immunity, females who are not pregnant should be vaccinated. If there is no evidence of immunity, females who are pregnant should delay immunization until after pregnancy. Unvaccinated health care workers born before 1957 who lack laboratory evidence of measles, mumps, or rubella immunity or laboratory confirmation of disease should consider measles and mumps immunization with 2 doses of MMR vaccine or rubella immunization with 1 dose of MMR vaccine.  Pneumococcal 13-valent conjugate (PCV13) vaccine. When indicated, a person who is uncertain of his immunization history and has no record of immunization should receive the PCV13 vaccine. All adults 65 years of age and older should receive this  vaccine. An adult aged 19 years or older who has certain medical conditions and has not been previously immunized should receive 1 dose of PCV13 vaccine. This PCV13 should be followed with a dose of pneumococcal polysaccharide (PPSV23) vaccine. Adults who are at high risk for pneumococcal disease should obtain the PPSV23 vaccine at least 8 weeks after the dose of PCV13 vaccine. Adults older than 55 years of age who have normal immune system function should obtain the PPSV23 vaccine dose at least 1 year after the dose of PCV13 vaccine.  Pneumococcal polysaccharide (PPSV23) vaccine. When PCV13 is also indicated, PCV13 should be obtained first. All adults aged 65 years and older should be immunized. An adult younger than age 65 years who has certain medical conditions should be immunized. Any person who resides in a nursing home or long-term care facility should be immunized. An adult smoker should be immunized. People with an immunocompromised condition and certain other conditions should receive both PCV13 and PPSV23 vaccines. People with human immunodeficiency virus (HIV) infection should be immunized as soon as possible after diagnosis. Immunization during chemotherapy or radiation therapy should be avoided. Routine use of PPSV23 vaccine is not recommended for American Indians, [place] Natives, or people younger than 65 years unless there are medical conditions that require PPSV23 vaccine. When indicated, people who have unknown immunization and have no record of immunization should receive PPSV23 vaccine. One-time revaccination 5 years after the first dose of PPSV23 is recommended for people aged 19-64 years who have chronic kidney failure, nephrotic syndrome, asplenia, or immunocompromised conditions. People who received 1-2 doses of PPSV23 before age 65 years should receive another dose of PPSV23 vaccine at age 65 years or later if at least 5 years have passed since the previous dose. Doses of PPSV23 are not  needed for people immunized with PPSV23 at or after age 65 years.  Meningococcal vaccine. Adults with asplenia or persistent complement component deficiencies should receive 2 doses of quadrivalent meningococcal conjugate (MenACWY-D) vaccine. The doses should be obtained   at least 2 months apart. Microbiologists working with certain meningococcal bacteria, [place] recruits, people at risk during an outbreak, and people who travel to or live in countries with a high rate of meningitis should be immunized. A first-year college student up through age 34 years who is living in a residence hall should receive a dose if she did not receive a dose on or after her 16th birthday. Adults who have certain high-risk conditions should receive one or more doses of vaccine.  Hepatitis A vaccine. Adults who wish to be protected from this disease, have certain high-risk conditions, work with hepatitis A-infected animals, work in hepatitis A research labs, or travel to or work in countries with a high rate of hepatitis A should be immunized. Adults who were previously unvaccinated and who anticipate close contact with an international adoptee during the first 60 days after arrival in the [place] from a country with a high rate of hepatitis A should be immunized.  Hepatitis B vaccine. Adults who wish to be protected from this disease, have certain high-risk conditions, may be exposed to blood or other infectious body fluids, are household contacts or sex partners of hepatitis B positive people, are clients or workers in certain care facilities, or travel to or work in countries with a high rate of hepatitis B should be immunized.  Haemophilus influenzae type b (Hib) vaccine. A previously unvaccinated person with asplenia or sickle cell disease or having a scheduled splenectomy should receive 1 dose of Hib vaccine. Regardless of previous immunization, a recipient of a hematopoietic stem cell transplant should receive a  3-dose series 6-12 months after her successful transplant. Hib vaccine is not recommended for adults with HIV infection. Preventive Services / Frequency Ages 35 to 4 years  Blood pressure check.** / Every 3-5 years.  Lipid and cholesterol check.** / Every 5 years beginning at age 60.  Clinical breast exam.** / Every 3 years for women in their 71s and 10s.  BRCA-related cancer risk assessment.** / For women who have family members with a BRCA-related cancer (breast, ovarian, tubal, or peritoneal cancers).  Pap test.** / Every 2 years from ages 76 through 26. Every 3 years starting at age 61 through age 76 or 93 with a history of 3 consecutive normal Pap tests.  HPV screening.** / Every 3 years from ages 37 through ages 60 to 51 with a history of 3 consecutive normal Pap tests.  Hepatitis C blood test.** / For any individual with known risks for hepatitis C.  Skin self-exam. / Monthly.  Influenza vaccine. / Every year.  Tetanus, diphtheria, and acellular pertussis (Tdap, Td) vaccine.** / Consult your health care provider. Pregnant women should receive 1 dose of Tdap vaccine during each pregnancy. 1 dose of Td every 10 years.  Varicella vaccine.** / Consult your health care provider. Pregnant females who do not have evidence of immunity should receive the first dose after pregnancy.  HPV vaccine. / 3 doses over 6 months, if 93 and younger. The vaccine is not recommended for use in pregnant females. However, pregnancy testing is not needed before receiving a dose.  Measles, mumps, rubella (MMR) vaccine.** / You need at least 1 dose of MMR if you were born in 1957 or later. You may also need a 2nd dose. For females of childbearing age, rubella immunity should be determined. If there is no evidence of immunity, females who are not pregnant should be vaccinated. If there is no evidence of immunity, females who are  pregnant should delay immunization until after pregnancy.  Pneumococcal  13-valent conjugate (PCV13) vaccine.** / Consult your health care provider.  Pneumococcal polysaccharide (PPSV23) vaccine.** / 1 to 2 doses if you smoke cigarettes or if you have certain conditions.  Meningococcal vaccine.** / 1 dose if you are age 68 to 8 years and a Market researcher living in a residence hall, or have one of several medical conditions, you need to get vaccinated against meningococcal disease. You may also need additional booster doses.  Hepatitis A vaccine.** / Consult your health care provider.  Hepatitis B vaccine.** / Consult your health care provider.  Haemophilus influenzae type b (Hib) vaccine.** / Consult your health care provider. Ages 7 to 53 years  Blood pressure check.** / Every year.  Lipid and cholesterol check.** / Every 5 years beginning at age 25 years.  Lung cancer screening. / Every year if you are aged 11-80 years and have a 30-pack-year history of smoking and currently smoke or have quit within the past 15 years. Yearly screening is stopped once you have quit smoking for at least 15 years or develop a health problem that would prevent you from having lung cancer treatment.  Clinical breast exam.** / Every year after age 48 years.  BRCA-related cancer risk assessment.** / For women who have family members with a BRCA-related cancer (breast, ovarian, tubal, or peritoneal cancers).  Mammogram.** / Every year beginning at age 41 years and continuing for as long as you are in good health. Consult with your health care provider.  Pap test.** / Every 3 years starting at age 65 years through age 37 or 70 years with a history of 3 consecutive normal Pap tests.  HPV screening.** / Every 3 years from ages 72 years through ages 60 to 40 years with a history of 3 consecutive normal Pap tests.  Fecal occult blood test (FOBT) of stool. / Every year beginning at age 21 years and continuing until age 5 years. You may not need to do this test if you get  a colonoscopy every 10 years.  Flexible sigmoidoscopy or colonoscopy.** / Every 5 years for a flexible sigmoidoscopy or every 10 years for a colonoscopy beginning at age 35 years and continuing until age 48 years.  Hepatitis C blood test.** / For all people born from [birth year] through 1965 and any individual with known risks for hepatitis C.  Skin self-exam. / Monthly.  Influenza vaccine. / Every year.  Tetanus, diphtheria, and acellular pertussis (Tdap/Td) vaccine.** / Consult your health care provider. Pregnant women should receive 1 dose of Tdap vaccine during each pregnancy. 1 dose of Td every 10 years.  Varicella vaccine.** / Consult your health care provider. Pregnant females who do not have evidence of immunity should receive the first dose after pregnancy.  Zoster vaccine.** / 1 dose for adults aged 30 years or older.  Measles, mumps, rubella (MMR) vaccine.** / You need at least 1 dose of MMR if you were born in 1957 or later. You may also need a second dose. For females of childbearing age, rubella immunity should be determined. If there is no evidence of immunity, females who are not pregnant should be vaccinated. If there is no evidence of immunity, females who are pregnant should delay immunization until after pregnancy.  Pneumococcal 13-valent conjugate (PCV13) vaccine.** / Consult your health care provider.  Pneumococcal polysaccharide (PPSV23) vaccine.** / 1 to 2 doses if you smoke cigarettes or if you have certain conditions.  Meningococcal vaccine.** /  Consult your health care provider.  Hepatitis A vaccine.** / Consult your health care provider.  Hepatitis B vaccine.** / Consult your health care provider.  Haemophilus influenzae type b (Hib) vaccine.** / Consult your health care provider. Ages 64 years and over  Blood pressure check.** / Every year.  Lipid and cholesterol check.** / Every 5 years beginning at age 23 years.  Lung cancer screening. / Every year if you  are aged 16-80 years and have a 30-pack-year history of smoking and currently smoke or have quit within the past 15 years. Yearly screening is stopped once you have quit smoking for at least 15 years or develop a health problem that would prevent you from having lung cancer treatment.  Clinical breast exam.** / Every year after age 74 years.  BRCA-related cancer risk assessment.** / For women who have family members with a BRCA-related cancer (breast, ovarian, tubal, or peritoneal cancers).  Mammogram.** / Every year beginning at age 44 years and continuing for as long as you are in good health. Consult with your health care provider.  Pap test.** / Every 3 years starting at age 58 years through age 22 or 39 years with 3 consecutive normal Pap tests. Testing can be stopped between 65 and 70 years with 3 consecutive normal Pap tests and no abnormal Pap or HPV tests in the past 10 years.  HPV screening.** / Every 3 years from ages 64 years through ages 70 or 61 years with a history of 3 consecutive normal Pap tests. Testing can be stopped between 65 and 70 years with 3 consecutive normal Pap tests and no abnormal Pap or HPV tests in the past 10 years.  Fecal occult blood test (FOBT) of stool. / Every year beginning at age 40 years and continuing until age 27 years. You may not need to do this test if you get a colonoscopy every 10 years.  Flexible sigmoidoscopy or colonoscopy.** / Every 5 years for a flexible sigmoidoscopy or every 10 years for a colonoscopy beginning at age 7 years and continuing until age 32 years.  Hepatitis C blood test.** / For all people born from [birth year] through 1965 and any individual with known risks for hepatitis C.  Osteoporosis screening.** / A one-time screening for women ages 30 years and over and women at risk for fractures or osteoporosis.  Skin self-exam. / Monthly.  Influenza vaccine. / Every year.  Tetanus, diphtheria, and acellular pertussis (Tdap/Td)  vaccine.** / 1 dose of Td every 10 years.  Varicella vaccine.** / Consult your health care provider.  Zoster vaccine.** / 1 dose for adults aged 35 years or older.  Pneumococcal 13-valent conjugate (PCV13) vaccine.** / Consult your health care provider.  Pneumococcal polysaccharide (PPSV23) vaccine.** / 1 dose for all adults aged 46 years and older.  Meningococcal vaccine.** / Consult your health care provider.  Hepatitis A vaccine.** / Consult your health care provider.  Hepatitis B vaccine.** / Consult your health care provider.  Haemophilus influenzae type b (Hib) vaccine.** / Consult your health care provider. ** Family history and personal history of risk and conditions may change your health care provider's recommendations.   This information is not intended to replace advice given to you by your health care provider. Make sure you discuss any questions you have with your health care provider.   Document Released: 05/11/2001 Document Revised: 04/05/2014 Document Reviewed: 08/10/2010 Elsevier Interactive Patient Education [employer].

## 2016-02-23 NOTE — Progress Notes (Signed)
Patient ID: ANGEE FITTING, female   DOB: 1960/12/06, 55 y.o.   MRN: [id number]   Subjective:    Patient ID: JOYICE FUDA, female    DOB: [birth year], 55 y.o.   MRN: [id number]  Chief Complaint  Patient presents with  . Annual Exam    HPI Patient is in today for annual preventative exam and follow up on numerous medical concerns. Continues to struggle with low back pain and radicular symptoms. Has been seen by neurology and she has been told she does not have neuropathy but she does continue to complain of intermittent sense of decreased sensation in both legs. No recent illness or trauma. No incontinence. Has been trying to eat a heart healthy diet and stay as active as tolerated. Denies CP/palp/SOB/HA/congestion/fevers/GI or GU c/o. Taking meds as prescribed  Past Medical History:  Diagnosis Date  . Anginal pain ([place])   . 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 Chicken pox as a child  . Dizziness   . Dysrhythmia 2013   SVT- treated by ablation by Dr. Lovena Le to f/u with as needed basis   . Encounter for preventative adult health care exam with abnormal findings 02/09/2015  . HTN (hypertension)   . Hypokalemia 02/09/2015  . Measles as a child  . Measles as a child  . Obesity   . Panic attack    during episode of feeling to crowded   . Preventative health care 02/09/2015  . SOB (shortness of breath) 09/23/11   "a little bit; at rest; before ablation", SOB again now (08/2014- due to lack  of exercise)   . SVT (supraventricular tachycardia) ([place])   . Uterine fibroid 12/17/2012   Cervical polyp per patient Follows with [facility], Dr Evette Cristal    Past Surgical History:  Procedure Laterality Date  . ABDOMINAL HYSTERECTOMY  1990's  . CARDIAC ELECTROPHYSIOLOGY STUDY AND ABLATION  09/23/11  . MAXIMUM ACCESS (MAS)POSTERIOR LUMBAR INTERBODY FUSION (PLIF) 1 LEVEL N/A 09/24/2014   Procedure: L5-S1 MAS PLIF ;  Surgeon: Erline Levine, MD;   Location: [facility];  Service: Neurosurgery;  Laterality: N/A;  L5-S1 MAS PLIF fusion  . SUPRAVENTRICULAR TACHYCARDIA ABLATION N/A 09/23/2011   Procedure: SUPRAVENTRICULAR TACHYCARDIA ABLATION;  Surgeon: Evans Lance, MD;  Location: [facility] CATH LAB;  Service: Cardiovascular;  Laterality: N/A;  . TUBAL LIGATION  1980's    Family History  Problem Relation Age of Onset  . Cancer Sister     lung  . Diabetes Mother     type 2  . Heart disease Mother   . Heart disease Father   . Hypertension Daughter   . Stroke Maternal Grandmother   . Diabetes Maternal Aunt   . Kidney disease Maternal Aunt   . Diabetes Maternal Uncle     Social History   Social History  . Marital status: Married    Spouse name: N/A  . Number of children: N/A  . Years of education: N/A   Occupational History  . Not on file.   Social History Main Topics  . Smoking status: Former Smoker    Packs/day: 0.12    Years: 8.00    Types: Cigarettes    Quit date: 03/29/1984  . Smokeless tobacco: Never Used  . Alcohol use 8.4 oz/week    14 Glasses of wine per week  . Drug use: No  . Sexual activity: Not Currently   Other Topics Concern  .  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 cetirizine (ZYRTEC) 10 MG tablet Take 10 mg by mouth daily.    . Multiple Vitamins-Minerals (MULTIVITAMIN WITH MINERALS) tablet Take 1 tablet by mouth 3 (three) times a week.    . OMEGA-3 KRILL OIL PO Take 353 mg by mouth daily. [employer]    . Probiotic Product (PROBIOTIC PO) Take 2 tablets by mouth at bedtime.    . diclofenac (VOLTAREN) 75 MG EC tablet Take 75 mg by mouth 2 (two) times daily.    Marland Kitchen estrogens, conjugated, (PREMARIN) 0.9 MG tablet Take 1 tablet (0.9 mg total) by mouth daily. Take daily for 21 days then do not take for  7 days. 70 tablet 1  . methocarbamol (ROBAXIN) 500 MG tablet Take 1 tablet (500 mg total) by mouth every 6 (six) hours as needed for muscle spasms. 80 tablet 1  . potassium chloride SA (K-DUR,KLOR-CON) 20 MEQ tablet TAKE 1 TABLET (20 MEQ TOTAL) BY MOUTH 2 (TWO) TIMES DAILY. 180 tablet 2  . triamterene-hydrochlorothiazide (MAXZIDE-25) 37.5-25 MG tablet TAKE 1 TABLET BY MOUTH DAILY. 90 tablet 1   No facility-administered medications prior to visit.     No Known Allergies  Review of Systems  Constitutional: Positive for malaise/fatigue. Negative for chills and fever.  HENT: Negative for congestion and hearing loss.   Eyes: Negative for discharge.  Respiratory: Negative for cough, sputum production and shortness of breath.   Cardiovascular: Negative for chest pain, palpitations and leg swelling.  Gastrointestinal: Negative for abdominal pain, blood in stool, constipation, diarrhea, heartburn, nausea and vomiting.  Genitourinary: Negative for dysuria, frequency, hematuria and urgency.  Musculoskeletal: Positive for back pain. Negative for falls and myalgias.  Skin: Negative for rash.  Neurological: Positive for sensory change and focal weakness. Negative for dizziness, loss of consciousness, weakness and headaches.  Endo/Heme/Allergies: Negative for environmental allergies. Bruises/bleeds easily.  Psychiatric/Behavioral: Negative for depression and suicidal ideas. The patient is not nervous/anxious and does not have insomnia.        Objective:    Physical Exam  Constitutional: She is oriented to person, place, and time. She appears well-developed and well-nourished. No distress.  HENT:  Head: Normocephalic and atraumatic.  Eyes: Conjunctivae are normal.  Neck: Neck supple. No thyromegaly present.  Cardiovascular: Normal rate, regular rhythm and normal heart sounds.   No murmur heard. Pulmonary/Chest: Effort normal and breath sounds normal. No respiratory distress.  Abdominal: Soft.  Bowel sounds are normal. She exhibits no distension and no mass. There is no tenderness.  Musculoskeletal: She exhibits no edema.  Lymphadenopathy:    She has no cervical adenopathy.  Neurological: She is alert and oriented to person, place, and time.  Skin: Skin is warm and dry.  Psychiatric: She has a normal mood and affect. Her behavior is normal.    BP 108/70 (BP Location: Left Arm, Patient Position: Sitting, Cuff Size: Large)   Pulse 78   Temp 98.1 F (36.7 C) (Oral)   Ht 5\' 3"  (1.6 m)   Wt 208 lb 2 oz (94.4 kg)   SpO2 94%   BMI 36.87 kg/m  Wt Readings from Last 3 Encounters:  02/09/16 208 lb 2 oz (94.4 kg)  08/26/15 220 lb (99.8 kg)  08/21/15 227 lb  2 oz (103 kg)     Lab Results  Component Value Date   WBC 4.9 02/03/2016   HGB 12.9 02/03/2016   HCT 38.5 02/03/2016   PLT 224.0 02/03/2016   GLUCOSE 94 02/03/2016   CHOL 190 02/03/2016   TRIG 94.0 02/03/2016   HDL 63.50 02/03/2016   LDLCALC 107 (H) 02/03/2016   ALT 14 02/03/2016   AST 17 02/03/2016   NA 139 02/03/2016   K 3.8 02/03/2016   CL 107 02/03/2016   CREATININE 0.65 02/03/2016   BUN 17 02/03/2016   CO2 25 02/03/2016   TSH 1.13 02/03/2016    Lab Results  Component Value Date   TSH 1.13 02/03/2016   Lab Results  Component Value Date   WBC 4.9 02/03/2016   HGB 12.9 02/03/2016   HCT 38.5 02/03/2016   MCV 86.9 02/03/2016   PLT 224.0 02/03/2016   Lab Results  Component Value Date   NA 139 02/03/2016   K 3.8 02/03/2016   CO2 25 02/03/2016   GLUCOSE 94 02/03/2016   BUN 17 02/03/2016   CREATININE 0.65 02/03/2016   BILITOT 0.9 02/03/2016   ALKPHOS 49 02/03/2016   AST 17 02/03/2016   ALT 14 02/03/2016   PROT 6.9 02/03/2016   ALBUMIN 3.8 02/03/2016   CALCIUM 9.3 02/03/2016   ANIONGAP 9 09/13/2014   GFR 121.43 02/03/2016   Lab Results  Component Value Date   CHOL 190 02/03/2016   Lab Results  Component Value Date   HDL 63.50 02/03/2016   Lab Results  Component Value Date   LDLCALC  107 (H) 02/03/2016   Lab Results  Component Value Date   TRIG 94.0 02/03/2016   Lab Results  Component Value Date   CHOLHDL 3 02/03/2016   No results found for: HGBA1C     Assessment & Plan:   Problem List Items Addressed This Visit    Hyperlipidemia    Encouraged heart healthy diet, increase exercise, avoid trans fats, consider a krill oil cap daily      Relevant Medications   triamterene-hydrochlorothiazide (MAXZIDE-25) 37.5-25 MG tablet   LOW BACK PAIN    Encouraged moist heat and gentle stretching as tolerated. May try NSAIDs and prescription meds as directed and report if symptoms worsen or seek immediate care. Rehab was helpful to some degree. Encouraged topical treatements and to stay active as able.       Relevant Medications   diclofenac (VOLTAREN) 75 MG EC tablet   methocarbamol (ROBAXIN) 500 MG tablet   Obesity    Encouraged DASH diet, decrease po intake and increase exercise as tolerated. Needs 7-8 hours of sleep nightly. Avoid trans fats, eat small, frequent meals every 4-5 hours with lean proteins, complex carbs and healthy fats. Minimize simple carbs      Essential hypertension, benign    Well controlled, no changes to meds. Encouraged heart healthy diet such as the DASH diet and exercise as tolerated.       Relevant Medications   triamterene-hydrochlorothiazide (MAXZIDE-25) 37.5-25 MG tablet   Preventative health care    Patient encouraged to maintain heart healthy diet, regular exercise, adequate sleep. Consider daily probiotics. Take medications as prescribed. Given and reviewed copy of ACP documents from [place] of State and encouraged to complete and return         I have changed Ms. Fullam's diclofenac. I am also having her maintain her OMEGA-3 KRILL OIL PO, cetirizine, multivitamin with minerals, Probiotic Product (PROBIOTIC PO), triamterene-hydrochlorothiazide, potassium chloride  SA, estrogens (conjugated), and methocarbamol.  Meds ordered  this encounter  Medications  . triamterene-hydrochlorothiazide (MAXZIDE-25) 37.5-25 MG tablet    Sig: Take 1 tablet by mouth daily.    Dispense:  90 tablet    Refill:  2  . potassium chloride SA (K-DUR,KLOR-CON) 20 MEQ tablet    Sig: TAKE 1 TABLET (20 MEQ TOTAL) BY MOUTH 2 (TWO) TIMES DAILY.    Dispense:  180 tablet    Refill:  2  . estrogens, conjugated, (PREMARIN) 0.9 MG tablet    Sig: Take 1 tablet (0.9 mg total) by mouth daily. Take daily for 21 days then do not take for 7 days.    Dispense:  70 tablet    Refill:  1  . diclofenac (VOLTAREN) 75 MG EC tablet    Sig: Take 1 tablet (75 mg total) by mouth 2 (two) times daily.    Dispense:  180 tablet    Refill:  2  . methocarbamol (ROBAXIN) 500 MG tablet    Sig: Take 1 tablet (500 mg total) by mouth every 6 (six) hours as needed for muscle spasms.    Dispense:  80 tablet    Refill:  1     Penni Homans, MD

## 2016-02-23 NOTE — Assessment & Plan Note (Signed)
Encouraged moist heat and gentle stretching as tolerated. May try NSAIDs and prescription meds as directed and report if symptoms worsen or seek immediate care. Rehab was helpful to some degree. Encouraged topical treatements and to stay active as able.

## 2016-02-23 NOTE — Assessment & Plan Note (Signed)
Patient encouraged to maintain heart healthy diet, regular exercise, adequate sleep. Consider daily probiotics. Take medications as prescribed. Given and reviewed copy of ACP documents from [place] Secretary of State and encouraged to complete and return 

## 2016-02-23 NOTE — Assessment & Plan Note (Signed)
Well controlled, no changes to meds. Encouraged heart healthy diet such as the DASH diet and exercise as tolerated.  °

## 2016-02-23 NOTE — Assessment & Plan Note (Signed)
Encouraged heart healthy diet, increase exercise, avoid trans fats, consider a krill oil cap daily 

## 2016-02-23 NOTE — Assessment & Plan Note (Signed)
Encouraged DASH diet, decrease po intake and increase exercise as tolerated. Needs 7-8 hours of sleep nightly. Avoid trans fats, eat small, frequent meals every 4-5 hours with lean proteins, complex carbs and healthy fats. Minimize simple carbs 

## 2016-03-01 MED FILL — PREMARIN 0.9 MG TABLET: 0.9 | 84 days supply | Qty: 63 | Fill #0

## 2016-03-15 DIAGNOSIS — Z6837 Body mass index (BMI) 37.0-37.9, adult: Secondary | ICD-10-CM | POA: Diagnosis not present

## 2016-03-15 DIAGNOSIS — Z01419 Encounter for gynecological examination (general) (routine) without abnormal findings: Secondary | ICD-10-CM | POA: Diagnosis not present

## 2016-03-15 LAB — HM PAP SMEAR: HM Pap smear: NORMAL

## 2016-03-18 ENCOUNTER — Ambulatory Visit (HOSPITAL_BASED_OUTPATIENT_CLINIC_OR_DEPARTMENT_OTHER)
Admission: RE | Admit: 2016-03-18 | Discharge: 2016-03-18 | Disposition: A | Discharge status: Home / Self Care | Payer: 59 | Source: Ambulatory Visit | Attending: Family Medicine | Admitting: Family Medicine

## 2016-03-18 DIAGNOSIS — Z1231 Encounter for screening mammogram for malignant neoplasm of breast: Secondary | ICD-10-CM | POA: Insufficient documentation

## 2016-03-19 MED FILL — TRIAMTERENE-HCTZ 37.5-25 MG: 37.5-25 | 90 days supply | Qty: 90 | Fill #0

## 2016-03-23 ENCOUNTER — Ambulatory Visit (INDEPENDENT_AMBULATORY_CARE_PROVIDER_SITE_OTHER): Payer: 59 | Admitting: Neurology

## 2016-03-23 ENCOUNTER — Other Ambulatory Visit (HOSPITAL_COMMUNITY)
Admission: RE | Admit: 2016-03-23 | Discharge: 2016-03-23 | Disposition: A | Discharge status: Home / Self Care | Payer: 59 | Source: Ambulatory Visit | Attending: Neurology | Admitting: Neurology

## 2016-03-23 ENCOUNTER — Encounter: Payer: Self-pay | Admitting: Neurology

## 2016-03-23 VITALS — BP 118/80 | HR 76 | Ht 63.0 in | Wt 212.4 lb

## 2016-03-23 DIAGNOSIS — M545 Low back pain: Secondary | ICD-10-CM

## 2016-03-23 DIAGNOSIS — R202 Paresthesia of skin: Secondary | ICD-10-CM | POA: Insufficient documentation

## 2016-03-23 DIAGNOSIS — Z981 Arthrodesis status: Secondary | ICD-10-CM | POA: Diagnosis not present

## 2016-03-23 LAB — VITAMIN B12: Vitamin B-12: 1457 pg/mL — ABNORMAL HIGH (ref 180–914)

## 2016-03-23 MED ORDER — NORTRIPTYLINE HCL 10 MG PO CAPS: ORAL_CAPSULE | ORAL | 5 refills | Status: DC

## 2016-03-23 MED FILL — NORTRIPTYLINE HCL 10 MG CAP: 10 | 30 days supply | Qty: 60 | Fill #0

## 2016-03-23 NOTE — Progress Notes (Signed)
[facility] Clinic Note - Initial Visit   Date: 03/23/16  ILAINA BOULER MRN: [id number] DOB: [birth year]   Dear Dr Vertell Limber:  Thank you for your kind referral of Sherri Sullivan for consultation of bilateral leg paresthesias and pain. Although her history is well known to you, please allow [name] to reiterate it for the purpose of our medical record. The patient was accompanied to the clinic by self.    History of Present Illness: Sherri Sullivan is a 55 y.o. right-handed African American female with hypertension, SVT, s/p lumbar fusion (2016) presenting for evaluation of bilateral leg pain and paresthesias..    For the past 3 years, she reports having stabbing pain over a localized area of her right lateral calf, which is tender to touch. She has benefit with diclofenac. She also complains of 1 year history of bilateral toe numbness, which is constant.  Since the summer 2017, she started having burning sensation of her buttocks and tingling sensation of over the posterior thighs and into to foot. The tingling is worse with prolonged standing, walking, and staying active.  She has tried physical therapy (October- November 2017) which provided mild benefit with her balance.  She currently walks unassisted, but reports that her husband often tells her to use a cane. She had electrodiagnostic testing at [facility] with Dr. Jannifer Franklin, which was did not show evidence of a large fiber neuropathy and did demonstrate a chronic S1 radiculopathy on the right.  She then followed up with Dr. Vertell Limber who had done her previous lumbar surgery who did not feel that her symptoms were stemming from her back and referred her for second opinion with me for neuropathy.   Out-side paper records, electronic medical record, and images have been reviewed where available and summarized as:  NCS/EMG of the legs 10/02/2015:  Nerve conduction studies done on both lower extremities were within normal limits. No evidence of  a peripheral neuropathy is seen. EMG evaluation of the right lower extremity was relatively unremarkable with exception of denervation seen in the lumbosacral paraspinal muscles. On the left side, there were findings in the leg of a very mild acute and chronic S1 radiculopathy, no other significant abnormalities were seen.  Lab Results  Component Value Date   TSH 1.13 02/03/2016     Past Medical History:  Diagnosis Date  . Anginal pain ([place])   . 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 Chicken pox as a child  . Dizziness   . Dysrhythmia 2013   SVT- treated by ablation by Dr. Lovena Le to f/u with as needed basis   . Encounter for preventative adult health care exam with abnormal findings 02/09/2015  . HTN (hypertension)   . Hypokalemia 02/09/2015  . Measles as a child  . Measles as a child  . Obesity   . Panic attack    during episode of feeling to crowded   . Preventative health care 02/09/2015  . SOB (shortness of breath) 09/23/11   "a little bit; at rest; before ablation", SOB again now (08/2014- due to lack  of exercise)   . SVT (supraventricular tachycardia) ([place])   . Uterine fibroid 12/17/2012   Cervical polyp per patient Follows with [facility], Dr Evette Cristal    Past Surgical History:  Procedure Laterality Date  . ABDOMINAL HYSTERECTOMY  1990's  . CARDIAC ELECTROPHYSIOLOGY STUDY AND ABLATION  09/23/11  . MAXIMUM ACCESS (MAS)POSTERIOR LUMBAR  INTERBODY FUSION (PLIF) 1 LEVEL N/A 09/24/2014   Procedure: L5-S1 MAS PLIF ;  Surgeon: Erline Levine, MD;  Location: [facility];  Service: Neurosurgery;  Laterality: N/A;  L5-S1 MAS PLIF fusion  . SUPRAVENTRICULAR TACHYCARDIA ABLATION N/A 09/23/2011   Procedure: SUPRAVENTRICULAR TACHYCARDIA ABLATION;  Surgeon: Evans Lance, MD;  Location: [facility] CATH LAB;  Service: Cardiovascular;  Laterality: N/A;  . TUBAL LIGATION  1980's     Medications:  Outpatient Encounter Prescriptions as  of 03/23/2016  Medication Sig Note  . cetirizine (ZYRTEC) 10 MG tablet Take 10 mg by mouth daily.   . diclofenac (VOLTAREN) 75 MG EC tablet Take 1 tablet (75 mg total) by mouth 2 (two) times daily.   Marland Kitchen estrogens, conjugated, (PREMARIN) 0.9 MG tablet Take 1 tablet (0.9 mg total) by mouth daily. Take daily for 21 days then do not take for 7 days.   . methocarbamol (ROBAXIN) 500 MG tablet Take 1 tablet (500 mg total) by mouth every 6 (six) hours as needed for muscle spasms.   . Multiple Vitamins-Minerals (MULTIVITAMIN WITH MINERALS) tablet Take 1 tablet by mouth 3 (three) times a week. 09/13/2014: No particular days of the week  . OMEGA-3 KRILL OIL PO Take 353 mg by mouth daily. [facility]   . potassium chloride SA (K-DUR,KLOR-CON) 20 MEQ tablet TAKE 1 TABLET (20 MEQ TOTAL) BY MOUTH 2 (TWO) TIMES DAILY.   . Probiotic Product (PROBIOTIC PO) Take 2 tablets by mouth at bedtime.   . triamterene-hydrochlorothiazide (MAXZIDE-25) 37.5-25 MG tablet Take 1 tablet by mouth daily.    No facility-administered encounter medications on file as of 03/23/2016.      Allergies: No Known Allergies  Family History: Family History  Problem Relation Age of Onset  . Cancer Sister     lung  . Diabetes Mother     type 2  . Heart disease Mother   . Heart disease Father   . Hypertension Daughter   . Stroke Maternal Grandmother   . Diabetes Maternal Aunt   . Kidney disease Maternal Aunt   . Diabetes Maternal Uncle     Social History: Social History  Substance Use Topics  . Smoking status: Former Smoker    Packs/day: 0.12    Years: 8.00    Types: Cigarettes    Quit date: 03/29/1984  . Smokeless tobacco: Never Used  . Alcohol use 8.4 oz/week    14 Glasses of wine per week   Social History   Social History Narrative   No major restrictions   Lives with husband, daughter, grand daughter in a 2 story home.    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    Review  of Systems:  CONSTITUTIONAL: No fevers, chills, night sweats, or weight loss.   EYES: No visual changes or eye pain ENT: No hearing changes.  No history of nose bleeds.   RESPIRATORY: No cough, wheezing and shortness of breath.   CARDIOVASCULAR: Negative for chest pain, and palpitations.   GI: Negative for abdominal discomfort, blood in stools or black stools.  No recent change in bowel habits.   GU:  No history of incontinence.   MUSCLOSKELETAL: +history of joint pain or swelling.  No myalgias.   SKIN: Negative for lesions, rash, and itching.   HEMATOLOGY/ONCOLOGY: Negative for prolonged bleeding, bruising easily, and swollen nodes.  No history of cancer.   ENDOCRINE: Negative for cold or heat intolerance, polydipsia or goiter.  PSYCH:  +depression or anxiety symptoms.   NEURO: As Above.   Vital Signs:  BP 118/80   Pulse 76   Ht 5\' 3"  (1.6 m)   Wt 212 lb 6 oz (96.3 kg)   SpO2 96%   BMI 37.62 kg/m     General Medical Exam:   General:  Well appearing, comfortable.   Eyes/ENT: see cranial nerve examination.   Neck: No masses appreciated.  Full range of motion without tenderness.  No carotid bruits. Respiratory:  Clear to auscultation, good air entry bilaterally.   Cardiac:  Regular rate and rhythm, no murmur.   Extremities:  No deformities, edema, or skin discoloration.  Skin:  No rashes or lesions.  Neurological Exam: MENTAL STATUS including orientation to time, place, person, recent and remote memory, attention span and concentration, language, and fund of knowledge is normal.  Speech is not dysarthric.  CRANIAL NERVES: II:  No visual field defects.  Unremarkable fundi.   III-IV-VI: Pupils equal round and reactive to light.  Normal conjugate, extra-ocular eye movements in all directions of gaze.  No nystagmus.  No ptosis.   V:  Normal facial sensation.     VII:  Normal facial symmetry and movements.   VIII:  Normal hearing and vestibular function.   IX-X:  Normal palatal  movement.   XI:  Normal shoulder shrug and head rotation.   XII:  Normal tongue strength and range of motion, no deviation or fasciculation.  MOTOR:  No atrophy, fasciculations or abnormal movements.  No pronator drift.  Tone is normal.    Right Upper Extremity:    Left Upper Extremity:    Deltoid  5/5   Deltoid  5/5   Biceps  5/5   Biceps  5/5   Triceps  5/5   Triceps  5/5   Wrist extensors  5/5   Wrist extensors  5/5   Wrist flexors  5/5   Wrist flexors  5/5   Finger extensors  5/5   Finger extensors  5/5   Finger flexors  5/5   Finger flexors  5/5   Dorsal interossei  5/5   Dorsal interossei  5/5   Abductor pollicis  5/5   Abductor pollicis  5/5   Tone (Ashworth scale)  0  Tone (Ashworth scale)  0   Right Lower Extremity:    Left Lower Extremity:    Hip flexors  5/5   Hip flexors  5/5   Hip extensors  5/5   Hip extensors  5/5   Knee flexors  5/5   Knee flexors  5/5   Knee extensors  5/5   Knee extensors  5/5   Dorsiflexors  5/5   Dorsiflexors  5/5   Plantarflexors  5/5   Plantarflexors  5/5   Toe extensors  5/5   Toe extensors  5/5   Toe flexors  5/5   Toe flexors  5/5   Tone (Ashworth scale)  0  Tone (Ashworth scale)  0   MSRs:  Right                                                                 Left brachioradialis 2+  brachioradialis 2+  biceps 2+  biceps 2+  triceps 2+  triceps 2+  patellar 3+  patellar 3+  ankle jerk 2+  ankle jerk 2+  Hoffman no  Hoffman no  plantar response down  plantar response down   SENSORY:  Normal and symmetric perception of light touch, pinprick, vibration, and proprioception.    COORDINATION/GAIT: Normal finger-to- nose-finger.  Intact rapid alternating movements bilaterally. Gait is antalgic and unassisted.   IMPRESSION: Mrs. Poitevint is a 55 year-old female referred for evaluation of bilateral lower extremity paresthesias and pain. With her symptoms involving her buttocks, thighs, and lower legs, it certainly does raise the possibility  of nerve impingement stemming from her lumbar region. She may also have some distal neuropathy in both feet, even though her electrodiagnostic testing was normal in June 2017.  I had a lengthy discussion regarding the options for further investigation including repeat nerve testing, skin biopsy for for small fiber neuropathy, or MRI lumbar spine. She is agreeable to proceeding with skin biopsy for small fiber testing, so I'll make a referral to podiatry for this procedure. If there is no evidence of small fiber neuropathy, she may need imaging of her lumbar spine to look for nerve compromise.   PLAN/RECOMMENDATIONS:  1.  Referral to podiatry to skin biopsy for small fiber neuropathy 2.  Check vitamin B12 and B1 3.  If there is no evidence of small fiber neuropathy, the next step will be MRI lumbar spine 4.  In the meantime, start nortriptyline 10 mg at bedtime for one month, then increase to 20 mg at bedtime  The duration of this appointment visit was 45 minutes of face-to-face time with the patient.  Greater than 50% of this time was spent in counseling, explanation of diagnosis, planning of further management, and coordination of care.   Thank you for allowing me to participate in patient's care.  If I can answer any additional questions, I would be pleased to do so.    Sincerely,    Donika K. Posey Pronto, DO

## 2016-03-23 NOTE — Patient Instructions (Addendum)
1.  Start nortriptyline 10mg  at bedtime for 1 month, then increase to 2 tablet at bedtime 2.  We will send a referral to podiatry for sking biopsy and they will call you with the appointment time 3.  Check vitamin B12 and B1 levels 4.  We will call you with the results and let you know the next step

## 2016-03-25 LAB — VITAMIN B1: VITAMIN B1 (THIAMINE): 175 nmol/L (ref 66.5–200.0)

## 2016-03-31 ENCOUNTER — Telehealth: Payer: Self-pay | Admitting: Neurology

## 2016-03-31 NOTE — Telephone Encounter (Signed)
Patient is still taking one nortriptylline at night for a few more days and then will increase to 2 at night.  She was wondering if she could still take her voltaren with these meds and I informed her that this is ok and she will probably see a difference when she starts the 2 nortriptylline qhs.

## 2016-03-31 NOTE — Telephone Encounter (Signed)
PT called and said her medication is not really working and has a question about taking another medication/Dawn CB# [phone]

## 2016-04-12 ENCOUNTER — Ambulatory Visit: Payer: 59 | Admitting: Neurology

## 2016-04-27 MED FILL — NORTRIPTYLINE HCL 10 MG CAP: 10 | 30 days supply | Qty: 60 | Fill #1

## 2016-04-28 DIAGNOSIS — M79672 Pain in left foot: Secondary | ICD-10-CM | POA: Diagnosis not present

## 2016-04-28 DIAGNOSIS — M79671 Pain in right foot: Secondary | ICD-10-CM | POA: Diagnosis not present

## 2016-04-28 DIAGNOSIS — G629 Polyneuropathy, unspecified: Secondary | ICD-10-CM | POA: Diagnosis not present

## 2016-04-28 DIAGNOSIS — G603 Idiopathic progressive neuropathy: Secondary | ICD-10-CM | POA: Diagnosis not present

## 2016-05-03 MED FILL — POTASSIUM CL ER 20 MEQ TABL: 20 | 30 days supply | Qty: 60 | Fill #1

## 2016-05-13 ENCOUNTER — Telehealth: Payer: Self-pay | Admitting: Neurology

## 2016-05-13 NOTE — Telephone Encounter (Signed)
PT called and wanted to know if her test results were available/Dawn CB# [phone]

## 2016-05-13 NOTE — Telephone Encounter (Signed)
Patient is calling back about test results please call [phone]

## 2016-05-13 NOTE — Telephone Encounter (Signed)
Patient was calling for dermatology results.  Informed her that they will call her.

## 2016-05-21 MED FILL — PREMARIN 1.25 MG TABLET: 1.25 | 30 days supply | Qty: 30 | Fill #0

## 2016-06-03 ENCOUNTER — Telehealth: Payer: Self-pay | Admitting: Neurology

## 2016-06-03 NOTE — Telephone Encounter (Signed)
Sherri Sullivan [birth year]. Her cell # is [phone] and her work # is [phone] [id number]. She was calling to see what her next step is at this point. She was mentioning results showing a pinched nerve? She would like you to please call her. Thank you

## 2016-06-03 NOTE — Telephone Encounter (Signed)
MRI lumbar spine wo contrast.  I had also sent her to podiatry for skin biopsy, but I don't see the results.  Did she have this done?  If so, please find out where so we can get the results.  Neila Teem K. Posey Pronto, DO

## 2016-06-03 NOTE — Telephone Encounter (Signed)
MRI mentioned in your last note.

## 2016-06-04 ENCOUNTER — Other Ambulatory Visit: Payer: Self-pay | Admitting: *Deleted

## 2016-06-04 DIAGNOSIS — M545 Low back pain, unspecified: Secondary | ICD-10-CM

## 2016-06-04 NOTE — Telephone Encounter (Signed)
Left message for patient that I will order the MRI and requested for her to let us know about the bx.

## 2016-06-09 ENCOUNTER — Telehealth: Payer: Self-pay | Admitting: Neurology

## 2016-06-09 NOTE — Telephone Encounter (Signed)
PT returned your call/Dawn CB# [phone] (W)          [phone] (C)

## 2016-06-09 NOTE — Telephone Encounter (Signed)
I spoke with patient and she did have bx done.  [place] should be faxing report to [name] today.

## 2016-06-14 ENCOUNTER — Telehealth: Payer: Self-pay | Admitting: Neurology

## 2016-06-14 NOTE — Telephone Encounter (Signed)
Please inform patient that her skin biopsy results are normal (04/28/2016).  No evidence of small fiber neuropathy.  We will call her with the results of her MRI lumbar spine, once it is available to [name].  Donika K. Posey Pronto, DO

## 2016-06-15 ENCOUNTER — Encounter: Payer: Self-pay | Admitting: *Deleted

## 2016-06-15 NOTE — Telephone Encounter (Signed)
Results sent via My Chart.  

## 2016-06-19 ENCOUNTER — Ambulatory Visit
Admission: RE | Admit: 2016-06-19 | Discharge: 2016-06-19 | Disposition: A | Discharge status: Home / Self Care | Payer: 59 | Source: Ambulatory Visit | Attending: Neurology | Admitting: Neurology

## 2016-06-19 DIAGNOSIS — M545 Low back pain, unspecified: Secondary | ICD-10-CM

## 2016-06-19 DIAGNOSIS — M5126 Other intervertebral disc displacement, lumbar region: Secondary | ICD-10-CM | POA: Diagnosis not present

## 2016-06-21 MED FILL — TRIAMTERENE-HCTZ 37.5-25 MG: 37.5-25 | 90 days supply | Qty: 90 | Fill #1

## 2016-06-21 MED FILL — POTASSIUM CL ER 20 MEQ TABL: 20 | 30 days supply | Qty: 60 | Fill #2

## 2016-06-28 MED FILL — PREMARIN 1.25 MG TABLET: 1.25 | 30 days supply | Qty: 30 | Fill #1

## 2016-06-28 MED FILL — DICLOFENAC SOD 75 MG TAB EC: 75 | 90 days supply | Qty: 180 | Fill #1

## 2016-06-28 MED FILL — METHOCARBAMOL 500 MG TABLET: 500 | 20 days supply | Qty: 80 | Fill #1

## 2016-07-28 MED FILL — PREMARIN 1.25 MG TABLET: 1.25 | 90 days supply | Qty: 90 | Fill #2

## 2016-08-02 ENCOUNTER — Other Ambulatory Visit: Payer: Self-pay | Admitting: Family Medicine

## 2016-08-02 MED FILL — POTASSIUM CL ER 20 MEQ TABL: 20 | 90 days supply | Qty: 180 | Fill #3

## 2016-08-02 MED FILL — METHOCARBAMOL 500 MG TABLET: 500 | 20 days supply | Qty: 80 | Fill #0

## 2016-08-03 DIAGNOSIS — H5203 Hypermetropia, bilateral: Secondary | ICD-10-CM | POA: Diagnosis not present

## 2016-08-03 DIAGNOSIS — H524 Presbyopia: Secondary | ICD-10-CM | POA: Diagnosis not present

## 2016-08-03 DIAGNOSIS — H52203 Unspecified astigmatism, bilateral: Secondary | ICD-10-CM | POA: Diagnosis not present

## 2016-08-09 ENCOUNTER — Ambulatory Visit (INDEPENDENT_AMBULATORY_CARE_PROVIDER_SITE_OTHER): Payer: 59 | Admitting: Family Medicine

## 2016-08-09 ENCOUNTER — Encounter: Payer: Self-pay | Admitting: Family Medicine

## 2016-08-09 ENCOUNTER — Ambulatory Visit: Payer: 59 | Admitting: Family Medicine

## 2016-08-09 VITALS — BP 128/78 | HR 80 | Temp 98.4°F | Resp 18 | Wt 222.4 lb

## 2016-08-09 DIAGNOSIS — M5442 Lumbago with sciatica, left side: Secondary | ICD-10-CM | POA: Diagnosis not present

## 2016-08-09 DIAGNOSIS — G8929 Other chronic pain: Secondary | ICD-10-CM

## 2016-08-09 DIAGNOSIS — E785 Hyperlipidemia, unspecified: Secondary | ICD-10-CM

## 2016-08-09 DIAGNOSIS — I10 Essential (primary) hypertension: Secondary | ICD-10-CM

## 2016-08-09 DIAGNOSIS — Z23 Encounter for immunization: Secondary | ICD-10-CM

## 2016-08-09 DIAGNOSIS — E669 Obesity, unspecified: Secondary | ICD-10-CM | POA: Diagnosis not present

## 2016-08-09 DIAGNOSIS — M5441 Lumbago with sciatica, right side: Secondary | ICD-10-CM | POA: Diagnosis not present

## 2016-08-09 NOTE — Patient Instructions (Signed)
DASH or MIND diet DASH Eating Plan DASH stands for "Dietary Approaches to Stop Hypertension." The DASH eating plan is a healthy eating plan that has been shown to reduce high blood pressure (hypertension). It may also reduce your risk for type 2 diabetes, heart disease, and stroke. The DASH eating plan may also help with weight loss. What are tips for following this plan? General guidelines   Avoid eating more than 2,300 mg (milligrams) of salt (sodium) a day. If you have hypertension, you may need to reduce your sodium intake to 1,500 mg a day.  Limit alcohol intake to no more than 1 drink a day for nonpregnant women and 2 drinks a day for men. One drink equals 12 oz of beer, 5 oz of wine, or 1 oz of hard liquor.  Work with your health care provider to maintain a healthy body weight or to lose weight. Ask what an ideal weight is for you.  Get at least 30 minutes of exercise that causes your heart to beat faster (aerobic exercise) most days of the week. Activities may include walking, swimming, or biking.  Work with your health care provider or diet and nutrition specialist (dietitian) to adjust your eating plan to your individual calorie needs. Reading food labels   Check food labels for the amount of sodium per serving. Choose foods with less than 5 percent of the Daily Value of sodium. Generally, foods with less than 300 mg of sodium per serving fit into this eating plan.  To find whole grains, look for the word "whole" as the first word in the ingredient list. Shopping   Buy products labeled as "low-sodium" or "no salt added."  Buy fresh foods. Avoid canned foods and premade or frozen meals. Cooking   Avoid adding salt when cooking. Use salt-free seasonings or herbs instead of table salt or sea salt. Check with your health care provider or pharmacist before using salt substitutes.  Do not fry foods. Cook foods using healthy methods such as baking, boiling, grilling, and broiling  instead.  Cook with heart-healthy oils, such as olive, canola, soybean, or sunflower oil. Meal planning    Eat a balanced diet that includes:  5 or more servings of fruits and vegetables each day. At each meal, try to fill half of your plate with fruits and vegetables.  Up to 6-8 servings of whole grains each day.  Less than 6 oz of lean meat, poultry, or fish each day. A 3-oz serving of meat is about the same size as a deck of cards. One egg equals 1 oz.  2 servings of low-fat dairy each day.  A serving of nuts, seeds, or beans 5 times each week.  Heart-healthy fats. Healthy fats called Omega-3 fatty acids are found in foods such as flaxseeds and coldwater fish, like sardines, salmon, and mackerel.  Limit how much you eat of the following:  Canned or prepackaged foods.  Food that is high in trans fat, such as fried foods.  Food that is high in saturated fat, such as fatty meat.  Sweets, desserts, sugary drinks, and other foods with added sugar.  Full-fat dairy products.  Do not salt foods before eating.  Try to eat at least 2 vegetarian meals each week.  Eat more home-cooked food and less restaurant, buffet, and fast food.  When eating at a restaurant, ask that your food be prepared with less salt or no salt, if possible. What foods are recommended? The items listed may not be  a complete list. Talk with your dietitian about what dietary choices are best for you. Grains  Whole-grain or whole-wheat bread. Whole-grain or whole-wheat pasta. Brown rice. Modena Morrow. Bulgur. Whole-grain and low-sodium cereals. Pita bread. Low-fat, low-sodium crackers. Whole-wheat flour tortillas. Vegetables  Fresh or frozen vegetables (raw, steamed, roasted, or grilled). Low-sodium or reduced-sodium tomato and vegetable juice. Low-sodium or reduced-sodium tomato sauce and tomato paste. Low-sodium or reduced-sodium canned vegetables. Fruits  All fresh, dried, or frozen fruit. Canned fruit  in natural juice (without added sugar). Meat and other protein foods  Skinless chicken or [place]. Ground chicken or [place]. Pork with fat trimmed off. Fish and seafood. Egg whites. Dried beans, peas, or lentils. Unsalted nuts, nut butters, and seeds. Unsalted canned beans. Lean cuts of beef with fat trimmed off. Low-sodium, lean deli meat. Dairy  Low-fat (1%) or fat-free (skim) milk. Fat-free, low-fat, or reduced-fat cheeses. Nonfat, low-sodium ricotta or cottage cheese. Low-fat or nonfat yogurt. Low-fat, low-sodium cheese. Fats and oils  Soft margarine without trans fats. Vegetable oil. Low-fat, reduced-fat, or light mayonnaise and salad dressings (reduced-sodium). Canola, safflower, olive, soybean, and sunflower oils. Avocado. Seasoning and other foods  Herbs. Spices. Seasoning mixes without salt. Unsalted popcorn and pretzels. Fat-free sweets. What foods are not recommended? The items listed may not be a complete list. Talk with your dietitian about what dietary choices are best for you. Grains  Baked goods made with fat, such as croissants, muffins, or some breads. Dry pasta or rice meal packs. Vegetables  Creamed or fried vegetables. Vegetables in a cheese sauce. Regular canned vegetables (not low-sodium or reduced-sodium). Regular canned tomato sauce and paste (not low-sodium or reduced-sodium). Regular tomato and vegetable juice (not low-sodium or reduced-sodium). Angie Fava. Olives. Fruits  Canned fruit in a light or heavy syrup. Fried fruit. Fruit in cream or butter sauce. Meat and other protein foods  Fatty cuts of meat. Ribs. Fried meat. Berniece Salines. Sausage. Bologna and other processed lunch meats. Salami. Fatback. Hotdogs. Bratwurst. Salted nuts and seeds. Canned beans with added salt. Canned or smoked fish. Whole eggs or egg yolks. Chicken or [place] with skin. Dairy  Whole or 2% milk, cream, and half-and-half. Whole or full-fat cream cheese. Whole-fat or sweetened yogurt. Full-fat cheese.  Nondairy creamers. Whipped toppings. Processed cheese and cheese spreads. Fats and oils  Butter. Stick margarine. Lard. Shortening. Ghee. Bacon fat. Tropical oils, such as coconut, palm kernel, or palm oil. Seasoning and other foods  Salted popcorn and pretzels. Onion salt, garlic salt, seasoned salt, table salt, and sea salt. Worcestershire sauce. Tartar sauce. Barbecue sauce. Teriyaki sauce. Soy sauce, including reduced-sodium. Steak sauce. Canned and packaged gravies. Fish sauce. Oyster sauce. Cocktail sauce. Horseradish that you find on the shelf. Ketchup. Mustard. Meat flavorings and tenderizers. Bouillon cubes. Hot sauce and Tabasco sauce. Premade or packaged marinades. Premade or packaged taco seasonings. Relishes. Regular salad dressings. Where to find more information:  National Heart, Lung, and [place]: https://wilson-eaton.com/  American Heart Association: www.heart.org Summary  The DASH eating plan is a healthy eating plan that has been shown to reduce high blood pressure (hypertension). It may also reduce your risk for type 2 diabetes, heart disease, and stroke.  With the DASH eating plan, you should limit salt (sodium) intake to 2,300 mg a day. If you have hypertension, you may need to reduce your sodium intake to 1,500 mg a day.  When on the DASH eating plan, aim to eat more fresh fruits and vegetables, whole grains, lean proteins, low-fat dairy, and  heart-healthy fats.  Work with your health care provider or diet and nutrition specialist (dietitian) to adjust your eating plan to your individual calorie needs. This information is not intended to replace advice given to you by your health care provider. Make sure you discuss any questions you have with your health care provider. Document Released: 03/04/2011 Document Revised: 03/08/2016 Document Reviewed: 03/08/2016 Elsevier Interactive Patient Education  2017 [employer].

## 2016-08-09 NOTE — Assessment & Plan Note (Signed)
Well controlled, no changes to meds. Encouraged heart healthy diet such as the DASH diet and exercise as tolerated.  °

## 2016-08-09 NOTE — Progress Notes (Signed)
Subjective:  I acted as a Education administrator for Dr. Charlett Blake. [name], [place]  Patient ID: Sherri Sullivan, female    DOB: 02-18-1961, 56 y.o.   MRN: [id number]  Chief Complaint  Patient presents with  . Follow-up  . Hypertension  . Hyperlipidemia    HPI  Patient is in today for a 6 month follow up following up on HTN, hyperlipidemia and other medical concerns. Patient has no acute concerns. She feels well most days. Has some intermittent back pain but no recent fall or injury. Denies CP/palp/SOB/HA/congestion/fevers/GI or GU c/o. Taking meds as prescribed  Patient Care Team: Mosie Lukes, MD as PCP - General (Family Medicine) Dene Gentry, MD as Consulting Physician (Sports Medicine) Erline Levine, MD as Consulting Physician (Neurosurgery)   Past Medical History:  Diagnosis Date  . Anginal pain ([place])   . 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 Chicken pox as a child  . Dizziness   . Dysrhythmia 2013   SVT- treated by ablation by Dr. Lovena Le to f/u with as needed basis   . Encounter for preventative adult health care exam with abnormal findings 02/09/2015  . HTN (hypertension)   . Hypokalemia 02/09/2015  . Measles as a child  . Measles as a child  . Obesity   . Panic attack    during episode of feeling to crowded   . Preventative health care 02/09/2015  . SOB (shortness of breath) 09/23/11   "a little bit; at rest; before ablation", SOB again now (08/2014- due to lack  of exercise)   . SVT (supraventricular tachycardia) ([place])   . Uterine fibroid 12/17/2012   Cervical polyp per patient Follows with [facility], Dr Evette Cristal    Past Surgical History:  Procedure Laterality Date  . ABDOMINAL HYSTERECTOMY  1990's  . CARDIAC ELECTROPHYSIOLOGY STUDY AND ABLATION  09/23/11  . MAXIMUM ACCESS (MAS)POSTERIOR LUMBAR INTERBODY FUSION (PLIF) 1 LEVEL N/A 09/24/2014   Procedure: L5-S1 MAS PLIF ;  Surgeon: Erline Levine, MD;  Location: [facility];  Service: Neurosurgery;  Laterality: N/A;  L5-S1 MAS PLIF fusion  . SUPRAVENTRICULAR TACHYCARDIA ABLATION N/A 09/23/2011   Procedure: SUPRAVENTRICULAR TACHYCARDIA ABLATION;  Surgeon: Evans Lance, MD;  Location: [facility] CATH LAB;  Service: Cardiovascular;  Laterality: N/A;  . TUBAL LIGATION  1980's    Family History  Problem Relation Age of Onset  . Cancer Sister        lung  . Diabetes Mother        type 2  . Heart disease Mother   . Heart disease Father   . Hypertension Daughter   . Stroke Maternal Grandmother   . Diabetes Maternal Aunt   . Kidney disease Maternal Aunt   . Diabetes Maternal Uncle     Social History   Social History  . Marital status: Married    Spouse name: N/A  . Number of children: N/A  . Years of education: N/A   Occupational History  . Not on file.   Social History Main Topics  . Smoking status: Former Smoker    Packs/day: 0.12    Years: 8.00    Types: Cigarettes    Quit date: 03/29/1984  . Smokeless tobacco: Never Used  . Alcohol use 8.4 oz/week    14 Glasses of wine per week     Comment: Drinks 2-3 glasses wine on the weekends  . Drug use: No  . Sexual  activity: Not Currently   Other Topics Concern  . Not on file   Social History Narrative   No major restrictions   Lives with husband, daughter, grand daughter in a 2 story home.    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    Outpatient Medications Prior to Visit  Medication Sig Dispense Refill  . cetirizine (ZYRTEC) 10 MG tablet Take 10 mg by mouth daily.    . diclofenac (VOLTAREN) 75 MG EC tablet Take 1 tablet (75 mg total) by mouth 2 (two) times daily. 180 tablet 2  . estrogens, conjugated, (PREMARIN) 0.9 MG tablet Take 1 tablet (0.9 mg total) by mouth daily. Take daily for 21 days then do not take for 7 days. 70 tablet 1  . methocarbamol (ROBAXIN) 500 MG tablet TAKE 1 TABLET (500 MG TOTAL) BY MOUTH EVERY 6 (SIX) HOURS AS NEEDED FOR MUSCLE  SPASMS. 80 tablet 1  . Multiple Vitamins-Minerals (MULTIVITAMIN WITH MINERALS) tablet Take 1 tablet by mouth 3 (three) times a week.    . nortriptyline (PAMELOR) 10 MG capsule Start nortriptyline 10mg  at bedtime for 2 week, then increase to 2 tablet at bedtime 60 capsule 5  . OMEGA-3 KRILL OIL PO Take 353 mg by mouth daily. [facility] [facility]    . potassium chloride SA (K-DUR,KLOR-CON) 20 MEQ tablet TAKE 1 TABLET (20 MEQ TOTAL) BY MOUTH 2 (TWO) TIMES DAILY. 180 tablet 2  . Probiotic Product (PROBIOTIC PO) Take 2 tablets by mouth at bedtime.    . triamterene-hydrochlorothiazide (MAXZIDE-25) 37.5-25 MG tablet Take 1 tablet by mouth daily. 90 tablet 2   No facility-administered medications prior to visit.     No Known Allergies  Review of Systems  Constitutional: Negative for fever and malaise/fatigue.  HENT: Negative for congestion.   Eyes: Negative for blurred vision.  Respiratory: Negative for cough and shortness of breath.   Cardiovascular: Negative for chest pain, palpitations and leg swelling.  Gastrointestinal: Negative for vomiting.  Musculoskeletal: Positive for back pain.  Skin: Negative for rash.  Neurological: Negative for loss of consciousness and headaches.       Objective:    Physical Exam  Constitutional: She is oriented to person, place, and time. She appears well-developed and well-nourished. No distress.  HENT:  Head: Normocephalic and atraumatic.  Eyes: Conjunctivae are normal.  Neck: Normal range of motion. No thyromegaly present.  Cardiovascular: Normal rate and regular rhythm.   Pulmonary/Chest: Effort normal and breath sounds normal. She has no wheezes.  Abdominal: Soft. Bowel sounds are normal. There is no tenderness.  Musculoskeletal: Normal range of motion. She exhibits no edema or deformity.  Neurological: She is alert and oriented to person, place, and time.  Skin: Skin is warm and dry. She is not diaphoretic.  Psychiatric: She has a normal mood and  affect.    BP 128/78 (BP Location: Left Arm, Patient Position: Sitting, Cuff Size: Normal)   Pulse 80   Temp 98.4 F (36.9 C) (Oral)   Resp 18   Wt 222 lb 6.4 oz (100.9 kg)   SpO2 97%   BMI 39.40 kg/m  Wt Readings from Last 3 Encounters:  08/09/16 222 lb 6.4 oz (100.9 kg)  03/23/16 212 lb 6 oz (96.3 kg)  02/09/16 208 lb 2 oz (94.4 kg)   BP Readings from Last 3 Encounters:  08/09/16 128/78  03/23/16 118/80  02/09/16 108/70     Immunization History  Administered Date(s) Administered  .  Influenza-Unspecified 12/27/2013, 12/25/2014, 12/22/2015  . Pneumococcal Conjugate-13 04/12/2013  . Tdap 03/30/2007  . Zoster Recombinat (Shingrix) 08/09/2016    Health Maintenance  Topic Date Due  . Hepatitis C Screening  October 22, 1960  . HIV Screening  06/27/1975  . PAP SMEAR  01/28/2015  . INFLUENZA VACCINE  10/27/2016  . TETANUS/TDAP  03/29/2017  . COLONOSCOPY  05/27/2017  . MAMMOGRAM  03/18/2018    Lab Results  Component Value Date   WBC 4.9 02/03/2016   HGB 12.9 02/03/2016   HCT 38.5 02/03/2016   PLT 224.0 02/03/2016   GLUCOSE 94 02/03/2016   CHOL 190 02/03/2016   TRIG 94.0 02/03/2016   HDL 63.50 02/03/2016   LDLCALC 107 (H) 02/03/2016   ALT 14 02/03/2016   AST 17 02/03/2016   NA 139 02/03/2016   K 3.8 02/03/2016   CL 107 02/03/2016   CREATININE 0.65 02/03/2016   BUN 17 02/03/2016   CO2 25 02/03/2016   TSH 1.13 02/03/2016    Lab Results  Component Value Date   TSH 1.13 02/03/2016   Lab Results  Component Value Date   WBC 4.9 02/03/2016   HGB 12.9 02/03/2016   HCT 38.5 02/03/2016   MCV 86.9 02/03/2016   PLT 224.0 02/03/2016   Lab Results  Component Value Date   NA 139 02/03/2016   K 3.8 02/03/2016   CO2 25 02/03/2016   GLUCOSE 94 02/03/2016   BUN 17 02/03/2016   CREATININE 0.65 02/03/2016   BILITOT 0.9 02/03/2016   ALKPHOS 49 02/03/2016   AST 17 02/03/2016   ALT 14 02/03/2016   PROT 6.9 02/03/2016   ALBUMIN 3.8 02/03/2016   CALCIUM 9.3 02/03/2016     ANIONGAP 9 09/13/2014   GFR 121.43 02/03/2016   Lab Results  Component Value Date   CHOL 190 02/03/2016   Lab Results  Component Value Date   HDL 63.50 02/03/2016   Lab Results  Component Value Date   LDLCALC 107 (H) 02/03/2016   Lab Results  Component Value Date   TRIG 94.0 02/03/2016   Lab Results  Component Value Date   CHOLHDL 3 02/03/2016   No results found for: HGBA1C       Assessment & Plan:   Problem List Items Addressed This Visit    Hyperlipidemia    Encouraged heart healthy diet, increase exercise, avoid trans fats, consider a krill oil cap daily      LOW BACK PAIN    Encouraged moist heat and gentle stretching as tolerated. May try NSAIDs and prescription meds as directed and report if symptoms worsen or seek immediate care      Obesity    Encouraged DASH diet, decrease po intake and increase exercise as tolerated. Needs 7-8 hours of sleep nightly. Avoid trans fats, eat small, frequent meals every 4-5 hours with lean proteins, complex carbs and healthy fats. Minimize simple carbs. Bariatrics referral       Essential hypertension, benign - Primary    Well controlled, no changes to meds. Encouraged heart healthy diet such as the DASH diet and exercise as tolerated.        Other Visit Diagnoses    Need for shingles vaccine       Relevant Orders   Varicella-zoster vaccine IM (Shingrix) (Completed)      I am having Ms. Hesler maintain her OMEGA-3 KRILL OIL PO, cetirizine, multivitamin with minerals, Probiotic Product (PROBIOTIC PO), triamterene-hydrochlorothiazide, potassium chloride SA, estrogens (conjugated), diclofenac, nortriptyline, methocarbamol, and PREMARIN.  Meds ordered  this encounter  Medications  . PREMARIN 1.25 MG tablet    Sig: Take 1 tablet by mouth daily.    Refill:  12    CMA served as scribe during this visit. History, Physical and Plan performed by medical provider. Documentation and orders reviewed and attested to.  Penni Homans, MD

## 2016-08-09 NOTE — Assessment & Plan Note (Signed)
Encouraged moist heat and gentle stretching as tolerated. May try NSAIDs and prescription meds as directed and report if symptoms worsen or seek immediate care 

## 2016-08-09 NOTE — Assessment & Plan Note (Signed)
Encouraged DASH diet, decrease po intake and increase exercise as tolerated. Needs 7-8 hours of sleep nightly. Avoid trans fats, eat small, frequent meals every 4-5 hours with lean proteins, complex carbs and healthy fats. Minimize simple carbs. Bariatrics referral

## 2016-08-09 NOTE — Assessment & Plan Note (Signed)
Encouraged heart healthy diet, increase exercise, avoid trans fats, consider a krill oil cap daily 

## 2016-09-20 MED FILL — TRIAMTERENE-HCTZ 37.5-25 MG: 37.5-25 | 90 days supply | Qty: 90 | Fill #2

## 2016-10-04 ENCOUNTER — Telehealth: Payer: Self-pay

## 2016-10-04 NOTE — Telephone Encounter (Signed)
Called patient to advise we are out of Shingrix immunizations. Left message on answering machine of cell phone number listed.

## 2016-10-11 ENCOUNTER — Telehealth: Payer: Self-pay

## 2016-10-11 NOTE — Telephone Encounter (Signed)
Cancelled appointment for Shingrix injection.

## 2016-10-12 ENCOUNTER — Ambulatory Visit: Payer: 59

## 2016-10-13 DIAGNOSIS — Z0271 Encounter for disability determination: Secondary | ICD-10-CM

## 2016-10-14 ENCOUNTER — Encounter: Payer: Self-pay | Admitting: Family Medicine

## 2016-11-10 MED FILL — PREMARIN 1.25 MG TABLET: 1.25 | 30 days supply | Qty: 30 | Fill #3

## 2016-11-16 MED FILL — POTASSIUM CL ER 20 MEQ TABL: 20 | 90 days supply | Qty: 180 | Fill #0

## 2016-11-24 ENCOUNTER — Other Ambulatory Visit: Payer: Self-pay

## 2016-11-24 MED ORDER — POTASSIUM CHLORIDE CRYS ER 20 MEQ PO TBCR: EXTENDED_RELEASE_TABLET | ORAL | 0 refills | Status: DC

## 2016-11-24 MED ORDER — METHOCARBAMOL 500 MG PO TABS: 500.0000 mg | ORAL_TABLET | Freq: Four times a day (QID) | ORAL | 0 refills | Status: DC | PRN

## 2016-11-24 MED ORDER — DICLOFENAC SODIUM 75 MG PO TBEC: 75.0000 mg | DELAYED_RELEASE_TABLET | Freq: Two times a day (BID) | ORAL | 0 refills | Status: DC

## 2016-11-24 MED ORDER — TRIAMTERENE-HCTZ 37.5-25 MG PO TABS: 1.0000 | ORAL_TABLET | Freq: Every day | ORAL | 0 refills | Status: DC

## 2016-12-09 ENCOUNTER — Telehealth: Payer: Self-pay | Admitting: *Deleted

## 2016-12-09 NOTE — Telephone Encounter (Signed)
Received request for Medical Records from [facility] via [employer] Request; forwarded to [place] for email/scan/SLS 09/13

## 2016-12-10 ENCOUNTER — Telehealth: Payer: Self-pay

## 2016-12-10 NOTE — Telephone Encounter (Signed)
Please call patient and schedule a nurse visit for patient to receive 2nd dose of shingrix by November 3rd.

## 2016-12-14 NOTE — Telephone Encounter (Signed)
Pt has been scheduled.  °

## 2016-12-16 ENCOUNTER — Ambulatory Visit (INDEPENDENT_AMBULATORY_CARE_PROVIDER_SITE_OTHER): Payer: BLUE CROSS/BLUE SHIELD | Admitting: Behavioral Health

## 2016-12-16 DIAGNOSIS — Z23 Encounter for immunization: Secondary | ICD-10-CM

## 2016-12-16 NOTE — Progress Notes (Signed)
Pre visit review using our clinic review tool, if applicable. No additional management support is needed unless otherwise documented below in the visit note.  Patient came in office today for 2nd Shingrix vaccination. IM injection was given in the left deltoid. Patient tolerated injection well. No s/s of a reaction before leaving the nurse visit.

## 2017-01-03 ENCOUNTER — Other Ambulatory Visit: Payer: Self-pay | Admitting: Family Medicine

## 2017-01-11 ENCOUNTER — Encounter (INDEPENDENT_AMBULATORY_CARE_PROVIDER_SITE_OTHER): Payer: Self-pay

## 2017-01-18 ENCOUNTER — Ambulatory Visit (INDEPENDENT_AMBULATORY_CARE_PROVIDER_SITE_OTHER): Payer: BLUE CROSS/BLUE SHIELD | Admitting: Family Medicine

## 2017-01-18 ENCOUNTER — Encounter (INDEPENDENT_AMBULATORY_CARE_PROVIDER_SITE_OTHER): Payer: Self-pay | Admitting: Family Medicine

## 2017-01-18 VITALS — BP 124/71 | HR 74 | Temp 97.4°F | Ht 63.0 in | Wt 227.0 lb

## 2017-01-18 DIAGNOSIS — Z1331 Encounter for screening for depression: Secondary | ICD-10-CM

## 2017-01-18 DIAGNOSIS — R5383 Other fatigue: Secondary | ICD-10-CM

## 2017-01-18 DIAGNOSIS — Z6841 Body Mass Index (BMI) 40.0 and over, adult: Secondary | ICD-10-CM

## 2017-01-18 DIAGNOSIS — R0602 Shortness of breath: Secondary | ICD-10-CM

## 2017-01-18 DIAGNOSIS — I10 Essential (primary) hypertension: Secondary | ICD-10-CM | POA: Diagnosis not present

## 2017-01-18 DIAGNOSIS — Z0289 Encounter for other administrative examinations: Secondary | ICD-10-CM

## 2017-01-18 NOTE — Progress Notes (Signed)
Office: [phone]  /  Fax: [phone]   Dear Dr. Charlett Blake,   Thank you for referring [name] to our clinic. The following note includes my evaluation and treatment recommendations.  HPI:   Chief Complaint: OBESITY    Sherri Sullivan has been referred by Erline Levine A. Charlett Blake, MD for consultation regarding her obesity and obesity related comorbidities.    Sherri Sullivan (MR# [id number]) is a 56 y.o. female who presents on 01/18/2017 for obesity evaluation and treatment. Current BMI is Body mass index is 40.21 kg/m.Sherri Sullivan has been struggling with her weight for many years and has been unsuccessful in either losing weight, maintaining weight loss, or reaching her healthy weight goal.     [name] attended our information session and states she is currently in the action stage of change and ready to dedicate time achieving and maintaining a healthier weight. Sherri Sullivan is interested in becoming our patient and working on intensive lifestyle modifications including (but not limited to) diet, exercise and weight loss.    Sherri Sullivan states her family eats meals together she thinks her family will eat healthier with  her her desired weight loss is 54 lbs she has been heavy most of  her life she started gaining weight at 56 yrs old her heaviest weight ever was 230 lbs. she has significant food cravings issues  she snacks frequently in the evenings she skips meals frequently she is frequently drinking liquids with calories she frequently eats larger portions than normal  she struggles with emotional eating    [name] feels her energy is lower than it should be. This has worsened with weight gain and has not worsened recently. [name] admits to daytime somnolence and  denies waking up still tired. Patient is at risk for obstructive sleep apnea. Patent has a history of symptoms of daytime fatigue. Patient generally gets 9 hours of sleep per night, and states they generally have restful sleep. Snoring is  present. Apneic episodes are present. Epworth Sleepiness Score is 9  Dyspnea on exertion Sherri Sullivan notes increasing shortness of breath with exercising and seems to be worsening over time with weight gain. She notes getting out of breath sooner with activity than she used to. This has not gotten worse recently. [name] denies orthopnea.  Hypertension Sherri Sullivan is a 56 y.o. female with hypertension and is currently taking Maxzide. She would like to eventually be able to control her hypertension with diet and exercise. Sherri Sullivan denies chest pain. She is attempting weight loss to help control her blood pressure with the goal of decreasing her risk of heart attack and stroke. Sherri Sullivan blood pressure is controlled today.  Depression Screen [name]'s Food and Mood (modified PHQ-9) score was  Depression screen PHQ 2/9 01/18/2017  Decreased Interest 1  Down, Depressed, Hopeless 0  PHQ - 2 Score 1  Altered sleeping 0  Tired, decreased energy 1  Change in appetite 1  Feeling bad or failure about yourself  0  Trouble concentrating 0  Moving slowly or fidgety/restless 3  Suicidal thoughts 0  PHQ-9 Score 6  Difficult doing work/chores Somewhat difficult    ALLERGIES: No Known Allergies  MEDICATIONS: Current Outpatient Prescriptions on File Prior to Visit  Medication Sig Dispense Refill   cetirizine (ZYRTEC) 10 MG tablet Take 10 mg by mouth daily.     diclofenac (VOLTAREN) 75 MG EC tablet TAKE 1 TABLET BY MOUTH TWO  TIMES DAILY 180 tablet 0   methocarbamol (ROBAXIN) 500 MG tablet  Take 1 tablet (500 mg total) by mouth every 6 (six) hours as needed for muscle spasms. 90 tablet 0   OMEGA-3 KRILL OIL PO Take 353 mg by mouth daily. [facility]     potassium chloride SA (K-DUR,KLOR-CON) 20 MEQ tablet TAKE 1 TABLET (20 MEQ TOTAL) BY MOUTH 2 (TWO) TIMES DAILY. 180 tablet 0   Probiotic Product (PROBIOTIC PO) Take 2 tablets by mouth at bedtime.     triamterene-hydrochlorothiazide (MAXZIDE-25)  37.5-25 MG tablet TAKE 1 TABLET BY MOUTH  DAILY 90 tablet 0   No current facility-administered medications on file prior to visit.     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 Lovena Le to f/u with as needed basis    Encounter for preventative adult health care exam with abnormal findings 02/09/2015   HTN (hypertension)    Hypokalemia 02/09/2015   Joint pain    Leg edema    Measles as a child   Measles as a child   Obesity    Panic attack    during episode of feeling to crowded    Preventative health care 02/09/2015   SOB (shortness of breath) 09/23/11   "a little bit; at rest; before ablation", SOB again now (08/2014- due to lack  of exercise)    SVT (supraventricular tachycardia) ([place])    Uterine fibroid 12/17/2012   Cervical polyp per patient Follows with [facility], Dr Evette Cristal    PAST SURGICAL HISTORY: Past Surgical History:  Procedure Laterality Date   ABDOMINAL HYSTERECTOMY  1990's   CARDIAC ELECTROPHYSIOLOGY STUDY AND ABLATION  09/23/11   MAXIMUM ACCESS (MAS)POSTERIOR LUMBAR INTERBODY FUSION (PLIF) 1 LEVEL N/A 09/24/2014   Procedure: L5-S1 MAS PLIF ;  Surgeon: Erline Levine, MD;  Location: [facility];  Service: Neurosurgery;  Laterality: N/A;  L5-S1 MAS PLIF fusion   SUPRAVENTRICULAR TACHYCARDIA ABLATION N/A 09/23/2011   Procedure: SUPRAVENTRICULAR TACHYCARDIA ABLATION;  Surgeon: Evans Lance, MD;  Location: [facility] CATH LAB;  Service: Cardiovascular;  Laterality: N/A;   TUBAL LIGATION  1980's    SOCIAL HISTORY: Social History  Substance Use Topics   Smoking status: Former Smoker    Packs/day: 0.12    Years: 8.00    Types: Cigarettes    Quit date: 03/29/1984   Smokeless tobacco: Never  Used   Alcohol use 8.4 oz/week    14 Glasses of wine per week     Comment: Drinks 2-3 glasses wine on the weekends    FAMILY HISTORY: Family History  Problem Relation Age of Onset   Cancer Sister        lung   Diabetes Mother        type 2   Heart disease Mother    Hypertension Mother    Stroke Mother    Obesity Mother    Heart disease Father    Obesity Father    Hypertension Daughter    Stroke Maternal Grandmother    Diabetes Maternal Aunt    Kidney disease Maternal Aunt    Diabetes Maternal Uncle     ROS: Review of Systems  Constitutional: Positive for malaise/fatigue.  HENT: Positive for congestion (nasal stuffiness) and sinus  pain.        Hay Fever Dry Mouth  Eyes:       Wear Glasses or Contacts Floaters  Respiratory: Positive for shortness of breath (on exertion).   Cardiovascular: Negative for chest pain and orthopnea.       Calf/Leg Pain with Walking  Musculoskeletal: Positive for back pain.       Muscle or Joint Pain    PHYSICAL EXAM: Blood pressure 124/71, pulse 74, temperature (!) 97.4 F (36.3 C), temperature source Oral, height 5\' 3"  (1.6 m), weight 227 lb (103 kg), SpO2 98 %. Body mass index is 40.21 kg/m. Physical Exam  Constitutional: She is oriented to person, place, and time. She appears well-developed and well-nourished.  Cardiovascular: Normal rate.   Pulmonary/Chest: Effort normal.  Musculoskeletal: Normal range of motion.  Neurological: She is oriented to person, place, and time.  Skin: Skin is warm and dry.  Psychiatric: She has a normal mood and affect. Her behavior is normal.  Vitals reviewed.   RECENT LABS AND TESTS: BMET    Component Value Date/Time   NA 139 02/03/2016 0831   K 3.8 02/03/2016 0831   CL 107 02/03/2016 0831   CO2 25 02/03/2016 0831   GLUCOSE 94 02/03/2016 0831   BUN 17 02/03/2016 0831   CREATININE 0.65 02/03/2016 0831   CREATININE 0.66 06/17/2015 1617   CALCIUM 9.3 02/03/2016 0831   GFRNONAA  >89 06/17/2015 1617   GFRAA >89 06/17/2015 1617   No results found for: HGBA1C No results found for: INSULIN CBC    Component Value Date/Time   WBC 4.9 02/03/2016 0831   RBC 4.43 02/03/2016 0831   HGB 12.9 02/03/2016 0831   HCT 38.5 02/03/2016 0831   PLT 224.0 02/03/2016 0831   MCV 86.9 02/03/2016 0831   MCH 27.8 01/31/2015 1401   MCHC 33.6 02/03/2016 0831   RDW 16.2 (H) 02/03/2016 0831   LYMPHSABS 2.4 02/03/2016 0831   MONOABS 0.4 02/03/2016 0831   EOSABS 0.3 02/03/2016 0831   BASOSABS 0.0 02/03/2016 0831   Iron/TIBC/Ferritin/ %Sat No results found for: IRON, TIBC, FERRITIN, IRONPCTSAT Lipid Panel     Component Value Date/Time   CHOL 190 02/03/2016 0831   TRIG 94.0 02/03/2016 0831   HDL 63.50 02/03/2016 0831   CHOLHDL 3 02/03/2016 0831   VLDL 18.8 02/03/2016 0831   LDLCALC 107 (H) 02/03/2016 0831   Hepatic Function Panel     Component Value Date/Time   PROT 6.9 02/03/2016 0831   ALBUMIN 3.8 02/03/2016 0831   AST 17 02/03/2016 0831   ALT 14 02/03/2016 0831   ALKPHOS 49 02/03/2016 0831   BILITOT 0.9 02/03/2016 0831   BILIDIR 0.1 04/03/2013 0951   IBILI 0.7 04/03/2013 0951      Component Value Date/Time   TSH 1.13 02/03/2016 0831   TSH 0.636 01/31/2015 1401   TSH 1.097 04/03/2013 0951    ECG  shows NSR with a rate of 66 BPM INDIRECT CALORIMETER done today shows a VO2 of 271 and a REE of 1888.  Her calculated basal metabolic rate is 6237 thus her basal metabolic rate is better than expected.    ASSESSMENT AND PLAN: Other fatigue - Plan: EKG 12-Lead, Vitamin B12, CBC With Differential, Folate, Hemoglobin A1c, Insulin, random, T3, T4, free, TSH, VITAMIN D 25 Hydroxy (Vit-D Deficiency, Fractures)  Shortness of breath on exertion - Plan: CBC With Differential, Lipid Panel With LDL/HDL Ratio  Essential hypertension - Plan: Comprehensive metabolic panel, Lipid Panel With LDL/HDL Ratio  Depression screening  Class 3 severe obesity with serious comorbidity and  body mass index (BMI) of 40.0 to 44.9 in adult, unspecified obesity type ([place])  PLAN: Fatigue Adalind was informed that her fatigue may be related to obesity, depression or many other causes. Labs will be ordered, and in the meanwhile Janus has agreed to work on diet, exercise and weight loss to help with fatigue. Proper sleep hygiene was discussed including the need for 7-8 hours of quality sleep each night. A sleep study was not ordered based on symptoms and Epworth score.  Dyspnea on exertion Kaydance's shortness of breath appears to be obesity related and exercise induced. She has agreed to work on weight loss and gradually increase exercise to treat her exercise induced shortness of breath. If Shaqueta follows our instructions and loses weight without improvement of her shortness of breath, we will plan to refer to pulmonology. We will monitor this condition regularly. Glen agrees to this plan.  Hypertension We discussed sodium restriction, working on healthy weight loss, and a regular exercise program as the means to achieve improved blood pressure control. Verenise agreed with this plan and agreed to follow up as directed. We will continue to monitor her blood pressure as well as her progress with the above lifestyle modifications. We will check labs and she will continue her medications as prescribed and will watch for signs of hypotension as she continues her lifestyle modifications.  Depression Screen Saesha had a mildly positive depression screening. Depression is commonly associated with obesity and often results in emotional eating behaviors. We will monitor this closely and work on CBT to help improve the non-hunger eating patterns. Referral to Psychology may be required if no improvement is seen as she continues in our clinic.  Obesity Islam is currently in the action stage of change and her goal is to continue with weight loss efforts. I recommend Eternity begin the structured treatment plan as  follows:  She has agreed to follow the Category 3 plan Aliany has been instructed to eventually work up to a goal of 150 minutes of combined cardio and strengthening exercise per week for weight loss and overall health benefits. We discussed the following Behavioral Modification Strategies today: increasing lean protein intake, decreasing simple carbohydrates and work on meal planning and easy cooking plans   She was informed of the importance of frequent follow up visits to maximize her success with intensive lifestyle modifications for her multiple health conditions. She was informed we would discuss her lab results at her next visit unless there is a critical issue that needs to be addressed sooner. Merrilee agreed to keep her next visit at the agreed upon time to discuss these results.  I, Doreene Nest, am acting as transcriptionist for  Dennard Nip, MD  I have reviewed the above documentation for accuracy and completeness, and I agree with the above. -Dennard Nip, MD     OBESITY BEHAVIORAL INTERVENTION VISIT  Today's visit was # 1 out of 39.  Starting weight: 227 lbs Starting date: 01/18/17 Today's weight : 227 lbs  Today's date: 01/18/2017 Total lbs lost to date: 0 (Patients must lose 7 lbs in the first 6 months to continue with counseling)   ASK: We discussed the diagnosis of obesity with Alean Rinne today and Shawny agreed to give [name] permission to discuss obesity behavioral modification therapy today.  ASSESS: Halli has the diagnosis of obesity and her BMI today is 40.22 Kaileia is in the action stage of change  ADVISE: Newell was educated on the multiple health risks of obesity as well as the benefit of weight loss to improve her health. She was advised of the need for long term treatment and the importance of lifestyle modifications.  AGREE: Multiple dietary modification options and treatment options were discussed and  Treana agreed to follow the Category 3 plan We  discussed the following Behavioral Modification Strategies today: increasing lean protein intake, decreasing simple carbohydrates and work on meal planning and easy cooking plans

## 2017-01-19 LAB — COMPREHENSIVE METABOLIC PANEL
A/G RATIO: 1.4 (ref 1.2–2.2)
ALK PHOS: 52 IU/L (ref 39–117)
ALT: 13 IU/L (ref 0–32)
AST: 16 IU/L (ref 0–40)
Albumin: 4.3 g/dL (ref 3.5–5.5)
BILIRUBIN TOTAL: 0.6 mg/dL (ref 0.0–1.2)
BUN/Creatinine Ratio: 24 — ABNORMAL HIGH (ref 9–23)
BUN: 13 mg/dL (ref 6–24)
CHLORIDE: 103 mmol/L (ref 96–106)
CO2: 23 mmol/L (ref 20–29)
Calcium: 9.3 mg/dL (ref 8.7–10.2)
Creatinine, Ser: 0.55 mg/dL — ABNORMAL LOW (ref 0.57–1.00)
GFR calc Af Amer: 121 mL/min/{1.73_m2} (ref >59)
GFR calc non Af Amer: 105 mL/min/{1.73_m2} (ref >59)
Globulin, Total: 3 g/dL (ref 1.5–4.5)
Glucose: 96 mg/dL (ref 65–99)
POTASSIUM: 4.3 mmol/L (ref 3.5–5.2)
Sodium: 141 mmol/L (ref 134–144)
Total Protein: 7.3 g/dL (ref 6.0–8.5)

## 2017-01-19 LAB — CBC WITH DIFFERENTIAL
BASOS: 1 %
Basophils Absolute: 0.1 10*3/uL (ref 0.0–0.2)
EOS (ABSOLUTE): 0.2 10*3/uL (ref 0.0–0.4)
Eos: 4 %
Hematocrit: 39.6 % (ref 34.0–46.6)
Hemoglobin: 13.1 g/dL (ref 11.1–15.9)
Immature Grans (Abs): 0 10*3/uL (ref 0.0–0.1)
Immature Granulocytes: 0 %
Lymphocytes Absolute: 2.5 10*3/uL (ref 0.7–3.1)
Lymphs: 43 %
MCH: 28.8 pg (ref 26.6–33.0)
MCHC: 33.1 g/dL (ref 31.5–35.7)
MCV: 87 fL (ref 79–97)
MONOS ABS: 0.4 10*3/uL (ref 0.1–0.9)
Monocytes: 8 %
NEUTROS ABS: 2.6 10*3/uL (ref 1.4–7.0)
NEUTROS PCT: 44 %
RBC: 4.55 x10E6/uL (ref 3.77–5.28)
RDW: 15.6 % — AB (ref 12.3–15.4)
WBC: 5.8 10*3/uL (ref 3.4–10.8)

## 2017-01-19 LAB — LIPID PANEL WITH LDL/HDL RATIO
Cholesterol, Total: 225 mg/dL — ABNORMAL HIGH (ref 100–199)
HDL: 67 mg/dL (ref >39)
LDL Calculated: 127 mg/dL — ABNORMAL HIGH (ref 0–99)
LDL/HDL RATIO: 1.9 ratio (ref 0.0–3.2)
Triglycerides: 156 mg/dL — ABNORMAL HIGH (ref 0–149)
VLDL Cholesterol Cal: 31 mg/dL (ref 5–40)

## 2017-01-19 LAB — HEMOGLOBIN A1C
ESTIMATED AVERAGE GLUCOSE: 103 mg/dL
HEMOGLOBIN A1C: 5.2 % (ref 4.8–5.6)

## 2017-01-19 LAB — T3: T3 TOTAL: 126 ng/dL (ref 71–180)

## 2017-01-19 LAB — VITAMIN D 25 HYDROXY (VIT D DEFICIENCY, FRACTURES): VIT D 25 HYDROXY: 20.9 ng/mL — AB (ref 30.0–100.0)

## 2017-01-19 LAB — INSULIN, RANDOM: INSULIN: 17.9 u[IU]/mL (ref 2.6–24.9)

## 2017-01-19 LAB — T4, FREE: Free T4: 1.29 ng/dL (ref 0.82–1.77)

## 2017-01-19 LAB — VITAMIN B12: Vitamin B-12: 831 pg/mL (ref 232–1245)

## 2017-01-19 LAB — FOLATE: Folate: 18 ng/mL (ref >3.0)

## 2017-01-19 LAB — TSH: TSH: 1.28 u[IU]/mL (ref 0.450–4.500)

## 2017-01-31 ENCOUNTER — Ambulatory Visit (INDEPENDENT_AMBULATORY_CARE_PROVIDER_SITE_OTHER): Payer: BLUE CROSS/BLUE SHIELD | Admitting: Family Medicine

## 2017-01-31 VITALS — BP 114/76 | HR 57 | Temp 98.0°F | Ht 63.0 in | Wt 222.0 lb

## 2017-01-31 DIAGNOSIS — E8881 Metabolic syndrome: Secondary | ICD-10-CM | POA: Diagnosis not present

## 2017-01-31 DIAGNOSIS — Z6839 Body mass index (BMI) 39.0-39.9, adult: Secondary | ICD-10-CM | POA: Diagnosis not present

## 2017-01-31 DIAGNOSIS — E559 Vitamin D deficiency, unspecified: Secondary | ICD-10-CM | POA: Diagnosis not present

## 2017-01-31 MED ORDER — VITAMIN D (ERGOCALCIFEROL) 1.25 MG (50000 UNIT) PO CAPS: 50000.0000 [IU] | ORAL_CAPSULE | ORAL | 0 refills | Status: DC

## 2017-01-31 MED ORDER — METFORMIN HCL 500 MG PO TABS: 500.0000 mg | ORAL_TABLET | Freq: Every day | ORAL | 0 refills | Status: DC

## 2017-01-31 MED FILL — VIT D2 1.25 MG (50,000 UNIT: 1.25 MG | 28 days supply | Qty: 4 | Fill #0

## 2017-01-31 MED FILL — metFORMIN HCL 500 MG TABS: 500 | 30 days supply | Qty: 30 | Fill #0

## 2017-01-31 NOTE — Progress Notes (Signed)
Office: [phone]  /  Fax: [phone]   HPI:   Chief Complaint: OBESITY Sherri Sullivan is here to discuss her progress with her obesity treatment plan. She is on the Category 3 plan and is following her eating plan approximately 93-94 % of the time. She states she is exercising 0 minutes 0 times per week. Sherri Sullivan did well with weight loss on the Category 3 plan. She noted some increased temptations but did well avoiding them. She had some family support but also some sabotage. Her hunger was well controlled on the plan after the first 2 to 3 days.  Her weight is 222 lb (100.7 kg) today and has had a weight loss of 5 pounds over a period of 2 weeks since her last visit. She has lost 5 lbs since starting treatment with [name].  Insulin Resistance Sherri Sullivan has a new diagnosis of insulin resistance, her A1c and glucose are normal but elevated insulin and she notes polyphagia. Although Sherri Sullivan's blood glucose readings are still under good control, insulin resistance puts her at greater risk of metabolic syndrome and diabetes. She is not taking metformin currently and continues to work on diet and exercise to decrease risk of diabetes.  Vitamin D deficiency Sherri Sullivan has a new diagnosis of vitamin D deficiency. She is not currently taking vit D, she notes fatigue and denies nausea, vomiting or muscle weakness.  ALLERGIES: No Known Allergies  MEDICATIONS: Current Outpatient Medications on File Prior to Visit  Medication Sig Dispense Refill  . cetirizine (ZYRTEC) 10 MG tablet Take 10 mg by mouth daily.    . diclofenac (VOLTAREN) 75 MG EC tablet TAKE 1 TABLET BY MOUTH TWO  TIMES DAILY 180 tablet 0  . estradiol (ESTRACE) 2 MG tablet Take 2 mg by mouth daily.    . Magnesium Citrate 100 MG TABS Take 2-3 tablets by mouth daily as needed.    . methocarbamol (ROBAXIN) 500 MG tablet Take 1 tablet (500 mg total) by mouth every 6 (six) hours as needed for muscle spasms. 90 tablet 0  . OMEGA-3 KRILL OIL PO Take 353 mg by mouth  daily. [facility]    . potassium chloride SA (K-DUR,KLOR-CON) 20 MEQ tablet TAKE 1 TABLET (20 MEQ TOTAL) BY MOUTH 2 (TWO) TIMES DAILY. 180 tablet 0  . Probiotic Product (PROBIOTIC PO) Take 2 tablets by mouth at bedtime.    . triamterene-hydrochlorothiazide (MAXZIDE-25) 37.5-25 MG tablet TAKE 1 TABLET BY MOUTH  DAILY 90 tablet 0  . Wheat Dextrin (BENEFIBER) POWD Take 2-3 scoop by mouth daily as needed.     No current facility-administered medications on file prior to visit.     PAST MEDICAL HISTORY: Past Medical History:  Diagnosis Date  . Anginal pain ([place])   . 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 Back pain   . Chicken pox as a child  . Constipation   . Dizziness   . Dysrhythmia 2013   SVT- treated by ablation by Dr. Lovena Le to f/u with as needed basis   . Encounter for preventative adult health care exam with abnormal findings 02/09/2015  . HTN (hypertension)   . Hypokalemia 02/09/2015  . Joint pain   . Leg edema   . Measles as a child  . Measles as a child  . Obesity   . Panic attack    during episode of feeling to crowded   . Preventative health care 02/09/2015  . SOB (shortness of  breath) 09/23/11   "a little bit; at rest; before ablation", SOB again now (08/2014- due to lack  of exercise)   . SVT (supraventricular tachycardia) ([place])   . Uterine fibroid 12/17/2012   Cervical polyp per patient Follows with [facility], Dr Evette Cristal    PAST SURGICAL HISTORY: Past Surgical History:  Procedure Laterality Date  . ABDOMINAL HYSTERECTOMY  1990's  . CARDIAC ELECTROPHYSIOLOGY STUDY AND ABLATION  09/23/11  . TUBAL LIGATION  1980's    SOCIAL HISTORY: Social History   Tobacco Use  . Smoking status: Former Smoker    Packs/day: 0.12    Years: 8.00    Pack years: 0.96    Types: Cigarettes    Last attempt to quit: 03/29/1984    Years since quitting: 32.8  . Smokeless tobacco: Never Used  Substance Use Topics    . Alcohol use: Yes    Alcohol/week: 8.4 oz    Types: 14 Glasses of wine per week    Comment: Drinks 2-3 glasses wine on the weekends  . Drug use: No    FAMILY HISTORY: Family History  Problem Relation Age of Onset  . Cancer Sister        lung  . Diabetes Mother        type 2  . Heart disease Mother   . Hypertension Mother   . Stroke Mother   . Obesity Mother   . Heart disease Father   . Obesity Father   . Hypertension Daughter   . Stroke Maternal Grandmother   . Diabetes Maternal Aunt   . Kidney disease Maternal Aunt   . Diabetes Maternal Uncle     ROS: Review of Systems  Constitutional: Positive for malaise/fatigue and weight loss.  Gastrointestinal: Negative for nausea and vomiting.  Musculoskeletal:       Negative muscle weakness  Endo/Heme/Allergies:       Positive polyphagia    PHYSICAL EXAM: Blood pressure 114/76, pulse (!) 57, temperature 98 F (36.7 C), temperature source Oral, height 5\' 3"  (1.6 m), weight 222 lb (100.7 kg), SpO2 99 %. Body mass index is 39.33 kg/m. Physical Exam  Constitutional: She is oriented to person, place, and time. She appears well-developed and well-nourished.  Cardiovascular: Normal rate.  Pulmonary/Chest: Effort normal.  Musculoskeletal: Normal range of motion.  Neurological: She is oriented to person, place, and time.  Skin: Skin is warm and dry.  Psychiatric: She has a normal mood and affect. Her behavior is normal.  Vitals reviewed.   RECENT LABS AND TESTS: BMET    Component Value Date/Time   NA 141 01/18/2017 0955   K 4.3 01/18/2017 0955   CL 103 01/18/2017 0955   CO2 23 01/18/2017 0955   GLUCOSE 96 01/18/2017 0955   GLUCOSE 94 02/03/2016 0831   BUN 13 01/18/2017 0955   CREATININE 0.55 (L) 01/18/2017 0955   CREATININE 0.66 06/17/2015 1617   CALCIUM 9.3 01/18/2017 0955   GFRNONAA 105 01/18/2017 0955   GFRNONAA >89 06/17/2015 1617   GFRAA 121 01/18/2017 0955   GFRAA >89 06/17/2015 1617   Lab Results   Component Value Date   HGBA1C 5.2 01/18/2017   Lab Results  Component Value Date   INSULIN 17.9 01/18/2017   CBC    Component Value Date/Time   WBC 5.8 01/18/2017 0955   WBC 4.9 02/03/2016 0831   RBC 4.55 01/18/2017 0955   RBC 4.43 02/03/2016 0831   HGB 13.1 01/18/2017 0955   HCT 39.6 01/18/2017 0955  PLT 224.0 02/03/2016 0831   MCV 87 01/18/2017 0955   MCH 28.8 01/18/2017 0955   MCH 27.8 01/31/2015 1401   MCHC 33.1 01/18/2017 0955   MCHC 33.6 02/03/2016 0831   RDW 15.6 (H) 01/18/2017 0955   LYMPHSABS 2.5 01/18/2017 0955   MONOABS 0.4 02/03/2016 0831   EOSABS 0.2 01/18/2017 0955   BASOSABS 0.1 01/18/2017 0955   Iron/TIBC/Ferritin/ %Sat No results found for: IRON, TIBC, FERRITIN, IRONPCTSAT Lipid Panel     Component Value Date/Time   CHOL 225 (H) 01/18/2017 0955   TRIG 156 (H) 01/18/2017 0955   HDL 67 01/18/2017 0955   CHOLHDL 3 02/03/2016 0831   VLDL 18.8 02/03/2016 0831   LDLCALC 127 (H) 01/18/2017 0955   Hepatic Function Panel     Component Value Date/Time   PROT 7.3 01/18/2017 0955   ALBUMIN 4.3 01/18/2017 0955   AST 16 01/18/2017 0955   ALT 13 01/18/2017 0955   ALKPHOS 52 01/18/2017 0955   BILITOT 0.6 01/18/2017 0955   BILIDIR 0.1 04/03/2013 0951   IBILI 0.7 04/03/2013 0951      Component Value Date/Time   TSH 1.280 01/18/2017 0955   TSH 1.13 02/03/2016 0831   TSH 0.636 01/31/2015 1401    ASSESSMENT AND PLAN: Insulin resistance  Vitamin D deficiency  Class 2 severe obesity with serious comorbidity and body mass index (BMI) of 39.0 to 39.9 in adult, unspecified obesity type ([place])  PLAN:  Insulin Resistance Hildegard will continue to work on weight loss, exercise, and decreasing simple carbohydrates in her diet to help decrease the risk of diabetes. We dicussed metformin including benefits and risks. She was informed that eating too many simple carbohydrates or too many calories at one sitting increases the likelihood of GI side effects. Sherri Sullivan  agrees to start metformin 500 mg q AM #30 with no refills. Gwendoline agrees to follow up with our clinic in 2 weeks with our dietitian and follow up in 4 weeks with Sherri Nip, MD as directed to monitor her progress.  Vitamin D Deficiency Earnesteen was informed that low vitamin D levels contributes to fatigue and are associated with obesity, breast, and colon cancer. Avaiah agrees to start prescription Vit D @50 ,000 IU every week #4 with no refills and will follow up for routine testing of vitamin D, at least 2-3 times per year. She was informed of the risk of over-replacement of vitamin D and agrees to not increase her dose unless he discusses this with [name] first. Jalilah agrees to follow up with clinic in 2 weeks with our dietitian and follow up in 4 weeks with Sherri Nip, MD.  Obesity Sherri Sullivan is currently in the action stage of change. As such, her goal is to continue with weight loss efforts She has agreed to follow the Category 3 plan Sianne has been instructed to work up to a goal of 150 minutes of combined cardio and strengthening exercise per week for weight loss and overall health benefits. We discussed the following Behavioral Modification Strategies today: increasing lean protein intake, decreasing simple carbohydrates, increasing vegetables, work on meal planning and easy cooking plans, keeping healthy foods in the home, and better snacking choices.   Calisa has agreed to follow up with our clinic in 2 weeks with our dietitian and follow up in 4 weeks with Sherri Nip, MD. She was informed of the importance of frequent follow up visits to maximize her success with intensive lifestyle modifications for her multiple health conditions.  Wilhemena Durie,  am acting as transcriptionist for Sherri Nip, MD  I have reviewed the above documentation for accuracy and completeness, and I agree with the above. -Sherri Nip, MD      Today's visit was # 2 out of 22.  Starting weight: 227 lbs Starting  date: 01/18/17 Today's weight : 222 lbs  Today's date: 01/31/2017 Total lbs lost to date: 5 (Patients must lose 7 lbs in the first 6 months to continue with counseling)   ASK: We discussed the diagnosis of obesity with Alean Rinne today and Cesia agreed to give [name] permission to discuss obesity behavioral modification therapy today.  ASSESS: Colena has the diagnosis of obesity and her BMI today is 39.34 Eudora is in the action stage of change   ADVISE: Alanah was educated on the multiple health risks of obesity as well as the benefit of weight loss to improve her health. She was advised of the need for long term treatment and the importance of lifestyle modifications.  AGREE: Multiple dietary modification options and treatment options were discussed and  Zori agreed to follow the Category 3 plan We discussed the following Behavioral Modification Strategies today: increasing lean protein intake, decreasing simple carbohydrates, increasing vegetables, work on meal planning and easy cooking plans, keeping healthy foods in the home, and better snacking choices.

## 2017-02-14 ENCOUNTER — Ambulatory Visit (INDEPENDENT_AMBULATORY_CARE_PROVIDER_SITE_OTHER): Payer: BLUE CROSS/BLUE SHIELD | Admitting: Dietician

## 2017-02-14 VITALS — Ht 63.0 in | Wt 220.0 lb

## 2017-02-14 DIAGNOSIS — Z6838 Body mass index (BMI) 38.0-38.9, adult: Secondary | ICD-10-CM | POA: Diagnosis not present

## 2017-02-14 DIAGNOSIS — E8881 Metabolic syndrome: Secondary | ICD-10-CM | POA: Diagnosis not present

## 2017-02-14 DIAGNOSIS — Z9189 Other specified personal risk factors, not elsewhere classified: Secondary | ICD-10-CM | POA: Diagnosis not present

## 2017-02-15 ENCOUNTER — Encounter: Payer: 59 | Admitting: Family Medicine

## 2017-02-16 NOTE — Progress Notes (Signed)
  Office: [phone]  /  Fax: [phone]     Kaidan has a diagnosis of insulin resistance based on her elevated fasting insulin level >5. Although Adaora's blood glucose readings are still under good control, insulin resistance puts her at greater risk of metabolic syndrome and diabetes. Eiley is here for diabetic risk counseling.   Zephyra states she is following her category 3 meal plan approximately 85-90% of the time. Her weight today is 220 lbs, a 2 lb weight loss since her last visit and a 7 lbs weight loss since beginning treatment with [name].   She reports doing well with the meal plan and states her hunger is better controlled with the start of Meformin at her last visit. She states she is getting bored with with meal plan. Ceola was given additional options for her meal plan. Healthy eating for the Thanksgiving holiday was discussed in detail.   Patient was educated about food nutrients ie protein, fats, simple and complex carbohydrates and how these affect insulin response. Focus on portion control,  avoiding simple carbohydrates and lower fat foods for ongoing wt loss efforts and glucose management  Floreine is on the following meal plan: Category 3 with options.  Her meal plan was individualized for maximum benefit.  Also discussed at length the following behavioral modifications to help maximize  success  holiday eating strategies.   Shirley has been instructed to work up to a goal of 150 minutes of combined cardio and strengthening exercise per week for weight loss and overall health benefits. Written information was provided and the following handouts were given: Healthy eating tips for the Thanksgiving holiday.    Office: [phone]  /  Fax: [phone]  OBESITY BEHAVIORAL INTERVENTION VISIT  Today's visit was # 3 out of 22.  Starting weight: 227 lbs Starting date: 01/18/17 Today's weight : Weight: 220 lb (99.8 kg)  Today's date: 02/14/17 Total lbs lost to date: 7  (Patients must  lose 7 lbs in the first 6 months to continue with counseling)   ASK: We discussed the diagnosis of obesity with Alean Rinne today and Royanne agreed to give [name] permission to discuss obesity behavioral modification therapy today.  ASSESS: Jeannene has the diagnosis of obesity and her BMI today is 38.98 Emilyrose is in the action stage of change   ADVISE: Dazha was educated on the multiple health risks of obesity as well as the benefit of weight loss to improve her health. She was advised of the need for long term treatment and the importance of lifestyle modifications.  AGREE: Multiple dietary modification options and treatment options were discussed and  Jaleesa agreed to follow the Category 3 plan We discussed the following Behavioral Modification Stratagies today: holiday eating strategies  and avoiding temptations

## 2017-02-22 ENCOUNTER — Other Ambulatory Visit: Payer: Self-pay | Admitting: Family Medicine

## 2017-02-22 DIAGNOSIS — Z1231 Encounter for screening mammogram for malignant neoplasm of breast: Secondary | ICD-10-CM

## 2017-02-25 ENCOUNTER — Ambulatory Visit (INDEPENDENT_AMBULATORY_CARE_PROVIDER_SITE_OTHER): Payer: BLUE CROSS/BLUE SHIELD | Admitting: Family Medicine

## 2017-02-25 DIAGNOSIS — E785 Hyperlipidemia, unspecified: Secondary | ICD-10-CM

## 2017-02-25 DIAGNOSIS — Z0000 Encounter for general adult medical examination without abnormal findings: Secondary | ICD-10-CM

## 2017-02-25 DIAGNOSIS — I10 Essential (primary) hypertension: Secondary | ICD-10-CM | POA: Diagnosis not present

## 2017-02-25 DIAGNOSIS — Z23 Encounter for immunization: Secondary | ICD-10-CM

## 2017-02-25 DIAGNOSIS — E559 Vitamin D deficiency, unspecified: Secondary | ICD-10-CM | POA: Insufficient documentation

## 2017-02-25 DIAGNOSIS — E669 Obesity, unspecified: Secondary | ICD-10-CM

## 2017-02-25 DIAGNOSIS — J309 Allergic rhinitis, unspecified: Secondary | ICD-10-CM

## 2017-02-25 MED ORDER — METHOCARBAMOL 500 MG PO TABS: 500.0000 mg | ORAL_TABLET | Freq: Four times a day (QID) | ORAL | 0 refills | Status: DC | PRN

## 2017-02-25 MED ORDER — DICLOFENAC SODIUM 75 MG PO TBEC: 75.0000 mg | DELAYED_RELEASE_TABLET | Freq: Two times a day (BID) | ORAL | 0 refills | Status: DC

## 2017-02-25 MED ORDER — ESTRADIOL 2 MG PO TABS: 2.0000 mg | ORAL_TABLET | Freq: Every day | ORAL | 3 refills | Status: DC

## 2017-02-25 NOTE — Assessment & Plan Note (Signed)
Patient encouraged to maintain heart healthy diet, regular exercise, adequate sleep. Consider daily probiotics. Take medications as prescribed 

## 2017-02-25 NOTE — Assessment & Plan Note (Signed)
Zyrtec 10 mg daily works well

## 2017-02-25 NOTE — Assessment & Plan Note (Signed)
Encouraged heart healthy diet, increase exercise, avoid trans fats, consider a krill oil cap daily 

## 2017-02-25 NOTE — Assessment & Plan Note (Signed)
Encouraged DASH diet, decrease po intake and increase exercise as tolerated. Needs 7-8 hours of sleep nightly. Avoid trans fats, eat small, frequent meals every 4-5 hours with lean proteins, complex carbs and healthy fats. Minimize simple carbs, following with bariatrics and doing well

## 2017-02-25 NOTE — Assessment & Plan Note (Signed)
Well controlled, no changes to meds. Encouraged heart healthy diet such as the DASH diet and exercise as tolerated.  °

## 2017-02-25 NOTE — Patient Instructions (Signed)
Preventive Care 40-64 Years, Female Preventive care refers to lifestyle choices and visits with your health care provider that can promote health and wellness. What does preventive care include?  A yearly physical exam. This is also called an annual well check.  Dental exams once or twice a year.  Routine eye exams. Ask your health care provider how often you should have your eyes checked.  Personal lifestyle choices, including: ? Daily care of your teeth and gums. ? Regular physical activity. ? Eating a healthy diet. ? Avoiding tobacco and drug use. ? Limiting alcohol use. ? Practicing safe sex. ? Taking low-dose aspirin daily starting at age 56. ? Taking vitamin and mineral supplements as recommended by your health care provider. What happens during an annual well check? The services and screenings done by your health care provider during your annual well check will depend on your age, overall health, lifestyle risk factors, and family history of disease. Counseling Your health care provider may ask you questions about your:  Alcohol use.  Tobacco use.  Drug use.  Emotional well-being.  Home and relationship well-being.  Sexual activity.  Eating habits.  Work and work Statistician.  Method of birth control.  Menstrual cycle.  Pregnancy history.  Screening You may have the following tests or measurements:  Height, weight, and BMI.  Blood pressure.  Lipid and cholesterol levels. These may be checked every 5 years, or more frequently if you are over 56 years old.  Skin check.  Lung cancer screening. You may have this screening every year starting at age 56 if you have a 30-pack-year history of smoking and currently smoke or have quit within the past 15 years.  Fecal occult blood test (FOBT) of the stool. You may have this test every year starting at age 56.  Flexible sigmoidoscopy or colonoscopy. You may have a sigmoidoscopy every 5 years or a colonoscopy  every 10 years starting at age 56.  Hepatitis C blood test.  Hepatitis B blood test.  Sexually transmitted disease (STD) testing.  Diabetes screening. This is done by checking your blood sugar (glucose) after you have not eaten for a while (fasting). You may have this done every 1-3 years.  Mammogram. This may be done every 56-2 years. Talk to your health care provider about when you should start having regular mammograms. This may depend on whether you have a family history of breast cancer.  BRCA-related cancer screening. This may be done if you have a family history of breast, ovarian, tubal, or peritoneal cancers.  Pelvic exam and Pap test. This may be done every 3 years starting at age 56. Starting at age 56, this may be done every 5 years if you have a Pap test in combination with an HPV test.  Bone density scan. This is done to screen for osteoporosis. You may have this scan if you are at high risk for osteoporosis.  Discuss your test results, treatment options, and if necessary, the need for more tests with your health care provider. Vaccines Your health care provider may recommend certain vaccines, such as:  Influenza vaccine. This is recommended every year.  Tetanus, diphtheria, and acellular pertussis (Tdap, Td) vaccine. You may need a Td booster every 10 years.  Varicella vaccine. You may need this if you have not been vaccinated.  Zoster vaccine. You may need this after age 5.  Measles, mumps, and rubella (MMR) vaccine. You may need at least one dose of MMR if you were born in  1957 or later. You may also need a second dose.  Pneumococcal 13-valent conjugate (PCV13) vaccine. You may need this if you have certain conditions and were not previously vaccinated.  Pneumococcal polysaccharide (PPSV23) vaccine. You may need one or two doses if you smoke cigarettes or if you have certain conditions.  Meningococcal vaccine. You may need this if you have certain  conditions.  Hepatitis A vaccine. You may need this if you have certain conditions or if you travel or work in places where you may be exposed to hepatitis A.  Hepatitis B vaccine. You may need this if you have certain conditions or if you travel or work in places where you may be exposed to hepatitis B.  Haemophilus influenzae type b (Hib) vaccine. You may need this if you have certain conditions.  Talk to your health care provider about which screenings and vaccines you need and how often you need them. This information is not intended to replace advice given to you by your health care provider. Make sure you discuss any questions you have with your health care provider. Document Released: 04/11/2015 Document Revised: 12/03/2015 Document Reviewed: 01/14/2015 Elsevier Interactive Patient Education  2017 [employer].

## 2017-02-25 NOTE — Assessment & Plan Note (Signed)
Is now on Vitamin D 50000 IU daily.

## 2017-02-25 NOTE — Progress Notes (Signed)
Subjective:  I acted as a Education administrator for Dr. Charlett Blake. [name], [place]  Patient ID: Sherri Sullivan, female    DOB: November 09, 1960, 56 y.o.   MRN: [id number]  No chief complaint on file.   HPI  Patient is in today for an annual exam and she is feeling well. No recent febrile illness or hospitalizations. No acute concerns. Is trying to maintain a heart healthy diet and to stay active. Denies CP/palp/SOB/HA/congestion/fevers/GI or GU c/o. Taking meds as prescribed Patient Care Team: Mosie Lukes, MD as PCP - General (Family Medicine) Dene Gentry, MD as Consulting Physician (Sports Medicine) Erline Levine, MD as Consulting Physician (Neurosurgery)   Past Medical History:  Diagnosis Date  . Anginal pain ([place])   . 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 Back pain   . Chicken pox as a child  . Constipation   . Dizziness   . Dysrhythmia 2013   SVT- treated by ablation by Dr. Lovena Le to f/u with as needed basis   . Encounter for preventative adult health care exam with abnormal findings 02/09/2015  . HTN (hypertension)   . Hypokalemia 02/09/2015  . Joint pain   . Leg edema   . Measles as a child  . Measles as a child  . Obesity   . Panic attack    during episode of feeling to crowded   . Preventative health care 02/09/2015  . SOB (shortness of breath) 09/23/11   "a little bit; at rest; before ablation", SOB again now (08/2014- due to lack  of exercise)   . SVT (supraventricular tachycardia) ([place])   . Uterine fibroid 12/17/2012   Cervical polyp per patient Follows with [facility], Dr Evette Cristal    Past Surgical History:  Procedure Laterality Date  . ABDOMINAL HYSTERECTOMY  1990's  . CARDIAC ELECTROPHYSIOLOGY STUDY AND ABLATION  09/23/11  . MAXIMUM ACCESS (MAS)POSTERIOR LUMBAR INTERBODY FUSION (PLIF) 1 LEVEL N/A 09/24/2014   Procedure: L5-S1 MAS PLIF ;  Surgeon: Erline Levine, MD;  Location: [facility];  Service: Neurosurgery;   Laterality: N/A;  L5-S1 MAS PLIF fusion  . SUPRAVENTRICULAR TACHYCARDIA ABLATION N/A 09/23/2011   Procedure: SUPRAVENTRICULAR TACHYCARDIA ABLATION;  Surgeon: Evans Lance, MD;  Location: [facility] CATH LAB;  Service: Cardiovascular;  Laterality: N/A;  . TUBAL LIGATION  1980's    Family History  Problem Relation Age of Onset  . Cancer Sister        lung  . Diabetes Mother        type 2  . Heart disease Mother   . Hypertension Mother   . Stroke Mother   . Obesity Mother   . Heart disease Father   . Obesity Father   . Hypertension Daughter   . Stroke Maternal Grandmother   . Diabetes Maternal Aunt   . Kidney disease Maternal Aunt   . Diabetes Maternal Uncle     Social History   Socioeconomic History  . Marital status: Married    Spouse name: Marcello Moores  . Number of children: 2  . Years of education: Not on file  . Highest education level: Not on file  Social Needs  . Financial resource strain: Not on file  . Food insecurity - worry: Not on file  . Food insecurity - inability: Not on file  . Transportation needs - medical: Not on file  . Transportation needs - non-medical: Not on file  Occupational History  .  Occupation: Retired  Tobacco Use  . Smoking status: Former Smoker    Packs/day: 0.12    Years: 8.00    Pack years: 0.96    Types: Cigarettes    Last attempt to quit: 03/29/1984    Years since quitting: 32.9  . Smokeless tobacco: Never Used  Substance and Sexual Activity  . Alcohol use: Yes    Alcohol/week: 8.4 oz    Types: 14 Glasses of wine per week    Comment: Drinks 2-3 glasses wine on the weekends  . Drug use: No  . Sexual activity: Not Currently  Other Topics Concern  . Not on file  Social History Narrative   No major restrictions   Lives with husband, daughter, grand daughter in a 2 story home.    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    Outpatient Medications Prior to Visit  Medication Sig Dispense Refill  .  cetirizine (ZYRTEC) 10 MG tablet Take 10 mg by mouth daily.    . Magnesium Citrate 100 MG TABS Take 2-3 tablets by mouth daily as needed.    . metFORMIN (GLUCOPHAGE) 500 MG tablet Take 1 tablet (500 mg total) daily with breakfast by mouth. 30 tablet 0  . OMEGA-3 KRILL OIL PO Take 353 mg by mouth daily. MegaRed Joint Care    . potassium chloride SA (K-DUR,KLOR-CON) 20 MEQ tablet TAKE 1 TABLET (20 MEQ TOTAL) BY MOUTH 2 (TWO) TIMES DAILY. 180 tablet 0  . Probiotic Product (PROBIOTIC PO) Take 2 tablets by mouth at bedtime.    . triamterene-hydrochlorothiazide (MAXZIDE-25) 37.5-25 MG tablet TAKE 1 TABLET BY MOUTH  DAILY 90 tablet 0  . Vitamin D, Ergocalciferol, (DRISDOL) 50000 units CAPS capsule Take 1 capsule (50,000 Units total) every 7 (seven) days by mouth. 4 capsule 0  . Wheat Dextrin (BENEFIBER) POWD Take 2-3 scoop by mouth daily as needed.    . diclofenac (VOLTAREN) 75 MG EC tablet TAKE 1 TABLET BY MOUTH TWO  TIMES DAILY 180 tablet 0  . estradiol (ESTRACE) 2 MG tablet Take 2 mg by mouth daily.    . methocarbamol (ROBAXIN) 500 MG tablet Take 1 tablet (500 mg total) by mouth every 6 (six) hours as needed for muscle spasms. 90 tablet 0   No facility-administered medications prior to visit.     No Known Allergies  Review of Systems  Constitutional: Negative for fever and malaise/fatigue.  HENT: Negative for congestion.   Eyes: Negative for blurred vision.  Respiratory: Negative for cough and shortness of breath.   Cardiovascular: Negative for chest pain, palpitations and leg swelling.  Gastrointestinal: Negative for vomiting.  Musculoskeletal: Negative for back pain.  Skin: Negative for rash.  Neurological: Negative for loss of consciousness and headaches.       Objective:    Physical Exam  Constitutional: She is oriented to person, place, and time. She appears well-developed and well-nourished. No distress.  HENT:  Head: Normocephalic and atraumatic.  Eyes: Conjunctivae are  normal.  Neck: Normal range of motion. No thyromegaly present.  Cardiovascular: Normal rate and regular rhythm.  Pulmonary/Chest: Effort normal and breath sounds normal. She has no wheezes.  Abdominal: Soft. Bowel sounds are normal. There is no tenderness.  Musculoskeletal: Normal range of motion. She exhibits no edema or deformity.  Neurological: She is alert and oriented to person, place, and time.  Skin: Skin is warm and dry. She is not diaphoretic.  Psychiatric: She has a normal mood  and affect.    There were no vitals taken for this visit. Wt Readings from Last 3 Encounters:  02/14/17 220 lb (99.8 kg)  01/31/17 222 lb (100.7 kg)  01/18/17 227 lb (103 kg)   BP Readings from Last 3 Encounters:  01/31/17 114/76  01/18/17 124/71  08/09/16 128/78     Immunization History  Administered Date(s) Administered  . Influenza-Unspecified 12/27/2013, 12/25/2014, 12/22/2015  . Pneumococcal Conjugate-13 04/12/2013  . Tdap 03/30/2007  . Zoster Recombinat (Shingrix) 08/09/2016, 12/16/2016    Health Maintenance  Topic Date Due  . Hepatitis C Screening  April 24, 1960  . HIV Screening  06/27/1975  . PAP SMEAR  01/28/2015  . INFLUENZA VACCINE  10/27/2016  . TETANUS/TDAP  03/29/2017  . COLONOSCOPY  05/27/2017  . MAMMOGRAM  03/18/2018    Lab Results  Component Value Date   WBC 5.8 01/18/2017   HGB 13.1 01/18/2017   HCT 39.6 01/18/2017   PLT 224.0 02/03/2016   GLUCOSE 96 01/18/2017   CHOL 225 (H) 01/18/2017   TRIG 156 (H) 01/18/2017   HDL 67 01/18/2017   LDLCALC 127 (H) 01/18/2017   ALT 13 01/18/2017   AST 16 01/18/2017   NA 141 01/18/2017   K 4.3 01/18/2017   CL 103 01/18/2017   CREATININE 0.55 (L) 01/18/2017   BUN 13 01/18/2017   CO2 23 01/18/2017   TSH 1.280 01/18/2017   HGBA1C 5.2 01/18/2017    Lab Results  Component Value Date   TSH 1.280 01/18/2017   Lab Results  Component Value Date   WBC 5.8 01/18/2017   HGB 13.1 01/18/2017   HCT 39.6 01/18/2017   MCV 87  01/18/2017   PLT 224.0 02/03/2016   Lab Results  Component Value Date   NA 141 01/18/2017   K 4.3 01/18/2017   CO2 23 01/18/2017   GLUCOSE 96 01/18/2017   BUN 13 01/18/2017   CREATININE 0.55 (L) 01/18/2017   BILITOT 0.6 01/18/2017   ALKPHOS 52 01/18/2017   AST 16 01/18/2017   ALT 13 01/18/2017   PROT 7.3 01/18/2017   ALBUMIN 4.3 01/18/2017   CALCIUM 9.3 01/18/2017   ANIONGAP 9 09/13/2014   GFR 121.43 02/03/2016   Lab Results  Component Value Date   CHOL 225 (H) 01/18/2017   Lab Results  Component Value Date   HDL 67 01/18/2017   Lab Results  Component Value Date   LDLCALC 127 (H) 01/18/2017   Lab Results  Component Value Date   TRIG 156 (H) 01/18/2017   Lab Results  Component Value Date   CHOLHDL 3 02/03/2016   Lab Results  Component Value Date   HGBA1C 5.2 01/18/2017         Assessment & Plan:   Problem List Items Addressed This Visit    Hyperlipidemia    Encouraged heart healthy diet, increase exercise, avoid trans fats, consider a krill oil cap daily      Allergic rhinitis    Zyrtec 10 mg daily works well      Obesity    Encouraged DASH diet, decrease po intake and increase exercise as tolerated. Needs 7-8 hours of sleep nightly. Avoid trans fats, eat small, frequent meals every 4-5 hours with lean proteins, complex carbs and healthy fats. Minimize simple carbs, following with bariatrics and doing well      Essential hypertension, benign    Well controlled, no changes to meds. Encouraged heart healthy diet such as the DASH diet and exercise as tolerated.  Preventative health care    Patient encouraged to maintain heart healthy diet, regular exercise, adequate sleep. Consider daily probiotics. Take medications as prescribed      Vitamin D deficiency    Is now on Vitamin D 50000 IU daily.        Other Visit Diagnoses    Needs flu shot    -  Primary   Relevant Orders   Flu Vaccine QUAD 6+ mos PF IM (Fluarix Quad PF)      I have  changed Domingo Sep. [place]velt's estradiol and diclofenac. I am also having her maintain her OMEGA-3 KRILL OIL PO, cetirizine, Probiotic Product (PROBIOTIC PO), potassium chloride SA, triamterene-hydrochlorothiazide, BENEFIBER, Magnesium Citrate, metFORMIN, Vitamin D (Ergocalciferol), and methocarbamol.  Meds ordered this encounter  Medications  . methocarbamol (ROBAXIN) 500 MG tablet    Sig: Take 1 tablet (500 mg total) by mouth every 6 (six) hours as needed for muscle spasms.    Dispense:  90 tablet    Refill:  0  . estradiol (ESTRACE) 2 MG tablet    Sig: Take 1 tablet (2 mg total) by mouth daily.    Dispense:  30 tablet    Refill:  3  . diclofenac (VOLTAREN) 75 MG EC tablet    Sig: Take 1 tablet (75 mg total) by mouth 2 (two) times daily.    Dispense:  180 tablet    Refill:  0    CMA served as scribe during this visit. History, Physical and Plan performed by medical provider. Documentation and orders reviewed and attested to.  Penni Homans, MD

## 2017-02-28 ENCOUNTER — Ambulatory Visit (INDEPENDENT_AMBULATORY_CARE_PROVIDER_SITE_OTHER): Payer: BLUE CROSS/BLUE SHIELD | Admitting: Family Medicine

## 2017-02-28 VITALS — BP 100/66 | HR 63 | Temp 97.9°F | Ht 63.0 in | Wt 218.0 lb

## 2017-02-28 DIAGNOSIS — I10 Essential (primary) hypertension: Secondary | ICD-10-CM | POA: Diagnosis not present

## 2017-02-28 DIAGNOSIS — Z23 Encounter for immunization: Secondary | ICD-10-CM | POA: Diagnosis not present

## 2017-02-28 DIAGNOSIS — E8881 Metabolic syndrome: Secondary | ICD-10-CM

## 2017-02-28 DIAGNOSIS — E559 Vitamin D deficiency, unspecified: Secondary | ICD-10-CM

## 2017-02-28 DIAGNOSIS — Z6838 Body mass index (BMI) 38.0-38.9, adult: Secondary | ICD-10-CM

## 2017-02-28 MED ORDER — VITAMIN D (ERGOCALCIFEROL) 1.25 MG (50000 UNIT) PO CAPS: 50000.0000 [IU] | ORAL_CAPSULE | ORAL | 0 refills | Status: DC

## 2017-02-28 MED ORDER — METFORMIN HCL 500 MG PO TABS: 500.0000 mg | ORAL_TABLET | Freq: Every day | ORAL | 0 refills | Status: DC

## 2017-02-28 MED FILL — VIT D2 1.25 MG (50,000 UNIT: 1.25 MG | 28 days supply | Qty: 4 | Fill #0

## 2017-02-28 MED FILL — metFORMIN HCL 500 MG TABS: 500 | 30 days supply | Qty: 30 | Fill #0

## 2017-02-28 NOTE — Progress Notes (Signed)
Office: [phone]  /  Fax: [phone]   HPI:   Chief Complaint: OBESITY Sherri Sullivan is here to discuss her progress with her obesity treatment plan. She is on the Category 3 plan and is following her eating plan approximately 90 % of the time. She states she is exercising 0 minutes 0 times per week. [name] did well with weight loss, even over Thanksgiving. She states her hunger is controlled. She is sometimes struggling to eat all her protein. Her weight is 218 lb (98.9 kg) today and has had a weight loss of 2 pounds over a period of 2 weeks since her last visit. She has lost 9 lbs since starting treatment with [name].  Vitamin D deficiency Sherri Sullivan has a diagnosis of vitamin D deficiency. She is currently stable on vit D, but is not yet at goal. She still notes fatigue and denies nausea, vomiting or muscle weakness.  Insulin Resistance Sherri Sullivan has a diagnosis of insulin resistance based on her elevated fasting insulin level >5. Although Sherri Sullivan's blood glucose readings are still under good control, insulin resistance puts her at greater risk of metabolic syndrome and diabetes. She is stable on metformin currently and she denies hypoglycemia or GI upset. Sherri Sullivan is doing well on her diet prescription and she continues to work on diet and exercise to decrease risk of diabetes.  ALLERGIES: No Known Allergies  MEDICATIONS: Current Outpatient Medications on File Prior to Visit  Medication Sig Dispense Refill  . cetirizine (ZYRTEC) 10 MG tablet Take 10 mg by mouth daily.    . diclofenac (VOLTAREN) 75 MG EC tablet Take 1 tablet (75 mg total) by mouth 2 (two) times daily. 180 tablet 0  . estradiol (ESTRACE) 2 MG tablet Take 1 tablet (2 mg total) by mouth daily. 30 tablet 3  . Magnesium Citrate 100 MG TABS Take 2-3 tablets by mouth daily as needed.    . metFORMIN (GLUCOPHAGE) 500 MG tablet Take 1 tablet (500 mg total) daily with breakfast by mouth. 30 tablet 0  . methocarbamol (ROBAXIN) 500 MG tablet Take 1 tablet  (500 mg total) by mouth every 6 (six) hours as needed for muscle spasms. 90 tablet 0  . OMEGA-3 KRILL OIL PO Take 353 mg by mouth daily. MegaRed Joint Care    . potassium chloride SA (K-DUR,KLOR-CON) 20 MEQ tablet TAKE 1 TABLET (20 MEQ TOTAL) BY MOUTH 2 (TWO) TIMES DAILY. 180 tablet 0  . Probiotic Product (PROBIOTIC PO) Take 2 tablets by mouth at bedtime.    . triamterene-hydrochlorothiazide (MAXZIDE-25) 37.5-25 MG tablet TAKE 1 TABLET BY MOUTH  DAILY 90 tablet 0  . Vitamin D, Ergocalciferol, (DRISDOL) 50000 units CAPS capsule Take 1 capsule (50,000 Units total) every 7 (seven) days by mouth. 4 capsule 0  . Wheat Dextrin (BENEFIBER) POWD Take 2-3 scoop by mouth daily as needed.     No current facility-administered medications on file prior to visit.     PAST MEDICAL HISTORY: Past Medical History:  Diagnosis Date  . Anginal pain ([place])   . 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 Back pain   . Chicken pox as a child  . Constipation   . Dizziness   . Dysrhythmia 2013   SVT- treated by ablation by Dr. Lovena Le to f/u with as needed basis   . Encounter for preventative adult health care exam with abnormal findings 02/09/2015  . HTN (hypertension)   . Hypokalemia 02/09/2015  . Joint pain   .  Leg edema   . Measles as a child  . Measles as a child  . Obesity   . Panic attack    during episode of feeling to crowded   . Preventative health care 02/09/2015  . SOB (shortness of breath) 09/23/11   "a little bit; at rest; before ablation", SOB again now (08/2014- due to lack  of exercise)   . SVT (supraventricular tachycardia) ([place])   . Uterine fibroid 12/17/2012   Cervical polyp per patient Follows with [facility], Dr Evette Cristal    PAST SURGICAL HISTORY: Past Surgical History:  Procedure Laterality Date  . ABDOMINAL HYSTERECTOMY  1990's  . CARDIAC ELECTROPHYSIOLOGY STUDY AND ABLATION  09/23/11  . MAXIMUM ACCESS (MAS)POSTERIOR LUMBAR  INTERBODY FUSION (PLIF) 1 LEVEL N/A 09/24/2014   Procedure: L5-S1 MAS PLIF ;  Surgeon: Erline Levine, MD;  Location: [place] NEURO ORS;  Service: Neurosurgery;  Laterality: N/A;  L5-S1 MAS PLIF fusion  . SUPRAVENTRICULAR TACHYCARDIA ABLATION N/A 09/23/2011   Procedure: SUPRAVENTRICULAR TACHYCARDIA ABLATION;  Surgeon: Evans Lance, MD;  Location: [facility] CATH LAB;  Service: Cardiovascular;  Laterality: N/A;  . TUBAL LIGATION  1980's    SOCIAL HISTORY: Social History   Tobacco Use  . Smoking status: Former Smoker    Packs/day: 0.12    Years: 8.00    Pack years: 0.96    Types: Cigarettes    Last attempt to quit: 03/29/1984    Years since quitting: 32.9  . Smokeless tobacco: Never Used  Substance Use Topics  . Alcohol use: Yes    Alcohol/week: 8.4 oz    Types: 14 Glasses of wine per week    Comment: Drinks 2-3 glasses wine on the weekends  . Drug use: No    FAMILY HISTORY: Family History  Problem Relation Age of Onset  . Cancer Sister        lung  . Diabetes Mother        type 2  . Heart disease Mother   . Hypertension Mother   . Stroke Mother   . Obesity Mother   . Heart disease Father   . Obesity Father   . Hypertension Daughter   . Stroke Maternal Grandmother   . Diabetes Maternal Aunt   . Kidney disease Maternal Aunt   . Diabetes Maternal Uncle     ROS: Review of Systems  Constitutional: Positive for malaise/fatigue and weight loss.  Gastrointestinal: Negative for diarrhea, nausea and vomiting.  Musculoskeletal:       Negative muscle weakness  Endo/Heme/Allergies:       Negative hypoglycemia    PHYSICAL EXAM: Blood pressure 100/66, pulse 63, temperature 97.9 F (36.6 C), temperature source Oral, height 5\' 3"  (1.6 m), weight 218 lb (98.9 kg), SpO2 99 %. Body mass index is 38.62 kg/m. Physical Exam  Constitutional: She is oriented to person, place, and time. She appears well-developed and well-nourished.  Cardiovascular: Normal rate.  Pulmonary/Chest: Effort normal.    Musculoskeletal: Normal range of motion.  Neurological: She is oriented to person, place, and time.  Skin: Skin is warm and dry.  Psychiatric: She has a normal mood and affect. Her behavior is normal.  Vitals reviewed.   RECENT LABS AND TESTS: BMET    Component Value Date/Time   NA 141 01/18/2017 0955   K 4.3 01/18/2017 0955   CL 103 01/18/2017 0955   CO2 23 01/18/2017 0955   GLUCOSE 96 01/18/2017 0955   GLUCOSE 94 02/03/2016 0831   BUN 13 01/18/2017 0955  CREATININE 0.55 (L) 01/18/2017 0955   CREATININE 0.66 06/17/2015 1617   CALCIUM 9.3 01/18/2017 0955   GFRNONAA 105 01/18/2017 0955   GFRNONAA >89 06/17/2015 1617   GFRAA 121 01/18/2017 0955   GFRAA >89 06/17/2015 1617   Lab Results  Component Value Date   HGBA1C 5.2 01/18/2017   Lab Results  Component Value Date   INSULIN 17.9 01/18/2017   CBC    Component Value Date/Time   WBC 5.8 01/18/2017 0955   WBC 4.9 02/03/2016 0831   RBC 4.55 01/18/2017 0955   RBC 4.43 02/03/2016 0831   HGB 13.1 01/18/2017 0955   HCT 39.6 01/18/2017 0955   PLT 224.0 02/03/2016 0831   MCV 87 01/18/2017 0955   MCH 28.8 01/18/2017 0955   MCH 27.8 01/31/2015 1401   MCHC 33.1 01/18/2017 0955   MCHC 33.6 02/03/2016 0831   RDW 15.6 (H) 01/18/2017 0955   LYMPHSABS 2.5 01/18/2017 0955   MONOABS 0.4 02/03/2016 0831   EOSABS 0.2 01/18/2017 0955   BASOSABS 0.1 01/18/2017 0955   Iron/TIBC/Ferritin/ %Sat No results found for: IRON, TIBC, FERRITIN, IRONPCTSAT Lipid Panel     Component Value Date/Time   CHOL 225 (H) 01/18/2017 0955   TRIG 156 (H) 01/18/2017 0955   HDL 67 01/18/2017 0955   CHOLHDL 3 02/03/2016 0831   VLDL 18.8 02/03/2016 0831   LDLCALC 127 (H) 01/18/2017 0955   Hepatic Function Panel     Component Value Date/Time   PROT 7.3 01/18/2017 0955   ALBUMIN 4.3 01/18/2017 0955   AST 16 01/18/2017 0955   ALT 13 01/18/2017 0955   ALKPHOS 52 01/18/2017 0955   BILITOT 0.6 01/18/2017 0955   BILIDIR 0.1 04/03/2013 0951    IBILI 0.7 04/03/2013 0951      Component Value Date/Time   TSH 1.280 01/18/2017 0955   TSH 1.13 02/03/2016 0831   TSH 0.636 01/31/2015 1401    ASSESSMENT AND PLAN: Insulin resistance - Plan: metFORMIN (GLUCOPHAGE) 500 MG tablet  Vitamin D deficiency - Plan: Vitamin D, Ergocalciferol, (DRISDOL) 50000 units CAPS capsule  Class 2 severe obesity with serious comorbidity and body mass index (BMI) of 38.0 to 38.9 in adult, unspecified obesity type (Sherri Sullivan)   PLAN:  Vitamin D Deficiency Sherri Sullivan was informed that low vitamin D levels contributes to fatigue and are associated with obesity, breast, and colon cancer. She agrees to continue to take prescription Vit D @50 ,000 IU every week #4 with no refills and will follow up for routine testing of vitamin D, at least 2-3 times per year. She was informed of the risk of over-replacement of vitamin D and agrees to not increase her dose unless he discusses this with [name] first. Sherri Sullivan agrees to follow up with our clinic in 2 weeks.  Insulin Resistance Sherri Sullivan will continue to work on weight loss, exercise, and decreasing simple carbohydrates in her diet to help decrease the risk of diabetes. We dicussed metformin including benefits and risks. She was informed that eating too many simple carbohydrates or too many calories at one sitting increases the likelihood of GI side effects. Sherri Sullivan requested metformin for now and prescription was written today for 1 month refill. Sherri Sullivan agreed to follow up with [name] as directed to monitor her progress.  Obesity Sherri Sullivan is currently in the action stage of change. As such, her goal is to continue with weight loss efforts She has agreed to follow the Category 3 plan with protein options given Jaquetta has been instructed to work up to  a goal of 150 minutes of combined cardio and strengthening exercise per week for weight loss and overall health benefits. We discussed the following Behavioral Modification Strategies today: no skipping meals,  increasing lean protein intake, dealing with family or coworker sabotage and holiday eating strategies   Zelia has agreed to follow up with our clinic in 2 weeks. She was informed of the importance of frequent follow up visits to maximize her success with intensive lifestyle modifications for her multiple health conditions.  I, Doreene Nest, am acting as transcriptionist for Sherri Nip, MD  I have reviewed the above documentation for accuracy and completeness, and I agree with the above. -Sherri Nip, MD    OBESITY BEHAVIORAL INTERVENTION VISIT  Today's visit was # 3 out of 22.  Starting weight: 227 lbs Starting date: 01/18/17 Today's weight : 218 lbs Today's date: 02/28/2017 Total lbs lost to date: 9 (Patients must lose 7 lbs in the first 6 months to continue with counseling)   ASK: We discussed the diagnosis of obesity with Sherri Sullivan today and Sherri Sullivan agreed to give [name] permission to discuss obesity behavioral modification therapy today.  ASSESS: Donell has the diagnosis of obesity and her BMI today is 38.63 Madie is in the action stage of change   ADVISE: Halee was educated on the multiple health risks of obesity as well as the benefit of weight loss to improve her health. She was advised of the need for long term treatment and the importance of lifestyle modifications.  AGREE: Multiple dietary modification options and treatment options were discussed and  Tarissa agreed to follow the Category 3 plan with protein options given We discussed the following Behavioral Modification Strategies today: no skipping meals, increasing lean protein intake, dealing with family or coworker sabotage and holiday eating strategies

## 2017-03-14 ENCOUNTER — Ambulatory Visit (INDEPENDENT_AMBULATORY_CARE_PROVIDER_SITE_OTHER): Payer: BLUE CROSS/BLUE SHIELD | Admitting: Family Medicine

## 2017-03-14 VITALS — BP 107/70 | HR 69 | Temp 97.8°F | Ht 63.0 in | Wt 215.0 lb

## 2017-03-14 DIAGNOSIS — E8881 Metabolic syndrome: Secondary | ICD-10-CM

## 2017-03-14 DIAGNOSIS — E559 Vitamin D deficiency, unspecified: Secondary | ICD-10-CM | POA: Diagnosis not present

## 2017-03-14 DIAGNOSIS — Z6838 Body mass index (BMI) 38.0-38.9, adult: Secondary | ICD-10-CM

## 2017-03-14 MED ORDER — VITAMIN D (ERGOCALCIFEROL) 1.25 MG (50000 UNIT) PO CAPS: 50000.0000 [IU] | ORAL_CAPSULE | ORAL | 0 refills | Status: DC

## 2017-03-14 MED ORDER — METFORMIN HCL 500 MG PO TABS: 500.0000 mg | ORAL_TABLET | Freq: Every day | ORAL | 0 refills | Status: DC

## 2017-03-14 NOTE — Progress Notes (Signed)
Office: [phone]  /  Fax: [phone]   HPI:   Chief Complaint: OBESITY Sherri Sullivan is here to discuss her progress with her obesity treatment plan. She is on the Category 3 plan with protein options given and is following her eating plan approximately 90 % of the time. She states she is exercising 0 minutes 0 times per week. Sherri Sullivan continues to do well with weight loss. She is doing well with planning meals ahead of time and working on maintaining holiday eating strategies.  Her weight is 215 lb (97.5 kg) today and has had a weight loss of 3 pounds over a period of 2 weeks since her last visit. She has lost 12 lbs since starting treatment with [name].  Insulin Resistance Sherri Sullivan has a diagnosis of insulin resistance based on her elevated fasting insulin level >5. Although Sherri Sullivan's blood glucose readings are still under good control, insulin resistance puts her at greater risk of metabolic syndrome and diabetes. Sherri Sullivan is doing very well with diet, attempting to avoid diabetes mellitus. She is tolerating metformin well and denies nausea, vomiting, or hypoglycemia. She has not started exercising yet.  Vitamin D deficiency Sherri Sullivan has a diagnosis of vitamin D deficiency. She is stable on prescription Vit D, but not yet at goal. She denies nausea, vomiting or muscle weakness.  ALLERGIES: No Known Allergies  MEDICATIONS: Current Outpatient Medications on File Prior to Visit  Medication Sig Dispense Refill  . cetirizine (ZYRTEC) 10 MG tablet Take 10 mg by mouth daily.    . diclofenac (VOLTAREN) 75 MG EC tablet Take 1 tablet (75 mg total) by mouth 2 (two) times daily. 180 tablet 0  . estradiol (ESTRACE) 2 MG tablet Take 1 tablet (2 mg total) by mouth daily. 30 tablet 3  . Magnesium Citrate 100 MG TABS Take 2-3 tablets by mouth daily as needed.    . methocarbamol (ROBAXIN) 500 MG tablet Take 1 tablet (500 mg total) by mouth every 6 (six) hours as needed for muscle spasms. 90 tablet 0  . OMEGA-3 KRILL OIL PO  Take 353 mg by mouth daily. [facility]    . potassium chloride SA (K-DUR,KLOR-CON) 20 MEQ tablet TAKE 1 TABLET (20 MEQ TOTAL) BY MOUTH 2 (TWO) TIMES DAILY. 180 tablet 0  . Probiotic Product (PROBIOTIC PO) Take 2 tablets by mouth at bedtime.    . triamterene-hydrochlorothiazide (MAXZIDE-25) 37.5-25 MG tablet TAKE 1 TABLET BY MOUTH  DAILY 90 tablet 0  . Wheat Dextrin (BENEFIBER) POWD Take 2-3 scoop by mouth daily as needed.     No current facility-administered medications on file prior to visit.     PAST MEDICAL HISTORY: Past Medical History:  Diagnosis Date  . Anginal pain ([place])   . 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 Back pain   . Chicken pox as a child  . Constipation   . Dizziness   . Dysrhythmia 2013   SVT- treated by ablation by Dr. Lovena Le to f/u with as needed basis   . Encounter for preventative adult health care exam with abnormal findings 02/09/2015  . HTN (hypertension)   . Hypokalemia 02/09/2015  . Joint pain   . Leg edema   . Measles as a child  . Measles as a child  . Obesity   . Panic attack    during episode of feeling to crowded   . Preventative health care 02/09/2015  . SOB (shortness of breath) 09/23/11   "  a little bit; at rest; before ablation", SOB again now (08/2014- due to lack  of exercise)   . SVT (supraventricular tachycardia) ([place])   . Uterine fibroid 12/17/2012   Cervical polyp per patient Follows with [facility], Dr Evette Cristal    PAST SURGICAL HISTORY: Past Surgical History:  Procedure Laterality Date  . ABDOMINAL HYSTERECTOMY  1990's  . CARDIAC ELECTROPHYSIOLOGY STUDY AND ABLATION  09/23/11  . MAXIMUM ACCESS (MAS)POSTERIOR LUMBAR INTERBODY FUSION (PLIF) 1 LEVEL N/A 09/24/2014   Procedure: L5-S1 MAS PLIF ;  Surgeon: Sherri Sullivan Levine, MD;  Location: [facility];  Service: Neurosurgery;  Laterality: N/A;  L5-S1 MAS PLIF fusion  . SUPRAVENTRICULAR TACHYCARDIA ABLATION N/A 09/23/2011    Procedure: SUPRAVENTRICULAR TACHYCARDIA ABLATION;  Surgeon: Evans Lance, MD;  Location: [facility];  Service: Cardiovascular;  Laterality: N/A;  . TUBAL LIGATION  1980's    SOCIAL HISTORY: Social History   Tobacco Use  . Smoking status: Former Smoker    Packs/day: 0.12    Years: 8.00    Pack years: 0.96    Types: Cigarettes    Last attempt to quit: 03/29/1984    Years since quitting: 32.9  . Smokeless tobacco: Never Used  Substance Use Topics  . Alcohol use: Yes    Alcohol/week: 8.4 oz    Types: 14 Glasses of wine per week    Comment: Drinks 2-3 glasses wine on the weekends  . Drug use: No    FAMILY HISTORY: Family History  Problem Relation Age of Onset  . Cancer Sister        lung  . Diabetes Mother        type 2  . Heart disease Mother   . Hypertension Mother   . Stroke Mother   . Obesity Mother   . Heart disease Father   . Obesity Father   . Hypertension Daughter   . Stroke Maternal Grandmother   . Diabetes Maternal Aunt   . Kidney disease Maternal Aunt   . Diabetes Maternal Uncle     ROS: Review of Systems  Constitutional: Positive for weight loss.  Gastrointestinal: Negative for nausea and vomiting.  Musculoskeletal:       Negative muscle weakness  Endo/Heme/Allergies:       Negative hypoglycemia    PHYSICAL EXAM: Blood pressure 107/70, pulse 69, temperature 97.8 F (36.6 C), temperature source Oral, height 5\' 3"  (1.6 m), weight 215 lb (97.5 kg), SpO2 98 %. Body mass index is 38.09 kg/m. Physical Exam  Constitutional: She is oriented to person, place, and time. She appears well-developed and well-nourished.  Cardiovascular: Normal rate.  Pulmonary/Chest: Effort normal.  Musculoskeletal: Normal range of motion.  Neurological: She is oriented to person, place, and time.  Skin: Skin is warm and dry.  Psychiatric: She has a normal mood and affect. Her behavior is normal.  Vitals reviewed.   RECENT LABS AND TESTS: BMET    Component Value  Date/Time   NA 141 01/18/2017 0955   K 4.3 01/18/2017 0955   CL 103 01/18/2017 0955   CO2 23 01/18/2017 0955   GLUCOSE 96 01/18/2017 0955   GLUCOSE 94 02/03/2016 0831   BUN 13 01/18/2017 0955   CREATININE 0.55 (L) 01/18/2017 0955   CREATININE 0.66 06/17/2015 1617   CALCIUM 9.3 01/18/2017 0955   GFRNONAA 105 01/18/2017 0955   GFRNONAA >89 06/17/2015 1617   GFRAA 121 01/18/2017 0955   GFRAA >89 06/17/2015 1617   Lab Results  Component Value Date  HGBA1C 5.2 01/18/2017   Lab Results  Component Value Date   INSULIN 17.9 01/18/2017   CBC    Component Value Date/Time   WBC 5.8 01/18/2017 0955   WBC 4.9 02/03/2016 0831   RBC 4.55 01/18/2017 0955   RBC 4.43 02/03/2016 0831   HGB 13.1 01/18/2017 0955   HCT 39.6 01/18/2017 0955   PLT 224.0 02/03/2016 0831   MCV 87 01/18/2017 0955   MCH 28.8 01/18/2017 0955   MCH 27.8 01/31/2015 1401   MCHC 33.1 01/18/2017 0955   MCHC 33.6 02/03/2016 0831   RDW 15.6 (H) 01/18/2017 0955   LYMPHSABS 2.5 01/18/2017 0955   MONOABS 0.4 02/03/2016 0831   EOSABS 0.2 01/18/2017 0955   BASOSABS 0.1 01/18/2017 0955   Iron/TIBC/Ferritin/ %Sat No results found for: IRON, TIBC, FERRITIN, IRONPCTSAT Lipid Panel     Component Value Date/Time   CHOL 225 (H) 01/18/2017 0955   TRIG 156 (H) 01/18/2017 0955   HDL 67 01/18/2017 0955   CHOLHDL 3 02/03/2016 0831   VLDL 18.8 02/03/2016 0831   LDLCALC 127 (H) 01/18/2017 0955   Hepatic Function Panel     Component Value Date/Time   PROT 7.3 01/18/2017 0955   ALBUMIN 4.3 01/18/2017 0955   AST 16 01/18/2017 0955   ALT 13 01/18/2017 0955   ALKPHOS 52 01/18/2017 0955   BILITOT 0.6 01/18/2017 0955   BILIDIR 0.1 04/03/2013 0951   IBILI 0.7 04/03/2013 0951      Component Value Date/Time   TSH 1.280 01/18/2017 0955   TSH 1.13 02/03/2016 0831   TSH 0.636 01/31/2015 1401    ASSESSMENT AND PLAN: Insulin resistance - Plan: metFORMIN (GLUCOPHAGE) 500 MG tablet  Vitamin D deficiency - Plan: Vitamin D,  Ergocalciferol, (DRISDOL) 50000 units CAPS capsule  Class 2 severe obesity with serious comorbidity and body mass index (BMI) of 38.0 to 38.9 in adult, unspecified obesity type (HCC)  PLAN:  Insulin Resistance Sherri Sullivan will continue to work on weight loss, diet, exercise, and decreasing simple carbohydrates in her diet to help decrease the risk of diabetes. We dicussed metformin including benefits and risks. She was informed that eating too many simple carbohydrates or too many calories at one sitting increases the likelihood of GI side effects. Sherri Sullivan Sullivan to continue taking metformin 500 mg q AM #30 and we will refill for 1 month. Sherri Sullivan Sullivan to follow up with our clinic in 3 weeks as directed to monitor her progress.  Vitamin D Deficiency Sherri Sullivan was informed that low vitamin D levels contributes to fatigue and are associated with obesity, breast, and colon cancer. Sherri Sullivan Sullivan to continue taking prescription Vit D @50 ,000 IU every week #4 and we will refill for 1 month. She will follow up for routine testing of vitamin D, at least 2-3 times per year. She was informed of the risk of over-replacement of vitamin D and Sullivan to not increase her dose unless he discusses this with [name] first. We will recheck labs in 1 month and Sherri Sullivan to follow up with our clinic in 3 weeks.  Obesity Sherri Sullivan. As such, her goal is to continue with weight loss efforts She has agreed to follow the Category 3 plan Sherri Sullivan to work up to a goal of 150 minutes of combined cardio and strengthening exercise per week for weight loss and overall health benefits. We discussed the following Behavioral Modification Strategies today: work on meal planning and easy cooking plans,  dealing with family or coworker sabotage, holiday eating strategies, and celebration eating strategies    Sherri Sullivan has agreed to follow up with our clinic in 3 weeks. She was informed of the importance of  frequent follow up visits to maximize her success with intensive lifestyle modifications for her multiple health conditions.  I, Sherri Sullivan, am acting as transcriptionist for Dennard Nip, MD  I have reviewed the above documentation for accuracy and completeness, and I agree with the above. -Dennard Nip, MD     Today's visit was # 4 out of 22.  Starting weight: 227 lbs Starting date: 01/18/17 Today's weight : 215 lbs  Today's date: 03/14/2017 Total lbs lost to date: 12 (Patients must lose 7 lbs in the first 6 months to continue with counseling)   ASK: We discussed the diagnosis of obesity with Sherri Sullivan today and Sherri Sullivan agreed to give [name] permission to discuss obesity behavioral modification therapy today.  ASSESS: Lilliam has the diagnosis of obesity and her BMI today is 38.09 Shaindy is in the action stage of Sullivan   ADVISE: Geri was educated on the multiple health risks of obesity as well as the benefit of weight loss to improve her health. She was advised of the need for long term treatment and the importance of lifestyle modifications.  AGREE: Multiple dietary modification options and treatment options were discussed and  [place] agreed to follow the Category 3 plan We discussed the following Behavioral Modification Strategies today: work on meal planning and easy cooking plans, dealing with family or coworker sabotage, holiday eating strategies, and celebration eating strategies

## 2017-03-19 ENCOUNTER — Ambulatory Visit (HOSPITAL_BASED_OUTPATIENT_CLINIC_OR_DEPARTMENT_OTHER): Payer: BLUE CROSS/BLUE SHIELD

## 2017-03-23 MED FILL — VIT D2 1.25 MG (50,000 UNIT: 1.25 MG | 28 days supply | Qty: 4 | Fill #0

## 2017-03-23 MED FILL — metFORMIN HCL 500 MG TABS: 500 | 30 days supply | Qty: 30 | Fill #0

## 2017-03-25 ENCOUNTER — Ambulatory Visit (HOSPITAL_BASED_OUTPATIENT_CLINIC_OR_DEPARTMENT_OTHER)
Admission: RE | Admit: 2017-03-25 | Discharge: 2017-03-25 | Disposition: A | Discharge status: Home / Self Care | Payer: BLUE CROSS/BLUE SHIELD | Source: Ambulatory Visit | Attending: Family Medicine | Admitting: Family Medicine

## 2017-03-25 DIAGNOSIS — Z1231 Encounter for screening mammogram for malignant neoplasm of breast: Secondary | ICD-10-CM

## 2017-04-03 ENCOUNTER — Other Ambulatory Visit: Payer: Self-pay | Admitting: Family Medicine

## 2017-04-04 ENCOUNTER — Ambulatory Visit (INDEPENDENT_AMBULATORY_CARE_PROVIDER_SITE_OTHER): Payer: BLUE CROSS/BLUE SHIELD | Admitting: Family Medicine

## 2017-04-04 ENCOUNTER — Encounter (INDEPENDENT_AMBULATORY_CARE_PROVIDER_SITE_OTHER): Payer: Self-pay

## 2017-04-06 ENCOUNTER — Other Ambulatory Visit: Payer: Self-pay | Admitting: Family Medicine

## 2017-04-20 ENCOUNTER — Encounter: Payer: Self-pay | Admitting: Family Medicine

## 2017-04-27 ENCOUNTER — Ambulatory Visit (INDEPENDENT_AMBULATORY_CARE_PROVIDER_SITE_OTHER): Payer: BLUE CROSS/BLUE SHIELD | Admitting: Medical

## 2017-04-27 ENCOUNTER — Encounter: Payer: Self-pay | Admitting: Medical

## 2017-04-27 VITALS — BP 114/59 | HR 84 | Temp 98.1°F | Resp 16 | Ht 63.0 in | Wt 219.8 lb

## 2017-04-27 DIAGNOSIS — J0100 Acute maxillary sinusitis, unspecified: Secondary | ICD-10-CM

## 2017-04-27 DIAGNOSIS — R059 Cough, unspecified: Secondary | ICD-10-CM

## 2017-04-27 DIAGNOSIS — R05 Cough: Secondary | ICD-10-CM

## 2017-04-27 DIAGNOSIS — J40 Bronchitis, not specified as acute or chronic: Secondary | ICD-10-CM | POA: Diagnosis not present

## 2017-04-27 LAB — POCT INFLUENZA A/B
INFLUENZA A, POC: NEGATIVE
Influenza B, POC: NEGATIVE

## 2017-04-27 MED ORDER — DOXYCYCLINE HYCLATE 100 MG PO TABS: 100.0000 mg | ORAL_TABLET | Freq: Two times a day (BID) | ORAL | 0 refills | Status: DC

## 2017-04-27 MED ORDER — BENZONATATE 100 MG PO CAPS: 100.0000 mg | ORAL_CAPSULE | Freq: Three times a day (TID) | ORAL | 0 refills | Status: DC | PRN

## 2017-04-27 MED ORDER — FLUTICASONE PROPIONATE 50 MCG/ACT NA SUSP: 2.0000 | Freq: Every day | NASAL | 1 refills | Status: DC

## 2017-04-27 MED FILL — FLUTICASONE PROP 50 MCG SPR: 50 | 30 days supply | Qty: 16 | Fill #0

## 2017-04-27 MED FILL — BENZONATATE 100 MG CAPSULE: 100 | 10 days supply | Qty: 30 | Fill #0

## 2017-04-27 MED FILL — DOXYCYCLINE HYCLATE 100 MG: 100 | 10 days supply | Qty: 20 | Fill #0

## 2017-04-27 NOTE — Patient Instructions (Addendum)
You appear to have bronchitis and sinusitis(more sinus infection). Rest hydrate and tylenol for fever. I am prescribing cough medicine benzonatate, and doxycycline antibiotic. For your nasal congestion rx flonase.   Flu test was negative(clinically not real suspicious for flu)  If you have any wheezing notify us and would send in inhaler.  You should gradually get better. If not then notify us and would recommend a chest xray.  Follow up in 7-10 days or as needed

## 2017-04-27 NOTE — Progress Notes (Signed)
Subjective:    Patient ID: AMARAH BROSSMAN, female    DOB: 10-24-1960, 57 y.o.   MRN: [id number]  HPI  Pt in sick since new years. State at first nasal congestion, runny nose and sneezing with some mild sinus pressure. On and off and symptoms just did not get better. Then this week got cough and chest congestion. Pt states dry cough. Also now when blowing nose some yellow mucus.    Review of Systems  Constitutional: Negative for chills, fatigue and fever.  HENT: Positive for congestion, sinus pressure and sinus pain. Negative for postnasal drip and sore throat.        Mild upper teeth sensitive with sinus pressure.  Respiratory: Positive for cough. Negative for chest tightness and wheezing.   Cardiovascular: Negative for chest pain and palpitations.  Gastrointestinal: Negative for abdominal pain.  Musculoskeletal: Negative for back pain, myalgias and neck pain.  Neurological: Negative for dizziness, weakness, numbness and headaches.  Hematological: Negative for adenopathy. Does not bruise/bleed easily.  Psychiatric/Behavioral: Negative for behavioral problems and confusion.    Past Medical History:  Diagnosis Date  . Anginal pain ([place])   . 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 Back pain   . Chicken pox as a child  . Constipation   . Dizziness   . Dysrhythmia 2013   SVT- treated by ablation by Dr. Lovena Le to f/u with as needed basis   . Encounter for preventative adult health care exam with abnormal findings 02/09/2015  . HTN (hypertension)   . Hypokalemia 02/09/2015  . Joint pain   . Leg edema   . Measles as a child  . Measles as a child  . Obesity   . Panic attack    during episode of feeling to crowded   . Preventative health care 02/09/2015  . SOB (shortness of breath) 09/23/11   "a little bit; at rest; before ablation", SOB again now (08/2014- due to lack  of exercise)   . SVT (supraventricular tachycardia) ([place])   .  Uterine fibroid 12/17/2012   Cervical polyp per patient Follows with [facility], Dr Evette Cristal     Social History   Socioeconomic History  . Marital status: Married    Spouse name: Marcello Moores  . Number of children: 2  . Years of education: Not on file  . Highest education level: Not on file  Social Needs  . Financial resource strain: Not on file  . Food insecurity - worry: Not on file  . Food insecurity - inability: Not on file  . Transportation needs - medical: Not on file  . Transportation needs - non-medical: Not on file  Occupational History  . Occupation: Retired  Tobacco Use  . Smoking status: Former Smoker    Packs/day: 0.12    Years: 8.00    Pack years: 0.96    Types: Cigarettes    Last attempt to quit: 03/29/1984    Years since quitting: 33.1  . Smokeless tobacco: Never Used  Substance and Sexual Activity  . Alcohol use: Yes    Alcohol/week: 8.4 oz    Types: 14 Glasses of wine per week    Comment: Drinks 2-3 glasses wine on the weekends  . Drug use: No  . Sexual activity: Not Currently  Other Topics Concern  . Not on file  Social History Narrative   No major restrictions   Lives with husband, daughter, grand daughter in a  2 story home.    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    Past Surgical History:  Procedure Laterality Date  . ABDOMINAL HYSTERECTOMY  1990's  . CARDIAC ELECTROPHYSIOLOGY STUDY AND ABLATION  09/23/11  . MAXIMUM ACCESS (MAS)POSTERIOR LUMBAR INTERBODY FUSION (PLIF) 1 LEVEL N/A 09/24/2014   Procedure: L5-S1 MAS PLIF ;  Surgeon: Erline Levine, MD;  Location: [facility];  Service: Neurosurgery;  Laterality: N/A;  L5-S1 MAS PLIF fusion  . SUPRAVENTRICULAR TACHYCARDIA ABLATION N/A 09/23/2011   Procedure: SUPRAVENTRICULAR TACHYCARDIA ABLATION;  Surgeon: Evans Lance, MD;  Location: [facility] CATH LAB;  Service: Cardiovascular;  Laterality: N/A;  . TUBAL LIGATION  1980's    Family History  Problem Relation  Age of Onset  . Cancer Sister        lung  . Diabetes Mother        type 2  . Heart disease Mother   . Hypertension Mother   . Stroke Mother   . Obesity Mother   . Heart disease Father   . Obesity Father   . Hypertension Daughter   . Stroke Maternal Grandmother   . Diabetes Maternal Aunt   . Kidney disease Maternal Aunt   . Diabetes Maternal Uncle     No Known Allergies  Current Outpatient Medications on File Prior to Visit  Medication Sig Dispense Refill  . cetirizine (ZYRTEC) 10 MG tablet Take 10 mg by mouth daily.    . diclofenac (VOLTAREN) 75 MG EC tablet TAKE 1 TABLET BY MOUTH TWO  TIMES DAILY 180 tablet 0  . estradiol (ESTRACE) 2 MG tablet Take 1 tablet (2 mg total) by mouth daily. 30 tablet 3  . Magnesium Citrate 100 MG TABS Take 2-3 tablets by mouth daily as needed.    . metFORMIN (GLUCOPHAGE) 500 MG tablet Take 1 tablet (500 mg total) by mouth daily with breakfast. 30 tablet 0  . methocarbamol (ROBAXIN) 500 MG tablet Take 1 tablet (500 mg total) by mouth every 6 (six) hours as needed for muscle spasms. 90 tablet 0  . OMEGA-3 KRILL OIL PO Take 353 mg by mouth daily. Mega[facility] Care    . potassium chloride SA (K-DUR,KLOR-CON) 20 MEQ tablet TAKE 1 TABLET BY MOUTH TWO  TIMES DAILY 180 tablet 0  . Probiotic Product (PROBIOTIC PO) Take 2 tablets by mouth at bedtime.    . triamterene-hydrochlorothiazide (MAXZIDE-25) 37.5-25 MG tablet TAKE 1 TABLET BY MOUTH  DAILY 90 tablet 0  . Vitamin D, Ergocalciferol, (DRISDOL) 50000 units CAPS capsule Take 1 capsule (50,000 Units total) by mouth every 7 (seven) days. 4 capsule 0  . Wheat Dextrin (BENEFIBER) POWD Take 2-3 scoop by mouth daily as needed.     No current facility-administered medications on file prior to visit.     BP (!) 114/59   Pulse 84   Temp 98.1 F (36.7 C) (Oral)   Resp 16   Ht 5\' 3"  (1.6 m)   Wt 219 lb 12.8 oz (99.7 kg)   SpO2 100%   BMI 38.94 kg/m       Objective:   Physical Exam  General  Mental  Status - Alert. General Appearance - Well groomed. Not in acute distress.  Skin Rashes- No Rashes.  HEENT Head- Normal. Ear Auditory Canal - Left- Normal. Right - Normal.Tympanic Membrane- Left- Normal. Right- Normal. Eye Sclera/Conjunctiva- Left- Normal. Right- Normal. Nose & Sinuses Nasal Mucosa- Left-  Boggy and Congested. Right-  Boggy and  Congested.Bilateral maxillary but  frontal sinus pressure. Mouth & Throat Lips: Upper Lip- Normal: no dryness, cracking, pallor, cyanosis, or vesicular eruption. Lower Lip-Normal: no dryness, cracking, pallor, cyanosis or vesicular eruption. Buccal Mucosa- Bilateral- No Aphthous ulcers. Oropharynx- No Discharge or Erythema. Tonsils: Characteristics- Bilateral- No Erythema or Congestion. Size/Enlargement- Bilateral- No enlargement. Discharge- bilateral-None.  Neck Neck- Supple. No Masses.   Chest and Lung Exam Auscultation: Breath Sounds:-Clear even and unlabored.  Cardiovascular Auscultation:Rythm- Regular, rate and rhythm. Murmurs & Other Heart Sounds:Ausculatation of the heart reveal- No Murmurs.  Lymphatic Head & Neck General Head & Neck Lymphatics: Bilateral: Description- No Localized lymphadenopathy.       Assessment & Plan:  You appear to have bronchitis and sinusitis(more sinus infection). Rest hydrate and tylenol for fever. I am prescribing cough medicine benzonatate, and doxycycline antibiotic. For your nasal congestion rx flonase.   Flu test was negative(clinically not real suspicious for flu)  If you have any wheezing notify us and would send in inhaler.  You should gradually get better. If not then notify us and would recommend a chest xray.  Follow up in 7-10 days or as needed  [employer], [employer]

## 2017-05-04 ENCOUNTER — Encounter: Payer: Self-pay | Admitting: Gastroenterology

## 2017-05-13 ENCOUNTER — Telehealth: Payer: Self-pay | Admitting: Family Medicine

## 2017-05-13 NOTE — Telephone Encounter (Signed)
Copied from [place]. Topic: Quick Communication - See Telephone Encounter >> May 13, 2017  3:16 PM Percell Belt A wrote: CRM for notification. See Telephone encounter for: [facility] called in and stated that there is a interaction with Potassium and Triamtoren.  They would like the Dr to review interaction Phone number [phone] Ref number [id number]   05/13/17.

## 2017-05-16 NOTE — Telephone Encounter (Signed)
She has been on both meds for  A long time and we monitor her potassium it was wnl on last check so she can continue both meds

## 2017-05-16 NOTE — Telephone Encounter (Signed)
Please advise 

## 2017-05-22 ENCOUNTER — Other Ambulatory Visit: Payer: Self-pay | Admitting: Family Medicine

## 2017-05-23 MED ORDER — TRIAMTERENE-HCTZ 37.5-25 MG PO TABS: 1.0000 | ORAL_TABLET | Freq: Every day | ORAL | 1 refills | Status: DC

## 2017-05-23 MED ORDER — POTASSIUM CHLORIDE CRYS ER 20 MEQ PO TBCR: EXTENDED_RELEASE_TABLET | ORAL | 1 refills | Status: DC

## 2017-06-01 ENCOUNTER — Telehealth: Payer: Self-pay | Admitting: *Deleted

## 2017-06-01 NOTE — Telephone Encounter (Signed)
Received request for Medical records from [place] with [place] [facility] Examination notes attached [x2]; forwarded to [place] for email/scan/SLS 03/06

## 2017-06-02 NOTE — Telephone Encounter (Signed)
Left vmail for patient to call the office back

## 2017-06-09 ENCOUNTER — Telehealth: Payer: Self-pay | Admitting: *Deleted

## 2017-06-09 NOTE — Telephone Encounter (Signed)
Received Medical Evaluation Services from [place]; forwarded to provider,states "the record should be destroyed after it has served it's intended purposes" [no return response]/SLS 03/14

## 2017-06-28 DIAGNOSIS — M25561 Pain in right knee: Secondary | ICD-10-CM | POA: Insufficient documentation

## 2017-06-28 DIAGNOSIS — M25562 Pain in left knee: Secondary | ICD-10-CM | POA: Insufficient documentation

## 2017-06-30 DIAGNOSIS — M25562 Pain in left knee: Secondary | ICD-10-CM | POA: Diagnosis not present

## 2017-06-30 DIAGNOSIS — M25561 Pain in right knee: Secondary | ICD-10-CM | POA: Diagnosis not present

## 2017-08-08 ENCOUNTER — Encounter: Payer: Self-pay | Admitting: Gastroenterology

## 2017-08-08 ENCOUNTER — Telehealth: Payer: Self-pay | Admitting: *Deleted

## 2017-08-08 NOTE — Telephone Encounter (Signed)
Received request for Medical records from [facility] Disability Processing; forwarded to [place] for email/scan/SLS 05/13

## 2017-08-11 DIAGNOSIS — M25561 Pain in right knee: Secondary | ICD-10-CM | POA: Diagnosis not present

## 2017-08-11 DIAGNOSIS — M1711 Unilateral primary osteoarthritis, right knee: Secondary | ICD-10-CM | POA: Diagnosis not present

## 2017-08-29 ENCOUNTER — Ambulatory Visit (INDEPENDENT_AMBULATORY_CARE_PROVIDER_SITE_OTHER): Payer: BLUE CROSS/BLUE SHIELD | Admitting: Family Medicine

## 2017-08-29 ENCOUNTER — Encounter: Payer: Self-pay | Admitting: Family Medicine

## 2017-08-29 DIAGNOSIS — E559 Vitamin D deficiency, unspecified: Secondary | ICD-10-CM

## 2017-08-29 DIAGNOSIS — K635 Polyp of colon: Secondary | ICD-10-CM

## 2017-08-29 DIAGNOSIS — E8881 Metabolic syndrome: Secondary | ICD-10-CM | POA: Diagnosis not present

## 2017-08-29 DIAGNOSIS — E669 Obesity, unspecified: Secondary | ICD-10-CM

## 2017-08-29 DIAGNOSIS — E785 Hyperlipidemia, unspecified: Secondary | ICD-10-CM

## 2017-08-29 DIAGNOSIS — M5441 Lumbago with sciatica, right side: Secondary | ICD-10-CM | POA: Diagnosis not present

## 2017-08-29 DIAGNOSIS — I10 Essential (primary) hypertension: Secondary | ICD-10-CM | POA: Diagnosis not present

## 2017-08-29 DIAGNOSIS — M5442 Lumbago with sciatica, left side: Secondary | ICD-10-CM

## 2017-08-29 DIAGNOSIS — G8929 Other chronic pain: Secondary | ICD-10-CM

## 2017-08-29 LAB — COMPREHENSIVE METABOLIC PANEL
ALBUMIN: 4.2 g/dL (ref 3.5–5.2)
ALT: 11 U/L (ref 0–35)
AST: 15 U/L (ref 0–37)
Alkaline Phosphatase: 52 U/L (ref 39–117)
BILIRUBIN TOTAL: 0.7 mg/dL (ref 0.2–1.2)
BUN: 15 mg/dL (ref 6–23)
CALCIUM: 10 mg/dL (ref 8.4–10.5)
CHLORIDE: 104 meq/L (ref 96–112)
CO2: 25 mEq/L (ref 19–32)
CREATININE: 0.63 mg/dL (ref 0.40–1.20)
GFR: 125.18 mL/min (ref >60.00)
Glucose, Bld: 90 mg/dL (ref 70–99)
Potassium: 4 mEq/L (ref 3.5–5.1)
Sodium: 138 mEq/L (ref 135–145)
Total Protein: 7 g/dL (ref 6.0–8.3)

## 2017-08-29 LAB — LIPID PANEL
Cholesterol: 241 mg/dL — ABNORMAL HIGH (ref 0–200)
HDL: 59.3 mg/dL (ref >39.00)
NONHDL: 182.13
TRIGLYCERIDES: 239 mg/dL — AB (ref 0.0–149.0)
Total CHOL/HDL Ratio: 4
VLDL: 47.8 mg/dL — ABNORMAL HIGH (ref 0.0–40.0)

## 2017-08-29 LAB — HEMOGLOBIN A1C: Hgb A1c MFr Bld: 5.5 % (ref 4.6–6.5)

## 2017-08-29 LAB — CBC
HCT: 39.8 % (ref 36.0–46.0)
HEMOGLOBIN: 13.2 g/dL (ref 12.0–15.0)
MCHC: 33.1 g/dL (ref 30.0–36.0)
MCV: 88.9 fl (ref 78.0–100.0)
PLATELETS: 245 10*3/uL (ref 150.0–400.0)
RBC: 4.47 Mil/uL (ref 3.87–5.11)
RDW: 15.7 % — ABNORMAL HIGH (ref 11.5–15.5)
WBC: 5.3 10*3/uL (ref 4.0–10.5)

## 2017-08-29 LAB — VITAMIN D 25 HYDROXY (VIT D DEFICIENCY, FRACTURES): VITD: 25.55 ng/mL — AB (ref 30.00–100.00)

## 2017-08-29 LAB — TSH: TSH: 1.1 u[IU]/mL (ref 0.35–4.50)

## 2017-08-29 LAB — LDL CHOLESTEROL, DIRECT: LDL DIRECT: 143 mg/dL

## 2017-08-29 MED ORDER — DICLOFENAC SODIUM 75 MG PO TBEC: 75.0000 mg | DELAYED_RELEASE_TABLET | Freq: Two times a day (BID) | ORAL | 1 refills | Status: DC

## 2017-08-29 MED ORDER — METFORMIN HCL 500 MG PO TABS: 500.0000 mg | ORAL_TABLET | Freq: Every day | ORAL | 0 refills | Status: DC

## 2017-08-29 MED ORDER — FLUTICASONE PROPIONATE 50 MCG/ACT NA SUSP: 2.0000 | Freq: Every day | NASAL | 1 refills | Status: DC

## 2017-08-29 MED ORDER — TRIAMTERENE-HCTZ 37.5-25 MG PO TABS: 1.0000 | ORAL_TABLET | Freq: Every day | ORAL | 1 refills | Status: DC

## 2017-08-29 MED ORDER — POTASSIUM CHLORIDE CRYS ER 20 MEQ PO TBCR: EXTENDED_RELEASE_TABLET | ORAL | 1 refills | Status: DC

## 2017-08-29 MED ORDER — ESTRADIOL 2 MG PO TABS: 2.0000 mg | ORAL_TABLET | Freq: Every day | ORAL | 3 refills | Status: DC

## 2017-08-29 MED ORDER — METHOCARBAMOL 500 MG PO TABS: 500.0000 mg | ORAL_TABLET | Freq: Four times a day (QID) | ORAL | 0 refills | Status: DC | PRN

## 2017-08-29 NOTE — Progress Notes (Signed)
Subjective:  I acted as a Education administrator for Dr. Charlett Blake. [name], [place]  Patient ID: Sherri Sullivan, female    DOB: 04-09-1960, 57 y.o.   MRN: [id number]  No chief complaint on file.   HPI  Patient is in today for a follow up and overall she is doing well. No recent febrile illness or hospitalizations. She continues to struggle with daily low back pain which unfortunately limits her ability to exercise regularly. Denies CP/palp/SOB/HA/congestion/fevers/GI or GU c/o. Taking meds as prescribed. No polyuria or polydipsia complaints.   Patient Care Team: Mosie Lukes, MD as PCP - General (Family Medicine) Dene Gentry, MD as Consulting Physician (Sports Medicine) Erline Levine, MD as Consulting Physician (Neurosurgery)   Past Medical History:  Diagnosis Date  . Anginal pain ([place])   . 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 Back pain   . Chicken pox as a child  . Constipation   . Dizziness   . Dysrhythmia 2013   SVT- treated by ablation by Dr. Lovena Le to f/u with as needed basis   . Encounter for preventative adult health care exam with abnormal findings 02/09/2015  . HTN (hypertension)   . Hypokalemia 02/09/2015  . Joint pain   . Leg edema   . Measles as a child  . Measles as a child  . Obesity   . Panic attack    during episode of feeling to crowded   . Preventative health care 02/09/2015  . SOB (shortness of breath) 09/23/11   "a little bit; at rest; before ablation", SOB again now (08/2014- due to lack  of exercise)   . SVT (supraventricular tachycardia) ([place])   . Uterine fibroid 12/17/2012   Cervical polyp per patient Follows with [facility], Dr Evette Cristal    Past Surgical History:  Procedure Laterality Date  . ABDOMINAL HYSTERECTOMY  1990's  . CARDIAC ELECTROPHYSIOLOGY STUDY AND ABLATION  09/23/11  . MAXIMUM ACCESS (MAS)POSTERIOR LUMBAR INTERBODY FUSION (PLIF) 1 LEVEL N/A 09/24/2014   Procedure: L5-S1 MAS PLIF ;   Surgeon: Erline Levine, MD;  Location: [facility];  Service: Neurosurgery;  Laterality: N/A;  L5-S1 MAS PLIF fusion  . SUPRAVENTRICULAR TACHYCARDIA ABLATION N/A 09/23/2011   Procedure: SUPRAVENTRICULAR TACHYCARDIA ABLATION;  Surgeon: Evans Lance, MD;  Location: [facility] CATH LAB;  Service: Cardiovascular;  Laterality: N/A;  . TUBAL LIGATION  1980's    Family History  Problem Relation Age of Onset  . Cancer Sister        lung  . Diabetes Mother        type 2  . Heart disease Mother   . Hypertension Mother   . Stroke Mother   . Obesity Mother   . Heart disease Father   . Obesity Father   . Hypertension Daughter   . Stroke Maternal Grandmother   . Diabetes Maternal Aunt   . Kidney disease Maternal Aunt   . Diabetes Maternal Uncle     Social History   Socioeconomic History  . Marital status: Married    Spouse name: Marcello Moores  . Number of children: 2  . Years of education: Not on file  . Highest education level: Not on file  Occupational History  . Occupation: Retired  Scientific laboratory technician  . Financial resource strain: Not on file  . Food insecurity:    Worry: Not on file    Inability: Not on file  . Transportation needs:  Medical: Not on file    Non-medical: Not on file  Tobacco Use  . Smoking status: Former Smoker    Packs/day: 0.12    Years: 8.00    Pack years: 0.96    Types: Cigarettes    Last attempt to quit: 03/29/1984    Years since quitting: 33.4  . Smokeless tobacco: Never Used  Substance and Sexual Activity  . Alcohol use: Yes    Alcohol/week: 8.4 oz    Types: 14 Glasses of wine per week    Comment: Drinks 2-3 glasses wine on the weekends  . Drug use: No  . Sexual activity: Not Currently  Lifestyle  . Physical activity:    Days per week: Not on file    Minutes per session: Not on file  . Stress: Not on file  Relationships  . Social connections:    Talks on phone: Not on file    Gets together: Not on file    Attends religious service: Not on file    Active  member of club or organization: Not on file    Attends meetings of clubs or organizations: Not on file    Relationship status: Not on file  . Intimate partner violence:    Fear of current or ex partner: Not on file    Emotionally abused: Not on file    Physically abused: Not on file    Forced sexual activity: Not on file  Other Topics Concern  . Not on file  Social History Narrative   No major restrictions   Lives with husband, daughter, grand daughter in a 2 story home.    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    Outpatient Medications Prior to Visit  Medication Sig Dispense Refill  . cetirizine (ZYRTEC) 10 MG tablet Take 10 mg by mouth daily.    . Magnesium Citrate 100 MG TABS Take 2-3 tablets by mouth daily as needed.    . OMEGA-3 KRILL OIL PO Take 353 mg by mouth daily. MegaRed Joint Care    . Probiotic Product (PROBIOTIC PO) Take 2 tablets by mouth at bedtime.    . Vitamin D, Ergocalciferol, (DRISDOL) 50000 units CAPS capsule Take 1 capsule (50,000 Units total) by mouth every 7 (seven) days. 4 capsule 0  . Wheat Dextrin (BENEFIBER) POWD Take 2-3 scoop by mouth daily as needed.    . benzonatate (TESSALON) 100 MG capsule Take 1 capsule (100 mg total) by mouth 3 (three) times daily as needed for cough. 30 capsule 0  . diclofenac (VOLTAREN) 75 MG EC tablet TAKE 1 TABLET BY MOUTH TWO  TIMES DAILY 180 tablet 0  . doxycycline (VIBRA-TABS) 100 MG tablet Take 1 tablet (100 mg total) by mouth 2 (two) times daily. 20 tablet 0  . estradiol (ESTRACE) 2 MG tablet Take 1 tablet (2 mg total) by mouth daily. 30 tablet 3  . fluticasone (FLONASE) 50 MCG/ACT nasal spray Place 2 sprays into both nostrils daily. 16 g 1  . metFORMIN (GLUCOPHAGE) 500 MG tablet Take 1 tablet (500 mg total) by mouth daily with breakfast. 30 tablet 0  . methocarbamol (ROBAXIN) 500 MG tablet Take 1 tablet (500 mg total) by mouth every 6 (six) hours as needed for muscle spasms. 90 tablet 0   . potassium chloride SA (K-DUR,KLOR-CON) 20 MEQ tablet TAKE 1 TABLET BY MOUTH TWO  TIMES DAILY 180 tablet 1  . triamterene-hydrochlorothiazide (MAXZIDE-25) 37.5-25 MG tablet Take 1 tablet  by mouth daily. 90 tablet 1   No facility-administered medications prior to visit.     Allergies  Allergen Reactions  . Doxycycline Diarrhea and Nausea And Vomiting    Review of Systems  Constitutional: Negative for fever.  HENT: Negative for congestion.   Eyes: Negative for blurred vision.  Respiratory: Negative for shortness of breath.   Cardiovascular: Negative for chest pain, palpitations and leg swelling.  Gastrointestinal: Negative for abdominal pain, blood in stool and nausea.  Genitourinary: Negative for dysuria and frequency.  Musculoskeletal: Positive for back pain. Negative for falls.  Skin: Negative for rash.  Neurological: Negative for dizziness, loss of consciousness and headaches.  Endo/Heme/Allergies: Negative for environmental allergies.  Psychiatric/Behavioral: Negative for depression. The patient is not nervous/anxious.   All other systems reviewed and are negative.      Objective:    Physical Exam  Constitutional: She is oriented to person, place, and time. She appears well-developed and well-nourished. No distress.  HENT:  Head: Normocephalic and atraumatic.  Nose: Nose normal.  Eyes: Right eye exhibits no discharge. Left eye exhibits no discharge.  Neck: Normal range of motion. Neck supple.  Cardiovascular: Normal rate and regular rhythm.  No murmur heard. Pulmonary/Chest: Effort normal and breath sounds normal.  Abdominal: Soft. Bowel sounds are normal. There is no tenderness.  Musculoskeletal: She exhibits no edema.  Neurological: She is alert and oriented to person, place, and time.  Skin: Skin is warm and dry.  Psychiatric: She has a normal mood and affect.  Nursing note and vitals reviewed.   BP 118/68 (BP Location: Left Arm, Patient Position: Sitting,  Cuff Size: Normal)   Pulse 75   Temp 97.9 F (36.6 C) (Oral)   Resp 18   Wt 226 lb 3.2 oz (102.6 kg)   SpO2 97%   BMI 40.07 kg/m  Wt Readings from Last 3 Encounters:  08/29/17 226 lb 3.2 oz (102.6 kg)  04/27/17 219 lb 12.8 oz (99.7 kg)  03/14/17 215 lb (97.5 kg)   BP Readings from Last 3 Encounters:  08/29/17 118/68  04/27/17 (!) 114/59  03/14/17 107/70     Immunization History  Administered Date(s) Administered  . Influenza,inj,Quad PF,6+ Mos 02/28/2017  . Influenza-Unspecified 12/27/2013, 12/25/2014, 12/22/2015  . Pneumococcal Conjugate-13 04/12/2013  . Tdap 03/30/2007  . Zoster Recombinat (Shingrix) 08/09/2016, 12/16/2016    Health Maintenance  Topic Date Due  . Hepatitis C Screening  06-20-1960  . HIV Screening  06/27/1975  . PAP SMEAR  01/28/2015  . TETANUS/TDAP  03/29/2017  . COLONOSCOPY  05/27/2017  . INFLUENZA VACCINE  10/27/2017  . MAMMOGRAM  03/26/2019    Lab Results  Component Value Date   WBC 5.8 01/18/2017   HGB 13.1 01/18/2017   HCT 39.6 01/18/2017   PLT 224.0 02/03/2016   GLUCOSE 96 01/18/2017   CHOL 225 (H) 01/18/2017   TRIG 156 (H) 01/18/2017   HDL 67 01/18/2017   LDLCALC 127 (H) 01/18/2017   ALT 13 01/18/2017   AST 16 01/18/2017   NA 141 01/18/2017   K 4.3 01/18/2017   CL 103 01/18/2017   CREATININE 0.55 (L) 01/18/2017   BUN 13 01/18/2017   CO2 23 01/18/2017   TSH 1.280 01/18/2017   HGBA1C 5.2 01/18/2017    Lab Results  Component Value Date   TSH 1.280 01/18/2017   Lab Results  Component Value Date   WBC 5.8 01/18/2017   HGB 13.1 01/18/2017   HCT 39.6 01/18/2017   MCV 87 01/18/2017  PLT 224.0 02/03/2016   Lab Results  Component Value Date   NA 141 01/18/2017   K 4.3 01/18/2017   CO2 23 01/18/2017   GLUCOSE 96 01/18/2017   BUN 13 01/18/2017   CREATININE 0.55 (L) 01/18/2017   BILITOT 0.6 01/18/2017   ALKPHOS 52 01/18/2017   AST 16 01/18/2017   ALT 13 01/18/2017   PROT 7.3 01/18/2017   ALBUMIN 4.3 01/18/2017    CALCIUM 9.3 01/18/2017   ANIONGAP 9 09/13/2014   GFR 121.43 02/03/2016   Lab Results  Component Value Date   CHOL 225 (H) 01/18/2017   Lab Results  Component Value Date   HDL 67 01/18/2017   Lab Results  Component Value Date   LDLCALC 127 (H) 01/18/2017   Lab Results  Component Value Date   TRIG 156 (H) 01/18/2017   Lab Results  Component Value Date   CHOLHDL 3 02/03/2016   Lab Results  Component Value Date   HGBA1C 5.2 01/18/2017         Assessment & Plan:   Problem List Items Addressed This Visit    Hyperlipidemia    Encouraged heart healthy diet, increase exercise, avoid trans fats, consider a krill oil cap daily      Relevant Medications   triamterene-hydrochlorothiazide (MAXZIDE-25) 37.5-25 MG tablet   Other Relevant Orders   Lipid panel   LOW BACK PAIN    Continues to have chronic low back pain, had surgical intervention at one level in the past but it has not worsened. Consider a TENS unit      Relevant Medications   methocarbamol (ROBAXIN) 500 MG tablet   diclofenac (VOLTAREN) 75 MG EC tablet   Obesity    Encouraged DASH or MIND diet, decrease po intake and increase exercise as tolerated. Needs 7-8 hours of sleep nightly. Avoid trans fats, eat small, frequent meals every 4-5 hours with lean proteins, complex carbs and healthy fats. Minimize simple carbs      Relevant Medications   metFORMIN (GLUCOPHAGE) 500 MG tablet   Essential hypertension, benign    Well controlled, no changes to meds. Encouraged heart healthy diet such as the DASH diet and exercise as tolerated.       Relevant Medications   triamterene-hydrochlorothiazide (MAXZIDE-25) 37.5-25 MG tablet   Other Relevant Orders   CBC   Comprehensive metabolic panel   TSH   Vitamin D deficiency    Taking a daily Multivitamin no weekly dose at this time. Check level today      Relevant Orders   VITAMIN D 25 Hydroxy (Vit-D Deficiency, Fractures)   Colon polyp    Has a colonoscopy  scheduled soon       Other Visit Diagnoses    Insulin resistance       Relevant Medications   metFORMIN (GLUCOPHAGE) 500 MG tablet   Other Relevant Orders   Hemoglobin A1c      I have discontinued Domingo Sep. Steeves's doxycycline and benzonatate. I have also changed her diclofenac. Additionally, I am having her maintain her OMEGA-3 KRILL OIL PO, cetirizine, Probiotic Product (PROBIOTIC PO), BENEFIBER, Magnesium Citrate, Vitamin D (Ergocalciferol), metFORMIN, methocarbamol, triamterene-hydrochlorothiazide, estradiol, fluticasone, and potassium chloride SA.  Meds ordered this encounter  Medications  . metFORMIN (GLUCOPHAGE) 500 MG tablet    Sig: Take 1 tablet (500 mg total) by mouth daily with breakfast.    Dispense:  30 tablet    Refill:  0  . methocarbamol (ROBAXIN) 500 MG tablet    Sig: Take 1  tablet (500 mg total) by mouth every 6 (six) hours as needed for muscle spasms.    Dispense:  90 tablet    Refill:  0  . triamterene-hydrochlorothiazide (MAXZIDE-25) 37.5-25 MG tablet    Sig: Take 1 tablet by mouth daily.    Dispense:  90 tablet    Refill:  1  . estradiol (ESTRACE) 2 MG tablet    Sig: Take 1 tablet (2 mg total) by mouth daily.    Dispense:  30 tablet    Refill:  3  . fluticasone (FLONASE) 50 MCG/ACT nasal spray    Sig: Place 2 sprays into both nostrils daily.    Dispense:  16 g    Refill:  1  . diclofenac (VOLTAREN) 75 MG EC tablet    Sig: Take 1 tablet (75 mg total) by mouth 2 (two) times daily.    Dispense:  180 tablet    Refill:  1  . potassium chloride SA (K-DUR,KLOR-CON) 20 MEQ tablet    Sig: TAKE 1 TABLET BY MOUTH TWO  TIMES DAILY    Dispense:  180 tablet    Refill:  1    CMA served as scribe during this visit. History, Physical and Plan performed by medical provider. Documentation and orders reviewed and attested to.  Penni Homans, MD

## 2017-08-29 NOTE — Assessment & Plan Note (Signed)
Continues to have chronic low back pain, had surgical intervention at one level in the past but it has not worsened. Consider a TENS unit

## 2017-08-29 NOTE — Assessment & Plan Note (Signed)
Encouraged DASH or MIND diet, decrease po intake and increase exercise as tolerated. Needs 7-8 hours of sleep nightly. Avoid trans fats, eat small, frequent meals every 4-5 hours with lean proteins, complex carbs and healthy fats. Minimize simple carbs

## 2017-08-29 NOTE — Patient Instructions (Addendum)
Try a TENS unit to the back to see  If that helps the pain  Consider a nerve block or ablation    Back Pain, Adult Many adults have back pain from time to time. Common causes of back pain include:  A strained muscle or ligament.  Wear and tear (degeneration) of the spinal disks.  Arthritis.  A hit to the back.  Back pain can be short-lived (acute) or last a long time (chronic). A physical exam, lab tests, and imaging studies may be done to find the cause of your pain. Follow these instructions at home: Managing pain and stiffness  Take over-the-counter and prescription medicines only as told by your health care provider.  If directed, apply heat to the affected area as often as told by your health care provider. Use the heat source that your health care provider recommends, such as a moist heat pack or a heating pad. ? Place a towel between your skin and the heat source. ? Leave the heat on for 20-30 minutes. ? Remove the heat if your skin turns bright red. This is especially important if you are unable to feel pain, heat, or cold. You have a greater risk of getting burned.  If directed, apply ice to the injured area: ? Put ice in a plastic bag. ? Place a towel between your skin and the bag. ? Leave the ice on for 20 minutes, 2-3 times a day for the first 2-3 days. Activity  Do not stay in bed. Resting more than 1-2 days can delay your recovery.  Take short walks on even surfaces as soon as you are able. Try to increase the length of time you walk each day.  Do not sit, drive, or stand in one place for more than 30 minutes at a time. Sitting or standing for long periods of time can put stress on your back.  Use proper lifting techniques. When you bend and lift, use positions that put less stress on your back: ? [place] your knees. ? Keep the load close to your body. ? Avoid twisting.  Exercise regularly as told by your health care provider. Exercising will help your back  heal faster. This also helps prevent back injuries by keeping muscles strong and flexible.  Your health care provider may recommend that you see a physical therapist. This person can help you come up with a safe exercise program. Do any exercises as told by your physical therapist. Lifestyle  Maintain a healthy weight. Extra weight puts stress on your back and makes it difficult to have good posture.  Avoid activities or situations that make you feel anxious or stressed. Learn ways to manage anxiety and stress. One way to manage stress is through exercise. Stress and anxiety increase muscle tension and can make back pain worse. General instructions  Sleep on a firm mattress in a comfortable position. Try lying on your side with your knees slightly bent. If you lie on your back, put a pillow under your knees.  Follow your treatment plan as told by your health care provider. This may include: ? Cognitive or behavioral therapy. ? Acupuncture or massage therapy. ? Meditation or yoga. Contact a health care provider if:  You have pain that is not relieved with rest or medicine.  You have increasing pain going down into your legs or buttocks.  Your pain does not improve in 2 weeks.  You have pain at night.  You lose weight.  You have a fever or  chills. Get help right away if:  You develop new bowel or bladder control problems.  You have unusual weakness or numbness in your arms or legs.  You develop nausea or vomiting.  You develop abdominal pain.  You feel faint. Summary  Many adults have back pain from time to time. A physical exam, lab tests, and imaging studies may be done to find the cause of your pain.  Use proper lifting techniques. When you bend and lift, use positions that put less stress on your back.  Take over-the-counter and prescription medicines and apply heat or ice as directed by your health care provider. This information is not intended to replace advice  given to you by your health care provider. Make sure you discuss any questions you have with your health care provider. Document Released: 03/15/2005 Document Revised: 04/19/2016 Document Reviewed: 04/19/2016 Elsevier Interactive Patient Education  2018 [place] or DASH Eating Plan DASH stands for "Dietary Approaches to Stop Hypertension." The DASH eating plan is a healthy eating plan that has been shown to reduce high blood pressure (hypertension). It may also reduce your risk for type 2 diabetes, heart disease, and stroke. The DASH eating plan may also help with weight loss. What are tips for following this plan? General guidelines  Avoid eating more than 2,300 mg (milligrams) of salt (sodium) a day. If you have hypertension, you may need to reduce your sodium intake to 1,500 mg a day.  Limit alcohol intake to no more than 1 drink a day for nonpregnant women and 2 drinks a day for men. One drink equals 12 oz of beer, 5 oz of wine, or 1 oz of hard liquor.  Work with your health care provider to maintain a healthy body weight or to lose weight. Ask what an ideal weight is for you.  Get at least 30 minutes of exercise that causes your heart to beat faster (aerobic exercise) most days of the week. Activities may include walking, swimming, or biking.  Work with your health care provider or diet and nutrition specialist (dietitian) to adjust your eating plan to your individual calorie needs. Reading food labels  Check food labels for the amount of sodium per serving. Choose foods with less than 5 percent of the Daily Value of sodium. Generally, foods with less than 300 mg of sodium per serving fit into this eating plan.  To find whole grains, look for the word "whole" as the first word in the ingredient list. Shopping  Buy products labeled as "low-sodium" or "no salt added."  Buy fresh foods. Avoid canned foods and premade or frozen meals. Cooking  Avoid adding salt when cooking.  Use salt-free seasonings or herbs instead of table salt or sea salt. Check with your health care provider or pharmacist before using salt substitutes.  Do not fry foods. Cook foods using healthy methods such as baking, boiling, grilling, and broiling instead.  Cook with heart-healthy oils, such as olive, canola, soybean, or sunflower oil. Meal planning   Eat a balanced diet that includes: ? 5 or more servings of fruits and vegetables each day. At each meal, try to fill half of your plate with fruits and vegetables. ? Up to 6-8 servings of whole grains each day. ? Less than 6 oz of lean meat, poultry, or fish each day. A 3-oz serving of meat is about the same size as a deck of cards. One egg equals 1 oz. ? 2 servings of low-fat dairy each day. ?  A serving of nuts, seeds, or beans 5 times each week. ? Heart-healthy fats. Healthy fats called Omega-3 fatty acids are found in foods such as flaxseeds and coldwater fish, like sardines, salmon, and mackerel.  Limit how much you eat of the following: ? Canned or prepackaged foods. ? Food that is high in trans fat, such as fried foods. ? Food that is high in saturated fat, such as fatty meat. ? Sweets, desserts, sugary drinks, and other foods with added sugar. ? Full-fat dairy products.  Do not salt foods before eating.  Try to eat at least 2 vegetarian meals each week.  Eat more home-cooked food and less restaurant, buffet, and fast food.  When eating at a restaurant, ask that your food be prepared with less salt or no salt, if possible. What foods are recommended? The items listed may not be a complete list. Talk with your dietitian about what dietary choices are best for you. Grains Whole-grain or whole-wheat bread. Whole-grain or whole-wheat pasta. Brown rice. [place]. Bulgur. Whole-grain and low-sodium cereals. Pita bread. Low-fat, low-sodium crackers. Whole-wheat flour tortillas. Vegetables Fresh or frozen vegetables (raw,  steamed, roasted, or grilled). Low-sodium or reduced-sodium tomato and vegetable juice. Low-sodium or reduced-sodium tomato sauce and tomato paste. Low-sodium or reduced-sodium canned vegetables. Fruits All fresh, dried, or frozen fruit. Canned fruit in natural juice (without added sugar). Meat and other protein foods Skinless chicken or [place]. Ground chicken or [place]. Pork with fat trimmed off. Fish and seafood. Egg whites. Dried beans, peas, or lentils. Unsalted nuts, nut butters, and seeds. Unsalted canned beans. Lean cuts of beef with fat trimmed off. Low-sodium, lean deli meat. Dairy Low-fat (1%) or fat-free (skim) milk. Fat-free, low-fat, or reduced-fat cheeses. Nonfat, low-sodium ricotta or cottage cheese. Low-fat or nonfat yogurt. Low-fat, low-sodium cheese. Fats and oils Soft margarine without trans fats. Vegetable oil. Low-fat, reduced-fat, or light mayonnaise and salad dressings (reduced-sodium). Canola, safflower, olive, soybean, and sunflower oils. Avocado. Seasoning and other foods Herbs. Spices. Seasoning mixes without salt. Unsalted popcorn and pretzels. Fat-free sweets. What foods are not recommended? The items listed may not be a complete list. Talk with your dietitian about what dietary choices are best for you. Grains Baked goods made with fat, such as croissants, muffins, or some breads. Dry pasta or rice meal packs. Vegetables Creamed or fried vegetables. Vegetables in a cheese sauce. Regular canned vegetables (not low-sodium or reduced-sodium). Regular canned tomato sauce and paste (not low-sodium or reduced-sodium). Regular tomato and vegetable juice (not low-sodium or reduced-sodium). Angie Fava. Olives. Fruits Canned fruit in a light or heavy syrup. Fried fruit. Fruit in cream or butter sauce. Meat and other protein foods Fatty cuts of meat. Ribs. Fried meat. [place] Salines. Sausage. Bologna and other processed lunch meats. Salami. [place]back. Hotdogs. Bratwurst. Salted nuts and  seeds. Canned beans with added salt. Canned or smoked fish. Whole eggs or egg yolks. Chicken or [place] with skin. Dairy Whole or 2% milk, cream, and half-and-half. Whole or full-fat cream cheese. Whole-fat or sweetened yogurt. Full-fat cheese. Nondairy creamers. Whipped toppings. Processed cheese and cheese spreads. Fats and oils Butter. Stick margarine. Lard. Shortening. Ghee. Bacon fat. Tropical oils, such as coconut, palm kernel, or palm oil. Seasoning and other foods Salted popcorn and pretzels. Onion salt, garlic salt, seasoned salt, table salt, and sea salt. Worcestershire sauce. Tartar sauce. Barbecue sauce. Teriyaki sauce. Soy sauce, including reduced-sodium. Steak sauce. Canned and packaged gravies. Fish sauce. Oyster sauce. Cocktail sauce. Horseradish that you find on the shelf. Ketchup.  Mustard. Meat flavorings and tenderizers. Bouillon cubes. Hot sauce and Tabasco sauce. Premade or packaged marinades. Premade or packaged taco seasonings. Relishes. Regular salad dressings. Where to find more information:  National Heart, Lung, and [place]: https://wilson-eaton.com/  American Heart Association: www.heart.org Summary  The DASH eating plan is a healthy eating plan that has been shown to reduce high blood pressure (hypertension). It may also reduce your risk for type 2 diabetes, heart disease, and stroke.  With the DASH eating plan, you should limit salt (sodium) intake to 2,300 mg a day. If you have hypertension, you may need to reduce your sodium intake to 1,500 mg a day.  When on the DASH eating plan, aim to eat more fresh fruits and vegetables, whole grains, lean proteins, low-fat dairy, and heart-healthy fats.  Work with your health care provider or diet and nutrition specialist (dietitian) to adjust your eating plan to your individual calorie needs. This information is not intended to replace advice given to you by your health care provider. Make sure you discuss any questions you  have with your health care provider. Document Released: 03/04/2011 Document Revised: 03/08/2016 Document Reviewed: 03/08/2016 Elsevier Interactive Patient Education  [employer].

## 2017-08-29 NOTE — Assessment & Plan Note (Signed)
Encouraged heart healthy diet, increase exercise, avoid trans fats, consider a krill oil cap daily 

## 2017-08-29 NOTE — Assessment & Plan Note (Signed)
Has a colonoscopy scheduled soon

## 2017-08-29 NOTE — Assessment & Plan Note (Signed)
Taking a daily Multivitamin no weekly dose at this time. Check level today

## 2017-08-29 NOTE — Assessment & Plan Note (Signed)
Well controlled, no changes to meds. Encouraged heart healthy diet such as the DASH diet and exercise as tolerated.  °

## 2017-09-02 MED ORDER — VITAMIN D (ERGOCALCIFEROL) 1.25 MG (50000 UNIT) PO CAPS: 50000.0000 [IU] | ORAL_CAPSULE | ORAL | 0 refills | Status: DC

## 2017-09-02 NOTE — Addendum Note (Signed)
Addended by: Magdalene Molly A on: 09/02/2017 02:04 PM   Modules accepted: Orders

## 2017-09-08 ENCOUNTER — Telehealth: Payer: Self-pay | Admitting: *Deleted

## 2017-09-08 NOTE — Telephone Encounter (Signed)
Received Medical records from [facility]; forwarded to provider/SLS 06/13

## 2017-09-27 ENCOUNTER — Encounter: Payer: Self-pay | Admitting: *Deleted

## 2017-10-05 ENCOUNTER — Ambulatory Visit (AMBULATORY_SURGERY_CENTER): Payer: Self-pay | Admitting: *Deleted

## 2017-10-05 VITALS — Ht 63.0 in | Wt 226.8 lb

## 2017-10-05 DIAGNOSIS — Z86010 Personal history of colonic polyps: Secondary | ICD-10-CM

## 2017-10-05 MED ORDER — NA SULFATE-K SULFATE-MG SULF 17.5-3.13-1.6 GM/177ML PO SOLN: 1.0000 | Freq: Once | ORAL | 0 refills | Status: AC

## 2017-10-05 MED FILL — SUPREP BOWEL PREP KIT: 17.5-3.13-1 | 1 days supply | Qty: 354 | Fill #0

## 2017-10-10 ENCOUNTER — Encounter: Payer: Self-pay | Admitting: Gastroenterology

## 2017-10-19 ENCOUNTER — Encounter: Payer: Self-pay | Admitting: Gastroenterology

## 2017-10-19 ENCOUNTER — Ambulatory Visit (AMBULATORY_SURGERY_CENTER): Payer: BLUE CROSS/BLUE SHIELD | Admitting: Gastroenterology

## 2017-10-19 VITALS — BP 125/70 | HR 75 | Temp 98.0°F | Resp 16 | Ht 63.0 in | Wt 226.0 lb

## 2017-10-19 DIAGNOSIS — D124 Benign neoplasm of descending colon: Secondary | ICD-10-CM

## 2017-10-19 DIAGNOSIS — Z86010 Personal history of colonic polyps: Secondary | ICD-10-CM

## 2017-10-19 DIAGNOSIS — Z1211 Encounter for screening for malignant neoplasm of colon: Secondary | ICD-10-CM | POA: Diagnosis not present

## 2017-10-19 MED ORDER — SODIUM CHLORIDE 0.9 % IV SOLN: 500.0000 mL | Freq: Once | INTRAVENOUS | Status: DC

## 2017-10-19 NOTE — Progress Notes (Signed)
To PACU, VSS. Report to [email or web address] 

## 2017-10-19 NOTE — Progress Notes (Signed)
Called to room to assist during endoscopic procedure.  Patient ID and intended procedure confirmed with present staff. Received instructions for my participation in the procedure from the performing physician.  

## 2017-10-19 NOTE — Patient Instructions (Signed)
Handouts given on polyps and hemorrhoids   YOU HAD AN ENDOSCOPIC PROCEDURE TODAY AT THE [facility]:   Refer to the procedure report that was given to you for any specific questions about what was found during the examination.  If the procedure report does not answer your questions, please call your gastroenterologist to clarify.  If you requested that your care partner not be given the details of your procedure findings, then the procedure report has been included in a sealed envelope for you to review at your convenience later.  YOU SHOULD EXPECT: Some feelings of bloating in the abdomen. Passage of more gas than usual.  Walking can help get rid of the air that was put into your GI tract during the procedure and reduce the bloating. If you had a lower endoscopy (such as a colonoscopy or flexible sigmoidoscopy) you may notice spotting of blood in your stool or on the toilet paper. If you underwent a bowel prep for your procedure, you may not have a normal bowel movement for a few days.  Please Note:  You might notice some irritation and congestion in your nose or some drainage.  This is from the oxygen used during your procedure.  There is no need for concern and it should clear up in a day or so.  SYMPTOMS TO REPORT IMMEDIATELY:   Following lower endoscopy (colonoscopy or flexible sigmoidoscopy):  Excessive amounts of blood in the stool  Significant tenderness or worsening of abdominal pains  Swelling of the abdomen that is new, acute  Fever of 100F or higher    For urgent or emergent issues, a gastroenterologist can be reached at any hour by calling [phone].   DIET:  We do recommend a small meal at first, but then you may proceed to your regular diet.  Drink plenty of fluids but you should avoid alcoholic beverages for 24 hours.  ACTIVITY:  You should plan to take it easy for the rest of today and you should NOT DRIVE or use heavy machinery until tomorrow (because of  the sedation medicines used during the test).    FOLLOW UP: Our staff will call the number listed on your records the next business day following your procedure to check on you and address any questions or concerns that you may have regarding the information given to you following your procedure. If we do not reach you, we will leave a message.  However, if you are feeling well and you are not experiencing any problems, there is no need to return our call.  We will assume that you have returned to your regular daily activities without incident.  If any biopsies were taken you will be contacted by phone or by letter within the next 1-3 weeks.  Please call us at [phone] if you have not heard about the biopsies in 3 weeks.    SIGNATURES/CONFIDENTIALITY: You and/or your care partner have signed paperwork which will be entered into your electronic medical record.  These signatures attest to the fact that that the information above on your After Visit Summary has been reviewed and is understood.  Full responsibility of the confidentiality of this discharge information lies with you and/or your care-partner. 

## 2017-10-19 NOTE — Progress Notes (Signed)
Pt's states no medical or surgical changes since previsit or office visit. 

## 2017-10-19 NOTE — Op Note (Signed)
[place] Patient Name: Sherri Sullivan Procedure Date: 10/19/2017 12:01 PM MRN: [id number] Endoscopist: Mauri Pole , MD Age: 57 Referring MD:  Date of Birth: 1960/08/26 Gender: Female Account #: [id number] Procedure:                Colonoscopy Indications:              High risk colon cancer surveillance: Personal                            history of colonic polyps, High risk colon cancer                            surveillance: Personal history of adenoma less than                            10 mm in size Medicines:                Monitored Anesthesia Care Procedure:                Pre-Anesthesia Assessment:                           - Prior to the procedure, a History and Physical                            was performed, and patient medications and                            allergies were reviewed. The patient's tolerance of                            previous anesthesia was also reviewed. The risks                            and benefits of the procedure and the sedation                            options and risks were discussed with the patient.                            All questions were answered, and informed consent                            was obtained. Prior Anticoagulants: The patient has                            taken no previous anticoagulant or antiplatelet                            agents. ASA Grade Assessment: II - A patient with                            mild systemic disease. After reviewing the risks  and benefits, the patient was deemed in                            satisfactory condition to undergo the procedure.                           After obtaining informed consent, the colonoscope                            was passed under direct vision. Throughout the                            procedure, the patient's blood pressure, pulse, and                            oxygen saturations were monitored  continuously. The                            Colonoscope was introduced through the anus and                            advanced to the the cecum, identified by                            appendiceal orifice and ileocecal valve. The                            colonoscopy was performed without difficulty. The                            patient tolerated the procedure well. The quality                            of the bowel preparation was adequate. The                            ileocecal valve, appendiceal orifice, and rectum                            were photographed. Scope In: 12:13:43 PM Scope Out: 12:36:24 PM Scope Withdrawal Time: 0 hours 14 minutes 52 seconds  Total Procedure Duration: 0 hours 22 minutes 41 seconds  Findings:                 The perianal and digital rectal examinations were                            normal.                           A 3 mm polyp was found in the descending colon. The                            polyp was sessile. The polyp was removed with a  cold biopsy forceps. Resection and retrieval were                            complete.                           Non-bleeding internal hemorrhoids were found during                            retroflexion. The hemorrhoids were small. Complications:            No immediate complications. Estimated Blood Loss:     Estimated blood loss was minimal. Impression:               - One 3 mm polyp in the descending colon, removed                            with a cold biopsy forceps. Resected and retrieved.                           - Non-bleeding internal hemorrhoids. Recommendation:           - Patient has a contact number available for                            emergencies. The signs and symptoms of potential                            delayed complications were discussed with the                            patient. Return to normal activities tomorrow.                             Written discharge instructions were provided to the                            patient.                           - Resume previous diet.                           - Continue present medications.                           - Await pathology results.                           - Repeat colonoscopy in 5 years for surveillance                            based on pathology results. Mauri Pole, MD 10/19/2017 12:44:24 PM This report has been signed electronically.

## 2017-10-20 ENCOUNTER — Telehealth: Payer: Self-pay | Admitting: *Deleted

## 2017-10-20 NOTE — Telephone Encounter (Signed)
  Follow up Call-  Call back number 10/19/2017  Post procedure Call Back phone  # [phone]  Permission to leave phone message Yes  Some recent data might be hidden     Patient questions:  Do you have a fever, pain , or abdominal swelling? No. Pain Score  0 *  Have you tolerated food without any problems? Yes.    Have you been able to return to your normal activities? Yes.    Do you have any questions about your discharge instructions: Diet   No. Medications  No. Follow up visit  No.  Do you have questions or concerns about your Care? No.  Actions: * If pain score is 4 or above: No action needed, pain <4.

## 2017-10-25 ENCOUNTER — Encounter: Payer: Self-pay | Admitting: Gastroenterology

## 2017-12-22 ENCOUNTER — Other Ambulatory Visit: Payer: Self-pay | Admitting: Family Medicine

## 2018-01-09 ENCOUNTER — Ambulatory Visit (INDEPENDENT_AMBULATORY_CARE_PROVIDER_SITE_OTHER): Payer: BLUE CROSS/BLUE SHIELD | Admitting: Family Medicine

## 2018-01-09 ENCOUNTER — Encounter: Payer: Self-pay | Admitting: Family Medicine

## 2018-01-09 VITALS — BP 112/68 | HR 79 | Temp 98.1°F | Resp 18 | Ht 63.0 in | Wt 231.1 lb

## 2018-01-09 DIAGNOSIS — I10 Essential (primary) hypertension: Secondary | ICD-10-CM

## 2018-01-09 DIAGNOSIS — Z7289 Other problems related to lifestyle: Secondary | ICD-10-CM

## 2018-01-09 DIAGNOSIS — Z23 Encounter for immunization: Secondary | ICD-10-CM

## 2018-01-09 DIAGNOSIS — E785 Hyperlipidemia, unspecified: Secondary | ICD-10-CM | POA: Diagnosis not present

## 2018-01-09 DIAGNOSIS — R739 Hyperglycemia, unspecified: Secondary | ICD-10-CM

## 2018-01-09 DIAGNOSIS — E559 Vitamin D deficiency, unspecified: Secondary | ICD-10-CM

## 2018-01-09 DIAGNOSIS — E669 Obesity, unspecified: Secondary | ICD-10-CM

## 2018-01-09 DIAGNOSIS — Z0000 Encounter for general adult medical examination without abnormal findings: Secondary | ICD-10-CM

## 2018-01-09 LAB — LIPID PANEL
CHOL/HDL RATIO: 3
Cholesterol: 204 mg/dL — ABNORMAL HIGH (ref 0–200)
HDL: 59.6 mg/dL (ref >39.00)
LDL CALC: 115 mg/dL — AB (ref 0–99)
NONHDL: 144.11
Triglycerides: 145 mg/dL (ref 0.0–149.0)
VLDL: 29 mg/dL (ref 0.0–40.0)

## 2018-01-09 LAB — CBC
HEMATOCRIT: 39.9 % (ref 36.0–46.0)
Hemoglobin: 13.1 g/dL (ref 12.0–15.0)
MCHC: 32.9 g/dL (ref 30.0–36.0)
MCV: 89.8 fl (ref 78.0–100.0)
Platelets: 227 10*3/uL (ref 150.0–400.0)
RBC: 4.44 Mil/uL (ref 3.87–5.11)
RDW: 15.1 % (ref 11.5–15.5)
WBC: 5.4 10*3/uL (ref 4.0–10.5)

## 2018-01-09 LAB — COMPREHENSIVE METABOLIC PANEL
ALK PHOS: 47 U/L (ref 39–117)
ALT: 12 U/L (ref 0–35)
AST: 15 U/L (ref 0–37)
Albumin: 3.9 g/dL (ref 3.5–5.2)
BUN: 16 mg/dL (ref 6–23)
CHLORIDE: 105 meq/L (ref 96–112)
CO2: 27 meq/L (ref 19–32)
Calcium: 9.7 mg/dL (ref 8.4–10.5)
Creatinine, Ser: 0.62 mg/dL (ref 0.40–1.20)
GFR: 127.35 mL/min (ref >60.00)
GLUCOSE: 103 mg/dL — AB (ref 70–99)
POTASSIUM: 3.9 meq/L (ref 3.5–5.1)
SODIUM: 138 meq/L (ref 135–145)
TOTAL PROTEIN: 6.9 g/dL (ref 6.0–8.3)
Total Bilirubin: 0.8 mg/dL (ref 0.2–1.2)

## 2018-01-09 LAB — HEMOGLOBIN A1C: Hgb A1c MFr Bld: 5.3 % (ref 4.6–6.5)

## 2018-01-09 LAB — TSH: TSH: 1.55 u[IU]/mL (ref 0.35–4.50)

## 2018-01-09 LAB — VITAMIN D 25 HYDROXY (VIT D DEFICIENCY, FRACTURES): VITD: 37.78 ng/mL (ref 30.00–100.00)

## 2018-01-09 NOTE — Progress Notes (Signed)
Subjective:    Patient ID: Sherri Sullivan, female    DOB: 04/05/1960, 57 y.o.   MRN: [id number]  No chief complaint on file.   HPI Patient is in today for follow-up on chronic medical concerns.  Overall she feels well.  She has completed her course of vitamin D 50,000 IU and is now taking 2000 IUs daily and tolerating this.  She continues to struggle with her weight and while she tries to maintain a heart healthy diet knowledges does not follow it regularly.  Is not exercising regularly.  No recent febrile illness or hospitalizations.  No acute concerns.  No complaints of polyuria or polydipsia. Denies CP/palp/SOB/HA/congestion/fevers/GI or GU c/o. Taking meds as prescribed  Past Medical History:  Diagnosis Date  . Anginal pain ([place])   . 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 Back pain   . Chicken pox as a child  . Constipation   . Dizziness   . Dysrhythmia 2013   SVT- treated by ablation by Dr. Lovena Le to f/u with as needed basis   . Encounter for preventative adult health care exam with abnormal findings 02/09/2015  . HTN (hypertension)   . Hypokalemia 02/09/2015  . Joint pain   . Leg edema   . Measles as a child  . Measles as a child  . Obesity   . Panic attack    during episode of feeling to crowded   . Preventative health care 02/09/2015  . Seasonal allergies   . SOB (shortness of breath) 09/23/11   "a little bit; at rest; before ablation", SOB again now (08/2014- due to lack  of exercise)   . SVT (supraventricular tachycardia) ([place])   . Uterine fibroid 12/17/2012   Cervical polyp per patient Follows with [facility], Dr Evette Cristal    Past Surgical History:  Procedure Laterality Date  . ABDOMINAL HYSTERECTOMY  1990's  . CARDIAC ELECTROPHYSIOLOGY STUDY AND ABLATION  09/23/11  . MAXIMUM ACCESS (MAS)POSTERIOR LUMBAR INTERBODY FUSION (PLIF) 1 LEVEL N/A 09/24/2014   Procedure: L5-S1 MAS PLIF ;  Surgeon: Erline Levine,  MD;  Location: [facility];  Service: Neurosurgery;  Laterality: N/A;  L5-S1 MAS PLIF fusion  . SUPRAVENTRICULAR TACHYCARDIA ABLATION N/A 09/23/2011   Procedure: SUPRAVENTRICULAR TACHYCARDIA ABLATION;  Surgeon: Evans Lance, MD;  Location: [facility] CATH LAB;  Service: Cardiovascular;  Laterality: N/A;  . TUBAL LIGATION  1980's    Family History  Problem Relation Age of Onset  . Cancer Sister        lung  . Diabetes Mother        type 2  . Heart disease Mother   . Hypertension Mother   . Stroke Mother   . Obesity Mother   . Heart disease Father   . Obesity Father   . Hypertension Daughter   . Stroke Maternal Grandmother   . Diabetes Maternal Aunt   . Kidney disease Maternal Aunt   . Diabetes Maternal Uncle   . Colon cancer Neg Hx   . Esophageal cancer Neg Hx   . Rectal cancer Neg Hx   . Stomach cancer Neg Hx     Social History   Socioeconomic History  . Marital status: Married    Spouse name: Marcello Moores  . Number of children: 2  . Years of education: Not on file  . Highest education level: Not on file  Occupational History  . Occupation: Retired  Scientific laboratory technician  .  Financial resource strain: Not on file  . Food insecurity:    Worry: Not on file    Inability: Not on file  . Transportation needs:    Medical: Not on file    Non-medical: Not on file  Tobacco Use  . Smoking status: Former Smoker    Packs/day: 0.12    Years: 8.00    Pack years: 0.96    Types: Cigarettes    Last attempt to quit: 03/29/1984    Years since quitting: 33.8  . Smokeless tobacco: Never Used  Substance and Sexual Activity  . Alcohol use: Yes    Alcohol/week: 14.0 standard drinks    Types: 14 Glasses of wine per week    Comment: Drinks 2-3 glasses wine on the weekends  . Drug use: No  . Sexual activity: Not Currently  Lifestyle  . Physical activity:    Days per week: Not on file    Minutes per session: Not on file  . Stress: Not on file  Relationships  . Social connections:    Talks on phone:  Not on file    Gets together: Not on file    Attends religious service: Not on file    Active member of club or organization: Not on file    Attends meetings of clubs or organizations: Not on file    Relationship status: Not on file  . Intimate partner violence:    Fear of current or ex partner: Not on file    Emotionally abused: Not on file    Physically abused: Not on file    Forced sexual activity: Not on file  Other Topics Concern  . Not on file  Social History Narrative   No major restrictions   Lives with husband, daughter, grand daughter in a 2 story home.    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    Outpatient Medications Prior to Visit  Medication Sig Dispense Refill  . cetirizine (ZYRTEC) 10 MG tablet Take 10 mg by mouth daily.    . diclofenac (VOLTAREN) 75 MG EC tablet Take 1 tablet (75 mg total) by mouth 2 (two) times daily. 180 tablet 1  . estradiol (ESTRACE) 2 MG tablet Take 1 tablet (2 mg total) by mouth daily. 90 tablet 1  . fluticasone (FLONASE) 50 MCG/ACT nasal spray Place 2 sprays into both nostrils daily. 16 g 1  . Magnesium Citrate 100 MG TABS Take 2-3 tablets by mouth daily as needed.    . methocarbamol (ROBAXIN) 500 MG tablet Take 1 tablet (500 mg total) by mouth every 6 (six) hours as needed for muscle spasms. 90 tablet 0  . OMEGA-3 KRILL OIL PO Take 353 mg by mouth daily. Mega[facility]    . potassium chloride SA (K-DUR,KLOR-CON) 20 MEQ tablet TAKE 1 TABLET BY MOUTH TWO  TIMES DAILY 180 tablet 1  . Probiotic Product (PROBIOTIC PO) Take 2 tablets by mouth at bedtime.    . triamterene-hydrochlorothiazide (MAXZIDE-25) 37.5-25 MG tablet Take 1 tablet by mouth daily. 90 tablet 1  . Vitamin D, Ergocalciferol, (DRISDOL) 50000 units CAPS capsule Take 1 capsule (50,000 Units total) by mouth every 7 (seven) days. 12 capsule 0  . Wheat Dextrin (BENEFIBER) POWD Take 2-3 scoop by mouth daily as needed.     No facility-administered  medications prior to visit.     Allergies  Allergen Reactions  . Doxycycline Diarrhea and Nausea And Vomiting    Review  of Systems  Constitutional: Negative for fever and malaise/fatigue.  HENT: Negative for congestion.   Eyes: Negative for blurred vision.  Respiratory: Negative for shortness of breath.   Cardiovascular: Negative for chest pain, palpitations and leg swelling.  Gastrointestinal: Negative for abdominal pain, blood in stool and nausea.  Genitourinary: Negative for dysuria and frequency.  Musculoskeletal: Negative for falls.  Skin: Negative for rash.  Neurological: Negative for dizziness, loss of consciousness and headaches.  Endo/Heme/Allergies: Negative for environmental allergies.  Psychiatric/Behavioral: Negative for depression. The patient is not nervous/anxious.        Objective:    Physical Exam  Constitutional: She is oriented to person, place, and time. She appears well-developed and well-nourished. No distress.  HENT:  Head: Normocephalic and atraumatic.  Nose: Nose normal.  Eyes: Right eye exhibits no discharge. Left eye exhibits no discharge.  Neck: Normal range of motion. Neck supple.  Cardiovascular: Normal rate and regular rhythm.  No murmur heard. Pulmonary/Chest: Effort normal and breath sounds normal.  Abdominal: Soft. Bowel sounds are normal. There is no tenderness.  Musculoskeletal: She exhibits no edema.  Neurological: She is alert and oriented to person, place, and time.  Skin: Skin is warm and dry.  Psychiatric: She has a normal mood and affect.  Nursing note and vitals reviewed.   BP 112/68 (BP Location: Left Arm, Patient Position: Sitting, Cuff Size: Normal)   Pulse 79   Temp 98.1 F (36.7 C) (Oral)   Resp 18   Ht 5\' 3"  (1.6 m)   Wt 231 lb 1.6 oz (104.8 kg)   SpO2 95%   BMI 40.94 kg/m  Wt Readings from Last 3 Encounters:  01/09/18 231 lb 1.6 oz (104.8 kg)  10/19/17 226 lb (102.5 kg)  10/05/17 226 lb 12.8 oz (102.9 kg)      Lab Results  Component Value Date   WBC 5.3 08/29/2017   HGB 13.2 08/29/2017   HCT 39.8 08/29/2017   PLT 245.0 08/29/2017   GLUCOSE 90 08/29/2017   CHOL 241 (H) 08/29/2017   TRIG 239.0 (H) 08/29/2017   HDL 59.30 08/29/2017   LDLDIRECT 143.0 08/29/2017   LDLCALC 127 (H) 01/18/2017   ALT 11 08/29/2017   AST 15 08/29/2017   NA 138 08/29/2017   K 4.0 08/29/2017   CL 104 08/29/2017   CREATININE 0.63 08/29/2017   BUN 15 08/29/2017   CO2 25 08/29/2017   TSH 1.10 08/29/2017   HGBA1C 5.5 08/29/2017    Lab Results  Component Value Date   TSH 1.10 08/29/2017   Lab Results  Component Value Date   WBC 5.3 08/29/2017   HGB 13.2 08/29/2017   HCT 39.8 08/29/2017   MCV 88.9 08/29/2017   PLT 245.0 08/29/2017   Lab Results  Component Value Date   NA 138 08/29/2017   K 4.0 08/29/2017   CO2 25 08/29/2017   GLUCOSE 90 08/29/2017   BUN 15 08/29/2017   CREATININE 0.63 08/29/2017   BILITOT 0.7 08/29/2017   ALKPHOS 52 08/29/2017   AST 15 08/29/2017   ALT 11 08/29/2017   PROT 7.0 08/29/2017   ALBUMIN 4.2 08/29/2017   CALCIUM 10.0 08/29/2017   ANIONGAP 9 09/13/2014   GFR 125.18 08/29/2017   Lab Results  Component Value Date   CHOL 241 (H) 08/29/2017   Lab Results  Component Value Date   HDL 59.30 08/29/2017   Lab Results  Component Value Date   LDLCALC 127 (H) 01/18/2017   Lab Results  Component Value Date  TRIG 239.0 (H) 08/29/2017   Lab Results  Component Value Date   CHOLHDL 4 08/29/2017   Lab Results  Component Value Date   HGBA1C 5.5 08/29/2017       Assessment & Plan:   Problem List Items Addressed This Visit    Hyperlipidemia    Encouraged heart healthy diet, increase exercise, avoid trans fats, consider a krill oil cap daily      Relevant Orders   Lipid panel   Obesity    Encouraged DASH diet, decrease po intake and increase exercise as tolerated. Needs 7-8 hours of sleep nightly. Avoid trans fats, eat small, frequent meals every 4-5  hours with lean proteins, complex carbs and healthy fats. Minimize simple carbs      Essential hypertension, benign    Well controlled, no changes to meds. Encouraged heart healthy diet such as the DASH diet and exercise as tolerated.       Relevant Orders   CBC   Comprehensive metabolic panel   TSH   Preventative health care    Patient requesting hep c testing per CDC guide lines. Also will give immunizations today      Vitamin D deficiency    Taking Vitamin 2000 IU daily, has completed the weekly dosing a while ago. Will check level today      Relevant Orders   VITAMIN D 25 Hydroxy (Vit-D Deficiency, Fractures)   Hyperglycemia    hgba1c acceptable, minimize simple carbs. Increase exercise as tolerated.      Relevant Orders   Hemoglobin A1c    Other Visit Diagnoses    Other problems related to lifestyle    -  Primary   Relevant Orders   Hepatitis C antibody      I am having Domingo Sep. Garnett maintain her OMEGA-3 KRILL OIL PO, cetirizine, Probiotic Product (PROBIOTIC PO), BENEFIBER, Magnesium Citrate, methocarbamol, fluticasone, diclofenac, potassium chloride SA, Vitamin D (Ergocalciferol), estradiol, and triamterene-hydrochlorothiazide.  No orders of the defined types were placed in this encounter.    Penni Homans, MD

## 2018-01-09 NOTE — Assessment & Plan Note (Signed)
Taking Vitamin 2000 IU daily, has completed the weekly dosing a while ago. Will check level today

## 2018-01-09 NOTE — Assessment & Plan Note (Signed)
Well controlled, no changes to meds. Encouraged heart healthy diet such as the DASH diet and exercise as tolerated.  °

## 2018-01-09 NOTE — Assessment & Plan Note (Signed)
hgba1c acceptable, minimize simple carbs. Increase exercise as tolerated.  

## 2018-01-09 NOTE — Assessment & Plan Note (Signed)
Encouraged DASH diet, decrease po intake and increase exercise as tolerated. Needs 7-8 hours of sleep nightly. Avoid trans fats, eat small, frequent meals every 4-5 hours with lean proteins, complex carbs and healthy fats. Minimize simple carbs 

## 2018-01-09 NOTE — Assessment & Plan Note (Signed)
Patient requesting hep c testing per CDC guide lines. Also will give immunizations today

## 2018-01-09 NOTE — Patient Instructions (Signed)
60-80 ounces of clear fluids daily   DASH Eating Plan DASH stands for "Dietary Approaches to Stop Hypertension." The DASH eating plan is a healthy eating plan that has been shown to reduce high blood pressure (hypertension). It may also reduce your risk for type 2 diabetes, heart disease, and stroke. The DASH eating plan may also help with weight loss. What are tips for following this plan? General guidelines  Avoid eating more than 2,300 mg (milligrams) of salt (sodium) a day. If you have hypertension, you may need to reduce your sodium intake to 1,500 mg a day.  Limit alcohol intake to no more than 1 drink a day for nonpregnant women and 2 drinks a day for men. One drink equals 12 oz of beer, 5 oz of wine, or 1 oz of hard liquor.  Work with your health care provider to maintain a healthy body weight or to lose weight. Ask what an ideal weight is for you.  Get at least 30 minutes of exercise that causes your heart to beat faster (aerobic exercise) most days of the week. Activities may include walking, swimming, or biking.  Work with your health care provider or diet and nutrition specialist (dietitian) to adjust your eating plan to your individual calorie needs. Reading food labels  Check food labels for the amount of sodium per serving. Choose foods with less than 5 percent of the Daily Value of sodium. Generally, foods with less than 300 mg of sodium per serving fit into this eating plan.  To find whole grains, look for the word "whole" as the first word in the ingredient list. Shopping  Buy products labeled as "low-sodium" or "no salt added."  Buy fresh foods. Avoid canned foods and premade or frozen meals. Cooking  Avoid adding salt when cooking. Use salt-free seasonings or herbs instead of table salt or sea salt. Check with your health care provider or pharmacist before using salt substitutes.  Do not fry foods. Cook foods using healthy methods such as baking, boiling, grilling,  and broiling instead.  Cook with heart-healthy oils, such as olive, canola, soybean, or sunflower oil. Meal planning   Eat a balanced diet that includes: ? 5 or more servings of fruits and vegetables each day. At each meal, try to fill half of your plate with fruits and vegetables. ? Up to 6-8 servings of whole grains each day. ? Less than 6 oz of lean meat, poultry, or fish each day. A 3-oz serving of meat is about the same size as a deck of cards. One egg equals 1 oz. ? 2 servings of low-fat dairy each day. ? A serving of nuts, seeds, or beans 5 times each week. ? Heart-healthy fats. Healthy fats called Omega-3 fatty acids are found in foods such as flaxseeds and coldwater fish, like sardines, salmon, and mackerel.  Limit how much you eat of the following: ? Canned or prepackaged foods. ? Food that is high in trans fat, such as fried foods. ? Food that is high in saturated fat, such as fatty meat. ? Sweets, desserts, sugary drinks, and other foods with added sugar. ? Full-fat dairy products.  Do not salt foods before eating.  Try to eat at least 2 vegetarian meals each week.  Eat more home-cooked food and less restaurant, buffet, and fast food.  When eating at a restaurant, ask that your food be prepared with less salt or no salt, if possible. What foods are recommended? The items listed may not be a  complete list. Talk with your dietitian about what dietary choices are best for you. Grains Whole-grain or whole-wheat bread. Whole-grain or whole-wheat pasta. Brown rice. Modena Morrow. Bulgur. Whole-grain and low-sodium cereals. Pita bread. Low-fat, low-sodium crackers. Whole-wheat flour tortillas. Vegetables Fresh or frozen vegetables (raw, steamed, roasted, or grilled). Low-sodium or reduced-sodium tomato and vegetable juice. Low-sodium or reduced-sodium tomato sauce and tomato paste. Low-sodium or reduced-sodium canned vegetables. Fruits All fresh, dried, or frozen fruit.  Canned fruit in natural juice (without added sugar). Meat and other protein foods Skinless chicken or [place]. Ground chicken or [place]. Pork with fat trimmed off. Fish and seafood. Egg whites. Dried beans, peas, or lentils. Unsalted nuts, nut butters, and seeds. Unsalted canned beans. Lean cuts of beef with fat trimmed off. Low-sodium, lean deli meat. Dairy Low-fat (1%) or fat-free (skim) milk. Fat-free, low-fat, or reduced-fat cheeses. Nonfat, low-sodium ricotta or cottage cheese. Low-fat or nonfat yogurt. Low-fat, low-sodium cheese. Fats and oils Soft margarine without trans fats. Vegetable oil. Low-fat, reduced-fat, or light mayonnaise and salad dressings (reduced-sodium). Canola, safflower, olive, soybean, and sunflower oils. Avocado. Seasoning and other foods Herbs. Spices. Seasoning mixes without salt. Unsalted popcorn and pretzels. Fat-free sweets. What foods are not recommended? The items listed may not be a complete list. Talk with your dietitian about what dietary choices are best for you. Grains Baked goods made with fat, such as croissants, muffins, or some breads. Dry pasta or rice meal packs. Vegetables Creamed or fried vegetables. Vegetables in a cheese sauce. Regular canned vegetables (not low-sodium or reduced-sodium). Regular canned tomato sauce and paste (not low-sodium or reduced-sodium). Regular tomato and vegetable juice (not low-sodium or reduced-sodium). Angie Fava. Olives. Fruits Canned fruit in a light or heavy syrup. Fried fruit. Fruit in cream or butter sauce. Meat and other protein foods Fatty cuts of meat. Ribs. Fried meat. Berniece Salines. Sausage. Bologna and other processed lunch meats. Salami. Fatback. Hotdogs. Bratwurst. Salted nuts and seeds. Canned beans with added salt. Canned or smoked fish. Whole eggs or egg yolks. Chicken or [place] with skin. Dairy Whole or 2% milk, cream, and half-and-half. Whole or full-fat cream cheese. Whole-fat or sweetened yogurt. Full-fat  cheese. Nondairy creamers. Whipped toppings. Processed cheese and cheese spreads. Fats and oils Butter. Stick margarine. Lard. Shortening. Ghee. Bacon fat. Tropical oils, such as coconut, palm kernel, or palm oil. Seasoning and other foods Salted popcorn and pretzels. Onion salt, garlic salt, seasoned salt, table salt, and sea salt. Worcestershire sauce. Tartar sauce. Barbecue sauce. Teriyaki sauce. Soy sauce, including reduced-sodium. Steak sauce. Canned and packaged gravies. Fish sauce. Oyster sauce. Cocktail sauce. Horseradish that you find on the shelf. Ketchup. Mustard. Meat flavorings and tenderizers. Bouillon cubes. Hot sauce and Tabasco sauce. Premade or packaged marinades. Premade or packaged taco seasonings. Relishes. Regular salad dressings. Where to find more information:  National Heart, Lung, and [place]: https://wilson-eaton.com/  American Heart Association: www.heart.org Summary  The DASH eating plan is a healthy eating plan that has been shown to reduce high blood pressure (hypertension). It may also reduce your risk for type 2 diabetes, heart disease, and stroke.  With the DASH eating plan, you should limit salt (sodium) intake to 2,300 mg a day. If you have hypertension, you may need to reduce your sodium intake to 1,500 mg a day.  When on the DASH eating plan, aim to eat more fresh fruits and vegetables, whole grains, lean proteins, low-fat dairy, and heart-healthy fats.  Work with your health care provider or diet and nutrition specialist (dietitian)  to adjust your eating plan to your individual calorie needs. This information is not intended to replace advice given to you by your health care provider. Make sure you discuss any questions you have with your health care provider. Document Released: 03/04/2011 Document Revised: 03/08/2016 Document Reviewed: 03/08/2016 Elsevier Interactive Patient Education  2018 Elsevier Inc.  

## 2018-01-09 NOTE — Assessment & Plan Note (Signed)
Encouraged heart healthy diet, increase exercise, avoid trans fats, consider a krill oil cap daily 

## 2018-01-10 LAB — HEPATITIS C ANTIBODY
HEP C AB: NONREACTIVE
SIGNAL TO CUT-OFF: 0.01 (ref <1.00)

## 2018-01-31 ENCOUNTER — Other Ambulatory Visit: Payer: Self-pay | Admitting: Family Medicine

## 2018-02-04 ENCOUNTER — Other Ambulatory Visit: Payer: Self-pay | Admitting: Family Medicine

## 2018-02-06 MED ORDER — METHOCARBAMOL 500 MG PO TABS: 500.0000 mg | ORAL_TABLET | Freq: Four times a day (QID) | ORAL | 0 refills | Status: DC | PRN

## 2018-02-27 ENCOUNTER — Encounter: Payer: BLUE CROSS/BLUE SHIELD | Admitting: Family Medicine

## 2018-03-01 ENCOUNTER — Other Ambulatory Visit: Payer: Self-pay | Admitting: Family Medicine

## 2018-03-01 DIAGNOSIS — Z1231 Encounter for screening mammogram for malignant neoplasm of breast: Secondary | ICD-10-CM

## 2018-03-25 ENCOUNTER — Ambulatory Visit (HOSPITAL_BASED_OUTPATIENT_CLINIC_OR_DEPARTMENT_OTHER)
Admission: RE | Admit: 2018-03-25 | Discharge: 2018-03-25 | Disposition: A | Discharge status: Home / Self Care | Payer: BLUE CROSS/BLUE SHIELD | Source: Ambulatory Visit | Attending: Family Medicine | Admitting: Family Medicine

## 2018-03-25 ENCOUNTER — Encounter (HOSPITAL_BASED_OUTPATIENT_CLINIC_OR_DEPARTMENT_OTHER): Payer: Self-pay

## 2018-03-25 DIAGNOSIS — Z1231 Encounter for screening mammogram for malignant neoplasm of breast: Secondary | ICD-10-CM | POA: Insufficient documentation

## 2018-03-27 ENCOUNTER — Other Ambulatory Visit: Payer: Self-pay | Admitting: Family Medicine

## 2018-04-11 ENCOUNTER — Ambulatory Visit (INDEPENDENT_AMBULATORY_CARE_PROVIDER_SITE_OTHER): Payer: BLUE CROSS/BLUE SHIELD | Admitting: Family Medicine

## 2018-04-11 ENCOUNTER — Encounter: Payer: Self-pay | Admitting: Family Medicine

## 2018-04-11 VITALS — BP 120/70 | HR 80 | Temp 98.1°F | Resp 18 | Wt 233.0 lb

## 2018-04-11 DIAGNOSIS — J309 Allergic rhinitis, unspecified: Secondary | ICD-10-CM | POA: Diagnosis not present

## 2018-04-11 DIAGNOSIS — E559 Vitamin D deficiency, unspecified: Secondary | ICD-10-CM | POA: Diagnosis not present

## 2018-04-11 DIAGNOSIS — Z0000 Encounter for general adult medical examination without abnormal findings: Secondary | ICD-10-CM | POA: Diagnosis not present

## 2018-04-11 DIAGNOSIS — I10 Essential (primary) hypertension: Secondary | ICD-10-CM | POA: Diagnosis not present

## 2018-04-11 DIAGNOSIS — E785 Hyperlipidemia, unspecified: Secondary | ICD-10-CM

## 2018-04-11 DIAGNOSIS — R739 Hyperglycemia, unspecified: Secondary | ICD-10-CM

## 2018-04-11 DIAGNOSIS — E669 Obesity, unspecified: Secondary | ICD-10-CM

## 2018-04-11 MED ORDER — ESTRADIOL 2 MG PO TABS: 2.0000 mg | ORAL_TABLET | Freq: Every day | ORAL | 1 refills | Status: DC

## 2018-04-11 MED ORDER — TRIAMTERENE-HCTZ 37.5-25 MG PO TABS: 1.0000 | ORAL_TABLET | Freq: Every day | ORAL | 1 refills | Status: DC

## 2018-04-11 MED ORDER — METHOCARBAMOL 500 MG PO TABS: 500.0000 mg | ORAL_TABLET | Freq: Four times a day (QID) | ORAL | 1 refills | Status: DC | PRN

## 2018-04-11 NOTE — Assessment & Plan Note (Signed)
Supplement and monitor 

## 2018-04-11 NOTE — Assessment & Plan Note (Signed)
Using Zyrtec can increase to bid, can use Flonase daily, add Singulair, consider nasal saline and consider Benadryl QHS

## 2018-04-11 NOTE — Assessment & Plan Note (Signed)
Patient encouraged to maintain heart healthy diet, regular exercise, adequate sleep. Consider daily probiotics. Take medications as prescribed 

## 2018-04-11 NOTE — Patient Instructions (Addendum)
Can increase the Zyrtec to twice daily, restart Flonase, add nasal saline, Benadryl at bedtime. Add Singulair  No hair dye or relaxers  [employer], environmental working group Preventive Care 40-64 Years, Female Preventive care refers to lifestyle choices and visits with your health care provider that can promote health and wellness. What does preventive care include?   A yearly physical exam. This is also called an annual well check.  Dental exams once or twice a year.  Routine eye exams. Ask your health care provider how often you should have your eyes checked.  Personal lifestyle choices, including: ? Daily care of your teeth and gums. ? Regular physical activity. ? Eating a healthy diet. ? Avoiding tobacco and drug use. ? Limiting alcohol use. ? Practicing safe sex. ? Taking low-dose aspirin daily starting at age 51. ? Taking vitamin and mineral supplements as recommended by your health care provider. What happens during an annual well check? The services and screenings done by your health care provider during your annual well check will depend on your age, overall health, lifestyle risk factors, and family history of disease. Counseling Your health care provider may ask you questions about your:  Alcohol use.  Tobacco use.  Drug use.  Emotional well-being.  Home and relationship well-being.  Sexual activity.  Eating habits.  Work and work Statistician.  Method of birth control.  Menstrual cycle.  Pregnancy history. Screening You may have the following tests or measurements:  Height, weight, and BMI.  Blood pressure.  Lipid and cholesterol levels. These may be checked every 5 years, or more frequently if you are over 49 years old.  Skin check.  Lung cancer screening. You may have this screening every year starting at age 87 if you have a 30-pack-year history of smoking and currently smoke or have quit within the past 15 years.  Colorectal cancer screening.  All adults should have this screening starting at age 17 and continuing until age 82. Your health care provider may recommend screening at age 79. You will have tests every 1-10 years, depending on your results and the type of screening test. People at increased risk should start screening at an earlier age. Screening tests may include: ? Guaiac-based fecal occult blood testing. ? Fecal immunochemical test (FIT). ? Stool DNA test. ? Virtual colonoscopy. ? Sigmoidoscopy. During this test, a flexible tube with a tiny camera (sigmoidoscope) is used to examine your rectum and lower colon. The sigmoidoscope is inserted through your anus into your rectum and lower colon. ? Colonoscopy. During this test, a long, thin, flexible tube with a tiny camera (colonoscope) is used to examine your entire colon and rectum.  Hepatitis C blood test.  Hepatitis B blood test.  Sexually transmitted disease (STD) testing.  Diabetes screening. This is done by checking your blood sugar (glucose) after you have not eaten for a while (fasting). You may have this done every 1-3 years.  Mammogram. This may be done every 1-2 years. Talk to your health care provider about when you should start having regular mammograms. This may depend on whether you have a family history of breast cancer.  BRCA-related cancer screening. This may be done if you have a family history of breast, ovarian, tubal, or peritoneal cancers.  Pelvic exam and Pap test. This may be done every 3 years starting at age 2. Starting at age 97, this may be done every 5 years if you have a Pap test in combination with an HPV test.  Bone  density scan. This is done to screen for osteoporosis. You may have this scan if you are at high risk for osteoporosis. Discuss your test results, treatment options, and if necessary, the need for more tests with your health care provider. Vaccines Your health care provider may recommend certain vaccines, such  as:  Influenza vaccine. This is recommended every year.  Tetanus, diphtheria, and acellular pertussis (Tdap, Td) vaccine. You may need a Td booster every 10 years.  Varicella vaccine. You may need this if you have not been vaccinated.  Zoster vaccine. You may need this after age 76.  Measles, mumps, and rubella (MMR) vaccine. You may need at least one dose of MMR if you were born in 1957 or later. You may also need a second dose.  Pneumococcal 13-valent conjugate (PCV13) vaccine. You may need this if you have certain conditions and were not previously vaccinated.  Pneumococcal polysaccharide (PPSV23) vaccine. You may need one or two doses if you smoke cigarettes or if you have certain conditions.  Meningococcal vaccine. You may need this if you have certain conditions.  Hepatitis A vaccine. You may need this if you have certain conditions or if you travel or work in places where you may be exposed to hepatitis A.  Hepatitis B vaccine. You may need this if you have certain conditions or if you travel or work in places where you may be exposed to hepatitis B.  Haemophilus influenzae type b (Hib) vaccine. You may need this if you have certain conditions. Talk to your health care provider about which screenings and vaccines you need and how often you need them. This information is not intended to replace advice given to you by your health care provider. Make sure you discuss any questions you have with your health care provider. Document Released: 04/11/2015 Document Revised: 05/05/2017 Document Reviewed: 01/14/2015 Elsevier Interactive Patient Education  2019 [employer].

## 2018-04-12 LAB — LIPID PANEL
Cholesterol: 252 mg/dL — ABNORMAL HIGH (ref 0–200)
HDL: 66.4 mg/dL (ref >39.00)
LDL Cholesterol: 151 mg/dL — ABNORMAL HIGH (ref 0–99)
NonHDL: 185.57
Total CHOL/HDL Ratio: 4
Triglycerides: 175 mg/dL — ABNORMAL HIGH (ref 0.0–149.0)
VLDL: 35 mg/dL (ref 0.0–40.0)

## 2018-04-12 LAB — COMPREHENSIVE METABOLIC PANEL
ALT: 15 U/L (ref 0–35)
AST: 16 U/L (ref 0–37)
Albumin: 4.2 g/dL (ref 3.5–5.2)
Alkaline Phosphatase: 50 U/L (ref 39–117)
BUN: 15 mg/dL (ref 6–23)
CO2: 26 mEq/L (ref 19–32)
Calcium: 10.6 mg/dL — ABNORMAL HIGH (ref 8.4–10.5)
Chloride: 102 mEq/L (ref 96–112)
Creatinine, Ser: 0.67 mg/dL (ref 0.40–1.20)
GFR: 116.35 mL/min (ref >60.00)
Glucose, Bld: 82 mg/dL (ref 70–99)
Potassium: 4.2 mEq/L (ref 3.5–5.1)
SODIUM: 137 meq/L (ref 135–145)
Total Bilirubin: 0.5 mg/dL (ref 0.2–1.2)
Total Protein: 7.2 g/dL (ref 6.0–8.3)

## 2018-04-12 LAB — CBC
HCT: 41.1 % (ref 36.0–46.0)
Hemoglobin: 13.6 g/dL (ref 12.0–15.0)
MCHC: 33.1 g/dL (ref 30.0–36.0)
MCV: 89.9 fl (ref 78.0–100.0)
Platelets: 265 10*3/uL (ref 150.0–400.0)
RBC: 4.57 Mil/uL (ref 3.87–5.11)
RDW: 15.4 % (ref 11.5–15.5)
WBC: 7.1 10*3/uL (ref 4.0–10.5)

## 2018-04-12 LAB — TSH: TSH: 1.38 u[IU]/mL (ref 0.35–4.50)

## 2018-04-12 LAB — VITAMIN D 25 HYDROXY (VIT D DEFICIENCY, FRACTURES): VITD: 43.75 ng/mL (ref 30.00–100.00)

## 2018-04-12 LAB — HEMOGLOBIN A1C: Hgb A1c MFr Bld: 5.4 % (ref 4.6–6.5)

## 2018-04-13 MED ORDER — ATORVASTATIN CALCIUM 10 MG PO TABS: 10.0000 mg | ORAL_TABLET | Freq: Every day | ORAL | 3 refills | Status: DC

## 2018-04-13 MED ORDER — MONTELUKAST SODIUM 10 MG PO TABS: 10.0000 mg | ORAL_TABLET | Freq: Every day | ORAL | 3 refills | Status: DC | PRN

## 2018-04-17 NOTE — Assessment & Plan Note (Signed)
Encouraged heart healthy diet, increase exercise, avoid trans fats, consider a krill oil cap daily 

## 2018-04-17 NOTE — Assessment & Plan Note (Signed)
Encouraged DASH diet, decrease po intake and increase exercise as tolerated. Needs 7-8 hours of sleep nightly. Avoid trans fats, eat small, frequent meals every 4-5 hours with lean proteins, complex carbs and healthy fats. Minimize simple carbs 

## 2018-04-17 NOTE — Assessment & Plan Note (Signed)
Well controlled, no changes to meds. Encouraged heart healthy diet such as the DASH diet and exercise as tolerated.  °

## 2018-04-17 NOTE — Progress Notes (Signed)
Subjective:    Patient ID: Sherri Sullivan, female    DOB: 09/28/1960, 58 y.o.   MRN: [id number]  No chief complaint on file.   HPI Patient is in today for annual preventative exam and follow-up on chronic medical concerns including hypertension, hyperlipidemia, hyperglycemia and vitamin D deficiency.  She feels well today.  No recent febrile illness or hospitalizations.  She does note an increase in allergic symptoms.  She has been struggling with itchy watery eyes scratchy throat nonproductive cough and congestion.  Antihistamines have only been partially helpful.  She has been under a great deal of stress as her maternal cousin age 31 has just died of breast cancer.  [facility].  Offers no GYN complaints.  Is doing well with activities of daily living.  Tries to stay active and maintain a heart healthy diet. Denies CP/palp/SOB/HA/congestion/fevers/GI or GU c/o. Taking meds as prescribed  Past Medical History:  Diagnosis Date  . Anginal pain ([place])   . 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 Back pain   . Chicken pox as a child  . Constipation   . Dizziness   . Dysrhythmia 2013   SVT- treated by ablation by Dr. Lovena Le to f/u with as needed basis   . Encounter for preventative adult health care exam with abnormal findings 02/09/2015  . HTN (hypertension)   . Hypokalemia 02/09/2015  . Joint pain   . Leg edema   . Measles as a child  . Measles as a child  . Obesity   . Panic attack    during episode of feeling to crowded   . Preventative health care 02/09/2015  . Seasonal allergies   . SOB (shortness of breath) 09/23/11   "a little bit; at rest; before ablation", SOB again now (08/2014- due to lack  of exercise)   . SVT (supraventricular tachycardia) ([place])   . Uterine fibroid 12/17/2012   Cervical polyp per patient Follows with [facility], Dr Evette Cristal     Past Surgical History:  Procedure Laterality Date  . ABDOMINAL HYSTERECTOMY  1990's  . CARDIAC ELECTROPHYSIOLOGY STUDY AND ABLATION  09/23/11  . MAXIMUM ACCESS (MAS)POSTERIOR LUMBAR INTERBODY FUSION (PLIF) 1 LEVEL N/A 09/24/2014   Procedure: L5-S1 MAS PLIF ;  Surgeon: Erline Levine, MD;  Location: [facility];  Service: Neurosurgery;  Laterality: N/A;  L5-S1 MAS PLIF fusion  . SUPRAVENTRICULAR TACHYCARDIA ABLATION N/A 09/23/2011   Procedure: SUPRAVENTRICULAR TACHYCARDIA ABLATION;  Surgeon: Evans Lance, MD;  Location: [facility] CATH LAB;  Service: Cardiovascular;  Laterality: N/A;  . TUBAL LIGATION  1980's    Family History  Problem Relation Age of Onset  . Cancer Sister        lung  . Diabetes Mother        type 2  . Heart disease Mother   . Hypertension Mother   . Stroke Mother   . Obesity Mother   . Heart disease Father   . Obesity Father   . Hypertension Daughter   . Stroke Maternal Grandmother   . Diabetes Maternal Aunt   . Kidney disease Maternal Aunt   . Diabetes Maternal Uncle   . Breast cancer Cousin   . Colon cancer Neg Hx   . Esophageal cancer Neg Hx   . Rectal cancer Neg Hx   . Stomach cancer Neg Hx  Social History   Socioeconomic History  . Marital status: Married    Spouse name: Marcello Moores  . Number of children: 2  . Years of education: Not on file  . Highest education level: Not on file  Occupational History  . Occupation: Retired  Scientific laboratory technician  . Financial resource strain: Not on file  . Food insecurity:    Worry: Not on file    Inability: Not on file  . Transportation needs:    Medical: Not on file    Non-medical: Not on file  Tobacco Use  . Smoking status: Former Smoker    Packs/day: 0.12    Years: 8.00    Pack years: 0.96    Types: Cigarettes    Last attempt to quit: 03/29/1984    Years since quitting: 34.0  . Smokeless tobacco: Never Used  Substance and Sexual Activity  . Alcohol use: Yes    Alcohol/week: 14.0 standard drinks    Types:  14 Glasses of wine per week    Comment: Drinks 2-3 glasses wine on the weekends  . Drug use: No  . Sexual activity: Not Currently  Lifestyle  . Physical activity:    Days per week: Not on file    Minutes per session: Not on file  . Stress: Not on file  Relationships  . Social connections:    Talks on phone: Not on file    Gets together: Not on file    Attends religious service: Not on file    Active member of club or organization: Not on file    Attends meetings of clubs or organizations: Not on file    Relationship status: Not on file  . Intimate partner violence:    Fear of current or ex partner: Not on file    Emotionally abused: Not on file    Physically abused: Not on file    Forced sexual activity: Not on file  Other Topics Concern  . Not on file  Social History Narrative   No major restrictions   Lives with husband, daughter, grand daughter in a 2 story home.    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    Outpatient Medications Prior to Visit  Medication Sig Dispense Refill  . cetirizine (ZYRTEC) 10 MG tablet Take 10 mg by mouth daily.    . diclofenac (VOLTAREN) 75 MG EC tablet Take 1 tablet (75 mg total) by mouth 2 (two) times daily. 180 tablet 1  . fluticasone (FLONASE) 50 MCG/ACT nasal spray USE 2 SPRAYS IN EACH  NOSTRIL DAILY 32 g 1  . Magnesium Citrate 100 MG TABS Take 2-3 tablets by mouth daily as needed.    . OMEGA-3 KRILL OIL PO Take 353 mg by mouth daily. MegaRed Joint Care    . potassium chloride SA (K-DUR,KLOR-CON) 20 MEQ tablet TAKE 1 TABLET BY MOUTH TWO  TIMES DAILY 180 tablet 1  . Probiotic Product (PROBIOTIC PO) Take 2 tablets by mouth at bedtime.    . Wheat Dextrin (BENEFIBER) POWD Take 2-3 scoop by mouth daily as needed.    Marland Kitchen estradiol (ESTRACE) 2 MG tablet TAKE 1 TABLET BY MOUTH  DAILY 90 tablet 1  . methocarbamol (ROBAXIN) 500 MG tablet Take 1 tablet (500 mg total) by mouth every 6 (six) hours as needed for muscle  spasms. 90 tablet 0  . triamterene-hydrochlorothiazide (MAXZIDE-25) 37.5-25 MG tablet Take 1 tablet by mouth daily. 90 tablet 1  . Vitamin  D, Ergocalciferol, (DRISDOL) 50000 units CAPS capsule Take 1 capsule (50,000 Units total) by mouth every 7 (seven) days. 12 capsule 0   No facility-administered medications prior to visit.     Allergies  Allergen Reactions  . Doxycycline Diarrhea and Nausea And Vomiting    Review of Systems  Constitutional: Positive for malaise/fatigue. Negative for fever.  HENT: Positive for congestion.   Eyes: Positive for discharge. Negative for blurred vision, pain and redness.  Respiratory: Positive for cough. Negative for shortness of breath.   Cardiovascular: Negative for chest pain, palpitations and leg swelling.  Gastrointestinal: Negative for abdominal pain, blood in stool and nausea.  Genitourinary: Negative for dysuria and frequency.  Musculoskeletal: Negative for falls.  Skin: Negative for rash.  Neurological: Negative for dizziness, loss of consciousness and headaches.  Endo/Heme/Allergies: Negative for environmental allergies.  Psychiatric/Behavioral: Negative for depression. The patient is not nervous/anxious.        Objective:    Physical Exam Constitutional:      General: She is not in acute distress.    Appearance: She is not diaphoretic.  HENT:     Head: Normocephalic and atraumatic.     Comments: Nasal mucosa boggy and erythematous    Right Ear: External ear normal.     Left Ear: External ear normal.     Nose: Nose normal.     Mouth/Throat:     Pharynx: No oropharyngeal exudate.  Eyes:     General: No scleral icterus.       Right eye: No discharge.        Left eye: No discharge.     Conjunctiva/sclera: Conjunctivae normal.     Pupils: Pupils are equal, round, and reactive to light.  Neck:     Musculoskeletal: Normal range of motion and neck supple.     Thyroid: No thyromegaly.  Cardiovascular:     Rate and Rhythm: Normal  rate and regular rhythm.     Heart sounds: Normal heart sounds. No murmur.  Pulmonary:     Effort: Pulmonary effort is normal. No respiratory distress.     Breath sounds: Normal breath sounds. No wheezing or rales.  Abdominal:     General: Bowel sounds are normal. There is no distension.     Palpations: Abdomen is soft. There is no mass.     Tenderness: There is no abdominal tenderness.  Musculoskeletal: Normal range of motion.        General: No tenderness.  Lymphadenopathy:     Cervical: No cervical adenopathy.  Skin:    General: Skin is warm and dry.     Findings: No rash.  Neurological:     Mental Status: She is alert and oriented to person, place, and time.     Cranial Nerves: No cranial nerve deficit.     Coordination: Coordination normal.     Deep Tendon Reflexes: Reflexes are normal and symmetric. Reflexes normal.     BP 120/70 (BP Location: Left Arm, Patient Position: Sitting, Cuff Size: Normal)   Pulse 80   Temp 98.1 F (36.7 C) (Oral)   Resp 18   Wt 233 lb (105.7 kg)   SpO2 96%   BMI 41.27 kg/m  Wt Readings from Last 3 Encounters:  04/11/18 233 lb (105.7 kg)  01/09/18 231 lb 1.6 oz (104.8 kg)  10/19/17 226 lb (102.5 kg)     Lab Results  Component Value Date   WBC 7.1 04/11/2018   HGB 13.6 04/11/2018   HCT 41.1 04/11/2018  PLT 265.0 04/11/2018   GLUCOSE 82 04/11/2018   CHOL 252 (H) 04/11/2018   TRIG 175.0 (H) 04/11/2018   HDL 66.40 04/11/2018   LDLDIRECT 143.0 08/29/2017   LDLCALC 151 (H) 04/11/2018   ALT 15 04/11/2018   AST 16 04/11/2018   NA 137 04/11/2018   K 4.2 04/11/2018   CL 102 04/11/2018   CREATININE 0.67 04/11/2018   BUN 15 04/11/2018   CO2 26 04/11/2018   TSH 1.38 04/11/2018   HGBA1C 5.4 04/11/2018    Lab Results  Component Value Date   TSH 1.38 04/11/2018   Lab Results  Component Value Date   WBC 7.1 04/11/2018   HGB 13.6 04/11/2018   HCT 41.1 04/11/2018   MCV 89.9 04/11/2018   PLT 265.0 04/11/2018   Lab Results    Component Value Date   NA 137 04/11/2018   K 4.2 04/11/2018   CO2 26 04/11/2018   GLUCOSE 82 04/11/2018   BUN 15 04/11/2018   CREATININE 0.67 04/11/2018   BILITOT 0.5 04/11/2018   ALKPHOS 50 04/11/2018   AST 16 04/11/2018   ALT 15 04/11/2018   PROT 7.2 04/11/2018   ALBUMIN 4.2 04/11/2018   CALCIUM 10.6 (H) 04/11/2018   ANIONGAP 9 09/13/2014   GFR 116.35 04/11/2018   Lab Results  Component Value Date   CHOL 252 (H) 04/11/2018   Lab Results  Component Value Date   HDL 66.40 04/11/2018   Lab Results  Component Value Date   LDLCALC 151 (H) 04/11/2018   Lab Results  Component Value Date   TRIG 175.0 (H) 04/11/2018   Lab Results  Component Value Date   CHOLHDL 4 04/11/2018   Lab Results  Component Value Date   HGBA1C 5.4 04/11/2018       Assessment & Plan:   Problem List Items Addressed This Visit    Hyperlipidemia - Primary    Encouraged heart healthy diet, increase exercise, avoid trans fats, consider a krill oil cap daily      Relevant Medications   triamterene-hydrochlorothiazide (MAXZIDE-25) 37.5-25 MG tablet   atorvastatin (LIPITOR) 10 MG tablet   Other Relevant Orders   Lipid panel (Completed)   Lipid panel   Allergic rhinitis    Using Zyrtec can increase to bid, can use Flonase daily, add Singulair, consider nasal saline and consider Benadryl QHS      Obesity    Encouraged DASH diet, decrease po intake and increase exercise as tolerated. Needs 7-8 hours of sleep nightly. Avoid trans fats, eat small, frequent meals every 4-5 hours with lean proteins, complex carbs and healthy fats. Minimize simple carbs      Essential hypertension, benign    Well controlled, no changes to meds. Encouraged heart healthy diet such as the DASH diet and exercise as tolerated.       Relevant Medications   triamterene-hydrochlorothiazide (MAXZIDE-25) 37.5-25 MG tablet   atorvastatin (LIPITOR) 10 MG tablet   Other Relevant Orders   CBC (Completed)   Comprehensive  metabolic panel (Completed)   TSH (Completed)   CBC   Comprehensive metabolic panel   TSH   Preventative health care    Patient encouraged to maintain heart healthy diet, regular exercise, adequate sleep. Consider daily probiotics. Take medications as prescribed      Vitamin D deficiency    Supplement and monitor      Relevant Orders   VITAMIN D 25 Hydroxy (Vit-D Deficiency, Fractures) (Completed)   VITAMIN D 25 Hydroxy (Vit-D Deficiency, Fractures)  Hyperglycemia    hgba1c acceptable, minimize simple carbs. Increase exercise as tolerated.       Relevant Orders   Hemoglobin A1c (Completed)   Hemoglobin A1c      I have discontinued Domingo Sep. Weintraub's Vitamin D (Ergocalciferol). I have also changed her estradiol. Additionally, I am having her start on atorvastatin and montelukast. Lastly, I am having her maintain her OMEGA-3 KRILL OIL PO, cetirizine, Probiotic Product (PROBIOTIC PO), BENEFIBER, Magnesium Citrate, diclofenac, potassium chloride SA, fluticasone, methocarbamol, and triamterene-hydrochlorothiazide.  Meds ordered this encounter  Medications  . methocarbamol (ROBAXIN) 500 MG tablet    Sig: Take 1 tablet (500 mg total) by mouth every 6 (six) hours as needed for muscle spasms.    Dispense:  90 tablet    Refill:  1  . triamterene-hydrochlorothiazide (MAXZIDE-25) 37.5-25 MG tablet    Sig: Take 1 tablet by mouth daily.    Dispense:  90 tablet    Refill:  1  . estradiol (ESTRACE) 2 MG tablet    Sig: Take 1 tablet (2 mg total) by mouth daily.    Dispense:  90 tablet    Refill:  1  . atorvastatin (LIPITOR) 10 MG tablet    Sig: Take 1 tablet (10 mg total) by mouth daily.    Dispense:  90 tablet    Refill:  3  . montelukast (SINGULAIR) 10 MG tablet    Sig: Take 1 tablet (10 mg total) by mouth daily as needed.    Dispense:  90 tablet    Refill:  3     Penni Homans, MD

## 2018-04-17 NOTE — Assessment & Plan Note (Signed)
hgba1c acceptable, minimize simple carbs. Increase exercise as tolerated.  

## 2018-04-20 ENCOUNTER — Telehealth: Payer: Self-pay | Admitting: *Deleted

## 2018-04-20 NOTE — Telephone Encounter (Signed)
Received Medical records from [facility]-Dr. Nori Riis; forwarded to provider/SLS 01/23

## 2018-05-19 ENCOUNTER — Other Ambulatory Visit: Payer: Self-pay | Admitting: Family Medicine

## 2018-05-31 DIAGNOSIS — Z01419 Encounter for gynecological examination (general) (routine) without abnormal findings: Secondary | ICD-10-CM | POA: Diagnosis not present

## 2018-05-31 DIAGNOSIS — Z6837 Body mass index (BMI) 37.0-37.9, adult: Secondary | ICD-10-CM | POA: Diagnosis not present

## 2018-05-31 LAB — HM PAP SMEAR: HM Pap smear: NORMAL

## 2018-06-06 DIAGNOSIS — H40013 Open angle with borderline findings, low risk, bilateral: Secondary | ICD-10-CM | POA: Diagnosis not present

## 2018-06-06 DIAGNOSIS — H2513 Age-related nuclear cataract, bilateral: Secondary | ICD-10-CM | POA: Diagnosis not present

## 2018-07-19 ENCOUNTER — Other Ambulatory Visit: Payer: Self-pay | Admitting: Family Medicine

## 2018-08-13 ENCOUNTER — Other Ambulatory Visit: Payer: Self-pay | Admitting: Family Medicine

## 2018-10-10 ENCOUNTER — Other Ambulatory Visit (INDEPENDENT_AMBULATORY_CARE_PROVIDER_SITE_OTHER): Payer: BC Managed Care – PPO

## 2018-10-10 ENCOUNTER — Ambulatory Visit: Payer: BLUE CROSS/BLUE SHIELD | Admitting: Family Medicine

## 2018-10-10 ENCOUNTER — Other Ambulatory Visit: Payer: Self-pay

## 2018-10-10 DIAGNOSIS — I10 Essential (primary) hypertension: Secondary | ICD-10-CM

## 2018-10-10 DIAGNOSIS — R739 Hyperglycemia, unspecified: Secondary | ICD-10-CM

## 2018-10-10 DIAGNOSIS — E559 Vitamin D deficiency, unspecified: Secondary | ICD-10-CM

## 2018-10-10 DIAGNOSIS — E785 Hyperlipidemia, unspecified: Secondary | ICD-10-CM

## 2018-10-11 LAB — LIPID PANEL
Cholesterol: 169 mg/dL (ref 0–200)
HDL: 54 mg/dL (ref >39.00)
LDL Cholesterol: 83 mg/dL (ref 0–99)
NonHDL: 114.74
Total CHOL/HDL Ratio: 3
Triglycerides: 158 mg/dL — ABNORMAL HIGH (ref 0.0–149.0)
VLDL: 31.6 mg/dL (ref 0.0–40.0)

## 2018-10-11 LAB — COMPREHENSIVE METABOLIC PANEL
ALT: 13 U/L (ref 0–35)
AST: 15 U/L (ref 0–37)
Albumin: 4.3 g/dL (ref 3.5–5.2)
Alkaline Phosphatase: 46 U/L (ref 39–117)
BUN: 14 mg/dL (ref 6–23)
CO2: 25 mEq/L (ref 19–32)
Calcium: 10 mg/dL (ref 8.4–10.5)
Chloride: 104 mEq/L (ref 96–112)
Creatinine, Ser: 0.67 mg/dL (ref 0.40–1.20)
GFR: 109.28 mL/min (ref >60.00)
Glucose, Bld: 86 mg/dL (ref 70–99)
Potassium: 4.1 mEq/L (ref 3.5–5.1)
Sodium: 138 mEq/L (ref 135–145)
Total Bilirubin: 0.8 mg/dL (ref 0.2–1.2)
Total Protein: 7 g/dL (ref 6.0–8.3)

## 2018-10-11 LAB — CBC
HCT: 39 % (ref 36.0–46.0)
Hemoglobin: 12.7 g/dL (ref 12.0–15.0)
MCHC: 32.6 g/dL (ref 30.0–36.0)
MCV: 90.4 fl (ref 78.0–100.0)
Platelets: 221 10*3/uL (ref 150.0–400.0)
RBC: 4.31 Mil/uL (ref 3.87–5.11)
RDW: 15.3 % (ref 11.5–15.5)
WBC: 5 10*3/uL (ref 4.0–10.5)

## 2018-10-11 LAB — VITAMIN D 25 HYDROXY (VIT D DEFICIENCY, FRACTURES): VITD: 58.8 ng/mL (ref 30.00–100.00)

## 2018-10-11 LAB — TSH: TSH: 1.04 u[IU]/mL (ref 0.35–4.50)

## 2018-10-11 LAB — HEMOGLOBIN A1C: Hgb A1c MFr Bld: 5.3 % (ref 4.6–6.5)

## 2018-10-13 ENCOUNTER — Other Ambulatory Visit: Payer: Self-pay

## 2018-10-13 ENCOUNTER — Ambulatory Visit (INDEPENDENT_AMBULATORY_CARE_PROVIDER_SITE_OTHER): Payer: BC Managed Care – PPO | Admitting: Family Medicine

## 2018-10-13 DIAGNOSIS — E785 Hyperlipidemia, unspecified: Secondary | ICD-10-CM

## 2018-10-13 DIAGNOSIS — J309 Allergic rhinitis, unspecified: Secondary | ICD-10-CM

## 2018-10-13 DIAGNOSIS — E559 Vitamin D deficiency, unspecified: Secondary | ICD-10-CM

## 2018-10-13 DIAGNOSIS — I10 Essential (primary) hypertension: Secondary | ICD-10-CM

## 2018-10-13 DIAGNOSIS — R739 Hyperglycemia, unspecified: Secondary | ICD-10-CM

## 2018-10-13 DIAGNOSIS — E669 Obesity, unspecified: Secondary | ICD-10-CM

## 2018-10-15 NOTE — Assessment & Plan Note (Signed)
hgba1c acceptable, minimize simple carbs. Increase exercise as tolerated.  

## 2018-10-15 NOTE — Assessment & Plan Note (Signed)
Has lost 12 # since last visit. Has been working hard at it.

## 2018-10-15 NOTE — Assessment & Plan Note (Signed)
No recent flare, continue meds

## 2018-10-15 NOTE — Assessment & Plan Note (Signed)
Encouraged heart healthy diet, increase exercise, avoid trans fats, consider a krill oil cap daily 

## 2018-10-15 NOTE — Progress Notes (Signed)
Virtual Visit via Video Note  I connected with Sherri Sullivan on 10/15/18 at  9:00 AM EDT by a video enabled telemedicine application and verified that I am speaking with the correct person using two identifiers.  Location: Patient: home Provider: home   I discussed the limitations of evaluation and management by telemedicine and the availability of in person appointments. The patient expressed understanding and agreed to proceed. Magdalene Molly, CMA was able to get patient set up on a video visit    Subjective:    Patient ID: Sherri Sullivan, female    DOB: 09-Aug-1960, 58 y.o.   MRN: [id number]  No chief complaint on file.   HPI Patient is in today for follow-up on chronic medical concerns including hyperlipidemia, hyperglycemia and hypertension.  She feels well today.  No recent febrile illness or hospitalizations.  She has been under a great deal of stress as her uncle in [place] died at age 56 of complications of coronavirus and another family member has recently recovered from it.  Her uncle was in a nursing home up in [place] when he passed.  The patient herself has been trying to eat better and has lost 12 pounds since her last visit so she physically feels well. Denies CP/palp/SOB/HA/congestion/fevers/GI or GU c/o. Taking meds as prescribed  Past Medical History:  Diagnosis Date  . Anginal pain ([place])   . 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 Back pain   . Chicken pox as a child  . Constipation   . Dizziness   . Dysrhythmia 2013   SVT- treated by ablation by Dr. Lovena Le to f/u with as needed basis   . Encounter for preventative adult health care exam with abnormal findings 02/09/2015  . HTN (hypertension)   . Hypokalemia 02/09/2015  . Joint pain   . Leg edema   . Measles as a child  . Measles as a child  . Obesity   . Panic attack    during episode of feeling to crowded   . Preventative health care 02/09/2015  .  Seasonal allergies   . SOB (shortness of breath) 09/23/11   "a little bit; at rest; before ablation", SOB again now (08/2014- due to lack  of exercise)   . SVT (supraventricular tachycardia) ([place])   . Uterine fibroid 12/17/2012   Cervical polyp per patient Follows with [facility], Dr Evette Cristal    Past Surgical History:  Procedure Laterality Date  . ABDOMINAL HYSTERECTOMY  1990's  . CARDIAC ELECTROPHYSIOLOGY STUDY AND ABLATION  09/23/11  . MAXIMUM ACCESS (MAS)POSTERIOR LUMBAR INTERBODY FUSION (PLIF) 1 LEVEL N/A 09/24/2014   Procedure: L5-S1 MAS PLIF ;  Surgeon: Erline Levine, MD;  Location: [facility];  Service: Neurosurgery;  Laterality: N/A;  L5-S1 MAS PLIF fusion  . SUPRAVENTRICULAR TACHYCARDIA ABLATION N/A 09/23/2011   Procedure: SUPRAVENTRICULAR TACHYCARDIA ABLATION;  Surgeon: Evans Lance, MD;  Location: [facility] CATH LAB;  Service: Cardiovascular;  Laterality: N/A;  . TUBAL LIGATION  1980's    Family History  Problem Relation Age of Onset  . Cancer Sister        lung  . Diabetes Mother        type 2  . Heart disease Mother   . Hypertension Mother   . Stroke Mother   . Obesity Mother   . Heart disease Father   . Obesity Father   . Hypertension Daughter   .  Stroke Maternal Grandmother   . Diabetes Maternal Aunt   . Kidney disease Maternal Aunt   . Diabetes Maternal Uncle   . Breast cancer Cousin   . Colon cancer Neg Hx   . Esophageal cancer Neg Hx   . Rectal cancer Neg Hx   . Stomach cancer Neg Hx     Social History   Socioeconomic History  . Marital status: Married    Spouse name: Marcello Moores  . Number of children: 2  . Years of education: Not on file  . Highest education level: Not on file  Occupational History  . Occupation: Retired  Scientific laboratory technician  . Financial resource strain: Not on file  . Food insecurity    Worry: Not on file    Inability: Not on file  . Transportation needs    Medical: Not on file    Non-medical: Not on file  Tobacco Use  .  Smoking status: Former Smoker    Packs/day: 0.12    Years: 8.00    Pack years: 0.96    Types: Cigarettes    Quit date: 03/29/1984    Years since quitting: 34.5  . Smokeless tobacco: Never Used  Substance and Sexual Activity  . Alcohol use: Yes    Alcohol/week: 14.0 standard drinks    Types: 14 Glasses of wine per week    Comment: Drinks 2-3 glasses wine on the weekends  . Drug use: No  . Sexual activity: Not Currently  Lifestyle  . Physical activity    Days per week: Not on file    Minutes per session: Not on file  . Stress: Not on file  Relationships  . Social Herbalist on phone: Not on file    Gets together: Not on file    Attends religious service: Not on file    Active member of club or organization: Not on file    Attends meetings of clubs or organizations: Not on file    Relationship status: Not on file  . Intimate partner violence    Fear of current or ex partner: Not on file    Emotionally abused: Not on file    Physically abused: Not on file    Forced sexual activity: Not on file  Other Topics Concern  . Not on file  Social History Narrative   No major restrictions   Lives with husband, daughter, grand daughter in a 2 story home.    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    Outpatient Medications Prior to Visit  Medication Sig Dispense Refill  . atorvastatin (LIPITOR) 10 MG tablet Take 1 tablet (10 mg total) by mouth daily. 90 tablet 3  . cetirizine (ZYRTEC) 10 MG tablet Take 10 mg by mouth daily.    . diclofenac (VOLTAREN) 75 MG EC tablet TAKE 1 TABLET BY MOUTH TWO  TIMES DAILY 180 tablet 1  . estradiol (ESTRACE) 2 MG tablet TAKE 1 TABLET BY MOUTH  DAILY 90 tablet 0  . fluticasone (FLONASE) 50 MCG/ACT nasal spray USE 2 SPRAYS IN EACH  NOSTRIL DAILY 32 g 1  . Magnesium Citrate 100 MG TABS Take 2-3 tablets by mouth daily as needed.    . methocarbamol (ROBAXIN) 500 MG tablet Take 1 tablet (500 mg total) by mouth  every 6 (six) hours as needed for muscle spasms. 90 tablet 1  . montelukast (SINGULAIR) 10 MG tablet Take 1 tablet (10 mg total) by  mouth daily as needed. 90 tablet 3  . OMEGA-3 KRILL OIL PO Take 353 mg by mouth daily. [facility] [facility]    . potassium chloride SA (K-DUR) 20 MEQ tablet TAKE 1 TABLET BY MOUTH TWO  TIMES DAILY 180 tablet 1  . Probiotic Product (PROBIOTIC PO) Take 2 tablets by mouth at bedtime.    . triamterene-hydrochlorothiazide (MAXZIDE-25) 37.5-25 MG tablet Take 1 tablet by mouth daily. 90 tablet 1  . Wheat Dextrin (BENEFIBER) POWD Take 2-3 scoop by mouth daily as needed.     No facility-administered medications prior to visit.     Allergies  Allergen Reactions  . Doxycycline Diarrhea and Nausea And Vomiting    Review of Systems  Constitutional: Negative for fever and malaise/fatigue.  HENT: Negative for congestion.   Eyes: Negative for blurred vision.  Respiratory: Negative for shortness of breath.   Cardiovascular: Negative for chest pain, palpitations and leg swelling.  Gastrointestinal: Negative for abdominal pain, blood in stool and nausea.  Genitourinary: Negative for dysuria and frequency.  Musculoskeletal: Negative for falls.  Skin: Negative for rash.  Neurological: Negative for dizziness, loss of consciousness and headaches.  Endo/Heme/Allergies: Negative for environmental allergies.  Psychiatric/Behavioral: Negative for depression. The patient is not nervous/anxious.        Objective:    Physical Exam Constitutional:      Appearance: Normal appearance. She is not ill-appearing.  HENT:     Head: Normocephalic and atraumatic.     Nose: Nose normal.  Pulmonary:     Effort: Pulmonary effort is normal.  Neurological:     Mental Status: She is alert and oriented to person, place, and time.  Psychiatric:        Mood and Affect: Mood normal.        Behavior: Behavior normal.     There were no vitals taken for this visit. Wt Readings from Last 3  Encounters:  04/11/18 233 lb (105.7 kg)  01/09/18 231 lb 1.6 oz (104.8 kg)  10/19/17 226 lb (102.5 kg)    Diabetic Foot Exam - Simple   No data filed     Lab Results  Component Value Date   WBC 5.0 10/10/2018   HGB 12.7 10/10/2018   HCT 39.0 10/10/2018   PLT 221.0 10/10/2018   GLUCOSE 86 10/10/2018   CHOL 169 10/10/2018   TRIG 158.0 (H) 10/10/2018   HDL 54.00 10/10/2018   LDLDIRECT 143.0 08/29/2017   LDLCALC 83 10/10/2018   ALT 13 10/10/2018   AST 15 10/10/2018   NA 138 10/10/2018   K 4.1 10/10/2018   CL 104 10/10/2018   CREATININE 0.67 10/10/2018   BUN 14 10/10/2018   CO2 25 10/10/2018   TSH 1.04 10/10/2018   HGBA1C 5.3 10/10/2018    Lab Results  Component Value Date   TSH 1.04 10/10/2018   Lab Results  Component Value Date   WBC 5.0 10/10/2018   HGB 12.7 10/10/2018   HCT 39.0 10/10/2018   MCV 90.4 10/10/2018   PLT 221.0 10/10/2018   Lab Results  Component Value Date   NA 138 10/10/2018   K 4.1 10/10/2018   CO2 25 10/10/2018   GLUCOSE 86 10/10/2018   BUN 14 10/10/2018   CREATININE 0.67 10/10/2018   BILITOT 0.8 10/10/2018   ALKPHOS 46 10/10/2018   AST 15 10/10/2018   ALT 13 10/10/2018   PROT 7.0 10/10/2018   ALBUMIN 4.3 10/10/2018   CALCIUM 10.0 10/10/2018   ANIONGAP 9 09/13/2014   GFR  109.28 10/10/2018   Lab Results  Component Value Date   CHOL 169 10/10/2018   Lab Results  Component Value Date   HDL 54.00 10/10/2018   Lab Results  Component Value Date   LDLCALC 83 10/10/2018   Lab Results  Component Value Date   TRIG 158.0 (H) 10/10/2018   Lab Results  Component Value Date   CHOLHDL 3 10/10/2018   Lab Results  Component Value Date   HGBA1C 5.3 10/10/2018       Assessment & Plan:   Problem List Items Addressed This Visit    Hyperlipidemia    Encouraged heart healthy diet, increase exercise, avoid trans fats, consider a krill oil cap daily      Allergic rhinitis    No recent flare, continue meds      Obesity    Has  lost 12 # since last visit. Has been working hard at it.       Essential hypertension, benign    Check vital signs no changes to meds. Encouraged heart healthy diet such as the DASH diet and exercise as tolerated.       Vitamin D deficiency    Supplement and monitor      Hyperglycemia    hgba1c acceptable, minimize simple carbs. Increase exercise as tolerated.          I am having Domingo Sep. Leung maintain her OMEGA-3 KRILL OIL PO, cetirizine, Probiotic Product (PROBIOTIC PO), Benefiber, Magnesium Citrate, fluticasone, methocarbamol, triamterene-hydrochlorothiazide, atorvastatin, montelukast, diclofenac, potassium chloride SA, and estradiol.  No orders of the defined types were placed in this encounter. I discussed the assessment and treatment plan with the patient. The patient was provided an opportunity to ask questions and all were answered. The patient agreed with the plan and demonstrated an understanding of the instructions.   The patient was advised to call back or seek an in-person evaluation if the symptoms worsen or if the condition fails to improve as anticipated.  I provided 25 minutes of non-face-to-face time during this encounter.   Penni Homans, MD

## 2018-10-15 NOTE — Assessment & Plan Note (Signed)
Supplement and monitor 

## 2018-10-15 NOTE — Assessment & Plan Note (Signed)
Check vital signs no changes to meds. Encouraged heart healthy diet such as the DASH diet and exercise as tolerated.

## 2018-12-07 ENCOUNTER — Encounter: Payer: Self-pay | Admitting: Family Medicine

## 2018-12-07 ENCOUNTER — Ambulatory Visit (INDEPENDENT_AMBULATORY_CARE_PROVIDER_SITE_OTHER): Payer: BC Managed Care – PPO

## 2018-12-07 ENCOUNTER — Other Ambulatory Visit: Payer: Self-pay

## 2018-12-07 DIAGNOSIS — Z23 Encounter for immunization: Secondary | ICD-10-CM

## 2018-12-27 DIAGNOSIS — M5416 Radiculopathy, lumbar region: Secondary | ICD-10-CM | POA: Diagnosis not present

## 2018-12-27 DIAGNOSIS — Z6838 Body mass index (BMI) 38.0-38.9, adult: Secondary | ICD-10-CM | POA: Diagnosis not present

## 2018-12-27 DIAGNOSIS — M4317 Spondylolisthesis, lumbosacral region: Secondary | ICD-10-CM | POA: Diagnosis not present

## 2018-12-27 DIAGNOSIS — M545 Low back pain: Secondary | ICD-10-CM | POA: Diagnosis not present

## 2019-01-14 ENCOUNTER — Other Ambulatory Visit: Payer: Self-pay | Admitting: Family Medicine

## 2019-01-18 ENCOUNTER — Encounter: Payer: Self-pay | Admitting: Family Medicine

## 2019-01-18 ENCOUNTER — Other Ambulatory Visit: Payer: Self-pay

## 2019-01-18 ENCOUNTER — Ambulatory Visit (INDEPENDENT_AMBULATORY_CARE_PROVIDER_SITE_OTHER): Payer: BC Managed Care – PPO | Admitting: Family Medicine

## 2019-01-18 VITALS — BP 128/72 | HR 81 | Temp 98.0°F | Resp 18 | Ht 63.0 in | Wt 221.8 lb

## 2019-01-18 DIAGNOSIS — M546 Pain in thoracic spine: Secondary | ICD-10-CM | POA: Diagnosis not present

## 2019-01-18 DIAGNOSIS — R079 Chest pain, unspecified: Secondary | ICD-10-CM

## 2019-01-18 DIAGNOSIS — R0789 Other chest pain: Secondary | ICD-10-CM | POA: Insufficient documentation

## 2019-01-18 MED ORDER — CYCLOBENZAPRINE HCL 10 MG PO TABS: 10.0000 mg | ORAL_TABLET | Freq: Three times a day (TID) | ORAL | 0 refills | Status: DC | PRN

## 2019-01-18 MED FILL — CYCLOBENZAPRINE HCL 10 MG T: 10 | 10 days supply | Qty: 30 | Fill #0

## 2019-01-18 NOTE — Patient Instructions (Signed)
Thoracic Strain A thoracic strain, which is sometimes called a mid-back strain, is an injury to the muscles or tendons that attach to the upper part of your back behind your chest. This type of injury occurs when a muscle is overstretched or overloaded. Thoracic strains can range from mild to severe. Mild strains may involve stretching a muscle or tendon without tearing it. These injuries may heal in 1-2 weeks. More severe strains involve tearing of muscle fibers or tendons. These will cause more pain and may take 6-8 weeks to heal. What are the causes? This condition may be caused by:  Trauma, such as a fall or a hit to the body.  Twisting or overstretching the back. This may result from doing activities that require a lot of energy, such as lifting heavy objects. In some cases, the cause may not be known. What increases the risk? This injury is more common in:  Athletes.  People with obesity. What are the signs or symptoms? The main symptom of this condition is pain in the middle back, especially with movement. Other symptoms include:  Stiffness or limited range of motion.  Sudden muscle tightening (spasms). How is this diagnosed? This condition may be diagnosed based on:  Your symptoms.  Your medical history.  A physical exam.  Imaging tests, such as X-rays or an MRI. How is this treated? This condition may be treated with:  Resting the injured area.  Applying heat and cold to the injured area.  Over-the-counter medicines for pain and inflammation, such as NSAIDs.  Prescription pain medicine or muscle relaxants may be needed for a short time.  Physical therapy. This will involve doing stretching and strengthening exercises. Follow these instructions at home: Managing pain, stiffness, and swelling      If directed, put ice on the injured area. ? Put ice in a plastic bag. ? Place a towel between your skin and the bag. ? Leave the ice on for 20 minutes, 2-3 times  a day.  If directed, apply heat to the affected area as often as told by your health care provider. Use the heat source that your health care provider recommends, such as a moist heat pack or a heating pad. ? Place a towel between your skin and the heat source. ? Leave the heat on for 20-30 minutes. ? Remove the heat if your skin turns bright red. This is especially important if you are unable to feel pain, heat, or cold. You may have a greater risk of getting burned. Activity  Rest and return to your normal activities as told by your health care provider. Ask your health care provider what activities are safe for you.  Do exercises as told by your health care provider. Medicines  Take over-the-counter and prescription medicines only as told by your health care provider.  Ask your health care provider if the medicine prescribed to you: ? Requires you to avoid driving or using heavy machinery. ? Can cause constipation. You may need to take these actions to prevent or treat constipation:  Drink enough fluid to keep your urine pale yellow.  Take over-the-counter or prescription medicines.  Eat foods that are high in fiber, such as beans, whole grains, and fresh fruits and vegetables.  Limit foods that are high in fat and processed sugars, such as fried or sweet foods. Injury prevention To prevent a future mid-back injury:  Always warm up properly before physical activity or sports.  Cool down and stretch after being active.  Use correct form when playing sports and lifting heavy objects. Bend your knees before you lift heavy objects.  Use good posture when sitting and standing.  Stay physically fit and maintain a healthy weight. ? Do at least 150 minutes of moderate-intensity exercise each week, such as brisk walking or water aerobics. ? Do strength exercises at least 2 times each week.  General instructions  Do not use any products that contain nicotine or tobacco, such as  cigarettes, e-cigarettes, and chewing tobacco. If you need help quitting, ask your health care provider.  Keep all follow-up visits as told by your health care provider. This is important. Contact a health care provider if:  Your pain is not helped by medicine.  Your pain or stiffness is getting worse.  You develop pain or stiffness in your neck or lower back. Get help right away if you:  Have shortness of breath.  Have chest pain.  Develop numbness or weakness in your legs or arms.  Have involuntary loss of urine (urinary incontinence). Summary  A thoracic strain, which is sometimes called a mid-back strain, is an injury to the muscles or tendons that attach to the upper part of your back behind your chest.  This type of injury occurs when a muscle is overstretched or overloaded.  Rest and return to your normal activities as told by your health care provider. If directed, apply heat or ice to the affected area as often as told by your health care provider.  Take over-the-counter and prescription medicines only as told by your health care provider.  Contact a health care provider if you have new or worsening symptoms. This information is not intended to replace advice given to you by your health care provider. Make sure you discuss any questions you have with your health care provider. Document Released: 06/05/2003 Document Revised: 01/31/2018 Document Reviewed: 01/31/2018 Elsevier Patient Education  2020 [employer].

## 2019-01-18 NOTE — Assessment & Plan Note (Signed)
ekg--- nsr  No acute changes Probably ms in nature Muscle relaxer and pt has diclofenac at home  F/u pcp in 1-2 weeks prn  Go to ER if cp worsens

## 2019-01-18 NOTE — Assessment & Plan Note (Signed)
Ms in etiology Muscle relaxer  con't diclofenac Deep tissue massage---  Chiropractor  F/u pcp in 2 weeks or sooner prn  Moist heat

## 2019-01-18 NOTE — Progress Notes (Signed)
Patient ID: Sherri Sullivan, female    DOB: Nov 05, 1960  Age: 58 y.o. MRN: [id number]    Subjective:  Subjective  HPI Sherri Sullivan presents for L shouler pain and chest pain x several days.  No known injury.  Pt is not working and has not been doing any strenuous work around the house.  No sob, no palp  Review of Systems  Constitutional: Negative for appetite change, chills, diaphoresis, fatigue, fever and unexpected weight change.  HENT: Negative for congestion and hearing loss.   Eyes: Negative for pain, discharge, redness and visual disturbance.  Respiratory: Negative for cough, chest tightness, shortness of breath and wheezing.   Cardiovascular: Positive for chest pain. Negative for palpitations and leg swelling.  Gastrointestinal: Negative for abdominal pain, blood in stool, constipation, diarrhea, nausea and vomiting.  Endocrine: Negative for cold intolerance, heat intolerance, polydipsia, polyphagia and polyuria.  Genitourinary: Negative for difficulty urinating, dysuria, frequency, hematuria and urgency.  Musculoskeletal: Positive for myalgias. Negative for back pain.  Skin: Negative for rash.  Allergic/Immunologic: Negative for environmental allergies.  Neurological: Negative for dizziness, weakness, light-headedness, numbness and headaches.  Hematological: Does not bruise/bleed easily.  Psychiatric/Behavioral: Negative for suicidal ideas. The patient is not nervous/anxious.     History Past Medical History:  Diagnosis Date  . Anginal pain ([place])   . 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 Back pain   . Chicken pox as a child  . Constipation   . Dizziness   . Dysrhythmia 2013   SVT- treated by ablation by Dr. Lovena Le to f/u with as needed basis   . Encounter for preventative adult health care exam with abnormal findings 02/09/2015  . HTN (hypertension)   . Hypokalemia 02/09/2015  . Joint pain   . Leg edema   . Measles as a  child  . Measles as a child  . Obesity   . Panic attack    during episode of feeling to crowded   . Preventative health care 02/09/2015  . Seasonal allergies   . SOB (shortness of breath) 09/23/11   "a little bit; at rest; before ablation", SOB again now (08/2014- due to lack  of exercise)   . SVT (supraventricular tachycardia) ([place])   . Uterine fibroid 12/17/2012   Cervical polyp per patient Follows with [facility], Dr Evette Cristal    She has a past surgical history that includes Cardiac electrophysiology study and ablation (09/23/11); Abdominal hysterectomy (1990's); Tubal ligation (1980's); supraventricular tachycardia ablation (N/A, 09/23/2011); and Maximum access (mas)posterior lumbar interbody fusion (plif) 1 level (N/A, 09/24/2014).   Her family history includes Breast cancer in her cousin; Cancer in her sister; Diabetes in her maternal aunt, maternal uncle, and mother; Heart disease in her father and mother; Hypertension in her daughter and mother; Kidney disease in her maternal aunt; Obesity in her father and mother; Stroke in her maternal grandmother and mother.She reports that she quit smoking about 34 years ago. Her smoking use included cigarettes. She has a 0.96 pack-year smoking history. She has never used smokeless tobacco. She reports current alcohol use of about 14.0 standard drinks of alcohol per week. She reports that she does not use drugs.  Current Outpatient Medications on File Prior to Visit  Medication Sig Dispense Refill  . atorvastatin (LIPITOR) 10 MG tablet Take 1 tablet (10 mg total) by mouth daily. 90 tablet 3  . cetirizine (ZYRTEC) 10 MG tablet Take 10 mg by  mouth daily.    . diclofenac (VOLTAREN) 75 MG EC tablet TAKE 1 TABLET BY MOUTH TWO  TIMES DAILY 180 tablet 1  . estradiol (ESTRACE) 2 MG tablet TAKE 1 TABLET BY MOUTH  DAILY 90 tablet 0  . fluticasone (FLONASE) 50 MCG/ACT nasal spray USE 2 SPRAYS IN EACH  NOSTRIL DAILY 32 g 1  . Magnesium Citrate 100 MG  TABS Take 2-3 tablets by mouth daily as needed.    . methocarbamol (ROBAXIN) 500 MG tablet Take 1 tablet (500 mg total) by mouth every 6 (six) hours as needed for muscle spasms. 90 tablet 1  . montelukast (SINGULAIR) 10 MG tablet TAKE 1 TABLET BY MOUTH  DAILY AS NEEDED 90 tablet 3  . OMEGA-3 KRILL OIL PO Take 353 mg by mouth daily. MegaRed Joint Care    . potassium chloride SA (KLOR-CON) 20 MEQ tablet TAKE 1 TABLET BY MOUTH  TWICE DAILY 180 tablet 3  . Probiotic Product (PROBIOTIC PO) Take 2 tablets by mouth at bedtime.    . triamterene-hydrochlorothiazide (MAXZIDE-25) 37.5-25 MG tablet Take 1 tablet by mouth daily. 90 tablet 1  . Wheat Dextrin (BENEFIBER) POWD Take 2-3 scoop by mouth daily as needed.     No current facility-administered medications on file prior to visit.      Objective:  Objective  Physical Exam Vitals signs and nursing note reviewed.  Constitutional:      Appearance: She is well-developed.  HENT:     Head: Normocephalic and atraumatic.  Eyes:     Conjunctiva/sclera: Conjunctivae normal.  Neck:     Musculoskeletal: Normal range of motion and neck supple.     Thyroid: No thyromegaly.     Vascular: No carotid bruit or JVD.  Cardiovascular:     Rate and Rhythm: Normal rate and regular rhythm.     Heart sounds: Normal heart sounds. No murmur.  Pulmonary:     Effort: Pulmonary effort is normal. No respiratory distress.     Breath sounds: Normal breath sounds. No wheezing or rales.  Chest:     Chest wall: No tenderness.  Musculoskeletal:        General: Tenderness present.     Right shoulder: She exhibits pain and spasm. She exhibits normal range of motion and no tenderness.       Arms:  Neurological:     Mental Status: She is alert and oriented to person, place, and time.    BP 128/72 (BP Location: Right Arm, Patient Position: Sitting, Cuff Size: Large)   Pulse 81   Temp 98 F (36.7 C) (Temporal)   Resp 18   Ht 5\' 3"  (1.6 m)   Wt 221 lb 12.8 oz (100.6  kg)   SpO2 97%   BMI 39.29 kg/m  Wt Readings from Last 3 Encounters:  01/18/19 221 lb 12.8 oz (100.6 kg)  04/11/18 233 lb (105.7 kg)  01/09/18 231 lb 1.6 oz (104.8 kg)     Lab Results  Component Value Date   WBC 5.0 10/10/2018   HGB 12.7 10/10/2018   HCT 39.0 10/10/2018   PLT 221.0 10/10/2018   GLUCOSE 86 10/10/2018   CHOL 169 10/10/2018   TRIG 158.0 (H) 10/10/2018   HDL 54.00 10/10/2018   LDLDIRECT 143.0 08/29/2017   LDLCALC 83 10/10/2018   ALT 13 10/10/2018   AST 15 10/10/2018   NA 138 10/10/2018   K 4.1 10/10/2018   CL 104 10/10/2018   CREATININE 0.67 10/10/2018   BUN 14 10/10/2018  CO2 25 10/10/2018   TSH 1.04 10/10/2018   HGBA1C 5.3 10/10/2018    Mm 3d Screen Breast Bilateral  Result Date: 03/27/2018 CLINICAL DATA:  Screening. EXAM: DIGITAL SCREENING BILATERAL MAMMOGRAM WITH TOMO AND CAD COMPARISON:  Previous exam(s). ACR Breast Density Category b: There are scattered areas of fibroglandular density. FINDINGS: There are no findings suspicious for malignancy. Images were processed with CAD. IMPRESSION: No mammographic evidence of malignancy. A result letter of this screening mammogram will be mailed directly to the patient. RECOMMENDATION: Screening mammogram in one year. (Code:SM-B-01Y) BI-RADS CATEGORY  1: Negative. Electronically Signed   By: Abelardo Diesel M.D.   On: 03/27/2018 11:18     Assessment & Plan:  Plan  I am having Domingo Sep. Addis start on cyclobenzaprine. I am also having her maintain her OMEGA-3 KRILL OIL PO, cetirizine, Probiotic Product (PROBIOTIC PO), Benefiber, Magnesium Citrate, fluticasone, methocarbamol, triamterene-hydrochlorothiazide, atorvastatin, diclofenac, estradiol, montelukast, and potassium chloride SA.  Meds ordered this encounter  Medications  . cyclobenzaprine (FLEXERIL) 10 MG tablet    Sig: Take 1 tablet (10 mg total) by mouth 3 (three) times daily as needed for muscle spasms.    Dispense:  30 tablet    Refill:  0    Problem  List Items Addressed This Visit    None    Visit Diagnoses    Chest pain, unspecified type    -  Primary   Relevant Orders   EKG 12-Lead (Completed)   Acute left-sided thoracic back pain       Relevant Medications   cyclobenzaprine (FLEXERIL) 10 MG tablet      Follow-up: Return in about 3 weeks (around 02/08/2019), or if symptoms worsen or fail to improve.  Ann Held, DO

## 2019-02-09 ENCOUNTER — Ambulatory Visit: Payer: BC Managed Care – PPO | Admitting: Family Medicine

## 2019-02-28 ENCOUNTER — Telehealth: Payer: Self-pay | Admitting: Family Medicine

## 2019-02-28 DIAGNOSIS — M546 Pain in thoracic spine: Secondary | ICD-10-CM

## 2019-02-28 NOTE — Telephone Encounter (Signed)
Needs refills to go to different pharmacies due to using goodRx and change in insurance   Pt needs the below meds sent to  [facility] [id number], [place] [phone] (Phone) [phone] (Fax)    methocarbamol (ROBAXIN) 500 MG tablet   montelukast (SINGULAIR) 10 MG tablet  cyclobenzaprine (FLEXERIL) 10 MG tablet  atorvastatin (LIPITOR) 10 MG tablet     Pt needs the meds below sent to  [place] [place], [place] [phone] (Phone) [phone] (Fax)   diclofenac (VOLTAREN) 75 MG EC tablet   estradiol (ESTRACE) 2 MG tablet   potassium chloride SA (KLOR-CON) 20 MEQ tablet  triamterene-hydrochlorothiazide (MAXZIDE-25) 37.5-25 MG tablet  fluticasone (FLONASE) 50 MCG/ACT nasal spray

## 2019-03-09 MED ORDER — ATORVASTATIN CALCIUM 10 MG PO TABS: 10.0000 mg | ORAL_TABLET | Freq: Every day | ORAL | 3 refills | Status: DC

## 2019-03-09 MED ORDER — FLUTICASONE PROPIONATE 50 MCG/ACT NA SUSP: 2.0000 | Freq: Every day | NASAL | 1 refills | Status: DC

## 2019-03-09 MED ORDER — TRIAMTERENE-HCTZ 37.5-25 MG PO TABS: 1.0000 | ORAL_TABLET | Freq: Every day | ORAL | 1 refills | Status: DC

## 2019-03-09 MED ORDER — METHOCARBAMOL 500 MG PO TABS: 500.0000 mg | ORAL_TABLET | Freq: Four times a day (QID) | ORAL | 1 refills | Status: DC | PRN

## 2019-03-09 MED ORDER — MONTELUKAST SODIUM 10 MG PO TABS: 10.0000 mg | ORAL_TABLET | Freq: Every day | ORAL | 3 refills | Status: DC | PRN

## 2019-03-09 MED ORDER — POTASSIUM CHLORIDE CRYS ER 20 MEQ PO TBCR: EXTENDED_RELEASE_TABLET | ORAL | 3 refills | Status: DC

## 2019-03-09 MED ORDER — DICLOFENAC SODIUM 75 MG PO TBEC: 75.0000 mg | DELAYED_RELEASE_TABLET | Freq: Two times a day (BID) | ORAL | 1 refills | Status: DC

## 2019-03-09 MED ORDER — ESTRADIOL 2 MG PO TABS: 2.0000 mg | ORAL_TABLET | Freq: Every day | ORAL | 1 refills | Status: DC

## 2019-03-09 MED ORDER — CYCLOBENZAPRINE HCL 10 MG PO TABS: 10.0000 mg | ORAL_TABLET | Freq: Three times a day (TID) | ORAL | 0 refills | Status: DC | PRN

## 2019-03-10 NOTE — Telephone Encounter (Signed)
Sent medications to pharmacy

## 2019-04-17 ENCOUNTER — Other Ambulatory Visit (HOSPITAL_BASED_OUTPATIENT_CLINIC_OR_DEPARTMENT_OTHER): Payer: Self-pay | Admitting: Family Medicine

## 2019-04-17 DIAGNOSIS — Z1231 Encounter for screening mammogram for malignant neoplasm of breast: Secondary | ICD-10-CM

## 2019-04-19 ENCOUNTER — Ambulatory Visit (HOSPITAL_BASED_OUTPATIENT_CLINIC_OR_DEPARTMENT_OTHER)
Admission: RE | Admit: 2019-04-19 | Discharge: 2019-04-19 | Disposition: A | Discharge status: Home / Self Care | Payer: PRIVATE HEALTH INSURANCE | Source: Ambulatory Visit | Attending: Family Medicine | Admitting: Family Medicine

## 2019-04-19 ENCOUNTER — Other Ambulatory Visit: Payer: Self-pay

## 2019-04-19 DIAGNOSIS — Z1231 Encounter for screening mammogram for malignant neoplasm of breast: Secondary | ICD-10-CM | POA: Diagnosis present

## 2019-04-23 ENCOUNTER — Other Ambulatory Visit: Payer: Self-pay | Admitting: Family Medicine

## 2019-04-23 DIAGNOSIS — R928 Other abnormal and inconclusive findings on diagnostic imaging of breast: Secondary | ICD-10-CM

## 2019-05-01 ENCOUNTER — Other Ambulatory Visit: Payer: Self-pay

## 2019-05-01 ENCOUNTER — Ambulatory Visit
Admission: RE | Admit: 2019-05-01 | Discharge: 2019-05-01 | Disposition: A | Discharge status: Home / Self Care | Payer: PRIVATE HEALTH INSURANCE | Source: Ambulatory Visit | Attending: Family Medicine | Admitting: Family Medicine

## 2019-05-01 DIAGNOSIS — R928 Other abnormal and inconclusive findings on diagnostic imaging of breast: Secondary | ICD-10-CM

## 2019-07-09 ENCOUNTER — Encounter: Payer: Self-pay | Admitting: Family Medicine

## 2019-09-14 ENCOUNTER — Other Ambulatory Visit: Payer: Self-pay | Admitting: Family Medicine

## 2019-10-25 ENCOUNTER — Encounter: Payer: Self-pay | Admitting: *Deleted

## 2019-11-15 ENCOUNTER — Encounter: Payer: Self-pay | Admitting: Family Medicine

## 2019-11-16 ENCOUNTER — Encounter: Payer: Self-pay | Admitting: Internal Medicine

## 2019-11-16 ENCOUNTER — Ambulatory Visit (INDEPENDENT_AMBULATORY_CARE_PROVIDER_SITE_OTHER): Payer: Medicare Other | Admitting: Internal Medicine

## 2019-11-16 ENCOUNTER — Other Ambulatory Visit: Payer: Self-pay

## 2019-11-16 VITALS — BP 124/76 | HR 76 | Temp 97.8°F | Resp 16 | Ht 63.0 in | Wt 225.2 lb

## 2019-11-16 DIAGNOSIS — R109 Unspecified abdominal pain: Secondary | ICD-10-CM | POA: Diagnosis not present

## 2019-11-16 LAB — POC URINALSYSI DIPSTICK (AUTOMATED)
Bilirubin, UA: NEGATIVE
Blood, UA: NEGATIVE
Glucose, UA: NEGATIVE
Ketones, UA: NEGATIVE
Leukocytes, UA: NEGATIVE
Nitrite, UA: NEGATIVE
Protein, UA: POSITIVE — AB
Spec Grav, UA: 1.025 (ref 1.010–1.025)
Urobilinogen, UA: 0.2 E.U./dL
pH, UA: 6 (ref 5.0–8.0)

## 2019-11-16 LAB — URINALYSIS, ROUTINE W REFLEX MICROSCOPIC
Bilirubin Urine: NEGATIVE
Hgb urine dipstick: NEGATIVE
Ketones, ur: NEGATIVE
Leukocytes,Ua: NEGATIVE
Nitrite: NEGATIVE
Specific Gravity, Urine: 1.02 (ref 1.000–1.030)
Total Protein, Urine: NEGATIVE
Urine Glucose: NEGATIVE
Urobilinogen, UA: 0.2 (ref 0.0–1.0)
pH: 6 (ref 5.0–8.0)

## 2019-11-16 NOTE — Progress Notes (Signed)
Pre visit review using our clinic review tool, if applicable. No additional management support is needed unless otherwise documented below in the visit note. 

## 2019-11-16 NOTE — Progress Notes (Signed)
Subjective:    Patient ID: Sherri Sullivan, female    DOB: September 20, 1960, 59 y.o.   MRN: [id number]  DOS:  11/16/2019 Type of visit - description: Acute About 2 weeks history of right flank pain. The pain is steady, increase and decrease in intensity, never severe. Worse at night, worse with turning her torso?Marland Kitchen No change with eating.   Review of Systems Denies fever chills No recent fall or injury No rash No nausea, vomiting, diarrhea.  Appetite normal No dysuria, gross hematuria or difficulty urinating.  Some urinary frequency?  Past Medical History:  Diagnosis Date  . Anginal pain ([place])   . 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 Back pain   . Chicken pox as a child  . Constipation   . Dizziness   . Dysrhythmia 2013   SVT- treated by ablation by Dr. Lovena Le to f/u with as needed basis   . Encounter for preventative adult health care exam with abnormal findings 02/09/2015  . HTN (hypertension)   . Hypokalemia 02/09/2015  . Joint pain   . Leg edema   . Measles as a child  . Measles as a child  . Obesity   . Panic attack    during episode of feeling to crowded   . Preventative health care 02/09/2015  . Seasonal allergies   . SOB (shortness of breath) 09/23/11   "a little bit; at rest; before ablation", SOB again now (08/2014- due to lack  of exercise)   . SVT (supraventricular tachycardia) ([place])   . Uterine fibroid 12/17/2012   Cervical polyp per patient Follows with [facility], Dr Evette Cristal    Past Surgical History:  Procedure Laterality Date  . ABDOMINAL HYSTERECTOMY  1990's  . CARDIAC ELECTROPHYSIOLOGY STUDY AND ABLATION  09/23/11  . MAXIMUM ACCESS (MAS)POSTERIOR LUMBAR INTERBODY FUSION (PLIF) 1 LEVEL N/A 09/24/2014   Procedure: L5-S1 MAS PLIF ;  Surgeon: Erline Levine, MD;  Location: [facility];  Service: Neurosurgery;  Laterality: N/A;  L5-S1 MAS PLIF fusion  . SUPRAVENTRICULAR TACHYCARDIA ABLATION N/A  09/23/2011   Procedure: SUPRAVENTRICULAR TACHYCARDIA ABLATION;  Surgeon: Evans Lance, MD;  Location: [facility] CATH LAB;  Service: Cardiovascular;  Laterality: N/A;  . TUBAL LIGATION  1980's    Allergies as of 11/16/2019      Reactions   Doxycycline Diarrhea, Nausea And Vomiting      Medication List       Accurate as of November 16, 2019 11:45 AM. If you have any questions, ask your nurse or doctor.        atorvastatin 10 MG tablet Commonly known as: LIPITOR Take 1 tablet (10 mg total) by mouth daily.   Benefiber Powd Take 2-3 scoop by mouth daily as needed.   cetirizine 10 MG tablet Commonly known as: ZYRTEC Take 10 mg by mouth daily.   cyclobenzaprine 10 MG tablet Commonly known as: FLEXERIL Take 1 tablet (10 mg total) by mouth 3 (three) times daily as needed for muscle spasms.   diclofenac 75 MG EC tablet Commonly known as: VOLTAREN Take 1 tablet (75 mg total) by mouth 2 (two) times daily.   estradiol 2 MG tablet Commonly known as: ESTRACE Take 1 tablet by mouth once daily   fluticasone 50 MCG/ACT nasal spray Commonly known as: FLONASE Place 2 sprays into both nostrils daily.   Magnesium Citrate 100 MG Tabs Take 2-3 tablets by mouth daily as needed.  methocarbamol 500 MG tablet Commonly known as: ROBAXIN Take 1 tablet (500 mg total) by mouth every 6 (six) hours as needed for muscle spasms.   montelukast 10 MG tablet Commonly known as: SINGULAIR Take 1 tablet (10 mg total) by mouth daily as needed.   OMEGA-3 KRILL OIL PO Take 353 mg by mouth daily. MegaRed Joint Care   potassium chloride SA 20 MEQ tablet Commonly known as: KLOR-CON TAKE 1 TABLET BY MOUTH  TWICE DAILY   PROBIOTIC PO Take 2 tablets by mouth at bedtime.   triamterene-hydrochlorothiazide 37.5-25 MG tablet Commonly known as: MAXZIDE-25 Take 1 tablet by mouth once daily          Objective:   Physical Exam Abdominal:    Skin:        BP 124/76 (BP Location: Left Arm, Patient  Position: Sitting, Cuff Size: Normal)   Pulse 76   Temp 97.8 F (36.6 C) (Oral)   Resp 16   Ht 5\' 3"  (1.6 m)   Wt 225 lb 4 oz (102.2 kg)   SpO2 98%   BMI 39.90 kg/m  General:   Well developed, NAD, BMI noted.  HEENT:  Normocephalic . Face symmetric, atraumatic Lungs:  CTA B Normal respiratory effort, no intercostal retractions, no accessory muscle use. Heart: RRR,  no murmur.  Abdomen:  Not distended, soft, see graphic.  Question of CVA tenderness R. MSK: Slightly TTP at the lumbar spine, not a new issue per patient. Skin: Not pale. Not jaundice Lower extremities: no pretibial edema bilaterally  Neurologic:  alert & oriented X3.  Speech normal, gait appropriate for age and unassisted Psych--  Cognition and judgment appear intact.  Cooperative with normal attention span and concentration.  Behavior appropriate. No anxious or depressed appearing.     Assessment    59 year old female, PMH includes HTN, SVT, colon polyps, hyperglycemia, high cholesterol, abdominal hysterectomy,   Colonoscopy 09-2017 showing polyps presents with:  Right-sided flank/abdominal pain: Etiology unclear, on clinical grounds I doubt appendicitis, gallbladder pathology, diverticulitis. Possibly a UTI or urolithiasis? Also possible a MSK issue. Udip negative Plan: Wait for a UA urine culture, treat if appropriate.  Seek attention if symptoms increase, see AVS.  If symptoms persist consider imaging.   This visit occurred during the SARS-CoV-2 public health emergency.  Safety protocols were in place, including screening questions prior to the visit, additional usage of staff PPE, and extensive cleaning of exam room while observing appropriate contact time as indicated for disinfecting solutions.

## 2019-11-16 NOTE — Patient Instructions (Signed)
We are sending your urine to be cultured to rule out infection  In the meantime take Tylenol as needed for pain  Call anytime if you have fever, chills, severe abdominal pain, or other worrisome symptoms.

## 2019-11-17 LAB — URINE CULTURE
MICRO NUMBER:: 10852969
Result:: NO GROWTH
SPECIMEN QUALITY:: ADEQUATE

## 2019-11-19 MED ORDER — AMOXICILLIN-POT CLAVULANATE 875-125 MG PO TABS: 1.0000 | ORAL_TABLET | Freq: Two times a day (BID) | ORAL | 0 refills | Status: DC

## 2019-12-23 ENCOUNTER — Other Ambulatory Visit: Payer: Self-pay | Admitting: Family Medicine

## 2020-02-26 ENCOUNTER — Encounter: Payer: Self-pay | Admitting: Family Medicine

## 2020-02-26 ENCOUNTER — Other Ambulatory Visit: Payer: Self-pay

## 2020-02-26 ENCOUNTER — Ambulatory Visit (INDEPENDENT_AMBULATORY_CARE_PROVIDER_SITE_OTHER): Payer: Medicare Other | Admitting: Family Medicine

## 2020-02-26 VITALS — BP 120/76 | HR 90 | Temp 98.0°F | Resp 18 | Wt 225.8 lb

## 2020-02-26 DIAGNOSIS — R739 Hyperglycemia, unspecified: Secondary | ICD-10-CM

## 2020-02-26 DIAGNOSIS — I10 Essential (primary) hypertension: Secondary | ICD-10-CM

## 2020-02-26 DIAGNOSIS — E559 Vitamin D deficiency, unspecified: Secondary | ICD-10-CM

## 2020-02-26 DIAGNOSIS — M5442 Lumbago with sciatica, left side: Secondary | ICD-10-CM

## 2020-02-26 DIAGNOSIS — M542 Cervicalgia: Secondary | ICD-10-CM

## 2020-02-26 DIAGNOSIS — Z23 Encounter for immunization: Secondary | ICD-10-CM | POA: Diagnosis not present

## 2020-02-26 DIAGNOSIS — E785 Hyperlipidemia, unspecified: Secondary | ICD-10-CM | POA: Diagnosis not present

## 2020-02-26 DIAGNOSIS — M5441 Lumbago with sciatica, right side: Secondary | ICD-10-CM

## 2020-02-26 DIAGNOSIS — E669 Obesity, unspecified: Secondary | ICD-10-CM

## 2020-02-26 DIAGNOSIS — G8929 Other chronic pain: Secondary | ICD-10-CM

## 2020-02-26 LAB — COMPREHENSIVE METABOLIC PANEL
ALT: 12 U/L (ref 0–35)
AST: 14 U/L (ref 0–37)
Albumin: 4 g/dL (ref 3.5–5.2)
Alkaline Phosphatase: 51 U/L (ref 39–117)
BUN: 15 mg/dL (ref 6–23)
CO2: 27 mEq/L (ref 19–32)
Calcium: 9.9 mg/dL (ref 8.4–10.5)
Chloride: 106 mEq/L (ref 96–112)
Creatinine, Ser: 0.58 mg/dL (ref 0.40–1.20)
GFR: 98.88 mL/min (ref >60.00)
Glucose, Bld: 95 mg/dL (ref 70–99)
Potassium: 4.1 mEq/L (ref 3.5–5.1)
Sodium: 139 mEq/L (ref 135–145)
Total Bilirubin: 0.9 mg/dL (ref 0.2–1.2)
Total Protein: 6.9 g/dL (ref 6.0–8.3)

## 2020-02-26 LAB — CBC
HCT: 40.8 % (ref 36.0–46.0)
Hemoglobin: 13.4 g/dL (ref 12.0–15.0)
MCHC: 32.9 g/dL (ref 30.0–36.0)
MCV: 88.6 fl (ref 78.0–100.0)
Platelets: 217 10*3/uL (ref 150.0–400.0)
RBC: 4.6 Mil/uL (ref 3.87–5.11)
RDW: 15.9 % — ABNORMAL HIGH (ref 11.5–15.5)
WBC: 5.5 10*3/uL (ref 4.0–10.5)

## 2020-02-26 LAB — VITAMIN D 25 HYDROXY (VIT D DEFICIENCY, FRACTURES): VITD: 41.95 ng/mL (ref 30.00–100.00)

## 2020-02-26 LAB — LIPID PANEL
Cholesterol: 172 mg/dL (ref 0–200)
HDL: 61.4 mg/dL (ref >39.00)
LDL Cholesterol: 88 mg/dL (ref 0–99)
NonHDL: 111.05
Total CHOL/HDL Ratio: 3
Triglycerides: 115 mg/dL (ref 0.0–149.0)
VLDL: 23 mg/dL (ref 0.0–40.0)

## 2020-02-26 LAB — HEMOGLOBIN A1C: Hgb A1c MFr Bld: 5.4 % (ref 4.6–6.5)

## 2020-02-26 LAB — TSH: TSH: 1.33 u[IU]/mL (ref 0.35–4.50)

## 2020-02-26 MED ORDER — FLUTICASONE PROPIONATE 50 MCG/ACT NA SUSP: 2.0000 | Freq: Every day | NASAL | 1 refills | Status: DC

## 2020-02-26 MED ORDER — POTASSIUM CHLORIDE CRYS ER 20 MEQ PO TBCR: EXTENDED_RELEASE_TABLET | ORAL | 3 refills | Status: DC

## 2020-02-26 MED ORDER — TRIAMTERENE-HCTZ 37.5-25 MG PO TABS: 1.0000 | ORAL_TABLET | Freq: Every day | ORAL | 3 refills | Status: DC

## 2020-02-26 MED ORDER — DICLOFENAC SODIUM 75 MG PO TBEC: 75.0000 mg | DELAYED_RELEASE_TABLET | Freq: Two times a day (BID) | ORAL | 3 refills | Status: DC

## 2020-02-26 MED ORDER — ATORVASTATIN CALCIUM 10 MG PO TABS: 10.0000 mg | ORAL_TABLET | Freq: Every day | ORAL | 3 refills | Status: DC

## 2020-02-26 NOTE — Patient Instructions (Addendum)
MIND or DASH diet for longevity and weight loss  [employer]   Preventive Care 51-59 Years Old, Female Preventive care refers to visits with your health care provider and lifestyle choices that can promote health and wellness. This includes:  A yearly physical exam. This may also be called an annual well check.  Regular dental visits and eye exams.  Immunizations.  Screening for certain conditions.  Healthy lifestyle choices, such as eating a healthy diet, getting regular exercise, not using drugs or products that contain nicotine and tobacco, and limiting alcohol use. What can I expect for my preventive care visit? Physical exam Your health care provider will check your:  Height and weight. This may be used to calculate body mass index (BMI), which tells if you are at a healthy weight.  Heart rate and blood pressure.  Skin for abnormal spots. Counseling Your health care provider may ask you questions about your:  Alcohol, tobacco, and drug use.  Emotional well-being.  Home and relationship well-being.  Sexual activity.  Eating habits.  Work and work Statistician.  Method of birth control.  Menstrual cycle.  Pregnancy history. What immunizations do I need?  Influenza (flu) vaccine  This is recommended every year. Tetanus, diphtheria, and pertussis (Tdap) vaccine  You may need a Td booster every 10 years. Varicella (chickenpox) vaccine  You may need this if you have not been vaccinated. Zoster (shingles) vaccine  You may need this after age 75. Measles, mumps, and rubella (MMR) vaccine  You may need at least one dose of MMR if you were born in 1957 or later. You may also need a second dose. Pneumococcal conjugate (PCV13) vaccine  You may need this if you have certain conditions and were not previously vaccinated. Pneumococcal polysaccharide (PPSV23) vaccine  You may need one or two doses if you smoke cigarettes or if you have certain  conditions. Meningococcal conjugate (MenACWY) vaccine  You may need this if you have certain conditions. Hepatitis A vaccine  You may need this if you have certain conditions or if you travel or work in places where you may be exposed to hepatitis A. Hepatitis B vaccine  You may need this if you have certain conditions or if you travel or work in places where you may be exposed to hepatitis B. Haemophilus influenzae type b (Hib) vaccine  You may need this if you have certain conditions. Human papillomavirus (HPV) vaccine  If recommended by your health care provider, you may need three doses over 6 months. You may receive vaccines as individual doses or as more than one vaccine together in one shot (combination vaccines). Talk with your health care provider about the risks and benefits of combination vaccines. What tests do I need? Blood tests  Lipid and cholesterol levels. These may be checked every 5 years, or more frequently if you are over 31 years old.  Hepatitis C test.  Hepatitis B test. Screening  Lung cancer screening. You may have this screening every year starting at age 40 if you have a 30-pack-year history of smoking and currently smoke or have quit within the past 15 years.  Colorectal cancer screening. All adults should have this screening starting at age 32 and continuing until age 7. Your health care provider may recommend screening at age 76 if you are at increased risk. You will have tests every 1-10 years, depending on your results and the type of screening test.  Diabetes screening. This is done by checking your blood  sugar (glucose) after you have not eaten for a while (fasting). You may have this done every 1-3 years.  Mammogram. This may be done every 1-2 years. Talk with your health care provider about when you should start having regular mammograms. This may depend on whether you have a family history of breast cancer.  BRCA-related cancer screening. This  may be done if you have a family history of breast, ovarian, tubal, or peritoneal cancers.  Pelvic exam and Pap test. This may be done every 3 years starting at age 46. Starting at age 37, this may be done every 5 years if you have a Pap test in combination with an HPV test. Other tests  Sexually transmitted disease (STD) testing.  Bone density scan. This is done to screen for osteoporosis. You may have this scan if you are at high risk for osteoporosis. Follow these instructions at home: Eating and drinking  Eat a diet that includes fresh fruits and vegetables, whole grains, lean protein, and low-fat dairy.  Take vitamin and mineral supplements as recommended by your health care provider.  Do not drink alcohol if: ? Your health care provider tells you not to drink. ? You are pregnant, may be pregnant, or are planning to become pregnant.  If you drink alcohol: ? Limit how much you have to 0-1 drink a day. ? Be aware of how much alcohol is in your drink. In the [place] one drink equals one 12 oz bottle of beer (355 mL), one 5 oz glass of wine (148 mL), or one 1 oz glass of hard liquor (44 mL). Lifestyle  Take daily care of your teeth and gums.  Stay active. Exercise for at least 30 minutes on 5 or more days each week.  Do not use any products that contain nicotine or tobacco, such as cigarettes, e-cigarettes, and chewing tobacco. If you need help quitting, ask your health care provider.  If you are sexually active, practice safe sex. Use a condom or other form of birth control (contraception) in order to prevent pregnancy and STIs (sexually transmitted infections).  If told by your health care provider, take low-dose aspirin daily starting at age 39. What's next?  Visit your health care provider once a year for a well check visit.  Ask your health care provider how often you should have your eyes and teeth checked.  Stay up to date on all vaccines. This information is not  intended to replace advice given to you by your health care provider. Make sure you discuss any questions you have with your health care provider. Document Revised: 11/24/2017 Document Reviewed: 11/24/2017 Elsevier Patient Education  2020 [employer].

## 2020-02-26 NOTE — Assessment & Plan Note (Signed)
Well controlled, no changes to meds. Encouraged heart healthy diet such as the DASH diet and exercise as tolerated.  °

## 2020-02-26 NOTE — Assessment & Plan Note (Signed)
DDD disease she continues to follow with Dr Vertell Limber is making an appt soon

## 2020-02-26 NOTE — Assessment & Plan Note (Signed)
hgba1c acceptable, minimize simple carbs. Increase exercise as tolerated.  

## 2020-02-26 NOTE — Assessment & Plan Note (Signed)
Encouraged heart healthy diet, increase exercise, avoid trans fats, consider a krill oil cap daily 

## 2020-02-26 NOTE — Assessment & Plan Note (Signed)
Is on disability and has been followed by Dr Vertell Limber.

## 2020-02-26 NOTE — Assessment & Plan Note (Addendum)
supplement and monitor 

## 2020-02-26 NOTE — Assessment & Plan Note (Signed)
Encouraged DASH or MIND diet, decrease po intake and increase exercise as tolerated. Needs 7-8 hours of sleep nightly. Avoid trans fats, eat small, frequent meals every 4-5 hours with lean proteins, complex carbs and healthy fats. Minimize simple carbs

## 2020-02-28 NOTE — Progress Notes (Signed)
Subjective:    Patient ID: Sherri Sullivan, female    DOB: [birth year], 59 y.o.   MRN: [id number]  Chief Complaint  Patient presents with  . Follow-up    HPI Patient is in today for follow up on chronic medical concerns. No recent febrile illness or hospitalizations. No polyuria or polydipsia. Continues to struggle with back and neck pain and is following closely with neurosurgery. She has an appt soon to discuss next steps. No recent fall or injury. She is trying to eat well and stay as active as tolerated. Denies CP/palp/SOB/HA/congestion/fevers/GI or GU c/o. Taking meds as prescribed  Past Medical History:  Diagnosis Date  . Anginal pain ([place])   . 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 Back pain   . Chicken pox as a child  . Constipation   . Dizziness   . Dysrhythmia 2013   SVT- treated by ablation by Dr. Lovena Le to f/u with as needed basis   . Encounter for preventative adult health care exam with abnormal findings 02/09/2015  . HTN (hypertension)   . Hypokalemia 02/09/2015  . Joint pain   . Leg edema   . Measles as a child  . Measles as a child  . Obesity   . Panic attack    during episode of feeling to crowded   . Preventative health care 02/09/2015  . Seasonal allergies   . SOB (shortness of breath) 09/23/11   "a little bit; at rest; before ablation", SOB again now (08/2014- due to lack  of exercise)   . SVT (supraventricular tachycardia) ([place])   . Uterine fibroid 12/17/2012   Cervical polyp per patient Follows with [facility], Dr Evette Cristal    Past Surgical History:  Procedure Laterality Date  . ABDOMINAL HYSTERECTOMY  1990's  . CARDIAC ELECTROPHYSIOLOGY STUDY AND ABLATION  09/23/11  . MAXIMUM ACCESS (MAS)POSTERIOR LUMBAR INTERBODY FUSION (PLIF) 1 LEVEL N/A 09/24/2014   Procedure: L5-S1 MAS PLIF ;  Surgeon: Erline Levine, MD;  Location: [facility];  Service: Neurosurgery;  Laterality: N/A;  L5-S1 MAS PLIF  fusion  . SUPRAVENTRICULAR TACHYCARDIA ABLATION N/A 09/23/2011   Procedure: SUPRAVENTRICULAR TACHYCARDIA ABLATION;  Surgeon: Evans Lance, MD;  Location[facility] CATH LAB;  Service: Cardiovascular;  Laterality: N/A;  . TUBAL LIGATION  1980's    Family History  Problem Relation Age of Onset  . Cancer Sister        lung  . Diabetes Mother        type 2  . Heart disease Mother   . Hypertension Mother   . Stroke Mother   . Obesity Mother   . Heart disease Father   . Obesity Father   . Hypertension Daughter   . Stroke Maternal Grandmother   . Diabetes Maternal Aunt   . Kidney disease Maternal Aunt   . Diabetes Maternal Uncle   . Breast cancer Cousin   . Colon cancer Neg Hx   . Esophageal cancer Neg Hx   . Rectal cancer Neg Hx   . Stomach cancer Neg Hx     Social History   Socioeconomic History  . Marital status: Married    Spouse name: Marcello Moores  . Number of children: 2  . Years of education: Not on file  . Highest education level: Not on file  Occupational History  . Occupation: Retired  Tobacco Use  . Smoking status: Former Smoker    Packs/day: 0.12  Years: 8.00    Pack years: 0.96    Types: Cigarettes    Quit date: 03/29/1984    Years since quitting: 35.9  . Smokeless tobacco: Never Used  Vaping Use  . Vaping Use: Never used  Substance and Sexual Activity  . Alcohol use: Yes    Alcohol/week: 14.0 standard drinks    Types: 14 Glasses of wine per week    Comment: Drinks 2-3 glasses wine on the weekends  . Drug use: No  . Sexual activity: Not Currently  Other Topics Concern  . Not on file  Social History Narrative   No major restrictions   Lives with husband, daughter, grand daughter in a 2 story home.    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   Social Determinants of Health   Financial Resource Strain:   . Difficulty of Paying Living Expenses: Not on file  Food Insecurity:   . Worried About Charity fundraiser in the Last  Year: Not on file  . Ran Out of Food in the Last Year: Not on file  Transportation Needs:   . Lack of Transportation (Medical): Not on file  . Lack of Transportation (Non-Medical): Not on file  Physical Activity:   . Days of Exercise per Week: Not on file  . Minutes of Exercise per Session: Not on file  Stress:   . Feeling of Stress : Not on file  Social Connections:   . Frequency of Communication with Friends and Family: Not on file  . Frequency of Social Gatherings with Friends and Family: Not on file  . Attends Religious Services: Not on file  . Active Member of Clubs or Organizations: Not on file  . Attends Archivist Meetings: Not on file  . Marital Status: Not on file  Intimate Partner Violence:   . Fear of Current or Ex-Partner: Not on file  . Emotionally Abused: Not on file  . Physically Abused: Not on file  . Sexually Abused: Not on file    Outpatient Medications Prior to Visit  Medication Sig Dispense Refill  . estradiol (ESTRACE) 2 MG tablet Take 1 tablet by mouth once daily 90 tablet 0  . methocarbamol (ROBAXIN) 500 MG tablet Take 1 tablet (500 mg total) by mouth every 6 (six) hours as needed for muscle spasms. 90 tablet 1  . montelukast (SINGULAIR) 10 MG tablet Take 1 tablet (10 mg total) by mouth daily as needed. 90 tablet 3  . OMEGA-3 KRILL OIL PO Take 353 mg by mouth daily. MegaRed Joint Care    . Probiotic Product (PROBIOTIC PO) Take 2 tablets by mouth at bedtime.    . Wheat Dextrin (BENEFIBER) POWD Take 2-3 scoop by mouth daily as needed.    Marland Kitchen atorvastatin (LIPITOR) 10 MG tablet Take 1 tablet (10 mg total) by mouth daily. 90 tablet 3  . cetirizine (ZYRTEC) 10 MG tablet Take 10 mg by mouth daily.    . diclofenac (VOLTAREN) 75 MG EC tablet Take 1 tablet (75 mg total) by mouth 2 (two) times daily. 180 tablet 1  . fluticasone (FLONASE) 50 MCG/ACT nasal spray Place 2 sprays into both nostrils daily. 32 g 1  . potassium chloride SA (KLOR-CON) 20 MEQ tablet  TAKE 1 TABLET BY MOUTH  TWICE DAILY 180 tablet 3  . triamterene-hydrochlorothiazide (MAXZIDE-25) 37.5-25 MG tablet Take 1 tablet by mouth daily. 90 tablet 0  . amoxicillin-clavulanate (AUGMENTIN) 875-125 MG tablet Take  1 tablet by mouth 2 (two) times daily. 14 tablet 0  . cyclobenzaprine (FLEXERIL) 10 MG tablet Take 1 tablet (10 mg total) by mouth 3 (three) times daily as needed for muscle spasms. 30 tablet 0  . Magnesium Citrate 100 MG TABS Take 2-3 tablets by mouth daily as needed.     No facility-administered medications prior to visit.    Allergies  Allergen Reactions  . Doxycycline Diarrhea and Nausea And Vomiting    Review of Systems  Constitutional: Negative for fever and malaise/fatigue.  HENT: Negative for congestion.   Eyes: Negative for blurred vision.  Respiratory: Negative for shortness of breath.   Cardiovascular: Negative for chest pain, palpitations and leg swelling.  Gastrointestinal: Negative for abdominal pain, blood in stool and nausea.  Genitourinary: Negative for dysuria and frequency.  Musculoskeletal: Positive for back pain. Negative for falls.  Skin: Negative for rash.  Neurological: Negative for dizziness, loss of consciousness and headaches.  Endo/Heme/Allergies: Negative for environmental allergies.  Psychiatric/Behavioral: Negative for depression. The patient is not nervous/anxious.        Objective:    Physical Exam Vitals and nursing note reviewed.  Constitutional:      General: She is not in acute distress.    Appearance: She is well-developed.  HENT:     Head: Normocephalic and atraumatic.     Nose: Nose normal.  Eyes:     General:        Right eye: No discharge.        Left eye: No discharge.  Cardiovascular:     Rate and Rhythm: Normal rate and regular rhythm.     Heart sounds: No murmur heard.   Pulmonary:     Effort: Pulmonary effort is normal.     Breath sounds: Normal breath sounds.  Abdominal:     General: Bowel sounds are  normal.     Palpations: Abdomen is soft.     Tenderness: There is no abdominal tenderness.  Musculoskeletal:     Cervical back: Normal range of motion and neck supple.  Skin:    General: Skin is warm and dry.  Neurological:     Mental Status: She is alert and oriented to person, place, and time.     BP 120/76   Pulse 90   Temp 98 F (36.7 C) (Oral)   Resp 18   Wt 225 lb 12.8 oz (102.4 kg)   SpO2 94%   BMI 40.00 kg/m  Wt Readings from Last 3 Encounters:  02/26/20 225 lb 12.8 oz (102.4 kg)  11/16/19 225 lb 4 oz (102.2 kg)  01/18/19 221 lb 12.8 oz (100.6 kg)    Diabetic Foot Exam - Simple   No data filed     Lab Results  Component Value Date   WBC 5.5 02/26/2020   HGB 13.4 02/26/2020   HCT 40.8 02/26/2020   PLT 217.0 02/26/2020   GLUCOSE 95 02/26/2020   CHOL 172 02/26/2020   TRIG 115.0 02/26/2020   HDL 61.40 02/26/2020   LDLDIRECT 143.0 08/29/2017   LDLCALC 88 02/26/2020   ALT 12 02/26/2020   AST 14 02/26/2020   NA 139 02/26/2020   K 4.1 02/26/2020   CL 106 02/26/2020   CREATININE 0.58 02/26/2020   BUN 15 02/26/2020   CO2 27 02/26/2020   TSH 1.33 02/26/2020   HGBA1C 5.4 02/26/2020    Lab Results  Component Value Date   TSH 1.33 02/26/2020   Lab Results  Component Value Date  WBC 5.5 02/26/2020   HGB 13.4 02/26/2020   HCT 40.8 02/26/2020   MCV 88.6 02/26/2020   PLT 217.0 02/26/2020   Lab Results  Component Value Date   NA 139 02/26/2020   K 4.1 02/26/2020   CO2 27 02/26/2020   GLUCOSE 95 02/26/2020   BUN 15 02/26/2020   CREATININE 0.58 02/26/2020   BILITOT 0.9 02/26/2020   ALKPHOS 51 02/26/2020   AST 14 02/26/2020   ALT 12 02/26/2020   PROT 6.9 02/26/2020   ALBUMIN 4.0 02/26/2020   CALCIUM 9.9 02/26/2020   ANIONGAP 9 09/13/2014   GFR 98.88 02/26/2020   Lab Results  Component Value Date   CHOL 172 02/26/2020   Lab Results  Component Value Date   HDL 61.40 02/26/2020   Lab Results  Component Value Date   LDLCALC 88 02/26/2020    Lab Results  Component Value Date   TRIG 115.0 02/26/2020   Lab Results  Component Value Date   CHOLHDL 3 02/26/2020   Lab Results  Component Value Date   HGBA1C 5.4 02/26/2020       Assessment & Plan:   Problem List Items Addressed This Visit    Hyperlipidemia    Encouraged heart healthy diet, increase exercise, avoid trans fats, consider a krill oil cap daily      Relevant Orders   Comprehensive metabolic panel (Completed)   Lipid panel (Completed)   LOW BACK PAIN    Is on disability and has been followed by Dr Vertell Limber.       Obesity    Encouraged DASH or MIND diet, decrease po intake and increase exercise as tolerated. Needs 7-8 hours of sleep nightly. Avoid trans fats, eat small, frequent meals every 4-5 hours with lean proteins, complex carbs and healthy fats. Minimize simple carbs      Essential hypertension, benign    Well controlled, no changes to meds. Encouraged heart healthy diet such as the DASH diet and exercise as tolerated.       Relevant Orders   CBC (Completed)   Comprehensive metabolic panel (Completed)   TSH (Completed)   Neck pain    DDD disease she continues to follow with Dr Vertell Limber is making an appt soon      Vitamin D deficiency    supplement and monitor      Relevant Orders   VITAMIN D 25 Hydroxy (Vit-D Deficiency, Fractures) (Completed)   Hyperglycemia    hgba1c acceptable, minimize simple carbs. Increase exercise as tolerated.       Relevant Orders   Hemoglobin A1c (Completed)    Other Visit Diagnoses    Need for influenza vaccination    -  Primary   Relevant Orders   Flu Vaccine QUAD 36+ mos IM (Fluarix, Fluzone & Afluria Quad PF (Completed)      I have discontinued [place]'s cetirizine, Magnesium Citrate, cyclobenzaprine, and amoxicillin-clavulanate. I am also having her maintain her OMEGA-3 KRILL OIL PO, Probiotic Product (PROBIOTIC PO), Benefiber, methocarbamol, montelukast, and estradiol.  No orders of the  defined types were placed in this encounter.    Penni Homans, MD

## 2020-03-03 ENCOUNTER — Ambulatory Visit: Payer: Medicare Other | Attending: Internal Medicine

## 2020-03-03 ENCOUNTER — Other Ambulatory Visit (HOSPITAL_BASED_OUTPATIENT_CLINIC_OR_DEPARTMENT_OTHER): Payer: Self-pay | Admitting: Internal Medicine

## 2020-03-03 DIAGNOSIS — Z23 Encounter for immunization: Secondary | ICD-10-CM

## 2020-03-11 MED FILL — MODERNA COVID-19 VACCINE 10: 100 | 1 days supply | Qty: 0 | Fill #0

## 2020-03-14 ENCOUNTER — Other Ambulatory Visit: Payer: Self-pay | Admitting: Family Medicine

## 2020-03-31 ENCOUNTER — Encounter: Payer: Self-pay | Admitting: Family Medicine

## 2020-04-01 MED ORDER — METHOCARBAMOL 500 MG PO TABS: 500.0000 mg | ORAL_TABLET | Freq: Four times a day (QID) | ORAL | 0 refills | Status: DC | PRN

## 2020-04-16 DIAGNOSIS — H5203 Hypermetropia, bilateral: Secondary | ICD-10-CM | POA: Diagnosis not present

## 2020-04-16 DIAGNOSIS — H2513 Age-related nuclear cataract, bilateral: Secondary | ICD-10-CM | POA: Diagnosis not present

## 2020-04-16 DIAGNOSIS — H40013 Open angle with borderline findings, low risk, bilateral: Secondary | ICD-10-CM | POA: Diagnosis not present

## 2020-04-16 DIAGNOSIS — H524 Presbyopia: Secondary | ICD-10-CM | POA: Diagnosis not present

## 2020-04-16 DIAGNOSIS — H52223 Regular astigmatism, bilateral: Secondary | ICD-10-CM | POA: Diagnosis not present

## 2020-05-01 ENCOUNTER — Other Ambulatory Visit (HOSPITAL_BASED_OUTPATIENT_CLINIC_OR_DEPARTMENT_OTHER): Payer: Self-pay | Admitting: Family Medicine

## 2020-05-01 DIAGNOSIS — Z1231 Encounter for screening mammogram for malignant neoplasm of breast: Secondary | ICD-10-CM

## 2020-05-06 ENCOUNTER — Inpatient Hospital Stay (HOSPITAL_BASED_OUTPATIENT_CLINIC_OR_DEPARTMENT_OTHER): Admission: RE | Admit: 2020-05-06 | Payer: Medicare Other | Source: Ambulatory Visit

## 2020-05-06 ENCOUNTER — Other Ambulatory Visit: Payer: Self-pay | Admitting: Family Medicine

## 2020-05-12 ENCOUNTER — Other Ambulatory Visit: Payer: Self-pay

## 2020-05-12 ENCOUNTER — Ambulatory Visit (HOSPITAL_BASED_OUTPATIENT_CLINIC_OR_DEPARTMENT_OTHER)
Admission: RE | Admit: 2020-05-12 | Discharge: 2020-05-12 | Disposition: A | Discharge status: Home / Self Care | Payer: PPO | Source: Ambulatory Visit | Attending: Family Medicine | Admitting: Family Medicine

## 2020-05-12 DIAGNOSIS — Z1231 Encounter for screening mammogram for malignant neoplasm of breast: Secondary | ICD-10-CM | POA: Insufficient documentation

## 2020-05-24 IMAGING — MG DIGITAL SCREENING BILATERAL MAMMOGRAM WITH TOMO AND CAD
6 of 10 series · 6 of 30 positions shown · non-contrast
Comparison: Previous exam(s).

CLINICAL DATA: Screening.

EXAM:
DIGITAL SCREENING BILATERAL MAMMOGRAM WITH TOMO AND CAD

[L MLO synth-2D]
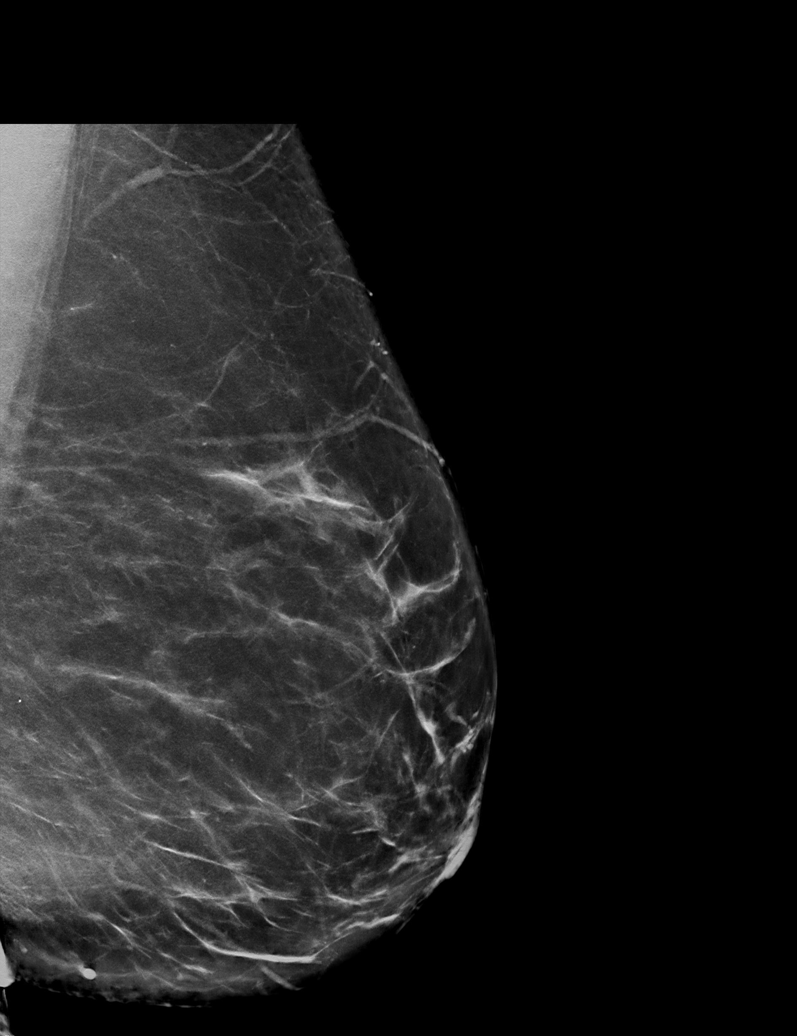

[R CV synth-2D]
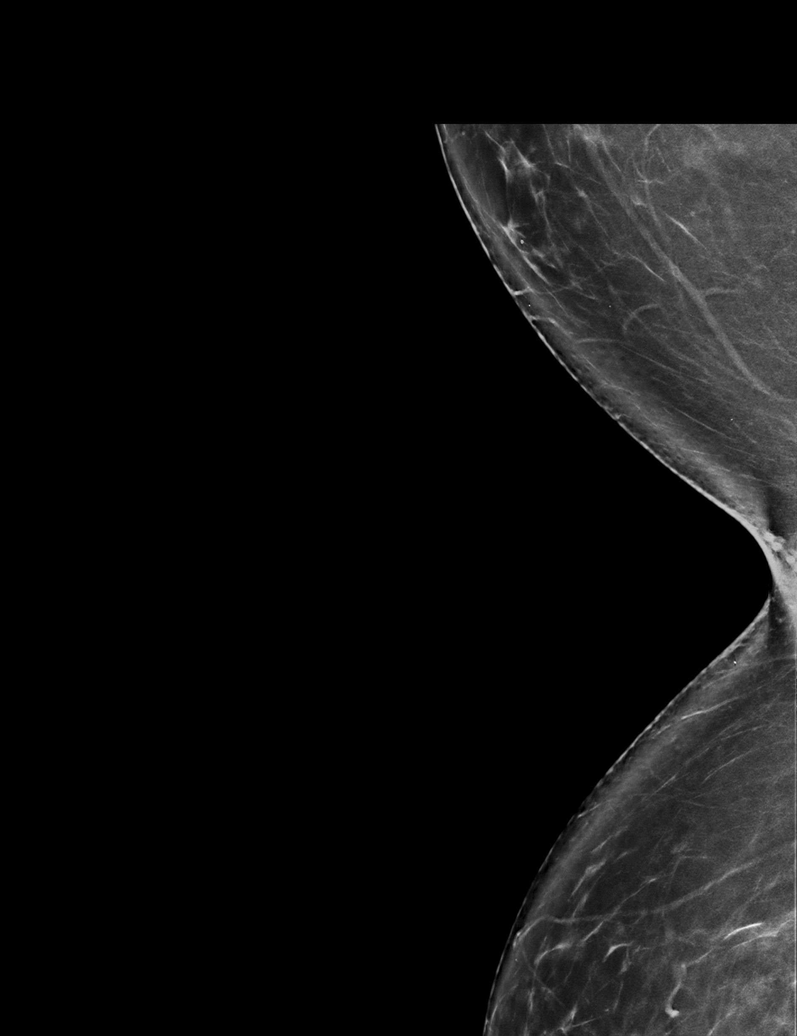

[R MLO synth-2D]
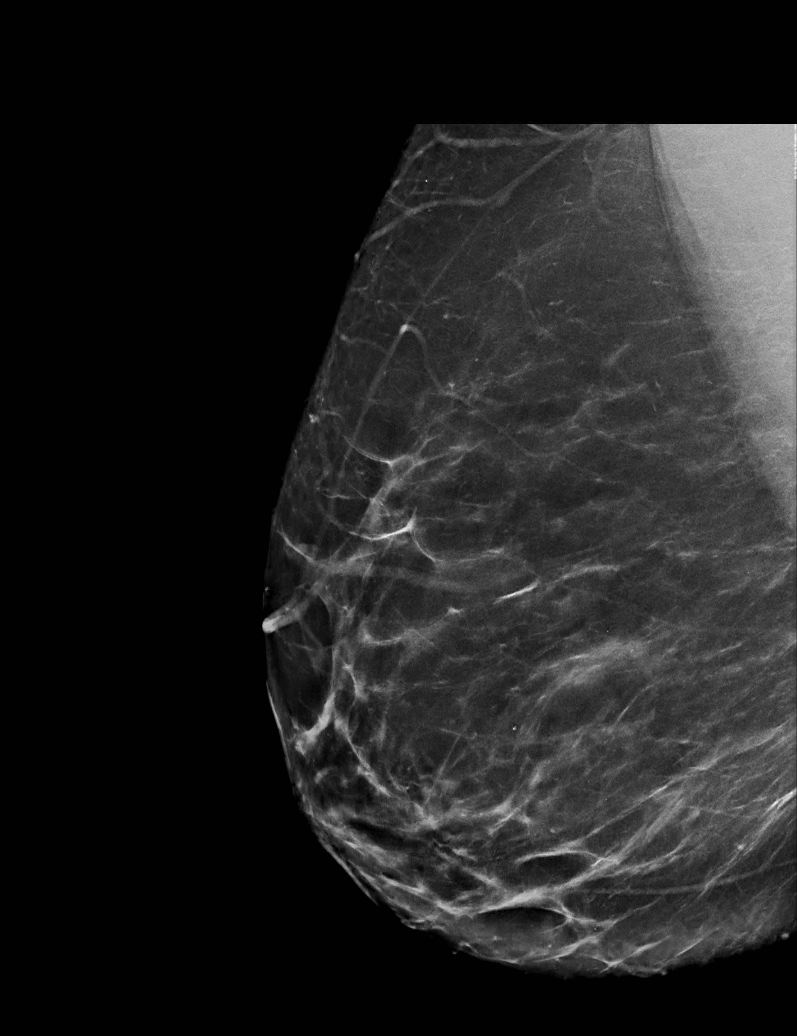

[R CC synth-2D]
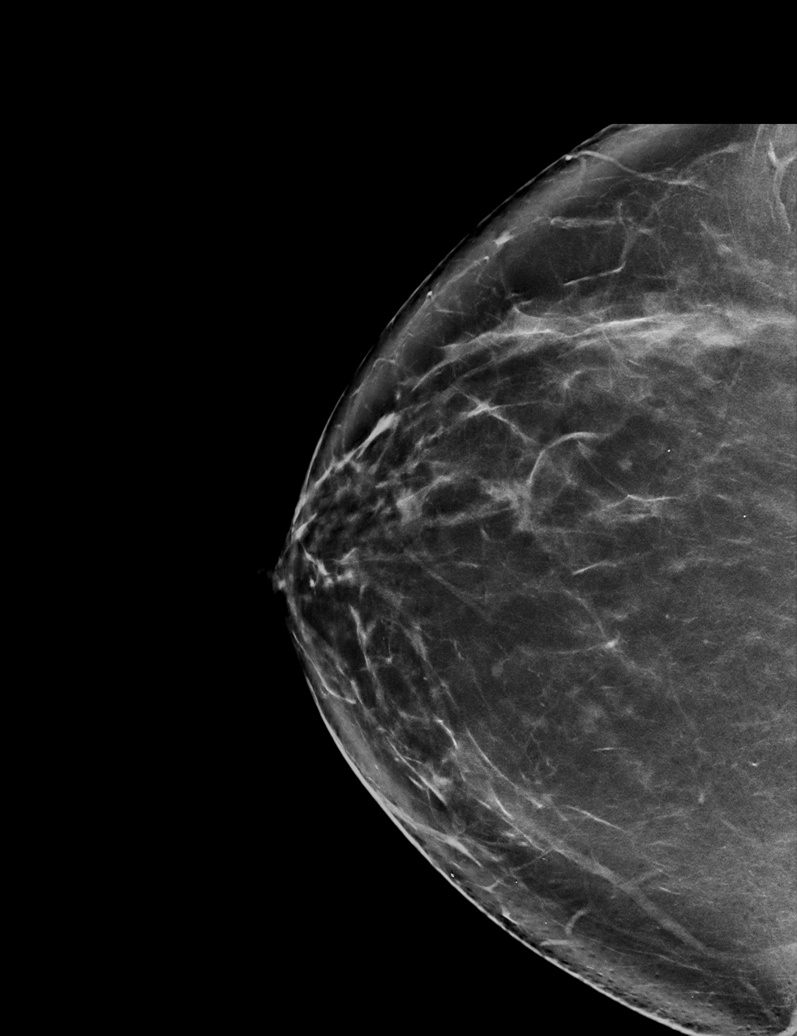

[L CC synth-2D]
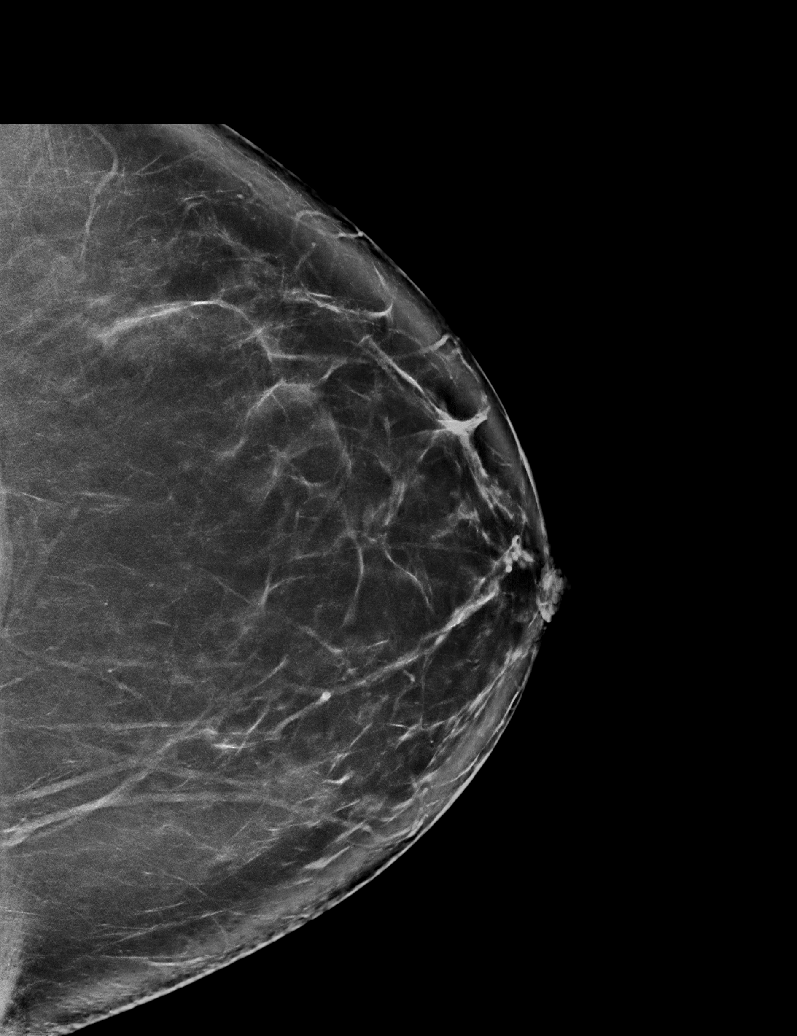

[L CC tomo · tomo slice 43/85.0]
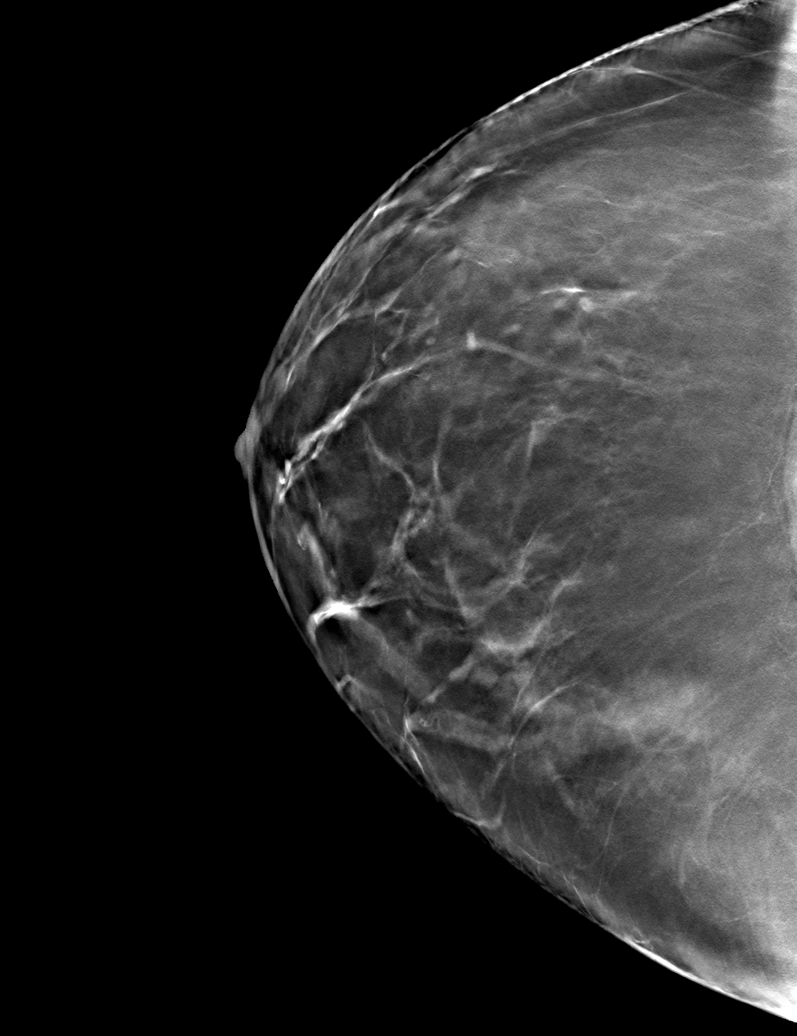

[6 of 30 positions shown; findings below may reference images not displayed]

ACR Breast Density Category b: There are scattered areas of
fibroglandular density.
FINDINGS: There are no findings suspicious for malignancy. Images were
processed with CAD.
IMPRESSION: No mammographic evidence of malignancy. A result letter of this
screening mammogram will be mailed directly to the patient.

RECOMMENDATION:
Screening mammogram in one year. (Code:[id number])

BI-RADS CATEGORY  1: Negative.

## 2020-08-14 DIAGNOSIS — H40013 Open angle with borderline findings, low risk, bilateral: Secondary | ICD-10-CM | POA: Diagnosis not present

## 2020-09-25 DIAGNOSIS — R200 Anesthesia of skin: Secondary | ICD-10-CM | POA: Diagnosis not present

## 2020-09-25 DIAGNOSIS — M5412 Radiculopathy, cervical region: Secondary | ICD-10-CM | POA: Diagnosis not present

## 2020-09-25 DIAGNOSIS — M47812 Spondylosis without myelopathy or radiculopathy, cervical region: Secondary | ICD-10-CM | POA: Diagnosis not present

## 2020-09-25 DIAGNOSIS — M542 Cervicalgia: Secondary | ICD-10-CM | POA: Diagnosis not present

## 2020-09-25 DIAGNOSIS — M5030 Other cervical disc degeneration, unspecified cervical region: Secondary | ICD-10-CM | POA: Diagnosis not present

## 2020-10-10 ENCOUNTER — Other Ambulatory Visit (HOSPITAL_BASED_OUTPATIENT_CLINIC_OR_DEPARTMENT_OTHER): Payer: Self-pay | Admitting: Neurosurgery

## 2020-10-10 DIAGNOSIS — M542 Cervicalgia: Secondary | ICD-10-CM

## 2020-10-11 ENCOUNTER — Ambulatory Visit (HOSPITAL_BASED_OUTPATIENT_CLINIC_OR_DEPARTMENT_OTHER)
Admission: RE | Admit: 2020-10-11 | Discharge: 2020-10-11 | Disposition: A | Discharge status: Home / Self Care | Payer: PPO | Source: Ambulatory Visit | Attending: Neurosurgery | Admitting: Neurosurgery

## 2020-10-11 ENCOUNTER — Other Ambulatory Visit: Payer: Self-pay

## 2020-10-11 DIAGNOSIS — M542 Cervicalgia: Secondary | ICD-10-CM | POA: Diagnosis not present

## 2020-10-11 DIAGNOSIS — M50223 Other cervical disc displacement at C6-C7 level: Secondary | ICD-10-CM | POA: Diagnosis not present

## 2020-10-11 DIAGNOSIS — M4802 Spinal stenosis, cervical region: Secondary | ICD-10-CM | POA: Diagnosis not present

## 2020-10-11 DIAGNOSIS — M25511 Pain in right shoulder: Secondary | ICD-10-CM | POA: Diagnosis not present

## 2020-10-11 DIAGNOSIS — M50221 Other cervical disc displacement at C4-C5 level: Secondary | ICD-10-CM | POA: Diagnosis not present

## 2020-10-24 DIAGNOSIS — M5412 Radiculopathy, cervical region: Secondary | ICD-10-CM | POA: Diagnosis not present

## 2020-11-05 ENCOUNTER — Other Ambulatory Visit: Payer: Self-pay

## 2020-11-05 ENCOUNTER — Telehealth: Payer: Self-pay | Admitting: Family Medicine

## 2020-11-05 DIAGNOSIS — R739 Hyperglycemia, unspecified: Secondary | ICD-10-CM

## 2020-11-05 DIAGNOSIS — I10 Essential (primary) hypertension: Secondary | ICD-10-CM

## 2020-11-05 DIAGNOSIS — E785 Hyperlipidemia, unspecified: Secondary | ICD-10-CM

## 2020-11-05 DIAGNOSIS — E559 Vitamin D deficiency, unspecified: Secondary | ICD-10-CM

## 2020-11-05 NOTE — Telephone Encounter (Signed)
Lab placed

## 2020-11-05 NOTE — Telephone Encounter (Signed)
Can we order labs for sept appt? Per CMA I made the appt the week before

## 2020-11-18 ENCOUNTER — Other Ambulatory Visit: Payer: Self-pay

## 2020-11-18 ENCOUNTER — Encounter: Payer: Self-pay | Admitting: Physical Therapy

## 2020-11-18 ENCOUNTER — Ambulatory Visit: Payer: PPO | Attending: Neurosurgery | Admitting: Physical Therapy

## 2020-11-18 DIAGNOSIS — R293 Abnormal posture: Secondary | ICD-10-CM | POA: Insufficient documentation

## 2020-11-18 DIAGNOSIS — R29898 Other symptoms and signs involving the musculoskeletal system: Secondary | ICD-10-CM | POA: Insufficient documentation

## 2020-11-18 DIAGNOSIS — M6281 Muscle weakness (generalized): Secondary | ICD-10-CM | POA: Diagnosis not present

## 2020-11-18 DIAGNOSIS — M5412 Radiculopathy, cervical region: Secondary | ICD-10-CM | POA: Diagnosis not present

## 2020-11-18 DIAGNOSIS — M542 Cervicalgia: Secondary | ICD-10-CM | POA: Insufficient documentation

## 2020-11-18 NOTE — Therapy (Signed)
[facility] [address]  [place], [place], [address] Phone: [phone]   Fax:  [phone]  Physical Therapy Evaluation  Patient Details  Name: BIRCHIE THUMM MRN: [id number] Date of Birth: Jan 05, 1961 Referring Provider (PT): Fenton Malling, NP   Encounter Date: 11/18/2020   PT End of Session - 11/18/20 1444     Visit Number 1    Number of Visits 16    Date for PT Re-Evaluation 01/13/21    Authorization Type [employer]    PT Start Time [id number]    PT Stop Time 1535    PT Time Calculation (min) 51 min    Activity Tolerance Patient tolerated treatment well    Behavior During Therapy [facility] for tasks assessed/performed             Past Medical History:  Diagnosis Date   Anginal pain ([place])    Arthritis    L shoulder- has had injections, degenerative changes in lumbar spine    Back pain    Chicken pox as a child   Constipation    Dizziness    Dysrhythmia 2013   SVT- treated by ablation by Dr. Lovena Le to f/u with as needed basis    Encounter for preventative adult health care exam with abnormal findings 02/09/2015   HTN (hypertension)    Hypokalemia 02/09/2015   Joint pain    Leg edema    Measles as a child   Measles as a child   Obesity    Panic attack    during episode of feeling to crowded    Preventative health care 02/09/2015   Seasonal allergies    SOB (shortness of breath) 09/23/11   "a little bit; at rest; before ablation", SOB again now (08/2014- due to lack  of exercise)    SVT (supraventricular tachycardia) ([place])    Uterine fibroid 12/17/2012   Cervical polyp per patient Follows with [facility], Dr Evette Cristal    Past Surgical History:  Procedure Laterality Date   ABDOMINAL HYSTERECTOMY  1990's   CARDIAC ELECTROPHYSIOLOGY STUDY AND ABLATION  09/23/11   MAXIMUM ACCESS (MAS)POSTERIOR LUMBAR INTERBODY FUSION (PLIF) 1 LEVEL N/A 09/24/2014   Procedure: L5-S1 MAS PLIF ;   Surgeon: Erline Levine, MD;  Location: [facility];  Service: Neurosurgery;  Laterality: N/A;  L5-S1 MAS PLIF fusion   SUPRAVENTRICULAR TACHYCARDIA ABLATION N/A 09/23/2011   Procedure: SUPRAVENTRICULAR TACHYCARDIA ABLATION;  Surgeon: Evans Lance, MD;  Location: [facility] CATH LAB;  Service: Cardiovascular;  Laterality: N/A;   TUBAL LIGATION  1980's    There were no vitals filed for this visit.    Subjective Assessment - 11/18/20 1447     Subjective Pt reports she started with pain in her R shoulder which she initially felt was RTC related but ortho felt it was more likely coming from her neck. Main issue now is numbness in her R hand. She does note LOM requiring her to turn her body rather than her neck when attempting to look to the side. In the past week she has started having headaches at the base of neck or L side of head. HA will sometimes trigger nausea.    Diagnostic tests 10/11/20 Cervical MRI 1. Moderate spinal canal stenosis at C6-7 with mild right neural  foraminal stenosis.  2. Mild spinal canal stenosis at C3-4, C4-5 and C5-6.  3. Severe right and moderate left neural foraminal stenosis at C3-4.    Patient  Stated Goals "to move my neck normally w/o pain and get rid the of the numbness in my hand"    Currently in Pain? Yes    Pain Score 5     Pain Location Neck    Pain Orientation Lower;Right;Left    Pain Descriptors / Indicators Dull;Throbbing    Pain Type Chronic pain    Pain Radiating Towards numbness and occasional pins & needles on radial side of hand    Pain Onset Other (comment)   Sept 2021   Pain Frequency Constant   initially intermittent but now more constant   Aggravating Factors  unpredictable    Pain Relieving Factors dependent dangling of R arm while opening.closing hand; Aspercreme helps with neck pain    Effect of Pain on Daily Activities difficulty holding things in her R hand; substitutes neck rotation with upper body rotation; has to use "bone" shaped pillow to  support her neck while sleeping on side                [facility] PT Assessment - 11/18/20 1444       Assessment   Medical Diagnosis Cervical radiculopathy    Referring Provider (PT) Fenton Malling, NP    Onset Date/Surgical Date --   Sept 2021   Hand Dominance Right    Next MD Visit 12/23/20    Prior Therapy PT for lumbar radiculopathy in 2017      Precautions   Precautions None      Restrictions   Weight Bearing Restrictions No      Balance Screen   Has the patient fallen in the past 6 months No    Has the patient had a decrease in activity level because of a fear of falling?  No    Is the patient reluctant to leave their home because of a fear of falling?  No      Home Ecologist residence    Living Arrangements Spouse/significant other;Children   & grandchildren     Prior Function   Level of Independence Independent    Vocation Retired    Leisure help care for grandkids; mostly sedentary      Cognition   Overall Cognitive Status Within Functional Limits for tasks assessed      Observation/Other Assessments   Focus on Therapeutic Outcomes (FOTO)  Neck = 46; predicted D/C FS = 59      Posture/Postural Control   Posture/Postural Control Postural limitations    Postural Limitations Forward head;Rounded Shoulders   mild     ROM / Strength   AROM / PROM / Strength AROM;Strength      AROM   Overall AROM Comments B shoulder FIR & FER WFL    AROM Assessment Site Cervical;Shoulder    Right/Left Shoulder Right;Left    Right Shoulder Flexion 134 Degrees    Right Shoulder ABduction 129 Degrees    Left Shoulder Flexion 141 Degrees    Left Shoulder ABduction 127 Degrees    Cervical Flexion 58    Cervical Extension 38    Cervical - Right Side Bend 24    Cervical - Left Side Bend 14    Cervical - Right Rotation 34    Cervical - Left Rotation 31      Palpation   Palpation comment increased muscle tension with slight TTP in B  cervicothoracic paraspinals, UT, LS, rhomboids & pecs      Special Tests    Special Tests Cervical  Cervical Tests Spurling's;Dictraction                        Objective measurements completed on examination: See above findings.       [facility] PT Treatment/Exercise - 11/18/20 1444       Exercises   Exercises Neck      Neck Exercises: Seated   Neck Retraction 10 reps;5 secs    Other Seated Exercise Scap retraction + depression 10 x 5"      Neck Exercises: Supine   Neck Retraction 10 reps;5 secs      Neck Exercises: Stretches   Upper Trapezius Stretch Right;Left;1 rep;30 seconds    Levator Stretch Right;Left;1 rep;30 seconds                    PT Education - 11/18/20 1535     Education Details PT eval findings, anticipated POC & initial HEP - Access Code: [email or web address]    Person(s) Educated Patient    Methods Explanation;Demonstration;Verbal cues;Tactile cues;Handout    Comprehension Verbalized understanding;Verbal cues required;Tactile cues required;Returned demonstration;Need further instruction              PT Short Term Goals - 11/18/20 1535       PT SHORT TERM GOAL #1   Title Patient will be independent with initial HEP    Status New    Target Date 12/16/20               PT Long Term Goals - 11/18/20 1535       PT LONG TERM GOAL #1   Title Patient will be independent with ongoing/advanced HEP for self-management at home in order to build upon functional gains in therapy    Status New    Target Date 01/13/21      PT LONG TERM GOAL #2   Title Improve posture and alignment with patient to demonstrate improved upright posture with posterior shoulder girdle engaged    Status New    Target Date 01/13/21      PT LONG TERM GOAL #3   Title Decrease neck pain by >/= 50-75% allowing patient increased ease of cervical motion    Status New    Target Date 01/13/21      PT LONG TERM GOAL #4   Title Patient to report  resolution of R hand numbness    Status New    Target Date 01/13/21      PT LONG TERM GOAL #5   Title Patient to report 50-75% reduction in frequency and intensity of headaches/migraines    Status New    Target Date 01/13/21      PT LONG TERM GOAL #6   Title Patient to improve cervical AROM to [facility] without pain provocation    Status New    Target Date 01/13/21      PT LONG TERM GOAL #7   Title Patient to report ability to perform ADLs, household, and leisure activities without limitation due to neck pain, LOM or UE weakness    Status New    Target Date 01/13/21                    Plan - 11/18/20 1535     Clinical Impression Statement Lawsyn is a 60 y/o female who presents to OP PT for cervical radiculopathy originating in Sept 2021 w/o known MOI. Pain initially was primarily in R shoulder but ortho MD gave her an injection  which helped as told her the remaining pain was more likely coming from her neck. Her biggest concern presently in the numbness in the radial aspect of her R hand but she also notes restricted cervical ROM and recent onset of headaches. Cervical MRI revealing multilevel DDD, spinal canal stenosis and neural foraminal stenosis to varying degrees from C3-4 to C6-7. Deficits include abnormal posture, limited and painful cervical AROM, limited B shoulder ROM in flexion and abduction, cervical hypomobility with increased muscle tension t/o cervical paraspinals, R>L UT, LS, and scalenes, and R UE radiculopathy. Benefit noted from manual cervical distraction. Instruction provided in initial HEP to address posture and muscle tension/tightness. In[place]ah will benefit from skilled PT to address above deficits to restore pain free functional cervical ROM and alleviate R UE radiculopathy to improve tolerance for normal daily activities.    Personal Factors and Comorbidities Time since onset of injury/illness/exacerbation;Past/Current Experience;Fitness;Age;Comorbidity 3+     Comorbidities HTN, SVT, R RTC tendinopathy, L5-S1 fusion 2016    Examination-Activity Limitations Caring for Others;Lift;Carry;Reach Overhead;Self Feeding    Examination-Participation Restrictions Cleaning;Community Activity;Driving;Interpersonal Relationship;Laundry;Meal Prep;Shop;Volunteer;Yard Work    Stability/Clinical Decision Making Stable/Uncomplicated    Designer, jewellery Low    Rehab Potential Good    PT Frequency 2x / week    PT Duration 8 weeks   6-8 weeks   PT Treatment/Interventions ADLs/Self Care Home Management;Cryotherapy;Electrical Stimulation;Iontophoresis '4mg'$ /ml Dexamethasone;Moist Heat;Traction;Ultrasound;Functional mobility training;Therapeutic activities;Therapeutic exercise;Neuromuscular re-education;Patient/family education;Manual techniques;Passive range of motion;Dry needling;Taping;Spinal Manipulations;Joint Manipulations    PT Next Visit Plan Assess B shoulder & grip strength (update goals if indicated); review initial HEP; cervical ROM; postural strengthening; manual therapy and modalities PRN    PT Home Exercise Plan Access Code: [email or web address] (8/23)    Consulted and Agree with Plan of Care Patient             Patient will benefit from skilled therapeutic intervention in order to improve the following deficits and impairments:  Decreased activity tolerance, Decreased knowledge of precautions, Decreased mobility, Decreased range of motion, Decreased strength, Hypomobility, Increased fascial restricitons, Increased muscle spasms, Impaired perceived functional ability, Impaired flexibility, Impaired UE functional use, Improper body mechanics, Postural dysfunction, Pain, Impaired sensation  Visit Diagnosis: Radiculopathy, cervical region  Cervicalgia  Abnormal posture  Muscle weakness (generalized)  Other symptoms and signs involving the musculoskeletal system     Problem List Patient Active Problem List   Diagnosis Date Noted   Chest pain 01/18/2019    Acute left-sided thoracic back pain 01/18/2019   Hyperglycemia 01/09/2018   Colon polyp 08/29/2017   Vitamin D deficiency 02/25/2017   Hypokalemia 02/09/2015   Encounter for preventative adult health care exam with abnormal findings 02/09/2015   Preventative health care 02/09/2015   Spondylolisthesis of lumbar region 09/24/2014   Neck pain 04/08/2014   Muscle cramps 01/13/2014   Tendinopathy of rotator cuff 11/28/2013   Essential hypertension, benign 11/28/2013   Urinary frequency 08/17/2013   Uterine fibroid 12/17/2012   Obesity    SVT (supraventricular tachycardia) ([place]) 09/21/2011   LOW BACK PAIN 01/04/2008   Hyperlipidemia 02/28/2007   Allergic rhinitis 02/28/2007   KNEE PAIN, LEFT 02/28/2007    Percival Spanish, PT, MPT 11/18/2020, 5:26 PM  [facility] [address]  [place], [place], [address] Phone: [phone]   Fax:  [phone]  Name: NOHELY GUIA MRN: [id number] Date of Birth: September 10, 1960

## 2020-11-18 NOTE — Patient Instructions (Signed)
     Access Code: [email or web address] URL: [email or web address] Date: 11/18/2020 Prepared by: Annie Paras  Exercises Seated Cervical Retraction - 2-3 x daily - 7 x weekly - 2 sets - 10 reps - 3-5 sec hold Supine Cervical Retraction with Towel - 2-3 x daily - 7 x weekly - 2 sets - 10 reps - 3-5 sec hold Seated Gentle Upper Trapezius Stretch - 2-3 x daily - 7 x weekly - 3 reps - 30 sec hold Gentle Levator Scapulae Stretch - 2-3 x daily - 7 x weekly - 3 reps - 30 sec hold Seated Scapular Retraction - 2-3 x daily - 7 x weekly - 2 sets - 10 reps - 5 sec hold

## 2020-11-19 ENCOUNTER — Other Ambulatory Visit (INDEPENDENT_AMBULATORY_CARE_PROVIDER_SITE_OTHER): Payer: PPO

## 2020-11-19 DIAGNOSIS — R739 Hyperglycemia, unspecified: Secondary | ICD-10-CM

## 2020-11-19 DIAGNOSIS — E559 Vitamin D deficiency, unspecified: Secondary | ICD-10-CM

## 2020-11-19 DIAGNOSIS — I10 Essential (primary) hypertension: Secondary | ICD-10-CM | POA: Diagnosis not present

## 2020-11-19 DIAGNOSIS — E785 Hyperlipidemia, unspecified: Secondary | ICD-10-CM

## 2020-11-19 LAB — COMPREHENSIVE METABOLIC PANEL
ALT: 13 U/L (ref 0–35)
AST: 14 U/L (ref 0–37)
Albumin: 3.9 g/dL (ref 3.5–5.2)
Alkaline Phosphatase: 50 U/L (ref 39–117)
BUN: 16 mg/dL (ref 6–23)
CO2: 25 mEq/L (ref 19–32)
Calcium: 10.3 mg/dL (ref 8.4–10.5)
Chloride: 105 mEq/L (ref 96–112)
Creatinine, Ser: 0.61 mg/dL (ref 0.40–1.20)
GFR: 97.19 mL/min (ref >60.00)
Glucose, Bld: 92 mg/dL (ref 70–99)
Potassium: 4 mEq/L (ref 3.5–5.1)
Sodium: 139 mEq/L (ref 135–145)
Total Bilirubin: 1 mg/dL (ref 0.2–1.2)
Total Protein: 6.8 g/dL (ref 6.0–8.3)

## 2020-11-19 LAB — CBC WITH DIFFERENTIAL/PLATELET
Basophils Absolute: 0 10*3/uL (ref 0.0–0.1)
Basophils Relative: 0.8 % (ref 0.0–3.0)
Eosinophils Absolute: 0.2 10*3/uL (ref 0.0–0.7)
Eosinophils Relative: 3.5 % (ref 0.0–5.0)
HCT: 40.5 % (ref 36.0–46.0)
Hemoglobin: 13.2 g/dL (ref 12.0–15.0)
Lymphocytes Relative: 44.1 % (ref 12.0–46.0)
Lymphs Abs: 2.4 10*3/uL (ref 0.7–4.0)
MCHC: 32.6 g/dL (ref 30.0–36.0)
MCV: 89.8 fl (ref 78.0–100.0)
Monocytes Absolute: 0.6 10*3/uL (ref 0.1–1.0)
Monocytes Relative: 10.3 % (ref 3.0–12.0)
Neutro Abs: 2.3 10*3/uL (ref 1.4–7.7)
Neutrophils Relative %: 41.3 % — ABNORMAL LOW (ref 43.0–77.0)
Platelets: 219 10*3/uL (ref 150.0–400.0)
RBC: 4.51 Mil/uL (ref 3.87–5.11)
RDW: 15.7 % — ABNORMAL HIGH (ref 11.5–15.5)
WBC: 5.5 10*3/uL (ref 4.0–10.5)

## 2020-11-19 LAB — LIPID PANEL
Cholesterol: 160 mg/dL (ref 0–200)
HDL: 53.5 mg/dL (ref >39.00)
LDL Cholesterol: 82 mg/dL (ref 0–99)
NonHDL: 106.08
Total CHOL/HDL Ratio: 3
Triglycerides: 121 mg/dL (ref 0.0–149.0)
VLDL: 24.2 mg/dL (ref 0.0–40.0)

## 2020-11-19 LAB — HEMOGLOBIN A1C: Hgb A1c MFr Bld: 5.4 % (ref 4.6–6.5)

## 2020-11-19 LAB — TSH: TSH: 1.39 u[IU]/mL (ref 0.35–5.50)

## 2020-11-19 LAB — VITAMIN D 25 HYDROXY (VIT D DEFICIENCY, FRACTURES): VITD: 42.73 ng/mL (ref 30.00–100.00)

## 2020-11-24 ENCOUNTER — Other Ambulatory Visit: Payer: Self-pay

## 2020-11-24 ENCOUNTER — Encounter: Payer: Self-pay | Admitting: Physical Therapy

## 2020-11-24 ENCOUNTER — Ambulatory Visit: Payer: PPO | Admitting: Physical Therapy

## 2020-11-24 DIAGNOSIS — M6281 Muscle weakness (generalized): Secondary | ICD-10-CM

## 2020-11-24 DIAGNOSIS — R29898 Other symptoms and signs involving the musculoskeletal system: Secondary | ICD-10-CM

## 2020-11-24 DIAGNOSIS — M5412 Radiculopathy, cervical region: Secondary | ICD-10-CM

## 2020-11-24 DIAGNOSIS — R293 Abnormal posture: Secondary | ICD-10-CM

## 2020-11-24 DIAGNOSIS — M542 Cervicalgia: Secondary | ICD-10-CM

## 2020-11-24 NOTE — Patient Instructions (Signed)
     Access Code: [email or web address] URL: [email or web address] Date: 11/24/2020 Prepared by: Annie Paras  Exercises Seated Cervical Retraction - 2-3 x daily - 7 x weekly - 2 sets - 10 reps - 3-5 sec hold Supine Cervical Retraction with Towel - 2-3 x daily - 7 x weekly - 2 sets - 10 reps - 3-5 sec hold Seated Gentle Upper Trapezius Stretch - 2-3 x daily - 7 x weekly - 3 reps - 30 sec hold Gentle Levator Scapulae Stretch - 2-3 x daily - 7 x weekly - 3 reps - 30 sec hold Seated Scapular Retraction - 2-3 x daily - 7 x weekly - 2 sets - 10 reps - 5 sec hold Supine Shoulder Horizontal Abduction with Resistance - 1 x daily - 7 x weekly - 2 sets - 10 reps - 3 sec hold Supine Shoulder External Rotation on Foam Roll with Theraband - 1 x daily - 7 x weekly - 2 sets - 10 reps - 3 sec hold Standing Bilateral Low Shoulder Row with Anchored Resistance - 1 x daily - 7 x weekly - 2 sets - 10 reps - 5 sec hold Scapular Retraction with Resistance Advanced - 1 x daily - 7 x weekly - 2 sets - 10 reps - 5 sec hold

## 2020-11-24 NOTE — Therapy (Signed)
[facility] [address]  [place], [place], [address] Phone: [phone]   Fax:  [phone]  Physical Therapy Treatment  Patient Details  Name: Sherri Sullivan MRN: [id number] Date of Birth: Jan 08, 1961 Referring Provider (PT): Fenton Malling, NP   Encounter Date: 11/24/2020   PT End of Session - 11/24/20 1405     Visit Number 2    Number of Visits 16    Date for PT Re-Evaluation 01/13/21    Authorization Type [employer]    PT Start Time 1405    PT Stop Time [id number]    PT Time Calculation (min) 48 min    Activity Tolerance Patient tolerated treatment well    Behavior During Therapy [facility] for tasks assessed/performed             Past Medical History:  Diagnosis Date   Anginal pain ([place])    Arthritis    L shoulder- has had injections, degenerative changes in lumbar spine    Back pain    Chicken pox as a child   Constipation    Dizziness    Dysrhythmia 2013   SVT- treated by ablation by Dr. Lovena Le to f/u with as needed basis    Encounter for preventative adult health care exam with abnormal findings 02/09/2015   HTN (hypertension)    Hypokalemia 02/09/2015   Joint pain    Leg edema    Measles as a child   Measles as a child   Obesity    Panic attack    during episode of feeling to crowded    Preventative health care 02/09/2015   Seasonal allergies    SOB (shortness of breath) 09/23/11   "a little bit; at rest; before ablation", SOB again now (08/2014- due to lack  of exercise)    SVT (supraventricular tachycardia) ([place])    Uterine fibroid 12/17/2012   Cervical polyp per patient Follows with [facility], Dr Evette Cristal    Past Surgical History:  Procedure Laterality Date   ABDOMINAL HYSTERECTOMY  1990's   CARDIAC ELECTROPHYSIOLOGY STUDY AND ABLATION  09/23/11   MAXIMUM ACCESS (MAS)POSTERIOR LUMBAR INTERBODY FUSION (PLIF) 1 LEVEL N/A 09/24/2014   Procedure: L5-S1 MAS PLIF ;   Surgeon: Erline Levine, MD;  Location: [place] NEURO ORS;  Service: Neurosurgery;  Laterality: N/A;  L5-S1 MAS PLIF fusion   SUPRAVENTRICULAR TACHYCARDIA ABLATION N/A 09/23/2011   Procedure: SUPRAVENTRICULAR TACHYCARDIA ABLATION;  Surgeon: Evans Lance, MD;  Location: [facility] CATH LAB;  Service: Cardiovascular;  Laterality: N/A;   TUBAL LIGATION  1980's    There were no vitals filed for this visit.   Subjective Assessment - 11/24/20 1407     Subjective Pt denies any issues with initial HEP stating exercises went well other than slight pain with LS stretch and unsure if performing supine chin tuck correctly.    Diagnostic tests 10/11/20 Cervical MRI 1. Moderate spinal canal stenosis at C6-7 with mild right neural  foraminal stenosis.  2. Mild spinal canal stenosis at C3-4, C4-5 and C5-6.  3. Severe right and moderate left neural foraminal stenosis at C3-4.    Patient Stated Goals "to move my neck normally w/o pain and get rid the of the numbness in my hand"    Currently in Pain? Yes    Pain Score 3     Pain Location Shoulder    Pain Orientation Left;Upper   near neck   Pain Descriptors / Indicators Dull;Throbbing  Pain Type Chronic pain    Pain Frequency Constant                [facility] PT Assessment - 11/24/20 1405       Strength   Right Shoulder Flexion 4-/5    Right Shoulder ABduction 4/5    Right Shoulder Internal Rotation 4-/5    Right Shoulder External Rotation 3+/5    Left Shoulder Flexion 4-/5    Left Shoulder ABduction 4/5    Left Shoulder Internal Rotation 4-/5    Left Shoulder External Rotation 3+/5    Right Hand Grip (lbs) 15.33   20, 16, 10   Right Hand Lateral Pinch 6.33 lbs   6, 6, 7   Right Hand 3 Point Pinch 7.67 lbs   8, 8, 7   Left Hand Grip (lbs) 5   9, 4, 2   Left Hand Lateral Pinch 4.67 lbs   5, 5, 4   Left Hand 3 Point Pinch 5 lbs   5, 5, 5                          [facility] Adult PT Treatment/Exercise - 11/24/20 1405       Exercises    Exercises Neck      Neck Exercises: Machines for Strengthening   UBE (Upper Arm Bike) L1.0 x 6 min (3' fwd/3' back)      Neck Exercises: Theraband   Scapula Retraction 10 reps   yellow TB   Scapula Retraction Limitations standing + shoulder extension to neutral with band anchored in door    Rows 10 reps   yellow TB   Rows Limitations standing with band anchored in door    Shoulder External Rotation 10 reps   yellow TB   Shoulder External Rotation Limitations hooklying on towel roll    Horizontal ABduction 10 reps   yellow TB   Horizontal ABduction Limitations hooklying on towel roll    Other Theraband Exercises Alt yellow TB UE diagonals 10 x 3", hooklying on towel roll      Neck Exercises: Seated   Neck Retraction 10 reps;5 secs    Other Seated Exercise Scap retraction + depression 10 x 5"      Neck Exercises: Supine   Neck Retraction 10 reps;5 secs    Neck Retraction Limitations pt noting better awareness in sitting      Neck Exercises: Stretches   Upper Trapezius Stretch Right;Left;1 rep;30 seconds    Upper Trapezius Stretch Limitations pt noting catching sensation on R    Levator Stretch Right;Left;1 rep;30 seconds                    PT Education - 11/24/20 1450     Education Details HEP update - postural/scapular strengthening - Access Code: [email or web address]    Person(s) Educated Patient    Methods Explanation;Demonstration;Verbal cues;Handout    Comprehension Verbalized understanding;Verbal cues required;Returned demonstration;Need further instruction              PT Short Term Goals - 11/24/20 1405       PT SHORT TERM GOAL #1   Title Patient will be independent with initial HEP    Status On-going    Target Date 12/16/20               PT Long Term Goals - 11/24/20 1409       PT LONG TERM GOAL #1   Title Patient will  be independent with ongoing/advanced HEP for self-management at home in order to build upon functional gains in therapy    Status  On-going    Target Date 01/13/21      PT LONG TERM GOAL #2   Title Improve posture and alignment with patient to demonstrate improved upright posture with posterior shoulder girdle engaged    Status On-going    Target Date 01/13/21      PT LONG TERM GOAL #3   Title Decrease neck pain by >/= 50-75% allowing patient increased ease of cervical motion    Status On-going    Target Date 01/13/21      PT LONG TERM GOAL #4   Title Patient to report resolution of R hand numbness    Status On-going    Target Date 01/13/21      PT LONG TERM GOAL #5   Title Patient to report 50-75% reduction in frequency and intensity of headaches/migraines    Status On-going    Target Date 01/13/21      PT LONG TERM GOAL #6   Title Patient to improve cervical AROM to [facility] without pain provocation    Status On-going    Target Date 01/13/21      PT LONG TERM GOAL #7   Title Patient to report ability to perform ADLs, household, and leisure activities without limitation due to neck pain, LOM or UE weakness    Status On-going    Target Date 01/13/21      PT LONG TERM GOAL #8   Title Patient will demonstrate improved B shoulder strength to >/= 4+/5 for functional UE use    Status New    Target Date 01/13/21      PT LONG TERM GOAL  #9   TITLE Patient will demonstrate improved B grip strength by >/= 10# for improved functional use of hands    Status New    Target Date 01/13/21                   Plan - 11/24/20 1411     Clinical Impression Statement Vincie reports good compliance with HEP but noting some increased pain with LS stretch and unsure if she is performing supine cervical retraction correctly.  HEP reviewed clarifying desired motion/muscle activation for supine cervical retraction. Unable to reproduce pain with LS stretch during review but pt noting catching sensation with R UT stretch - better if she turned her head slightly toward the ceiling. Strength assessment revealing both proximal  and distal UE weakness with significantly decreased grip strength L>R. Progressed postural strengthening with addition of yellow TB to scapular strengthening both in hooklying and upright with good tolerance - HEP updated accordingly. Will introduce grip strengthening on next visit.    Comorbidities HTN, SVT, R RTC tendinopathy, L5-S1 fusion 2016    Rehab Potential Good    PT Frequency 2x / week    PT Duration 8 weeks   6-8 weeks   PT Treatment/Interventions ADLs/Self Care Home Management;Cryotherapy;Electrical Stimulation;Iontophoresis '4mg'$ /ml Dexamethasone;Moist Heat;Traction;Ultrasound;Functional mobility training;Therapeutic activities;Therapeutic exercise;Neuromuscular re-education;Patient/family education;Manual techniques;Passive range of motion;Dry needling;Taping;Spinal Manipulations;Joint Manipulations    PT Next Visit Plan cervical ROM; postural strengthening; initiate grip strengthening & shoulder strengthening; manual therapy and modalities PRN    PT Home Exercise Plan Access Code: [email or web address] (8/23, updated 8/29)    Consulted and Agree with Plan of Care Patient             Patient will benefit from skilled therapeutic intervention in order  to improve the following deficits and impairments:  Decreased activity tolerance, Decreased knowledge of precautions, Decreased mobility, Decreased range of motion, Decreased strength, Hypomobility, Increased fascial restricitons, Increased muscle spasms, Impaired perceived functional ability, Impaired flexibility, Impaired UE functional use, Improper body mechanics, Postural dysfunction, Pain, Impaired sensation  Visit Diagnosis: Radiculopathy, cervical region  Cervicalgia  Abnormal posture  Muscle weakness (generalized)  Other symptoms and signs involving the musculoskeletal system     Problem List Patient Active Problem List   Diagnosis Date Noted   Chest pain 01/18/2019   Acute left-sided thoracic back pain 01/18/2019    Hyperglycemia 01/09/2018   Colon polyp 08/29/2017   Vitamin D deficiency 02/25/2017   Hypokalemia 02/09/2015   Encounter for preventative adult health care exam with abnormal findings 02/09/2015   Preventative health care 02/09/2015   Spondylolisthesis of lumbar region 09/24/2014   Neck pain 04/08/2014   Muscle cramps 01/13/2014   Tendinopathy of rotator cuff 11/28/2013   Essential hypertension, benign 11/28/2013   Urinary frequency 08/17/2013   Uterine fibroid 12/17/2012   Obesity    SVT (supraventricular tachycardia) ([place]) 09/21/2011   LOW BACK PAIN 01/04/2008   Hyperlipidemia 02/28/2007   Allergic rhinitis 02/28/2007   KNEE PAIN, LEFT 02/28/2007    Percival Spanish, PT, MPT 11/24/2020, 3:17 PM  [facility] [address]  [place], [place], [address] Phone: [phone]   Fax:  [phone]  Name: Sherri Sullivan MRN: [id number] Date of Birth: October 13, 1960

## 2020-11-26 ENCOUNTER — Ambulatory Visit: Payer: PPO

## 2020-11-26 ENCOUNTER — Other Ambulatory Visit: Payer: Self-pay

## 2020-11-26 DIAGNOSIS — M542 Cervicalgia: Secondary | ICD-10-CM

## 2020-11-26 DIAGNOSIS — M5412 Radiculopathy, cervical region: Secondary | ICD-10-CM | POA: Diagnosis not present

## 2020-11-26 DIAGNOSIS — R29898 Other symptoms and signs involving the musculoskeletal system: Secondary | ICD-10-CM

## 2020-11-26 DIAGNOSIS — M6281 Muscle weakness (generalized): Secondary | ICD-10-CM

## 2020-11-26 DIAGNOSIS — R293 Abnormal posture: Secondary | ICD-10-CM

## 2020-11-26 NOTE — Therapy (Signed)
[facility] [address]  [place], [place], [address] Phone: [phone]   Fax:  [phone]  Physical Therapy Treatment  Patient Details  Name: Sherri Sullivan MRN: [id number] Date of Birth: 02/08/1961 Referring Provider (PT): Fenton Malling, NP   Encounter Date: 11/26/2020   PT End of Session - 11/26/20 1530     Visit Number 3    Number of Visits 16    Date for PT Re-Evaluation 01/13/21    Authorization Type [employer]    PT Start Time [id number]    PT Stop Time [id number]    PT Time Calculation (min) 40 min    Activity Tolerance Patient tolerated treatment well    Behavior During Therapy [facility] for tasks assessed/performed             Past Medical History:  Diagnosis Date   Anginal pain ([place])    Arthritis    L shoulder- has had injections, degenerative changes in lumbar spine    Back pain    Chicken pox as a child   Constipation    Dizziness    Dysrhythmia 2013   SVT- treated by ablation by Dr. Lovena Le to f/u with as needed basis    Encounter for preventative adult health care exam with abnormal findings 02/09/2015   HTN (hypertension)    Hypokalemia 02/09/2015   Joint pain    Leg edema    Measles as a child   Measles as a child   Obesity    Panic attack    during episode of feeling to crowded    Preventative health care 02/09/2015   Seasonal allergies    SOB (shortness of breath) 09/23/11   "a little bit; at rest; before ablation", SOB again now (08/2014- due to lack  of exercise)    SVT (supraventricular tachycardia) ([place])    Uterine fibroid 12/17/2012   Cervical polyp per patient Follows with [facility], Dr Evette Cristal    Past Surgical History:  Procedure Laterality Date   ABDOMINAL HYSTERECTOMY  1990's   CARDIAC ELECTROPHYSIOLOGY STUDY AND ABLATION  09/23/11   MAXIMUM ACCESS (MAS)POSTERIOR LUMBAR INTERBODY FUSION (PLIF) 1 LEVEL N/A 09/24/2014   Procedure: L5-S1 MAS PLIF ;   Surgeon: Erline Levine, MD;  Location: [place] NEURO ORS;  Service: Neurosurgery;  Laterality: N/A;  L5-S1 MAS PLIF fusion   SUPRAVENTRICULAR TACHYCARDIA ABLATION N/A 09/23/2011   Procedure: SUPRAVENTRICULAR TACHYCARDIA ABLATION;  Surgeon: Evans Lance, MD;  Location: [facility] CATH LAB;  Service: Cardiovascular;  Laterality: N/A;   TUBAL LIGATION  1980's    There were no vitals filed for this visit.   Subjective Assessment - 11/26/20 1451     Subjective Pt reports that she has been doing good, reports that she has discontinued chin tuck d/t popping/cracking in her neck.    Diagnostic tests 10/11/20 Cervical MRI 1. Moderate spinal canal stenosis at C6-7 with mild right neural  foraminal stenosis.  2. Mild spinal canal stenosis at C3-4, C4-5 and C5-6.  3. Severe right and moderate left neural foraminal stenosis at C3-4.    Patient Stated Goals "to move my neck normally w/o pain and get rid the of the numbness in my hand"    Currently in Pain? Yes    Pain Score 3     Pain Location Shoulder    Pain Orientation Left;Upper    Pain Descriptors / Indicators Dull;Throbbing    Pain Type Chronic pain  [facility] Adult PT Treatment/Exercise - 11/26/20 0001       Exercises   Exercises Neck;Hand      Neck Exercises: Machines for Strengthening   UBE (Upper Arm Bike) L1.0 x 6 min (3' fwd/3' back)      Neck Exercises: Theraband   Shoulder External Rotation 10 reps    Shoulder External Rotation Limitations yellow TB; seated    Horizontal ABduction 10 reps    Horizontal ABduction Limitations standing, back against doorframe      Neck Exercises: Standing   Wall Push Ups 20 reps    Wall Push Ups Limitations 2x10; push up with a plus      Neck Exercises: Seated   Cervical Isometrics Extension;10 reps;3 secs    Cervical Isometrics Limitations with towel    Cervical Rotation Both;10 reps    Cervical Rotation Limitations SNAGs    Other Seated Exercise cervical  extension with neck supported using towel 10 reps      Neck Exercises: Stretches   Upper Trapezius Stretch Right;Left;2 reps;30 seconds      Hand Exercises   Other Hand Exercises grip squeezes with red theraputty 10 reps B                    PT Education - 11/26/20 1530     Education Details HEP update:    Person(s) Educated Patient    Methods Explanation;Demonstration;Handout    Comprehension Verbalized understanding;Returned demonstration              PT Short Term Goals - 11/24/20 1405       PT SHORT TERM GOAL #1   Title Patient will be independent with initial HEP    Status On-going    Target Date 12/16/20               PT Long Term Goals - 11/26/20 1635       PT LONG TERM GOAL #1   Title Patient will be independent with ongoing/advanced HEP for self-management at home in order to build upon functional gains in therapy    Status On-going      PT LONG TERM GOAL #2   Title Improve posture and alignment with patient to demonstrate improved upright posture with posterior shoulder girdle engaged    Status On-going      PT LONG TERM GOAL #3   Title Decrease neck pain by >/= 50-75% allowing patient increased ease of cervical motion    Status On-going      PT LONG TERM GOAL #4   Title Patient to report resolution of R hand numbness    Status On-going      PT LONG TERM GOAL #5   Title Patient to report 50-75% reduction in frequency and intensity of headaches/migraines    Status On-going      PT LONG TERM GOAL #6   Title Patient to improve cervical AROM to [place] without pain provocation    Status On-going      PT LONG TERM GOAL #7   Title Patient to report ability to perform ADLs, household, and leisure activities without limitation due to neck pain, LOM or UE weakness    Status On-going      PT LONG TERM GOAL #8   Title Patient will demonstrate improved B shoulder strength to >/= 4+/5 for functional UE use    Status On-going      PT LONG  TERM GOAL  #9   TITLE Patient will demonstrate improved  B grip strength by >/= 10# for improved functional use of hands    Status On-going                   Plan - 11/26/20 1531     Clinical Impression Statement Pt reported decreased tolerance to supine exercises so, I added standing versions with making notes to keep back against doorframe to engage persicap muscles. Introduced SNAGs with cervical rotation and light supported neck exercises. She had a good tolerance the new exercises today, she did demonstrate increase R UE weakness with B resisted ER with yellow TB. Initiated grip strength with red threaputty and administered for at home. Updated HEP accordingly, pt responded well.    Personal Factors and Comorbidities Time since onset of injury/illness/exacerbation;Past/Current Experience;Fitness;Age;Comorbidity 3+    Comorbidities HTN, SVT, R RTC tendinopathy, L5-S1 fusion 2016    PT Frequency 2x / week    PT Duration 8 weeks    PT Treatment/Interventions ADLs/Self Care Home Management;Cryotherapy;Electrical Stimulation;Iontophoresis '4mg'$ /ml Dexamethasone;Moist Heat;Traction;Ultrasound;Functional mobility training;Therapeutic activities;Therapeutic exercise;Neuromuscular re-education;Patient/family education;Manual techniques;Passive range of motion;Dry needling;Taping;Spinal Manipulations;Joint Manipulations    PT Next Visit Plan cervical ROM; postural strengthening; initiate grip strengthening & shoulder strengthening; manual therapy and modalities PRN    PT Home Exercise Plan Access Code: [email or web address] (8/23, updated 8/29)    Consulted and Agree with Plan of Care Patient             Patient will benefit from skilled therapeutic intervention in order to improve the following deficits and impairments:  Decreased activity tolerance, Decreased knowledge of precautions, Decreased mobility, Decreased range of motion, Decreased strength, Hypomobility, Increased fascial restricitons,  Increased muscle spasms, Impaired perceived functional ability, Impaired flexibility, Impaired UE functional use, Improper body mechanics, Postural dysfunction, Pain, Impaired sensation  Visit Diagnosis: Radiculopathy, cervical region  Cervicalgia  Abnormal posture  Muscle weakness (generalized)  Other symptoms and signs involving the musculoskeletal system     Problem List Patient Active Problem List   Diagnosis Date Noted   Chest pain 01/18/2019   Acute left-sided thoracic back pain 01/18/2019   Hyperglycemia 01/09/2018   Colon polyp 08/29/2017   Vitamin D deficiency 02/25/2017   Hypokalemia 02/09/2015   Encounter for preventative adult health care exam with abnormal findings 02/09/2015   Preventative health care 02/09/2015   Spondylolisthesis of lumbar region 09/24/2014   Neck pain 04/08/2014   Muscle cramps 01/13/2014   Tendinopathy of rotator cuff 11/28/2013   Essential hypertension, benign 11/28/2013   Urinary frequency 08/17/2013   Uterine fibroid 12/17/2012   Obesity    SVT (supraventricular tachycardia) ([place]) 09/21/2011   LOW BACK PAIN 01/04/2008   Hyperlipidemia 02/28/2007   Allergic rhinitis 02/28/2007   KNEE PAIN, LEFT 02/28/2007    Artist Pais, PTA 11/26/2020, 4:35 PM  [facility] [address]  [place], [place], [address] Phone: [phone]   Fax:  [phone]  Name: Sherri Sullivan MRN: [id number] Date of Birth: 1960/04/30

## 2020-11-27 ENCOUNTER — Encounter: Payer: Self-pay | Admitting: Family Medicine

## 2020-11-27 ENCOUNTER — Ambulatory Visit (INDEPENDENT_AMBULATORY_CARE_PROVIDER_SITE_OTHER): Payer: PPO | Admitting: Family Medicine

## 2020-11-27 VITALS — BP 120/72 | HR 80 | Temp 97.7°F | Resp 16 | Ht 63.0 in | Wt 224.0 lb

## 2020-11-27 DIAGNOSIS — R0600 Dyspnea, unspecified: Secondary | ICD-10-CM

## 2020-11-27 DIAGNOSIS — E559 Vitamin D deficiency, unspecified: Secondary | ICD-10-CM

## 2020-11-27 DIAGNOSIS — M5030 Other cervical disc degeneration, unspecified cervical region: Secondary | ICD-10-CM | POA: Diagnosis not present

## 2020-11-27 DIAGNOSIS — I10 Essential (primary) hypertension: Secondary | ICD-10-CM

## 2020-11-27 DIAGNOSIS — R739 Hyperglycemia, unspecified: Secondary | ICD-10-CM

## 2020-11-27 DIAGNOSIS — Z23 Encounter for immunization: Secondary | ICD-10-CM

## 2020-11-27 DIAGNOSIS — Z0000 Encounter for general adult medical examination without abnormal findings: Secondary | ICD-10-CM | POA: Diagnosis not present

## 2020-11-27 NOTE — Assessment & Plan Note (Signed)
Notes some increase especially with exertion. No significant EKG changes but some nonspecific T wave abnormalities. Will proceed with echo and consider cardiology consultation if any abnormalities are found or patient's symptoms progress

## 2020-11-27 NOTE — Assessment & Plan Note (Signed)
Well controlled no changes to meds. Encouraged heart healthy diet such as the DASH diet and exercise as tolerated.

## 2020-11-27 NOTE — Progress Notes (Signed)
Patient ID: Sherri Sullivan, female    DOB: Dec 27, 1960  Age: 60 y.o. MRN: [id number]    Subjective:  No chief complaint on file.  Subjective   HPI Sherri Sullivan presents for office visit today for follow up on HTN and Welcome to [facility] Visit. She had a nurse visit and was suggested to get her Tdap vaccination due to newborn grandson. Denies CP/palp/HA/congestion/fevers/GI or GU c/o. Taking meds as prescribed.  She is going to PT now for her DDD in office. So far, she has had 3 visits and reports that it has helped slightly but it is still too early to judge. She is still struggling with numbness of her right thumb and 1st-3rd right fingers and weakness of left hand. She endorses keeping up with taking vitamin D supplements.  She sees Dr. Nori Riis for Pap smears.  Review of Systems  Constitutional:  Negative for chills, fatigue and fever.  HENT:  Negative for congestion, rhinorrhea, sinus pressure, sinus pain and sore throat.   Eyes:  Negative for pain.  Respiratory:  Positive for shortness of breath. Negative for cough.   Cardiovascular:  Negative for chest pain, palpitations and leg swelling.  Gastrointestinal:  Negative for abdominal pain, blood in stool, diarrhea, nausea and vomiting.  Genitourinary:  Negative for decreased urine volume, flank pain, frequency, vaginal bleeding and vaginal discharge.  Musculoskeletal:  Positive for neck pain. Negative for back pain.  Neurological:  Positive for weakness (local to left hand) and numbness (local to right thumb and first 3 fingers.). Negative for headaches.   History Past Medical History:  Diagnosis Date   Anginal pain ([place])    Arthritis    L shoulder- has had injections, degenerative changes in lumbar spine    Back pain    Chicken pox as a child   Constipation    Dizziness    Dysrhythmia 2013   SVT- treated by ablation by Dr. Lovena Le to f/u with as needed basis    Encounter for preventative adult health care exam with abnormal  findings 02/09/2015   HTN (hypertension)    Hypokalemia 02/09/2015   Joint pain    Leg edema    Measles as a child   Measles as a child   Obesity    Panic attack    during episode of feeling to crowded    Preventative health care 02/09/2015   Seasonal allergies    SOB (shortness of breath) 09/23/11   "a little bit; at rest; before ablation", SOB again now (08/2014- due to lack  of exercise)    SVT (supraventricular tachycardia) ([place])    Uterine fibroid 12/17/2012   Cervical polyp per patient Follows with [facility], Dr Evette Cristal    She has a past surgical history that includes Cardiac electrophysiology study and ablation (09/23/11); Abdominal hysterectomy (1990's); Tubal ligation (1980's); supraventricular tachycardia ablation (N/A, 09/23/2011); and Maximum access (mas)posterior lumbar interbody fusion (plif) 1 level (N/A, 09/24/2014).   Her family history includes Breast cancer in her cousin; Cancer in her sister; Diabetes in her maternal aunt, maternal uncle, and mother; Heart disease in her father and mother; Hypertension in her daughter and mother; Kidney disease in her maternal aunt; Lupus in her daughter; Obesity in her father and mother; Stroke in her maternal grandmother and mother.She reports that she quit smoking about 36 years ago. Her smoking use included cigarettes. She has a 0.96 pack-year smoking history. She has never used smokeless tobacco. She reports current alcohol use of about  14.0 standard drinks per week. She reports that she does not use drugs.  Current Outpatient Medications on File Prior to Visit  Medication Sig Dispense Refill   atorvastatin (LIPITOR) 10 MG tablet Take 1 tablet (10 mg total) by mouth daily. 90 tablet 3   COVID-19 mRNA vaccine, Moderna, 100 MCG/0.5ML injection INJECT AS DIRECTED .25 mL 0   diclofenac (VOLTAREN) 75 MG EC tablet Take 1 tablet (75 mg total) by mouth 2 (two) times daily. 180 tablet 3   estradiol (ESTRACE) 2 MG tablet Take 1  tablet by mouth once daily 90 tablet 0   fluticasone (FLONASE) 50 MCG/ACT nasal spray Place 2 sprays into both nostrils daily. 32 g 1   methocarbamol (ROBAXIN) 500 MG tablet Take 1 tablet (500 mg total) by mouth every 6 (six) hours as needed for muscle spasms. 90 tablet 0   montelukast (SINGULAIR) 10 MG tablet Take 1 tablet (10 mg total) by mouth daily as needed. 90 tablet 3   OMEGA-3 KRILL OIL PO Take 353 mg by mouth daily. [facility]     potassium chloride SA (KLOR-CON) 20 MEQ tablet TAKE 1 TABLET BY MOUTH  TWICE DAILY 180 tablet 3   Probiotic Product (PROBIOTIC PO) Take 2 tablets by mouth at bedtime.     triamterene-hydrochlorothiazide (MAXZIDE-25) 37.5-25 MG tablet Take 1 tablet by mouth daily. 90 tablet 1   Wheat Dextrin (BENEFIBER) POWD Take 2-3 scoop by mouth daily as needed.     No current facility-administered medications on file prior to visit.     Objective:  Objective  Physical Exam Constitutional:      General: She is not in acute distress.    Appearance: Normal appearance. She is not ill-appearing or toxic-appearing.  HENT:     Head: Normocephalic and atraumatic.     Right Ear: Tympanic membrane, ear canal and external ear normal.     Left Ear: Tympanic membrane, ear canal and external ear normal.     Nose: No congestion or rhinorrhea.  Eyes:     Extraocular Movements: Extraocular movements intact.     Right eye: No nystagmus.     Left eye: No nystagmus.     Pupils: Pupils are equal, round, and reactive to light.  Cardiovascular:     Rate and Rhythm: Normal rate and regular rhythm.     Pulses: Normal pulses.     Heart sounds: Normal heart sounds. No murmur heard. Pulmonary:     Effort: Pulmonary effort is normal. No respiratory distress.     Breath sounds: Normal breath sounds. No wheezing, rhonchi or rales.  Abdominal:     General: Bowel sounds are normal.     Palpations: Abdomen is soft. There is no mass.     Tenderness: no abdominal tenderness There is  no guarding.     Hernia: No hernia is present.  Musculoskeletal:        General: Normal range of motion.     Cervical back: Normal range of motion and neck supple.  Skin:    General: Skin is warm and dry.  Neurological:     Mental Status: She is alert and oriented to person, place, and time.     Cranial Nerves: No facial asymmetry.     Motor: Motor function is intact. No weakness.  Psychiatric:        Behavior: Behavior normal.   BP 120/72   Pulse 80   Temp 97.7 F (36.5 C)   Resp 16   Ht  $'5\' 3"'f$  (1.6 m)   Wt 224 lb (101.6 kg)   SpO2 94%   BMI 39.68 kg/m  Wt Readings from Last 3 Encounters:  11/27/20 224 lb (101.6 kg)  02/26/20 225 lb 12.8 oz (102.4 kg)  11/16/19 225 lb 4 oz (102.2 kg)     Lab Results  Component Value Date   WBC 5.5 11/19/2020   HGB 13.2 11/19/2020   HCT 40.5 11/19/2020   PLT 219.0 11/19/2020   GLUCOSE 92 11/19/2020   CHOL 160 11/19/2020   TRIG 121.0 11/19/2020   HDL 53.50 11/19/2020   LDLDIRECT 143.0 08/29/2017   LDLCALC 82 11/19/2020   ALT 13 11/19/2020   AST 14 11/19/2020   NA 139 11/19/2020   K 4.0 11/19/2020   CL 105 11/19/2020   CREATININE 0.61 11/19/2020   BUN 16 11/19/2020   CO2 25 11/19/2020   TSH 1.39 11/19/2020   HGBA1C 5.4 11/19/2020    MR CERVICAL SPINE WO CONTRAST  Result Date: 10/13/2020 CLINICAL DATA:  Neck and right shoulder pain for 9 months EXAM: MRI CERVICAL SPINE WITHOUT CONTRAST TECHNIQUE: Multiplanar, multisequence MR imaging of the cervical spine was performed. No intravenous contrast was administered. COMPARISON:  None. FINDINGS: Alignment: Straightening of normal cervical lordosis. No static subluxation. Vertebrae: No fracture, evidence of discitis, or bone lesion. Cord: Normal signal and morphology. Posterior Fossa, vertebral arteries, paraspinal tissues: Visualized posterior fossa is normal. Vertebral artery flow voids are preserved. No prevertebral soft tissue swelling. Disc levels: C1-2: Mild degenerative change  C2-3: Small disc bulge with endplate spurring. There is no spinal canal stenosis. No neural foraminal stenosis. C3-4: Small disc bulge with uncovertebral hypertrophy. Moderate right facet hypertrophy with fluid in the facet joint. Mild spinal canal stenosis. Severe right and moderate left neural foraminal stenosis. C4-5: Left facet hypertrophy with small disc bulge and left uncovertebral spurring. Mild spinal canal stenosis. Mild left neural foraminal stenosis. C5-6: Disc bulge and uncovertebral hypertrophy. Moderate left facet arthrosis. Mild spinal canal stenosis. No neural foraminal stenosis. C6-7: Small disc bulge with uncovertebral spurring. Moderate spinal canal stenosis. Mild right neural foraminal stenosis. C7-T1: Normal disc. Bilateral uncovertebral spurring. There is no spinal canal stenosis. No neural foraminal stenosis. T1-2: Small disc bulge without spinal canal stenosis. Mild bilateral foraminal stenosis. IMPRESSION: 1. Moderate spinal canal stenosis at C6-7 with mild right neural foraminal stenosis. 2. Mild spinal canal stenosis at C3-4, C4-5 and C5-6. 3. Severe right and moderate left neural foraminal stenosis at C3-4. Electronically Signed   By: Ulyses Jarred M.D.   On: 10/13/2020 14:04     Assessment & Plan:  Plan    No orders of the defined types were placed in this encounter.   Problem List Items Addressed This Visit     Essential hypertension, benign    Well controlled no changes to meds. Encouraged heart healthy diet such as the DASH diet and exercise as tolerated.       Welcome to Medicare preventive visit    Patient denies any difficulties at home. No trouble with ADLs, depression or falls. See EMR for functional status screen and depression screen. No recent changes to vision or hearing. Is UTD with immunizations. Is UTD with screening. Discussed Advanced Directives. Encouraged heart healthy diet, exercise as tolerated and adequate sleep. Passed vision and hearing  screens See patient's problem list for health risk factors to monitor. See AVS for preventative healthcare recommendation schedule. Patient encouraged to maintain heart healthy diet, regular exercise, adequate sleep. Consider daily probiotics. Take  medications as prescribed. Labs reviewed. Given Tdap and flu shot today      Vitamin D deficiency    Supplement and monitor      Hyperglycemia    hgba1c acceptable, minimize simple carbs. Increase exercise as tolerated.       Dyspnea    Notes some increase especially with exertion. No significant EKG changes but some nonspecific T wave abnormalities. Will proceed with echo and consider cardiology consultation if any abnormalities are found or patient's symptoms progress      Relevant Orders   ECHOCARDIOGRAM COMPLETE   DDD (degenerative disc disease), cervical    Notes some radicular symptoms with numbness in digits 1-4 in right hand and weak grip in left hand. Is currently in PT and she thinks it is helping some      Other Visit Diagnoses     Medicare welcome exam    -  Primary   Relevant Orders   EKG 12-Lead (Completed)   Need for Tdap vaccination       Relevant Orders   Tdap vaccine greater than or equal to 7yo IM (Completed)   Need for influenza vaccination       Relevant Orders   Flu Vaccine QUAD 36+ mos IM (Fluarix, Fluzone & Afluria Quad PF (Completed)       Follow-up: Return in about 6 months (around 05/27/2021).  I, Suezanne Jacquet, acting as a scribe for Penni Homans, MD, have documented all relevent documentation on behalf of Penni Homans, MD, as directed by Penni Homans, MD while in the presence of Penni Homans, MD. DO:11/27/20.  I, Mosie Lukes, MD personally performed the services described in this documentation. All medical record entries made by the scribe were at my direction and in my presence. I have reviewed the chart and agree that the record reflects my personal performance and is accurate and complete

## 2020-11-27 NOTE — Assessment & Plan Note (Signed)
Notes some radicular symptoms with numbness in digits 1-4 in right hand and weak grip in left hand. Is currently in PT and she thinks it is helping some

## 2020-11-27 NOTE — Assessment & Plan Note (Signed)
Supplement and monitor 

## 2020-11-27 NOTE — Assessment & Plan Note (Addendum)
Patient denies any difficulties at home. No trouble with ADLs, depression or falls. See EMR for functional status screen and depression screen. No recent changes to vision or hearing. Is UTD with immunizations. Is UTD with screening. Discussed Advanced Directives. Encouraged heart healthy diet, exercise as tolerated and adequate sleep. Passed vision and hearing screens See patient's problem list for health risk factors to monitor. See AVS for preventative healthcare recommendation schedule. Patient encouraged to maintain heart healthy diet, regular exercise, adequate sleep. Consider daily probiotics. Take medications as prescribed. Labs reviewed. Given Tdap and flu shot today

## 2020-11-27 NOTE — Assessment & Plan Note (Signed)
hgba1c acceptable, minimize simple carbs. Increase exercise as tolerated.  

## 2020-11-27 NOTE — Patient Instructions (Signed)
Encouraged DASH or MIND diet, decrease po intake and increase exercise as tolerated. Needs 7-8 hours of sleep nightly. Avoid trans fats, eat small, frequent meals every 4-5 hours with lean proteins, complex carbs and healthy fats. Minimize simple carbs, high fat foods and processed foods    Paxlovid is the new COVID medication we can give you if you get COVID so make sure you test if you have symptoms because we have to treat by day 5 of symptoms for it to be effective. If you are positive let us know so we can treat. If a home test is negative and your symptoms are persistent get a PCR test. Can check testing locations at [email or web address] If you are positive we will make an appointment with [name] and we will send in Paxlovid if you would like it. Check with your pharmacy before we meet to confirm they have it in stock, if they do not then we can get the prescription at the [facility]    [name] , Thank you for taking time to come for your Medicare Wellness Visit. I appreciate your ongoing commitment to your health goals. Please review the following plan we discussed and let me know if I can assist you in the future.   These are the goals we discussed:  Goals   None     This is a list of the screening recommended for you and due dates:  Health Maintenance  Topic Date Due   COVID-19 Vaccine (4 - Booster for Moderna series) 07/02/2020   Flu Shot  10/27/2020   Pap Smear  05/30/2021   Mammogram  05/12/2022   Colon Cancer Screening  10/20/2022   Tetanus Vaccine  01/10/2028   Hepatitis C Screening: USPSTF Recommendation to screen - Ages 18-79 yo.  Completed   Zoster (Shingles) Vaccine  Completed   Pneumococcal Vaccination  Aged Out   HPV Vaccine  Aged Out   HIV Screening  Discontinued

## 2020-12-02 ENCOUNTER — Ambulatory Visit: Payer: PPO | Attending: Neurosurgery | Admitting: Physical Therapy

## 2020-12-02 ENCOUNTER — Encounter: Payer: Self-pay | Admitting: Physical Therapy

## 2020-12-02 DIAGNOSIS — M542 Cervicalgia: Secondary | ICD-10-CM | POA: Diagnosis not present

## 2020-12-02 DIAGNOSIS — M5412 Radiculopathy, cervical region: Secondary | ICD-10-CM | POA: Insufficient documentation

## 2020-12-02 DIAGNOSIS — M6281 Muscle weakness (generalized): Secondary | ICD-10-CM | POA: Insufficient documentation

## 2020-12-02 DIAGNOSIS — R29898 Other symptoms and signs involving the musculoskeletal system: Secondary | ICD-10-CM | POA: Insufficient documentation

## 2020-12-02 DIAGNOSIS — R293 Abnormal posture: Secondary | ICD-10-CM | POA: Insufficient documentation

## 2020-12-02 NOTE — Patient Instructions (Signed)
    Access Code: [email or web address] URL: [email or web address] Date: 12/02/2020 Prepared by: Annie Paras  Exercises Seated Cervical Retraction - 2-3 x daily - 7 x weekly - 2 sets - 10 reps - 3-5 sec hold Supine Cervical Retraction with Towel - 2-3 x daily - 7 x weekly - 2 sets - 10 reps - 3-5 sec hold Seated Gentle Upper Trapezius Stretch - 2-3 x daily - 7 x weekly - 3 reps - 30 sec hold Gentle Levator Scapulae Stretch - 2-3 x daily - 7 x weekly - 3 reps - 30 sec hold Seated Scapular Retraction - 2-3 x daily - 7 x weekly - 2 sets - 10 reps - 5 sec hold Standing Bilateral Low Shoulder Row with Anchored Resistance - 1 x daily - 7 x weekly - 2 sets - 10 reps - 5 sec hold Scapular Retraction with Resistance Advanced - 1 x daily - 7 x weekly - 2 sets - 10 reps - 5 sec hold Standing Shoulder Horizontal Abduction with Resistance - 1 x daily - 7 x weekly - 2 sets - 10 reps Standing Shoulder External Rotation with Resistance - 1 x daily - 7 x weekly - 2 sets - 10 reps Standing Radial Nerve Glide - 1 x daily - 7 x weekly - 2 sets - 10 reps Standing Median Nerve Glide - 1 x daily - 7 x weekly - 2 sets - 10 reps

## 2020-12-02 NOTE — Therapy (Addendum)
[facility] [address]  [place], [place], [address] Phone: [phone]   Fax:  [phone]  Physical Therapy Treatment  Patient Details  Name: Sherri Sullivan MRN: [id number] Date of Birth: [birth year] Referring Provider (PT): Fenton Malling, NP   Encounter Date: 12/02/2020   PT End of Session - 12/02/20 1401     Visit Number 4    Number of Visits 16    Date for PT Re-Evaluation 01/13/21    Authorization Type [employer]    PT Start Time 1401    PT Stop Time [id number]    PT Time Calculation (min) 44 min    Activity Tolerance Patient tolerated treatment well    Behavior During Therapy [facility] for tasks assessed/performed             Past Medical History:  Diagnosis Date   Anginal pain ([place])    Arthritis    L shoulder- has had injections, degenerative changes in lumbar spine    Back pain    Chicken pox as a child   Constipation    Dizziness    Dysrhythmia 2013   SVT- treated by ablation by Dr. Lovena Le to f/u with as needed basis    Encounter for preventative adult health care exam with abnormal findings 02/09/2015   HTN (hypertension)    Hypokalemia 02/09/2015   Joint pain    Leg edema    Measles as a child   Measles as a child   Obesity    Panic attack    during episode of feeling to crowded    Preventative health care 02/09/2015   Seasonal allergies    SOB (shortness of breath) 09/23/11   "a little bit; at rest; before ablation", SOB again now (08/2014- due to lack  of exercise)    SVT (supraventricular tachycardia) ([place])    Uterine fibroid 12/17/2012   Cervical polyp per patient Follows with [facility], Dr Evette Cristal    Past Surgical History:  Procedure Laterality Date   ABDOMINAL HYSTERECTOMY  1990's   CARDIAC ELECTROPHYSIOLOGY STUDY AND ABLATION  09/23/11   MAXIMUM ACCESS (MAS)POSTERIOR LUMBAR INTERBODY FUSION (PLIF) 1 LEVEL N/A 09/24/2014   Procedure: L5-S1 MAS PLIF ;  Surgeon:  Erline Levine, MD;  Location: [facility];  Service: Neurosurgery;  Laterality: N/A;  L5-S1 MAS PLIF fusion   SUPRAVENTRICULAR TACHYCARDIA ABLATION N/A 09/23/2011   Procedure: SUPRAVENTRICULAR TACHYCARDIA ABLATION;  Surgeon: Evans Lance, MD;  Location: [facility] CATH LAB;  Service: Cardiovascular;  Laterality: N/A;   TUBAL LIGATION  1980's    There were no vitals filed for this visit.   Subjective Assessment - 12/02/20 1404     Subjective Pt reports she was not able to do her HEP for the past several days due to feeling poorly following her flu shot and tDAP shot at her physical last week.    Diagnostic tests 10/11/20 Cervical MRI 1. Moderate spinal canal stenosis at C6-7 with mild right neural  foraminal stenosis.  2. Mild spinal canal stenosis at C3-4, C4-5 and C5-6.  3. Severe right and moderate left neural foraminal stenosis at C3-4.    Patient Stated Goals "to move my neck normally w/o pain and get rid the of the numbness in my hand"    Currently in Pain? No/denies  [facility] Adult PT Treatment/Exercise - 12/02/20 1401       Exercises   Exercises Neck      Neck Exercises: Machines for Strengthening   UBE (Upper Arm Bike) L2.0 x 6 min (3' fwd/3' back)      Neck Exercises: Theraband   Scapula Retraction 15 reps;Red    Scapula Retraction Limitations standing + shoulder extension to neutral with band anchored in door    Rows 10 reps;Red    Rows Limitations standing with band anchored in door    Horizontal ABduction 10 reps;Red    Horizontal ABduction Limitations standing, back against doorframe    Other Theraband Exercises Alt red TB UE diagonals 10 x 3", standing with back against doorframe      Neck Exercises: Seated   Cervical Rotation Both;10 reps    Cervical Rotation Limitations SNAGs with pillowcase    Other Seated Exercise cervical extension with neck supported using pillowcase x 10 reps      Hand Exercises   Digiticizer L/R hand  red Digi-Flex (3.0 lb) - full grip followed by individual finger squeeze x 10    Other Hand Exercises Red, green, blue, black clips 3-point & lateral pinches from thin to thick posts x 5                   PT Education - 12/02/20 1442     Education Details HEP update - nerve glides - Access Code: [email or web address]    Person(s) Educated Patient    Methods Explanation;Demonstration;Verbal cues;Handout    Comprehension Verbalized understanding;Verbal cues required;Returned demonstration;Need further instruction              PT Short Term Goals - 11/24/20 1405       PT SHORT TERM GOAL #1   Title Patient will be independent with initial HEP    Status On-going    Target Date 12/16/20               PT Long Term Goals - 11/26/20 1635       PT LONG TERM GOAL #1   Title Patient will be independent with ongoing/advanced HEP for self-management at home in order to build upon functional gains in therapy    Status On-going      PT LONG TERM GOAL #2   Title Improve posture and alignment with patient to demonstrate improved upright posture with posterior shoulder girdle engaged    Status On-going      PT LONG TERM GOAL #3   Title Decrease neck pain by >/= 50-75% allowing patient increased ease of cervical motion    Status On-going      PT LONG TERM GOAL #4   Title Patient to report resolution of R hand numbness    Status On-going      PT LONG TERM GOAL #5   Title Patient to report 50-75% reduction in frequency and intensity of headaches/migraines    Status On-going      PT LONG TERM GOAL #6   Title Patient to improve cervical AROM to [facility] without pain provocation    Status On-going      PT LONG TERM GOAL #7   Title Patient to report ability to perform ADLs, household, and leisure activities without limitation due to neck pain, LOM or UE weakness    Status On-going      PT LONG TERM GOAL #8   Title Patient will demonstrate improved B shoulder strength to >/= 4+/5 for  functional UE  use    Status On-going      PT LONG TERM GOAL  #9   TITLE Patient will demonstrate improved B grip strength by >/= 10# for improved functional use of hands    Status On-going                   Plan - 12/02/20 1409     Clinical Impression Statement Sherri Sullivan reports she has not been able to work on her HEP much over the past few days due to feeling poorly following her tDAP & flu vaccines last week but feels like she is probably ready to progress TB resistance. Attempted theraband exercises with red TB in standing with good tolerance therefore red TB provided for home use. Continued hand strengthening with good tolerance including individual finger flexion and pinch grip strengthening. Pt still noting intermittent UE numbness more to radial side of hand but unable to isolate to particular nerve distribution. Provided instruction in radial and median nerve glides to utilize to hopefully reduce or prevent onset of numbness, but also instructed pt to attempt utilize nerve glides to resolve numbness if it does recur and not which pattern seems to give best relief.    Comorbidities HTN, SVT, R RTC tendinopathy, L5-S1 fusion 2016    Rehab Potential Good    PT Frequency 2x / week    PT Duration 8 weeks    PT Treatment/Interventions ADLs/Self Care Home Management;Cryotherapy;Electrical Stimulation;Iontophoresis '4mg'$ /ml Dexamethasone;Moist Heat;Traction;Ultrasound;Functional mobility training;Therapeutic activities;Therapeutic exercise;Neuromuscular re-education;Patient/family education;Manual techniques;Passive range of motion;Dry needling;Taping;Spinal Manipulations;Joint Manipulations    PT Next Visit Plan assess response to nerve glides; cervical ROM; postural strengthening; grip strengthening; shoulder strengthening; manual therapy and modalities PRN    PT Home Exercise Plan Access Code: [email or web address] (8/23, updated 8/29, 8/31 & 9/6)    Consulted and Agree with Plan of Care Patient              Patient will benefit from skilled therapeutic intervention in order to improve the following deficits and impairments:  Decreased activity tolerance, Decreased knowledge of precautions, Decreased mobility, Decreased range of motion, Decreased strength, Hypomobility, Increased fascial restricitons, Increased muscle spasms, Impaired perceived functional ability, Impaired flexibility, Impaired UE functional use, Improper body mechanics, Postural dysfunction, Pain, Impaired sensation  Visit Diagnosis: Radiculopathy, cervical region  Cervicalgia  Abnormal posture  Muscle weakness (generalized)  Other symptoms and signs involving the musculoskeletal system     Problem List Patient Active Problem List   Diagnosis Date Noted   Dyspnea 11/27/2020   DDD (degenerative disc disease), cervical 11/27/2020   Chest pain 01/18/2019   Acute left-sided thoracic back pain 01/18/2019   Hyperglycemia 01/09/2018   Colon polyp 08/29/2017   Vitamin D deficiency 02/25/2017   Hypokalemia 02/09/2015   Encounter for preventative adult health care exam with abnormal findings 02/09/2015   Welcome to Medicare preventive visit 02/09/2015   Spondylolisthesis of lumbar region 09/24/2014   Neck pain 04/08/2014   Muscle cramps 01/13/2014   Tendinopathy of rotator cuff 11/28/2013   Essential hypertension, benign 11/28/2013   Urinary frequency 08/17/2013   Uterine fibroid 12/17/2012   Obesity    SVT (supraventricular tachycardia) ([place]) 09/21/2011   LOW BACK PAIN 01/04/2008   Hyperlipidemia 02/28/2007   Allergic rhinitis 02/28/2007   KNEE PAIN, LEFT 02/28/2007    Percival Spanish, PT 12/02/2020, 8:12 PM  [facility] [address]  [place], [place], [address] Phone: [phone]   Fax:  [phone]  Name: Sherri Sullivan MRN: [id number] Date of Birth: [birth year]

## 2020-12-08 ENCOUNTER — Ambulatory Visit: Payer: PPO | Admitting: Physical Therapy

## 2020-12-08 ENCOUNTER — Other Ambulatory Visit: Payer: Self-pay

## 2020-12-08 ENCOUNTER — Encounter: Payer: Self-pay | Admitting: Physical Therapy

## 2020-12-08 DIAGNOSIS — M6281 Muscle weakness (generalized): Secondary | ICD-10-CM

## 2020-12-08 DIAGNOSIS — R29898 Other symptoms and signs involving the musculoskeletal system: Secondary | ICD-10-CM

## 2020-12-08 DIAGNOSIS — M5412 Radiculopathy, cervical region: Secondary | ICD-10-CM

## 2020-12-08 DIAGNOSIS — M542 Cervicalgia: Secondary | ICD-10-CM

## 2020-12-08 DIAGNOSIS — R293 Abnormal posture: Secondary | ICD-10-CM

## 2020-12-08 NOTE — Therapy (Signed)
[facility] [address]  [place], [place], [address] Phone: [phone]   Fax:  [phone]  Physical Therapy Treatment  Patient Details  Name: Sherri Sullivan MRN: [id number] Date of Birth: [birth year] Referring Provider (PT): Fenton Malling, NP   Encounter Date: 12/08/2020   PT End of Session - 12/08/20 1450     Visit Number 5    Number of Visits 16    Date for PT Re-Evaluation 01/13/21    Authorization Type [employer]    PT Start Time 1450    PT Stop Time 1532    PT Time Calculation (min) 42 min    Activity Tolerance Patient tolerated treatment well    Behavior During Therapy [facility] for tasks assessed/performed             Past Medical History:  Diagnosis Date   Anginal pain ([place])    Arthritis    L shoulder- has had injections, degenerative changes in lumbar spine    Back pain    Chicken pox as a child   Constipation    Dizziness    Dysrhythmia 2013   SVT- treated by ablation by Dr. Lovena Le to f/u with as needed basis    Encounter for preventative adult health care exam with abnormal findings 02/09/2015   HTN (hypertension)    Hypokalemia 02/09/2015   Joint pain    Leg edema    Measles as a child   Measles as a child   Obesity    Panic attack    during episode of feeling to crowded    Preventative health care 02/09/2015   Seasonal allergies    SOB (shortness of breath) 09/23/11   "a little bit; at rest; before ablation", SOB again now (08/2014- due to lack  of exercise)    SVT (supraventricular tachycardia) ([place])    Uterine fibroid 12/17/2012   Cervical polyp per patient Follows with [facility], Dr Evette Cristal    Past Surgical History:  Procedure Laterality Date   ABDOMINAL HYSTERECTOMY  1990's   CARDIAC ELECTROPHYSIOLOGY STUDY AND ABLATION  09/23/11   MAXIMUM ACCESS (MAS)POSTERIOR LUMBAR INTERBODY FUSION (PLIF) 1 LEVEL N/A 09/24/2014   Procedure: L5-S1 MAS PLIF ;   Surgeon: Erline Levine, MD;  Location: [facility];  Service: Neurosurgery;  Laterality: N/A;  L5-S1 MAS PLIF fusion   SUPRAVENTRICULAR TACHYCARDIA ABLATION N/A 09/23/2011   Procedure: SUPRAVENTRICULAR TACHYCARDIA ABLATION;  Surgeon: Evans Lance, MD;  Location: [facility] CATH LAB;  Service: Cardiovascular;  Laterality: N/A;   TUBAL LIGATION  1980's    There were no vitals filed for this visit.   Subjective Assessment - 12/08/20 1455     Subjective Pt reports she was able to do the HEP 2x since last visit.    Diagnostic tests 10/11/20 Cervical MRI 1. Moderate spinal canal stenosis at C6-7 with mild right neural  foraminal stenosis.  2. Mild spinal canal stenosis at C3-4, C4-5 and C5-6.  3. Severe right and moderate left neural foraminal stenosis at C3-4.    Patient Stated Goals "to move my neck normally w/o pain and get rid the of the numbness in my hand"    Currently in Pain? Yes    Pain Score 4     Pain Location Shoulder    Pain Orientation Left;Upper    Pain Descriptors / Indicators Dull;Throbbing    Pain Type Chronic pain    Pain Radiating Towards occasional numbness and occasional  pins & needles on radial side of hand - less common - now mostly at night    Pain Frequency Intermittent                [facility] PT Assessment - 12/08/20 1450       AROM   Right Shoulder Flexion 148 Degrees    Right Shoulder ABduction 137 Degrees    Left Shoulder Flexion 145 Degrees    Left Shoulder ABduction 135 Degrees    Cervical Flexion 58    Cervical Extension 40    Cervical - Right Side Bend 35    Cervical - Left Side Bend 22    Cervical - Right Rotation 69    Cervical - Left Rotation 56                           [facility] Adult PT Treatment/Exercise - 12/08/20 1450       Neck Exercises: Machines for Strengthening   UBE (Upper Arm Bike) L2.0 x 6 min (3' fwd/3' back)    Cybex Row 15# 10 x 3"    Lat Pull 15# 10 x 3" seated pull-down to chest      Neck Exercises: Prone    Axial Exension 10 reps    Axial Extension Limitations POE    Neck Retraction 10 reps;3 secs   2 sets   Neck Retraction Limitations 2nd set + B cervical retraction while maintaining retraction   POE   W Back 10 reps    W Back Limitations W's over green Pball resting on mat table 10 x 3"    Shoulder Extension 15 reps    Shoulder Extension Limitations I's over green Pball resting on mat table    Other Prone Exercise T's over green Pball resting on mat table 10 x 3"    Other Prone Exercise Y's over green Pball resting on mat table 10 x 3"      Hand Exercises for Cervical Radiculopathy   Other Hand Exercise for Cervical Radiculopathy R radial & median nerve glides x 10    Other Hand Exercise for Cervical Radiculopathy R brachial plexus nerve glide x 10                     PT Education - 12/08/20 1549     Education Details Brachial plexus nerve glides    Person(s) Educated Patient    Methods Explanation;Demonstration;Verbal cues;Tactile cues;Handout    Comprehension Verbalized understanding;Verbal cues required;Tactile cues required;Returned demonstration;Need further instruction              PT Short Term Goals - 12/08/20 1500       PT SHORT TERM GOAL #1   Title Patient will be independent with initial HEP    Status Achieved   12/08/20              PT Long Term Goals - 12/08/20 1500       PT LONG TERM GOAL #1   Title Patient will be independent with ongoing/advanced HEP for self-management at home in order to build upon functional gains in therapy    Status On-going    Target Date 01/13/21      PT LONG TERM GOAL #2   Title Improve posture and alignment with patient to demonstrate improved upright posture with posterior shoulder girdle engaged    Status On-going    Target Date 01/13/21      PT LONG TERM  GOAL #3   Title Decrease neck pain by >/= 50-75% allowing patient increased ease of cervical motion    Status Achieved   12/08/20 - pt reports 75%  improvement in neck pain with improving ROM   Target Date --      PT LONG TERM GOAL #4   Title Patient to report resolution of R hand numbness    Status On-going    Target Date 01/13/21      PT LONG TERM GOAL #5   Title Patient to report 50-75% reduction in frequency and intensity of headaches/migraines    Status Partially Met   12/08/20 - no headache in >1 wk   Target Date 01/13/21      PT LONG TERM GOAL #6   Title Patient to improve cervical AROM to [facility] without pain provocation    Status Partially Met    Target Date 01/13/21      PT LONG TERM GOAL #7   Title Patient to report ability to perform ADLs, household, and leisure activities without limitation due to neck pain, LOM or UE weakness    Status On-going    Target Date 01/13/21      PT LONG TERM GOAL #8   Title Patient will demonstrate improved B shoulder strength to >/= 4+/5 for functional UE use    Status On-going    Target Date 01/13/21      PT LONG TERM GOAL  #9   TITLE Patient will demonstrate improved B grip strength by >/= 10# for improved functional use of hands    Status On-going    Target Date 01/13/21                   Plan - 12/08/20 1520     Clinical Impression Statement Daviana reports her pain has decreased by ~75% allowing for improved neck and shoulder ROM as well as reduction in frequency of headaches with no headache noted in >1 week. She reports good comfort with the HEP and feels that the nerve glides have been helping but still uncertain of more precise localization of the numbness and tingling as it most commonly occurs at night while in bed, particularly if she tries to lay to her R side. Her ROM for both her neck and shoulders has improved in all planes, although L side bending and rotation remain more limited than R. She is tolerating exercise progression well including more gravity resisted postural strengthening. Progress demonstrated toward all goals with STG #1 & LTG #3 met, as well as LTGs  #5 & 6 partially met. Jacqueli is very pleased with her progress thus far and hopeful that she can avoid surgery.    Comorbidities HTN, SVT, R RTC tendinopathy, L5-S1 fusion 2016    Rehab Potential Good    PT Frequency 2x / week    PT Duration 8 weeks    PT Treatment/Interventions ADLs/Self Care Home Management;Cryotherapy;Electrical Stimulation;Iontophoresis 18m/ml Dexamethasone;Moist Heat;Traction;Ultrasound;Functional mobility training;Therapeutic activities;Therapeutic exercise;Neuromuscular re-education;Patient/family education;Manual techniques;Passive range of motion;Dry needling;Taping;Spinal Manipulations;Joint Manipulations    PT Next Visit Plan cervical ROM; postural strengthening; grip strengthening; shoulder strengthening; manual therapy and modalities PRN    PT Home Exercise Plan Access Code: [email or web address](8/23, updated 8/29, 8/31, 9/6 & 9/12)    Consulted and Agree with Plan of Care Patient             Patient will benefit from skilled therapeutic intervention in order to improve the following deficits and impairments:  Decreased activity tolerance, Decreased knowledge  of precautions, Decreased mobility, Decreased range of motion, Decreased strength, Hypomobility, Increased fascial restricitons, Increased muscle spasms, Impaired perceived functional ability, Impaired flexibility, Impaired UE functional use, Improper body mechanics, Postural dysfunction, Pain, Impaired sensation  Visit Diagnosis: Radiculopathy, cervical region  Cervicalgia  Abnormal posture  Muscle weakness (generalized)  Other symptoms and signs involving the musculoskeletal system     Problem List Patient Active Problem List   Diagnosis Date Noted   Dyspnea 11/27/2020   DDD (degenerative disc disease), cervical 11/27/2020   Chest pain 01/18/2019   Acute left-sided thoracic back pain 01/18/2019   Hyperglycemia 01/09/2018   Colon polyp 08/29/2017   Vitamin D deficiency 02/25/2017   Hypokalemia  02/09/2015   Encounter for preventative adult health care exam with abnormal findings 02/09/2015   Welcome to Medicare preventive visit 02/09/2015   Spondylolisthesis of lumbar region 09/24/2014   Neck pain 04/08/2014   Muscle cramps 01/13/2014   Tendinopathy of rotator cuff 11/28/2013   Essential hypertension, benign 11/28/2013   Urinary frequency 08/17/2013   Uterine fibroid 12/17/2012   Obesity    SVT (supraventricular tachycardia) ([place]) 09/21/2011   LOW BACK PAIN 01/04/2008   Hyperlipidemia 02/28/2007   Allergic rhinitis 02/28/2007   KNEE PAIN, LEFT 02/28/2007    Percival Spanish, PT 12/08/2020, 3:53 PM  [facility] [address]  [place], [place], [address] Phone: [phone]   Fax:  [phone]  Name: KAYTI POSS MRN: [id number] Date of Birth: [birth year]

## 2020-12-11 ENCOUNTER — Other Ambulatory Visit: Payer: Self-pay

## 2020-12-11 ENCOUNTER — Encounter: Payer: Self-pay | Admitting: Physical Therapy

## 2020-12-11 ENCOUNTER — Ambulatory Visit: Payer: PPO | Admitting: Physical Therapy

## 2020-12-11 DIAGNOSIS — M6281 Muscle weakness (generalized): Secondary | ICD-10-CM

## 2020-12-11 DIAGNOSIS — R293 Abnormal posture: Secondary | ICD-10-CM

## 2020-12-11 DIAGNOSIS — M542 Cervicalgia: Secondary | ICD-10-CM

## 2020-12-11 DIAGNOSIS — M5412 Radiculopathy, cervical region: Secondary | ICD-10-CM | POA: Diagnosis not present

## 2020-12-11 DIAGNOSIS — R29898 Other symptoms and signs involving the musculoskeletal system: Secondary | ICD-10-CM

## 2020-12-11 NOTE — Therapy (Signed)
[facility] [address]  [place], [place], [address] Phone: [phone]   Fax:  [phone]  Physical Therapy Treatment  Patient Details  Name: Sherri Sullivan MRN: [id number] Date of Birth: 12/04/1960 Referring Provider (PT): Fenton Malling, NP   Encounter Date: 12/11/2020   PT End of Session - 12/11/20 1533     Visit Number 6    Number of Visits 16    Date for PT Re-Evaluation 01/13/21    Authorization Type [employer]    PT Start Time 1533    PT Stop Time 1627    PT Time Calculation (min) 54 min    Activity Tolerance Patient tolerated treatment well    Behavior During Therapy [facility] for tasks assessed/performed             Past Medical History:  Diagnosis Date   Anginal pain ([place])    Arthritis    L shoulder- has had injections, degenerative changes in lumbar spine    Back pain    Chicken pox as a child   Constipation    Dizziness    Dysrhythmia 2013   SVT- treated by ablation by Dr. Lovena Le to f/u with as needed basis    Encounter for preventative adult health care exam with abnormal findings 02/09/2015   HTN (hypertension)    Hypokalemia 02/09/2015   Joint pain    Leg edema    Measles as a child   Measles as a child   Obesity    Panic attack    during episode of feeling to crowded    Preventative health care 02/09/2015   Seasonal allergies    SOB (shortness of breath) 09/23/11   "a little bit; at rest; before ablation", SOB again now (08/2014- due to lack  of exercise)    SVT (supraventricular tachycardia) ([place])    Uterine fibroid 12/17/2012   Cervical polyp per patient Follows with [facility], Dr Evette Cristal    Past Surgical History:  Procedure Laterality Date   ABDOMINAL HYSTERECTOMY  1990's   CARDIAC ELECTROPHYSIOLOGY STUDY AND ABLATION  09/23/11   MAXIMUM ACCESS (MAS)POSTERIOR LUMBAR INTERBODY FUSION (PLIF) 1 LEVEL N/A 09/24/2014   Procedure: L5-S1 MAS PLIF ;   Surgeon: Erline Levine, MD;  Location: [place] [facility]UR[facility] ORS;  Service: Neurosurgery;  Laterality: N/A;  L5-S1 MAS PLIF fusion   SUPRAVENTRICULAR TACHYCARDIA ABLATION N/A 09/23/2011   Procedure: SUPRAVENTRICULAR TACHYCARDIA ABLATION;  Surgeon: Evans Lance, MD;  Location: [facility] CATH LAB;  Service: Cardiovascular;  Laterality: N/A;   TUBAL LIGATION  1980's    There were no vitals filed for this visit.   Subjective Assessment - 12/11/20 1537     Subjective Pt reports she was planting yesterday and notes increased shoulder and LBP today.    Diagnostic tests 10/11/20 Cervical MRI 1. Moderate spinal canal stenosis at C6-7 with mild right neural  foraminal stenosis.  2. Mild spinal canal stenosis at C3-4, C4-5 and C5-6.  3. Severe right and moderate left neural foraminal stenosis at C3-4.    Patient Stated Goals "to move my neck normally w/o pain and get rid the of the numbness in my hand"    Currently in Pain? Yes    Pain Score 6     Pain Location Shoulder    Pain Orientation Left;Upper    Pain Descriptors / Indicators Dull;Throbbing    Pain Score 8    Pain Location Back    Pain Orientation  Lower    Pain Descriptors / Indicators Burning    Pain Type Acute pain;Chronic pain    Pain Onset Yesterday    Pain Frequency Occasional                               [facility] Adult PT Treatment/Exercise - 12/11/20 1533       Exercises   Exercises Neck      Neck Exercises: Machines for Strengthening   UBE (Upper Arm Bike) L2.5 x 6 min (3' fwd/3' back)      Neck Exercises: Theraband   Shoulder Extension 10 reps;Red    Shoulder Extension Limitations hooklying with cues for TrA isometric    Shoulder External Rotation 10 reps;Red    Shoulder External Rotation Limitations hooklying with cues for TrA isometric    Horizontal ABduction 10 reps;Red    Horizontal ABduction Limitations hooklying with cues for TrA isometric    Other Theraband Exercises Alt red TB UE diagonals 10 x 3", hooklying with  cues for TrA isometric      Lumbar Exercises: Stretches   Other Lumbar Stretch Exercise seated 3-way prayer stretch with green Pball for low back and shoulder stretches 3 x 30 sec      Lumbar Exercises: Seated   Other Seated Lumbar Exercises R/L pallof press with red TB 2 x 10 reps      Lumbar Exercises: Supine   Dead Bug 10 reps;3 seconds    Dead Bug Limitations from hooklying      Hand Exercises   Digiticizer L/R hand red Digi-Flex (3.0 lb) - full grip followed by individual finger squeeze x 10    Other Hand Exercises R/L [place] grip strengthening - green spring - 15# x 10      Modalities   Modalities Moist Heat      Moist Heat Therapy   Number Minutes Moist Heat 10 Minutes    Moist Heat Location Shoulder;Lumbar Spine                       PT Short Term Goals - 12/08/20 1500       PT SHORT TERM GOAL #1   Title Patient will be independent with initial HEP    Status Achieved   12/08/20              PT Long Term Goals - 12/08/20 1500       PT LONG TERM GOAL #1   Title Patient will be independent with ongoing/advanced HEP for self-management at home in order to build upon functional gains in therapy    Status On-going    Target Date 01/13/21      PT LONG TERM GOAL #2   Title Improve posture and alignment with patient to demonstrate improved upright posture with posterior shoulder girdle engaged    Status On-going    Target Date 01/13/21      PT LONG TERM GOAL #3   Title Decrease neck pain by >/= 50-75% allowing patient increased ease of cervical motion    Status Achieved   12/08/20 - pt reports 75% improvement in neck pain with improving ROM   Target Date --      PT LONG TERM GOAL #4   Title Patient to report resolution of R hand numbness    Status On-going    Target Date 01/13/21      PT LONG TERM GOAL #5  Title Patient to report 50-75% reduction in frequency and intensity of headaches/migraines    Status Partially Met   12/08/20 - no  headache in >1 wk   Target Date 01/13/21      PT LONG TERM GOAL #6   Title Patient to improve cervical AROM to [facility] without pain provocation    Status Partially Met    Target Date 01/13/21      PT LONG TERM GOAL #7   Title Patient to report ability to perform ADLs, household, and leisure activities without limitation due to neck pain, LOM or UE weakness    Status On-going    Target Date 01/13/21      PT LONG TERM GOAL #8   Title Patient will demonstrate improved B shoulder strength to >/= 4+/5 for functional UE use    Status On-going    Target Date 01/13/21      PT LONG TERM GOAL  #9   TITLE Patient will demonstrate improved B grip strength by >/= 10# for improved functional use of hands    Status On-going    Target Date 01/13/21                   Plan - 12/11/20 1612     Clinical Impression Statement Noga reports increased R shoulder pain as well as LBP after doing some planting in her yard yesterday. Due to increased pain in both shoulder and low back, therapeutic exercises and stretches modified today to better incorporate more lumbopelvic muscle activation for improved lumbar support as well as stretching to decrease muscle tension for decreased pain. Pt reporting pain somewhat better following exercises but not fully resolved, therefore session conclude with moist heat application to her R shoulder and low back for further pain reduction.    Comorbidities HTN, SVT, R RTC tendinopathy, L5-S1 fusion 2016    Rehab Potential Good    PT Frequency 2x / week    PT Duration 8 weeks    PT Treatment/Interventions ADLs/Self Care Home Management;Cryotherapy;Electrical Stimulation;Iontophoresis 45m/ml Dexamethasone;Moist Heat;Traction;Ultrasound;Functional mobility training;Therapeutic activities;Therapeutic exercise;Neuromuscular re-education;Patient/family education;Manual techniques;Passive range of motion;Dry needling;Taping;Spinal Manipulations;Joint Manipulations    PT Next  Visit Plan cervical ROM; postural strengthening; grip strengthening; shoulder strengthening; manual therapy and modalities PRN    PT Home Exercise Plan Access Code: [email or web address](8/23, updated 8/29, 8/31, 9/6 & 9/12)    Consulted and Agree with Plan of Care Patient             Patient will benefit from skilled therapeutic intervention in order to improve the following deficits and impairments:  Decreased activity tolerance, Decreased knowledge of precautions, Decreased mobility, Decreased range of motion, Decreased strength, Hypomobility, Increased fascial restricitons, Increased muscle spasms, Impaired perceived functional ability, Impaired flexibility, Impaired UE functional use, Improper body mechanics, Postural dysfunction, Pain, Impaired sensation  Visit Diagnosis: Radiculopathy, cervical region  Cervicalgia  Abnormal posture  Muscle weakness (generalized)  Other symptoms and signs involving the musculoskeletal system     Problem List Patient Active Problem List   Diagnosis Date Noted   Dyspnea 11/27/2020   DDD (degenerative disc disease), cervical 11/27/2020   Chest pain 01/18/2019   Acute left-sided thoracic back pain 01/18/2019   Hyperglycemia 01/09/2018   Colon polyp 08/29/2017   Vitamin D deficiency 02/25/2017   Hypokalemia 02/09/2015   Encounter for preventative adult health care exam with abnormal findings 02/09/2015   Welcome to Medicare preventive visit 02/09/2015   Spondylolisthesis of lumbar region 09/24/2014   Neck pain 04/08/2014  Muscle cramps 01/13/2014   Tendinopathy of rotator cuff 11/28/2013   Essential hypertension, benign 11/28/2013   Urinary frequency 08/17/2013   Uterine fibroid 12/17/2012   Obesity    SVT (supraventricular tachycardia) ([place]) 09/21/2011   LOW BACK PAIN 01/04/2008   Hyperlipidemia 02/28/2007   Allergic rhinitis 02/28/2007   KNEE PAIN, LEFT 02/28/2007    Percival Spanish, PT 12/11/2020, 6:52 PM  [facility] [address]  [place], [place], [address] Phone: [phone]   Fax:  [phone]  Name: CHINA DEITRICK MRN: [id number] Date of Birth: October 17, 1960

## 2020-12-12 ENCOUNTER — Ambulatory Visit (HOSPITAL_BASED_OUTPATIENT_CLINIC_OR_DEPARTMENT_OTHER)
Admission: RE | Admit: 2020-12-12 | Discharge: 2020-12-12 | Disposition: A | Discharge status: Home / Self Care | Payer: PPO | Source: Ambulatory Visit | Attending: Family Medicine | Admitting: Family Medicine

## 2020-12-12 DIAGNOSIS — R0600 Dyspnea, unspecified: Secondary | ICD-10-CM | POA: Diagnosis not present

## 2020-12-12 DIAGNOSIS — R0609 Other forms of dyspnea: Secondary | ICD-10-CM

## 2020-12-12 LAB — ECHOCARDIOGRAM COMPLETE
AR max vel: 2.85 cm2
AV Area VTI: 2.8 cm2
AV Area mean vel: 2.96 cm2
AV Mean grad: 3 mmHg
AV Peak grad: 6.4 mmHg
Ao pk vel: 1.26 m/s
Area-P 1/2: 3.31 cm2
Calc EF: 62.5 %
S' Lateral: 2.89 cm
Single Plane A2C EF: 67.5 %
Single Plane A4C EF: 55.4 %

## 2020-12-15 ENCOUNTER — Ambulatory Visit: Payer: PPO

## 2020-12-15 ENCOUNTER — Other Ambulatory Visit: Payer: Self-pay

## 2020-12-15 DIAGNOSIS — R293 Abnormal posture: Secondary | ICD-10-CM

## 2020-12-15 DIAGNOSIS — M5412 Radiculopathy, cervical region: Secondary | ICD-10-CM

## 2020-12-15 DIAGNOSIS — M6281 Muscle weakness (generalized): Secondary | ICD-10-CM

## 2020-12-15 DIAGNOSIS — R29898 Other symptoms and signs involving the musculoskeletal system: Secondary | ICD-10-CM

## 2020-12-15 DIAGNOSIS — M542 Cervicalgia: Secondary | ICD-10-CM

## 2020-12-15 NOTE — Therapy (Signed)
[facility] [address]  [place], [place], [address] Phone: [phone]   Fax:  [phone]  Physical Therapy Treatment  Patient Details  Name: Sherri Sullivan MRN: [id number] Date of Birth: [birth year] Referring Provider (PT): Fenton Malling, NP   Encounter Date: 12/15/2020   PT End of Session - 12/15/20 1612     Visit Number 7    Number of Visits 16    Date for PT Re-Evaluation 01/13/21    Authorization Type [employer]    PT Start Time 1531    PT Stop Time 8341    PT Time Calculation (min) 50 min    Activity Tolerance Patient tolerated treatment well    Behavior During Therapy [facility] for tasks assessed/performed             Past Medical History:  Diagnosis Date   Anginal pain ([place])    Arthritis    L shoulder- has had injections, degenerative changes in lumbar spine    Back pain    Chicken pox as a child   Constipation    Dizziness    Dysrhythmia 2013   SVT- treated by ablation by Dr. Lovena Le to f/u with as needed basis    Encounter for preventative adult health care exam with abnormal findings 02/09/2015   HTN (hypertension)    Hypokalemia 02/09/2015   Joint pain    Leg edema    Measles as a child   Measles as a child   Obesity    Panic attack    during episode of feeling to crowded    Preventative health care 02/09/2015   Seasonal allergies    SOB (shortness of breath) 09/23/11   "a little bit; at rest; before ablation", SOB again now (08/2014- due to lack  of exercise)    SVT (supraventricular tachycardia) ([place])    Uterine fibroid 12/17/2012   Cervical polyp per patient Follows with [facility], Dr Evette Cristal    Past Surgical History:  Procedure Laterality Date   ABDOMINAL HYSTERECTOMY  1990's   CARDIAC ELECTROPHYSIOLOGY STUDY AND ABLATION  09/23/11   MAXIMUM ACCESS (MAS)POSTERIOR LUMBAR INTERBODY FUSION (PLIF) 1 LEVEL N/A 09/24/2014   Procedure: L5-S1 MAS PLIF ;   Surgeon: Erline Levine, MD;  Location: [place] NEURO ORS;  Service: Neurosurgery;  Laterality: N/A;  L5-S1 MAS PLIF fusion   SUPRAVENTRICULAR TACHYCARDIA ABLATION N/A 09/23/2011   Procedure: SUPRAVENTRICULAR TACHYCARDIA ABLATION;  Surgeon: Evans Lance, MD;  Location: [facility] CATH LAB;  Service: Cardiovascular;  Laterality: N/A;   TUBAL LIGATION  1980's    There were no vitals filed for this visit.   Subjective Assessment - 12/15/20 1534     Subjective Pt reports that she feels that she can progress her home exercises a bit today.    Diagnostic tests 10/11/20 Cervical MRI 1. Moderate spinal canal stenosis at C6-7 with mild right neural  foraminal stenosis.  2. Mild spinal canal stenosis at C3-4, C4-5 and C5-6.  3. Severe right and moderate left neural foraminal stenosis at C3-4.    Patient Stated Goals "to move my neck normally w/o pain and get rid the of the numbness in my hand"    Currently in Pain? Yes    Pain Score 5     Pain Location Shoulder    Pain Orientation Left;Upper    Pain Descriptors / Indicators Dull;Throbbing    Pain Type Chronic pain  [facility] Adult PT Treatment/Exercise - 12/15/20 0001       Exercises   Exercises Neck      Neck Exercises: Machines for Strengthening   UBE (Upper Arm Bike) L2.0 x 6 min (3' fwd/3' back)      Neck Exercises: Theraband   Shoulder Extension 10 reps;Green    Shoulder Extension Limitations standing    Rows 10 reps;Green    Rows Limitations standing    Shoulder External Rotation 10 reps;Green    Shoulder External Rotation Limitations standing against wall    Horizontal ABduction 10 reps;Green    Horizontal ABduction Limitations standing against wall      Modalities   Modalities Moist Heat      Moist Heat Therapy   Number Minutes Moist Heat 10 Minutes    Moist Heat Location Shoulder;Lumbar Spine      Manual Therapy   Manual Therapy Soft tissue mobilization;Myofascial release    Soft tissue  mobilization STM to B UT, SCM, scalenes    Myofascial Release manual TPR to B UT, SCM                       PT Short Term Goals - 12/08/20 1500       PT SHORT TERM GOAL #1   Title Patient will be independent with initial HEP    Status Achieved   12/08/20              PT Long Term Goals - 12/08/20 1500       PT LONG TERM GOAL #1   Title Patient will be independent with ongoing/advanced HEP for self-management at home in order to build upon functional gains in therapy    Status On-going    Target Date 01/13/21      PT LONG TERM GOAL #2   Title Improve posture and alignment with patient to demonstrate improved upright posture with posterior shoulder girdle engaged    Status On-going    Target Date 01/13/21      PT LONG TERM GOAL #3   Title Decrease neck pain by >/= 50-75% allowing patient increased ease of cervical motion    Status Achieved   12/08/20 - pt reports 75% improvement in neck pain with improving ROM   Target Date --      PT LONG TERM GOAL #4   Title Patient to report resolution of R hand numbness    Status On-going    Target Date 01/13/21      PT LONG TERM GOAL #5   Title Patient to report 50-75% reduction in frequency and intensity of headaches/migraines    Status Partially Met   12/08/20 - no headache in >1 wk   Target Date 01/13/21      PT LONG TERM GOAL #6   Title Patient to improve cervical AROM to [facility] without pain provocation    Status Partially Met    Target Date 01/13/21      PT LONG TERM GOAL #7   Title Patient to report ability to perform ADLs, household, and leisure activities without limitation due to neck pain, LOM or UE weakness    Status On-going    Target Date 01/13/21      PT LONG TERM GOAL #8   Title Patient will demonstrate improved B shoulder strength to >/= 4+/5 for functional UE use    Status On-going    Target Date 01/13/21      PT LONG TERM GOAL  #9  TITLE Patient will demonstrate improved B grip strength by  >/= 10# for improved functional use of hands    Status On-going    Target Date 01/13/21                   Plan - 12/15/20 1613     Clinical Impression Statement Pt still notes pain in her L upper shoulder. She also reported wanting to progress her HEP, so  we porgressed to green TB for scap stab exercises. Spent time to address soft tissue restrictions along the neck and upper shoulder musculature. Cues were needed with her exercises to target the correct muscles. Pt overall had a good response but would benefit from more soft tissue work.    Personal Factors and Comorbidities Time since onset of injury/illness/exacerbation;Past/Current Experience;Fitness;Age;Comorbidity 3+    Comorbidities HTN, SVT, R RTC tendinopathy, L5-S1 fusion 2016    PT Frequency 2x / week    PT Duration 8 weeks    PT Treatment/Interventions ADLs/Self Care Home Management;Cryotherapy;Electrical Stimulation;Iontophoresis 55m/ml Dexamethasone;Moist Heat;Traction;Ultrasound;Functional mobility training;Therapeutic activities;Therapeutic exercise;Neuromuscular re-education;Patient/family education;Manual techniques;Passive range of motion;Dry needling;Taping;Spinal Manipulations;Joint Manipulations    PT Next Visit Plan cervical ROM; postural strengthening; grip strengthening; shoulder strengthening; manual therapy and modalities PRN    PT Home Exercise Plan Access Code: [email or web address](8/23, updated 8/29, 8/31, 9/6 & 9/12)    Consulted and Agree with Plan of Care Patient             Patient will benefit from skilled therapeutic intervention in order to improve the following deficits and impairments:  Decreased activity tolerance, Decreased knowledge of precautions, Decreased mobility, Decreased range of motion, Decreased strength, Hypomobility, Increased fascial restricitons, Increased muscle spasms, Impaired perceived functional ability, Impaired flexibility, Impaired UE functional use, Improper body mechanics,  Postural dysfunction, Pain, Impaired sensation  Visit Diagnosis: Radiculopathy, cervical region  Cervicalgia  Abnormal posture  Muscle weakness (generalized)  Other symptoms and signs involving the musculoskeletal system     Problem List Patient Active Problem List   Diagnosis Date Noted   Dyspnea 11/27/2020   DDD (degenerative disc disease), cervical 11/27/2020   Chest pain 01/18/2019   Acute left-sided thoracic back pain 01/18/2019   Hyperglycemia 01/09/2018   Colon polyp 08/29/2017   Vitamin D deficiency 02/25/2017   Hypokalemia 02/09/2015   Encounter for preventative adult health care exam with abnormal findings 02/09/2015   Welcome to Medicare preventive visit 02/09/2015   Spondylolisthesis of lumbar region 09/24/2014   Neck pain 04/08/2014   Muscle cramps 01/13/2014   Tendinopathy of rotator cuff 11/28/2013   Essential hypertension, benign 11/28/2013   Urinary frequency 08/17/2013   Uterine fibroid 12/17/2012   Obesity    [place] 09/21/2011   LOW BACK PAIN 01/04/2008   Hyperlipidemia 02/28/2007   Allergic rhinitis 02/28/2007   KNEE PAIN, LEFT 02/28/2007    BArtist Pais PTA 12/15/2020, 6:05 PM  [facility] [address] [place] [address]Phone: [phone]  Fax:  [phone] Name: RAMARIYA LISKEYMRN: [id number] of Birth: [birth year]

## 2020-12-18 ENCOUNTER — Other Ambulatory Visit: Payer: Self-pay | Admitting: Family Medicine

## 2020-12-19 MED ORDER — ESTRADIOL 2 MG PO TABS: 2.0000 mg | ORAL_TABLET | Freq: Every day | ORAL | 0 refills | Status: DC

## 2020-12-19 MED ORDER — METHOCARBAMOL 500 MG PO TABS: 500.0000 mg | ORAL_TABLET | Freq: Four times a day (QID) | ORAL | 0 refills | Status: DC | PRN

## 2020-12-23 DIAGNOSIS — M5412 Radiculopathy, cervical region: Secondary | ICD-10-CM | POA: Diagnosis not present

## 2020-12-23 DIAGNOSIS — M5030 Other cervical disc degeneration, unspecified cervical region: Secondary | ICD-10-CM | POA: Diagnosis not present

## 2020-12-23 DIAGNOSIS — M47812 Spondylosis without myelopathy or radiculopathy, cervical region: Secondary | ICD-10-CM | POA: Diagnosis not present

## 2020-12-23 DIAGNOSIS — R200 Anesthesia of skin: Secondary | ICD-10-CM | POA: Diagnosis not present

## 2020-12-25 ENCOUNTER — Other Ambulatory Visit: Payer: Self-pay

## 2020-12-25 ENCOUNTER — Ambulatory Visit: Payer: PPO

## 2020-12-25 DIAGNOSIS — R29898 Other symptoms and signs involving the musculoskeletal system: Secondary | ICD-10-CM

## 2020-12-25 DIAGNOSIS — R293 Abnormal posture: Secondary | ICD-10-CM

## 2020-12-25 DIAGNOSIS — M5412 Radiculopathy, cervical region: Secondary | ICD-10-CM | POA: Diagnosis not present

## 2020-12-25 DIAGNOSIS — M542 Cervicalgia: Secondary | ICD-10-CM

## 2020-12-25 DIAGNOSIS — M6281 Muscle weakness (generalized): Secondary | ICD-10-CM

## 2020-12-25 NOTE — Therapy (Signed)
[facility] [address]  [place], [place], [address] Phone: [phone]   Fax:  [phone]  Physical Therapy Treatment  Patient Details  Name: Sherri Sullivan MRN: [id number] Date of Birth: 10/10/1960 Referring Provider (PT): Fenton Malling, NP   Encounter Date: 12/25/2020   PT End of Session - 12/25/20 1613     Visit Number 8    Number of Visits 16    Date for PT Re-Evaluation 01/13/21    Authorization Type [employer]    PT Start Time 1533    PT Stop Time 1612    PT Time Calculation (min) 39 min    Activity Tolerance Patient tolerated treatment well    Behavior During Therapy [facility] for tasks assessed/performed             Past Medical History:  Diagnosis Date   Anginal pain ([place])    Arthritis    L shoulder- has had injections, degenerative changes in lumbar spine    Back pain    Chicken pox as a child   Constipation    Dizziness    Dysrhythmia 2013   SVT- treated by ablation by Dr. Lovena Le to f/u with as needed basis    Encounter for preventative adult health care exam with abnormal findings 02/09/2015   HTN (hypertension)    Hypokalemia 02/09/2015   Joint pain    Leg edema    Measles as a child   Measles as a child   Obesity    Panic attack    during episode of feeling to crowded    Preventative health care 02/09/2015   Seasonal allergies    SOB (shortness of breath) 09/23/11   "a little bit; at rest; before ablation", SOB again now (08/2014- due to lack  of exercise)    SVT (supraventricular tachycardia) ([place])    Uterine fibroid 12/17/2012   Cervical polyp per patient Follows with [facility], Dr Evette Cristal    Past Surgical History:  Procedure Laterality Date   ABDOMINAL HYSTERECTOMY  1990's   CARDIAC ELECTROPHYSIOLOGY STUDY AND ABLATION  09/23/11   MAXIMUM ACCESS (MAS)POSTERIOR LUMBAR INTERBODY FUSION (PLIF) 1 LEVEL N/A 09/24/2014   Procedure: L5-S1 MAS PLIF ;   Surgeon: Erline Levine, MD;  Location: [facility];  Service: Neurosurgery;  Laterality: N/A;  L5-S1 MAS PLIF fusion   SUPRAVENTRICULAR TACHYCARDIA ABLATION N/A 09/23/2011   Procedure: SUPRAVENTRICULAR TACHYCARDIA ABLATION;  Surgeon: Evans Lance, MD;  Location: [facility] CATH LAB;  Service: Cardiovascular;  Laterality: N/A;   TUBAL LIGATION  1980's    There were no vitals filed for this visit.   Subjective Assessment - 12/25/20 1536     Subjective Pt notes no new complaints.    Diagnostic tests 10/11/20 Cervical MRI 1. Moderate spinal canal stenosis at C6-7 with mild right neural  foraminal stenosis.  2. Mild spinal canal stenosis at C3-4, C4-5 and C5-6.  3. Severe right and moderate left neural foraminal stenosis at C3-4.    Patient Stated Goals "to move my neck normally w/o pain and get rid the of the numbness in my hand"    Currently in Pain? No/denies                               [facility] Adult PT Treatment/Exercise - 12/25/20 0001       Neck Exercises: Machines for Strengthening   Nustep L5x100mn  Cybex Row 15# 2x10    Lat Pull 10# 2x10 standing extension      Neck Exercises: Land 3 reps;20 seconds    Other Neck Stretches seated lats stretch with elbows on counter 3x20"    Other Neck Stretches seated thoracic extension and rotation 5x5" each      Hand Exercises for Cervical Radiculopathy   Other Hand Exercise for Cervical Radiculopathy B putty squeezes with green theraputty 15 reps each                       PT Short Term Goals - 12/08/20 1500       PT SHORT TERM GOAL #1   Title Patient will be independent with initial HEP    Status Achieved   12/08/20              PT Long Term Goals - 12/25/20 1543       PT LONG TERM GOAL #1   Title Patient will be independent with ongoing/advanced HEP for self-management at home in order to build upon functional gains in therapy    Status On-going      PT LONG TERM GOAL #2    Title Improve posture and alignment with patient to demonstrate improved upright posture with posterior shoulder girdle engaged    Status On-going      PT LONG TERM GOAL #3   Title Decrease neck pain by >/= 50-75% allowing patient increased ease of cervical motion    Status Achieved   12/08/20 - pt reports 75% improvement in neck pain with improving ROM     PT LONG TERM GOAL #4   Title Patient to report resolution of R hand numbness    Status On-going      PT LONG TERM GOAL #5   Title Patient to report 50-75% reduction in frequency and intensity of headaches/migraines    Status Partially Met   12/25/20 - no headache in >3 wk     PT LONG TERM GOAL #6   Title Patient to improve cervical AROM to [facility] without pain provocation    Status Partially Met      PT LONG TERM GOAL #7   Title Patient to report ability to perform ADLs, household, and leisure activities without limitation due to neck pain, LOM or UE weakness    Status On-going      PT LONG TERM GOAL #8   Title Patient will demonstrate improved B shoulder strength to >/= 4+/5 for functional UE use    Status On-going      PT LONG TERM GOAL  #9   TITLE Patient will demonstrate improved B grip strength by >/= 10# for improved functional use of hands    Status On-going                   Plan - 12/25/20 1613     Clinical Impression Statement Pt notes that she has not had a migraine in over 3 weeks. She still has reports of numbness in her R hand at time but decreased intensity. Progressed HEP with green theraputty for grip strength and added thoracic mobility exercises for at home. She demonstrated a good performance of exercises today, with intermittent cues needed for technique. She is making progress toward goals and responded well to treatment.    Personal Factors and Comorbidities Time since onset of injury/illness/exacerbation;Past/Current Experience;Fitness;Age;Comorbidity 3+    Comorbidities HTN, SVT, R RTC  tendinopathy, L5-S1  fusion 2016    PT Frequency 2x / week    PT Duration 8 weeks    PT Treatment/Interventions ADLs/Self Care Home Management;Cryotherapy;Electrical Stimulation;Iontophoresis 23m/ml Dexamethasone;Moist Heat;Traction;Ultrasound;Functional mobility training;Therapeutic activities;Therapeutic exercise;Neuromuscular re-education;Patient/family education;Manual techniques;Passive range of motion;Dry needling;Taping;Spinal Manipulations;Joint Manipulations    PT Next Visit Plan cervical ROM; postural strengthening; grip strengthening; shoulder strengthening; manual therapy and modalities PRN    PT Home Exercise Plan Access Code: [email or web address](8/23, updated 8/29, 8/31, 9/6 & 9/12)    Consulted and Agree with Plan of Care Patient             Patient will benefit from skilled therapeutic intervention in order to improve the following deficits and impairments:  Decreased activity tolerance, Decreased knowledge of precautions, Decreased mobility, Decreased range of motion, Decreased strength, Hypomobility, Increased fascial restricitons, Increased muscle spasms, Impaired perceived functional ability, Impaired flexibility, Impaired UE functional use, Improper body mechanics, Postural dysfunction, Pain, Impaired sensation  Visit Diagnosis: Radiculopathy, cervical region  Cervicalgia  Abnormal posture  Muscle weakness (generalized)  Other symptoms and signs involving the musculoskeletal system     Problem List Patient Active Problem List   Diagnosis Date Noted   Dyspnea 11/27/2020   DDD (degenerative disc disease), cervical 11/27/2020   Chest pain 01/18/2019   Acute left-sided thoracic back pain 01/18/2019   Hyperglycemia 01/09/2018   Colon polyp 08/29/2017   Vitamin D deficiency 02/25/2017   Hypokalemia 02/09/2015   Encounter for preventative adult health care exam with abnormal findings 02/09/2015   Welcome to Medicare preventive visit 02/09/2015   Spondylolisthesis of  lumbar region 09/24/2014   Neck pain 04/08/2014   Muscle cramps 01/13/2014   Tendinopathy of rotator cuff 11/28/2013   Essential hypertension, benign 11/28/2013   Urinary frequency 08/17/2013   Uterine fibroid 12/17/2012   Obesity    [place] 09/21[place]   LOW BACK PAIN 01/04/2008   Hyperlipidemia 02/28/2007   Allergic rhinitis 02/28/2007   KNEE PAIN, LEFT 02/28/2007    BArtist Pais PTA 12/25/2020, 5:34 PM  [facility] [address] [place] [address]Phone: [phone]  Fax:  [phone] Name: Sherri SWARTZENTRUBERMRN: [id number] of Birth: [birth year]

## 2020-12-29 ENCOUNTER — Encounter: Payer: Self-pay | Admitting: Physical Therapy

## 2020-12-29 ENCOUNTER — Ambulatory Visit: Payer: PPO | Attending: Neurosurgery | Admitting: Physical Therapy

## 2020-12-29 ENCOUNTER — Other Ambulatory Visit: Payer: Self-pay

## 2020-12-29 DIAGNOSIS — M542 Cervicalgia: Secondary | ICD-10-CM | POA: Insufficient documentation

## 2020-12-29 DIAGNOSIS — M5412 Radiculopathy, cervical region: Secondary | ICD-10-CM | POA: Diagnosis not present

## 2020-12-29 DIAGNOSIS — M6281 Muscle weakness (generalized): Secondary | ICD-10-CM | POA: Diagnosis not present

## 2020-12-29 DIAGNOSIS — R29898 Other symptoms and signs involving the musculoskeletal system: Secondary | ICD-10-CM | POA: Insufficient documentation

## 2020-12-29 DIAGNOSIS — R293 Abnormal posture: Secondary | ICD-10-CM | POA: Diagnosis not present

## 2020-12-29 NOTE — Patient Instructions (Signed)
    Access Code: [email or web address] URL: [email or web address] Date: 12/29/2020 Prepared by: Annie Paras  Exercises Seated Cervical Retraction - 2-3 x daily - 7 x weekly - 2 sets - 10 reps - 3-5 sec hold Supine Cervical Retraction with Towel - 2-3 x daily - 7 x weekly - 2 sets - 10 reps - 3-5 sec hold Seated Gentle Upper Trapezius Stretch - 2-3 x daily - 7 x weekly - 3 reps - 30 sec hold Gentle Levator Scapulae Stretch - 2-3 x daily - 7 x weekly - 3 reps - 30 sec hold Seated Scapular Retraction - 2-3 x daily - 7 x weekly - 2 sets - 10 reps - 5 sec hold Standing Bilateral Low Shoulder Row with Anchored Resistance - 1 x daily - 7 x weekly - 2 sets - 10 reps - 5 sec hold Scapular Retraction with Resistance Advanced - 1 x daily - 7 x weekly - 2 sets - 10 reps - 5 sec hold Standing Shoulder Horizontal Abduction with Resistance - 1 x daily - 7 x weekly - 2 sets - 10 reps Standing Shoulder External Rotation with Resistance - 1 x daily - 7 x weekly - 2 sets - 10 reps Standing Radial Nerve Glide - 1 x daily - 7 x weekly - 2 sets - 10 reps Standing Median Nerve Glide - 1 x daily - 7 x weekly - 2 sets - 10 reps Seated Thoracic Self-Mobilization - 1 x daily - 7 x weekly - 2 sets - 10 reps - 3 sec hold Thoracic Mobilization on Foam Roll - Hands Clasped - 1 x daily - 7 x weekly - 2 sets - 10 reps - 3 sec hold Supine Chest Stretch on Foam Roll - 1 x daily - 7 x weekly - 2 sets - 5 reps - 5-10 sec hold Seated Assisted Cervical Rotation with Towel - 1 x daily - 7 x weekly - 2 sets - 10 reps - 3 sec hold  Patient Education Brachial plexus nerve glide

## 2020-12-29 NOTE — Therapy (Addendum)
[facility] [address]  [place], [place], [address] Phone: [phone]   Fax:  [phone]  Physical Therapy Treatment / Progress Note / Discharge Summary  Patient Details  Name: Sherri Sullivan MRN: [id number] Date of Birth: [birth year] Referring Provider (PT): Fenton Malling, NP  Progress Note  Reporting Period 11/18/2020 to 12/29/2020  See note below for Objective Data and Assessment of Progress/Goals.     Encounter Date: 12/29/2020   PT End of Session - 12/29/20 1532     Visit Number 9    Number of Visits 16    Date for PT Re-Evaluation 01/13/21    Authorization Type [employer]    PT Start Time 1532    PT Stop Time 2831    PT Time Calculation (min) 52 min    Activity Tolerance Patient tolerated treatment well    Behavior During Therapy WFL for tasks assessed/performed             Past Medical History:  Diagnosis Date   Anginal pain ([place])    Arthritis    L shoulder- has had injections, degenerative changes in lumbar spine    Back pain    Chicken pox as a child   Constipation    Dizziness    Dysrhythmia 2013   SVT- treated by ablation by Dr. Lovena Le to f/u with as needed basis    Encounter for preventative adult health care exam with abnormal findings 02/09/2015   HTN (hypertension)    Hypokalemia 02/09/2015   Joint pain    Leg edema    Measles as a child   Measles as a child   Obesity    Panic attack    during episode of feeling to crowded    Preventative health care 02/09/2015   Seasonal allergies    SOB (shortness of breath) 09/23/11   "a little bit; at rest; before ablation", SOB again now (08/2014- due to lack  of exercise)    SVT (supraventricular tachycardia) ([place])    Uterine fibroid 12/17/2012   Cervical polyp per patient Follows with [facility], Dr Evette Cristal    Past Surgical History:  Procedure Laterality Date   ABDOMINAL HYSTERECTOMY  1990's   CARDIAC  ELECTROPHYSIOLOGY STUDY AND ABLATION  09/23/11   MAXIMUM ACCESS (MAS)POSTERIOR LUMBAR INTERBODY FUSION (PLIF) 1 LEVEL N/A 09/24/2014   Procedure: L5-S1 MAS PLIF ;  Surgeon: Erline Levine, MD;  Location: [facility];  Service: Neurosurgery;  Laterality: N/A;  L5-S1 MAS PLIF fusion   SUPRAVENTRICULAR TACHYCARDIA ABLATION N/A 09/23/2011   Procedure: SUPRAVENTRICULAR TACHYCARDIA ABLATION;  Surgeon: Evans Lance, MD;  Location: [facility] CATH LAB;  Service: Cardiovascular;  Laterality: N/A;   TUBAL LIGATION  1980's    There were no vitals filed for this visit.   Subjective Assessment - 12/29/20 1534     Subjective Pt reports her f/u with the MD went well and he told her that she only needs f/u virtually unless she has a problem. She feels like she is probably ready to transition to her HEP now.    Diagnostic tests 10/11/20 Cervical MRI 1. Moderate spinal canal stenosis at C6-7 with mild right neural  foraminal stenosis.  2. Mild spinal canal stenosis at C3-4, C4-5 and C5-6.  3. Severe right and moderate left neural foraminal stenosis at C3-4.    Patient Stated Goals "to move my neck normally w/o pain and get rid the of the numbness in my  hand"    Pain Score 0-No pain    Pain Location Neck    Pain Score 8    Pain Location Back    Pain Orientation Lower    Pain Descriptors / Indicators Burning                [facility] PT Assessment - 12/29/20 1532       Assessment   Medical Diagnosis Cervical radiculopathy    Referring Provider (PT) Fenton Malling, NP    Onset Date/Surgical Date --   Sept 2021   Hand Dominance Right      Observation/Other Assessments   Focus on Therapeutic Outcomes (FOTO)  Neck = 68      AROM   Right Shoulder Flexion 152 Degrees    Right Shoulder ABduction 150 Degrees    Left Shoulder Flexion 158 Degrees    Left Shoulder ABduction 139 Degrees    Cervical Flexion 57    Cervical Extension 50    Cervical - Right Side Bend 40    Cervical - Left Side Bend 26    Cervical -  Right Rotation 58    Cervical - Left Rotation 52      Strength   Right Shoulder Flexion 4+/5    Right Shoulder ABduction 4+/5    Right Shoulder Internal Rotation 4+/5    Right Shoulder External Rotation 4+/5    Left Shoulder Flexion 4+/5    Left Shoulder ABduction 4+/5    Left Shoulder Internal Rotation 4+/5    Left Shoulder External Rotation 4+/5    Right Hand Grip (lbs) 34.33   46, 35, 22   Right Hand Lateral Pinch 12 lbs   12, 12, 12   Right Hand 3 Point Pinch 10 lbs   11, 9, 10   Left Hand Grip (lbs) 26.33   35, 24, 20   Left Hand Lateral Pinch 8.67 lbs   9, 9, 8   Left Hand 3 Point Pinch 9.33 lbs   11, 9, 8                          [facility] Adult PT Treatment/Exercise - 12/29/20 1532       Exercises   Exercises Neck      Neck Exercises: Machines for Strengthening   UBE (Upper Arm Bike) L2.5 x 6 min (3' fwd/3' back)      Neck Exercises: Seated   Cervical Rotation Both;10 reps    Cervical Rotation Limitations SNAGs with pillowcase                     PT Education - 12/29/20 1615     Education Details HEP update - cervical SNAGs and thoracic mobilization with FR - Access Code: [email or web address]    Person(s) Educated Patient    Methods Explanation;Demonstration;Verbal cues;Handout    Comprehension Verbalized understanding;Verbal cues required;Returned demonstration              PT Short Term Goals - 12/08/20 1500       PT SHORT TERM GOAL #1   Title Patient will be independent with initial HEP    Status Achieved   12/08/20              PT Long Term Goals - 12/29/20 1537       PT LONG TERM GOAL #1   Title Patient will be independent with ongoing/advanced HEP for self-management at home in order to build upon functional  gains in therapy    Status Achieved   12/29/20     PT LONG TERM GOAL #2   Title Improve posture and alignment with patient to demonstrate improved upright posture with posterior shoulder girdle engaged    Status  Achieved   12/29/20     PT LONG TERM GOAL #3   Title Decrease neck pain by >/= 50-75% allowing patient increased ease of cervical motion    Status Achieved   12/29/20 - pt reports 80% improvement in neck pain with improving ROM     PT LONG TERM GOAL #4   Title Patient to report resolution of R hand numbness    Status Partially Met   12/29/20 - still numb at times upon waking in the morning (although less intense) but resolves as she gets moving and does not usually recur during the day     PT LONG TERM GOAL #5   Title Patient to report 50-75% reduction in frequency and intensity of headaches/migraines    Status Achieved   12/29/20 - no headache in >4 wk     PT LONG TERM GOAL #6   Title Patient to improve cervical AROM to [facility] without pain provocation    Status Partially Met      PT LONG TERM GOAL #7   Title Patient to report ability to perform ADLs, household, and leisure activities without limitation due to neck pain, LOM or UE weakness    Status Achieved   12/29/20     PT LONG TERM GOAL #8   Title Patient will demonstrate improved B shoulder strength to >/= 4+/5 for functional UE use    Status Achieved   12/29/20     PT LONG TERM GOAL  #9   TITLE Patient will demonstrate improved B grip strength by >/= 10# for improved functional use of hands    Status Achieved   12/29/20                  Plan - 12/29/20 1624     Clinical Impression Statement [name] reports MD pleased with her progress and indicated that she should be able to continue on her own with her HEP, therefore session focused on final assessment and HEP review/update. She reports no headaches in >4 weeks and significant reduction (>80%) in neck pain and R UE radiculopathy, noting she only typically experiences numbness upon rising from bed in the morning which resolves as she gets moving. Cervical and B shoulder AROM now essentially WFL with only mild limitation still noted in B cervical rotation - reviewed self-AAROM with  cervical rotation SNAGs to help further improve cervical rotation. B shoulder and grip strength also significantly improved with only mild decreased grip L vs R which is consistent with non-dominant hand. All goals met except #4 (R hand numbness) & #6 (cervical ROM) only partially met due to aforementioned limitations. Brylynn feels confident with HEP and would like to proceed with transition to HEP but remain on a 30-day hold for PT in the event that issues arise with HEP, or pain/numbness worsen.    Comorbidities HTN, SVT, R RTC tendinopathy, L5-S1 fusion 2016    Rehab Potential Good    PT Treatment/Interventions ADLs/Self Care Home Management;Cryotherapy;Electrical Stimulation;Iontophoresis 52m/ml Dexamethasone;Moist Heat;Traction;Ultrasound;Functional mobility training;Therapeutic activities;Therapeutic exercise;Neuromuscular re-education;Patient/family education;Manual techniques;Passive range of motion;Dry needling;Taping;Spinal Manipulations;Joint Manipulations    PT Next Visit Plan transition to HEP + 30-day hold    PT Home Exercise Plan Access Code: [email or web address](8/23, updated 8/29, 8/31, 9/6,  9/12 & 10/3)    Consulted and Agree with Plan of Care Patient             Patient will benefit from skilled therapeutic intervention in order to improve the following deficits and impairments:  Decreased activity tolerance, Decreased knowledge of precautions, Decreased mobility, Decreased range of motion, Decreased strength, Hypomobility, Increased fascial restricitons, Increased muscle spasms, Impaired perceived functional ability, Impaired flexibility, Impaired UE functional use, Improper body mechanics, Postural dysfunction, Pain, Impaired sensation  Visit Diagnosis: Radiculopathy, cervical region  Cervicalgia  Abnormal posture  Muscle weakness (generalized)  Other symptoms and signs involving the musculoskeletal system     Problem List Patient Active Problem List   Diagnosis Date Noted    Dyspnea 11/27/2020   DDD (degenerative disc disease), cervical 11/27/2020   Chest pain 01/18/2019   Acute left-sided thoracic back pain 01/18/2019   Hyperglycemia 01/09/2018   Colon polyp 08/29/2017   Vitamin D deficiency 02/25/2017   Hypokalemia 02/09/2015   Encounter for preventative adult health care exam with abnormal findings 02/09/2015   Welcome to Medicare preventive visit 02/09/2015   Spondylolisthesis of lumbar region 09/24/2014   Neck pain 04/08/2014   Muscle cramps 01/13/2014   Tendinopathy of rotator cuff 11/28/2013   Essential hypertension, benign 11/28/2013   Urinary frequency 08/17/2013   Uterine fibroid 12/17/2012   Obesity    SVT (supraventricular tachycardia) ([place]) 09/21/2011   LOW BACK PAIN 01/04/2008   Hyperlipidemia 02/28/2007   Allergic rhinitis 02/28/2007   KNEE PAIN, LEFT 02/28/2007    Percival Spanish, PT 12/29/2020, 8:31 PM  [facility] [address]  [place], [place], [address] Phone: [phone]   Fax:  [phone]  Name: Sherri Sullivan MRN: [id number] Date of Birth: 16-Sep-1960   PHYSICAL THERAPY DISCHARGE SUMMARY  Visits from Start of Care: 9  Current functional level related to goals / functional outcomes:   Refer to above clinical impression for status as of last visit on 12/29/2020. Patient was placed on hold for 30 days and has not needed to return to PT, therefore will proceed with discharge from PT for this episode.   Remaining deficits:   As above.   Education / Equipment:   HEP   Patient agrees to discharge. Patient goals were partially met. Patient is being discharged due to being pleased with the current functional level.  Percival Spanish, PT, MPT 01/28/21, 8:38 AM  [facility] [place], [place], [address] Phone: [phone]   Fax:  [phone]

## 2021-01-05 ENCOUNTER — Encounter: Payer: PPO | Admitting: Physical Therapy

## 2021-01-06 DIAGNOSIS — Z90710 Acquired absence of both cervix and uterus: Secondary | ICD-10-CM | POA: Diagnosis not present

## 2021-01-06 DIAGNOSIS — Z1272 Encounter for screening for malignant neoplasm of vagina: Secondary | ICD-10-CM | POA: Diagnosis not present

## 2021-01-06 DIAGNOSIS — Z6839 Body mass index (BMI) 39.0-39.9, adult: Secondary | ICD-10-CM | POA: Diagnosis not present

## 2021-01-06 DIAGNOSIS — Z124 Encounter for screening for malignant neoplasm of cervix: Secondary | ICD-10-CM | POA: Diagnosis not present

## 2021-01-07 LAB — HM PAP SMEAR: HM Pap smear: NEGATIVE

## 2021-01-20 DIAGNOSIS — A630 Anogenital (venereal) warts: Secondary | ICD-10-CM | POA: Diagnosis not present

## 2021-02-02 DIAGNOSIS — M5030 Other cervical disc degeneration, unspecified cervical region: Secondary | ICD-10-CM | POA: Diagnosis not present

## 2021-02-02 DIAGNOSIS — R200 Anesthesia of skin: Secondary | ICD-10-CM | POA: Diagnosis not present

## 2021-02-02 DIAGNOSIS — M47812 Spondylosis without myelopathy or radiculopathy, cervical region: Secondary | ICD-10-CM | POA: Diagnosis not present

## 2021-02-02 DIAGNOSIS — M5412 Radiculopathy, cervical region: Secondary | ICD-10-CM | POA: Diagnosis not present

## 2021-02-04 ENCOUNTER — Encounter: Payer: Self-pay | Admitting: Family Medicine

## 2021-02-04 ENCOUNTER — Telehealth: Payer: PPO | Admitting: Physician Assistant

## 2021-02-04 DIAGNOSIS — B9689 Other specified bacterial agents as the cause of diseases classified elsewhere: Secondary | ICD-10-CM

## 2021-02-04 DIAGNOSIS — J019 Acute sinusitis, unspecified: Secondary | ICD-10-CM

## 2021-02-04 MED ORDER — AMOXICILLIN-POT CLAVULANATE 875-125 MG PO TABS: 1.0000 | ORAL_TABLET | Freq: Two times a day (BID) | ORAL | 0 refills | Status: DC

## 2021-02-04 MED ORDER — BENZONATATE 100 MG PO CAPS: 100.0000 mg | ORAL_CAPSULE | Freq: Three times a day (TID) | ORAL | 0 refills | Status: DC | PRN

## 2021-02-04 NOTE — Progress Notes (Signed)
E-Visit for Sinus Problems  We are sorry that you are not feeling well.  Here is how we plan to help!  Based on what you have shared with me it looks like you have sinusitis.  Sinusitis is inflammation and infection in the sinus cavities of the head.  Based on your presentation I believe you most likely have Acute Bacterial Sinusitis.  This is an infection caused by bacteria and is treated with antibiotics. I have prescribed Augmentin 875mg /125mg  one tablet twice daily with food, for 7 days. You may use an oral decongestant such as Mucinex D or if you have glaucoma or high blood pressure use plain Mucinex. Saline nasal spray help and can safely be used as often as needed for congestion.  I have sent in a prescription cough medication for you to use as well. If you develop worsening sinus pain, fever or notice severe headache and vision changes, or if symptoms are not better after completion of antibiotic, please schedule an appointment with a health care provider.    Sinus infections are not as easily transmitted as other respiratory infection, however we still recommend that you avoid close contact with loved ones, especially the very young and elderly.  Remember to wash your hands thoroughly throughout the day as this is the number one way to prevent the spread of infection!  Home Care: Only take medications as instructed by your medical team. Complete the entire course of an antibiotic. Do not take these medications with alcohol. A steam or ultrasonic humidifier can help congestion.  You can place a towel over your head and breathe in the steam from hot water coming from a faucet. Avoid close contacts especially the very young and the elderly. Cover your mouth when you cough or sneeze. Always remember to wash your hands.  Get Help Right Away If: You develop worsening fever or sinus pain. You develop a severe head ache or visual changes. Your symptoms persist after you have completed your  treatment plan.  Make sure you Understand these instructions. Will watch your condition. Will get help right away if you are not doing well or get worse.  Thank you for choosing an e-visit.  Your e-visit answers were reviewed by a board certified advanced clinical practitioner to complete your personal care plan. Depending upon the condition, your plan could have included both over the counter or prescription medications.  Please review your pharmacy choice. Make sure the pharmacy is open so you can pick up prescription now. If there is a problem, you may contact your provider through [employer] and have the prescription routed to another pharmacy.  Your safety is important to [name]. If you have drug allergies check your prescription carefully.   For the next 24 hours you can use MyChart to ask questions about today's visit, request a non-urgent call back, or ask for a work or school excuse. You will get an email in the next two days asking about your experience. I hope that your e-visit has been valuable and will speed your recovery.

## 2021-02-04 NOTE — Progress Notes (Signed)
I have spent 5 minutes in review of e-visit questionnaire, review and updating patient chart, medical decision making and response to patient.   Curvin Hunger Cody Jazminn Pomales, PA-C    

## 2021-03-20 ENCOUNTER — Other Ambulatory Visit: Payer: Self-pay | Admitting: Family Medicine

## 2021-03-26 DIAGNOSIS — H40033 Anatomical narrow angle, bilateral: Secondary | ICD-10-CM | POA: Diagnosis not present

## 2021-03-26 DIAGNOSIS — H5203 Hypermetropia, bilateral: Secondary | ICD-10-CM | POA: Diagnosis not present

## 2021-03-26 DIAGNOSIS — H52223 Regular astigmatism, bilateral: Secondary | ICD-10-CM | POA: Diagnosis not present

## 2021-03-26 DIAGNOSIS — H524 Presbyopia: Secondary | ICD-10-CM | POA: Diagnosis not present

## 2021-03-26 DIAGNOSIS — H40013 Open angle with borderline findings, low risk, bilateral: Secondary | ICD-10-CM | POA: Diagnosis not present

## 2021-03-26 DIAGNOSIS — H2513 Age-related nuclear cataract, bilateral: Secondary | ICD-10-CM | POA: Diagnosis not present

## 2021-03-29 ENCOUNTER — Other Ambulatory Visit: Payer: Self-pay | Admitting: Family Medicine

## 2021-03-30 MED ORDER — ESTRADIOL 2 MG PO TABS: 2.0000 mg | ORAL_TABLET | Freq: Every day | ORAL | 0 refills | Status: DC

## 2021-04-15 ENCOUNTER — Ambulatory Visit: Payer: PPO | Attending: Internal Medicine

## 2021-04-15 DIAGNOSIS — Z23 Encounter for immunization: Secondary | ICD-10-CM

## 2021-04-16 ENCOUNTER — Other Ambulatory Visit (HOSPITAL_BASED_OUTPATIENT_CLINIC_OR_DEPARTMENT_OTHER): Payer: Self-pay

## 2021-04-16 MED ORDER — MODERNA COVID-19 BIVAL BOOSTER 50 MCG/0.5ML IM SUSP
INTRAMUSCULAR | 0 refills | Status: DC
  Filled 2021-04-16: qty 0.5, 1d supply, fill #0

## 2021-04-16 NOTE — Progress Notes (Signed)
° °  Covid-19 Vaccination Clinic  Name:  Sherri Sullivan    MRN: [id number] DOB: [birth year]  04/16/2021  Sherri Sullivan was observed post Covid-19 immunization for 15 minutes without incident. She was provided with Vaccine Information Sheet and instruction to access the V-Safe system.   Sherri Sullivan was instructed to call 911 with any severe reactions post vaccine: Difficulty breathing  Swelling of face and throat  A fast heartbeat  A bad rash all over body  Dizziness and weakness   Immunizations Administered     Name Date Dose VIS Date Route   Moderna Covid-19 vaccine Bivalent Booster 04/15/2021  2:15 PM 0.5 mL 11/08/2020 Intramuscular   Manufacturer: Levan Hurst   Lot: [id number]   [place]: 49201-007-12

## 2021-04-22 ENCOUNTER — Other Ambulatory Visit: Payer: Self-pay | Admitting: Family Medicine

## 2021-06-02 ENCOUNTER — Ambulatory Visit: Payer: PPO | Admitting: Family Medicine

## 2021-06-02 ENCOUNTER — Ambulatory Visit (INDEPENDENT_AMBULATORY_CARE_PROVIDER_SITE_OTHER): Payer: PPO | Admitting: Family Medicine

## 2021-06-02 ENCOUNTER — Encounter: Payer: Self-pay | Admitting: Family Medicine

## 2021-06-02 VITALS — BP 136/71 | HR 82 | Ht 63.0 in | Wt 222.2 lb

## 2021-06-02 DIAGNOSIS — E876 Hypokalemia: Secondary | ICD-10-CM | POA: Diagnosis not present

## 2021-06-02 DIAGNOSIS — I10 Essential (primary) hypertension: Secondary | ICD-10-CM | POA: Diagnosis not present

## 2021-06-02 DIAGNOSIS — E669 Obesity, unspecified: Secondary | ICD-10-CM | POA: Diagnosis not present

## 2021-06-02 DIAGNOSIS — Z1231 Encounter for screening mammogram for malignant neoplasm of breast: Secondary | ICD-10-CM | POA: Diagnosis not present

## 2021-06-02 DIAGNOSIS — E785 Hyperlipidemia, unspecified: Secondary | ICD-10-CM | POA: Diagnosis not present

## 2021-06-02 DIAGNOSIS — E559 Vitamin D deficiency, unspecified: Secondary | ICD-10-CM

## 2021-06-02 LAB — VITAMIN D 25 HYDROXY (VIT D DEFICIENCY, FRACTURES): VITD: 62.94 ng/mL (ref 30.00–100.00)

## 2021-06-02 LAB — POTASSIUM: Potassium: 4.1 meq/L (ref 3.5–5.1)

## 2021-06-02 NOTE — Assessment & Plan Note (Addendum)
HLD PLAN: ?-Reviewed most recent lipid panel and BMP from outside paper brought in my patient - will abstract into chart  ?-Medication management: continue Lipitor ?-Diet low in saturated fat ?-Regular exercise - at least 30 minutes, 5 times per week ? ?

## 2021-06-02 NOTE — Progress Notes (Signed)
Established Patient Office Visit  Subjective:  Patient ID: Sherri Sullivan, female    DOB: 1960-04-06  Age: 61 y.o. MRN: [id number]  CC: Routine f/u   HPI SICILIA KILLOUGH presents for 68-monthfollow-up on chronic conditions.   HYPERTENSION: - Medications: Maxzide (37.5-25) 1 tablet daily;  - Compliance: good - Checking BP at home: no - Denies any SOB, recurrent headaches, CP, vision changes, LE edema, dizziness, palpitations, or medication side effects. - Diet: regular - Exercise: walking occasionally   HYPERLIPIDEMIA - medications: lipitor 10 mg daily  - compliance: good  - medication SEs: none The 10-year ASCVD risk score (Arnett DK, et al., 2019) is: 6.7%   Values used to calculate the score:     Age: [age]years     Sex: Female     Is Non-Hispanic African American: Yes     Diabetic: No     Tobacco smoker: No     Systolic Blood Pressure: 1811mmHg     Is BP treated: Yes     HDL Cholesterol: 53.5 mg/dL     Total Cholesterol: 160 mg/dL     HYPOKALEMIA Taking 20 mEq BID  Has occasional cramping/muscle spasms in hand, maybe one a week No chest pain, palpitations, dyspnea.      Past Medical History:  Diagnosis Date   Anginal pain ([place]    Arthritis    L shoulder- has had injections, degenerative changes in lumbar spine    Back pain    Chicken pox as a child   Constipation    Dizziness    Dysrhythmia 2013   SVT- treated by ablation by Dr. TLovena Leto f/u with as needed basis    Encounter for preventative adult health care exam with abnormal findings 02/09/2015   HTN (hypertension)    Hypokalemia 02/09/2015   Joint pain    Leg edema    Measles as a child   Measles as a child   Obesity    Panic attack    during episode of feeling to crowded    Preventative health care 02/09/2015   Seasonal allergies    SOB (shortness of breath) 09/23/11   "a little bit; at rest; before ablation", SOB again now (08/2014- due to lack  of exercise)    [place] ([place]ra[place]ricular  [place]achycardia) [place]    Uterine fibroid 12/17/2012   Cervical polyp per patient Follows with [facility], Dr REvette Cristal   Past Surgical History:  Procedure Laterality Date   ABDOMINAL HYSTERECTOMY  1990's   CARDIAC ELECTROPHYSIOLOGY STUDY AND ABLATION  09/23/11   MAXIMUM ACCESS (MAS)POSTERIOR LUMBAR INTERBODY FUSION (PLIF) 1 LEVEL N/A 09/24/2014   Procedure: L5-S1 MAS PLIF ;  Surgeon: JErline Levine MD;  [facility];  Service: Neurosurgery;  Laterality: N/A;  L5-S1 MAS PLIF fusion   SUPRAVENTRICULAR TACHYCARDIA ABLATION N/A 09/23/2011   Procedure: SUPRAVENTRICULAR TACHYCARDIA ABLATION;  Surgeon: GEvans Lance MD;  [facility];  Service: Cardiovascular;  Laterality: N/A;   TUBAL LIGATION  1980's    Family History  Problem Relation Age of Onset   Diabetes Mother        type 2   Heart disease Mother    Hypertension Mother    Stroke Mother    Obesity Mother    Heart disease Father    Obesity Father    Cancer Sister        lung   Hypertension Daughter    Lupus Daughter  Diabetes Maternal Aunt    Kidney disease Maternal Aunt    Diabetes Maternal Uncle    Stroke Maternal Grandmother    Breast cancer Cousin    Colon cancer Neg Hx    Esophageal cancer Neg Hx    Rectal cancer Neg Hx    Stomach cancer Neg Hx     Social History   Socioeconomic History   Marital status: Married    Spouse name: Marcello Moores   Number of children: 2   Years of education: Not on file   Highest education level: Not on file  Occupational History   Occupation: Retired  Tobacco Use   Smoking status: Former    Packs/day: 0.12    Years: 8.00    Pack years: 0.96    Types: Cigarettes    Quit date: 03/29/1984    Years since quitting: 37.2   Smokeless tobacco: Never  Vaping Use   Vaping Use: Never used  Substance and Sexual Activity   Alcohol use: Yes    Alcohol/week: 14.0 standard drinks    Types: 14 Glasses of wine per week    Comment: Drinks 2-3 glasses wine on the  weekends   Drug use: No   Sexual activity: Not Currently  Other Topics Concern   Not on file  Social History Narrative   No major restrictions   Lives with husband, daughter, grand daughter in a 2 story home.    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   Social Determinants of Health   Financial Resource Strain: Not on file  Food Insecurity: Not on file  Transportation Needs: Not on file  Physical Activity: Not on file  Stress: Not on file  Social Connections: Not on file  Intimate Partner Violence: Not on file    Outpatient Medications Prior to Visit  Medication Sig Dispense Refill   atorvastatin (LIPITOR) 10 MG tablet TAKE ONE TABLET BY MOUTH ONE TIME DAILY 90 tablet 1   diclofenac (VOLTAREN) 75 MG EC tablet Take 1 tablet (75 mg total) by mouth 2 (two) times daily. 180 tablet 3   estradiol (ESTRACE) 2 MG tablet Take 1 tablet (2 mg total) by mouth daily. 90 tablet 0   fluticasone (FLONASE) 50 MCG/ACT nasal spray Place 2 sprays into both nostrils daily. 32 g 1   methocarbamol (ROBAXIN) 500 MG tablet Take 1 tablet (500 mg total) by mouth every 6 (six) hours as needed for muscle spasms. 90 tablet 0   montelukast (SINGULAIR) 10 MG tablet Take 1 tablet (10 mg total) by mouth daily as needed. 90 tablet 3   OMEGA-3 KRILL OIL PO Take 353 mg by mouth daily. [facility]     potassium chloride SA (KLOR-CON M) 20 MEQ tablet Take 1 tablet by mouth twice daily 180 tablet 0   Probiotic Product (PROBIOTIC PO) Take 2 tablets by mouth at bedtime.     triamterene-hydrochlorothiazide (MAXZIDE-25) 37.5-25 MG tablet Take 1 tablet by mouth once daily 90 tablet 1   Wheat Dextrin (BENEFIBER) POWD Take 2-3 scoop by mouth daily as needed.     amoxicillin-clavulanate (AUGMENTIN) 875-125 MG tablet Take 1 tablet by mouth 2 (two) times daily. 14 tablet 0   benzonatate (TESSALON) 100 MG capsule Take 1 capsule (100 mg total) by mouth 3 (three) times daily as needed for  cough. 30 capsule 0   COVID-19 mRNA bivalent vaccine, Moderna, (MODERNA COVID-19 BIVAL BOOSTER) 50 MCG/0.5ML injection Inject into the muscle.  0.5 mL 0   No facility-administered medications prior to visit.    Allergies  Allergen Reactions   Doxycycline Diarrhea and Nausea And Vomiting    ROS Review of Systems All review of systems negative except what is listed in the HPI    Objective:    Physical Exam Vitals reviewed.  Constitutional:      Appearance: Normal appearance.  Cardiovascular:     Rate and Rhythm: Normal rate and regular rhythm.     Pulses: Normal pulses.  Pulmonary:     Effort: Pulmonary effort is normal.     Breath sounds: Normal breath sounds.  Musculoskeletal:     Cervical back: Normal range of motion and neck supple.     Right lower leg: No edema.     Left lower leg: No edema.  Skin:    General: Skin is warm and dry.  Neurological:     General: No focal deficit present.     Mental Status: She is alert and oriented to person, place, and time.  Psychiatric:        Mood and Affect: Mood normal.        Behavior: Behavior normal.        Thought Content: Thought content normal.        Judgment: Judgment normal.    BP 136/71    Pulse 82    Ht '5\' 3"'$  (1.6 m)    Wt 222 lb 3.2 oz (100.8 kg)    BMI 39.36 kg/m  Wt Readings from Last 3 Encounters:  06/02/21 222 lb 3.2 oz (100.8 kg)  11/27/20 224 lb (101.6 kg)  02/26/20 225 lb 12.8 oz (102.4 kg)     Health Maintenance Due  Topic Date Due   PAP SMEAR-Modifier  05/30/2021    There are no preventive care reminders to display for this patient.  Lab Results  Component Value Date   TSH 1.39 11/19/2020   Lab Results  Component Value Date   WBC 5.5 11/19/2020   HGB 13.2 11/19/2020   HCT 40.5 11/19/2020   MCV 89.8 11/19/2020   PLT 219.0 11/19/2020   Lab Results  Component Value Date   NA 139 11/19/2020   K 4.0 11/19/2020   CO2 25 11/19/2020   GLUCOSE 92 11/19/2020   BUN 16 11/19/2020    CREATININE 0.61 11/19/2020   BILITOT 1.0 11/19/2020   ALKPHOS 50 11/19/2020   AST 14 11/19/2020   ALT 13 11/19/2020   PROT 6.8 11/19/2020   ALBUMIN 3.9 11/19/2020   CALCIUM 10.3 11/19/2020   ANIONGAP 9 09/13/2014   GFR 97.19 11/19/2020   Lab Results  Component Value Date   CHOL 160 11/19/2020   Lab Results  Component Value Date   HDL 53.50 11/19/2020   Lab Results  Component Value Date   LDLCALC 82 11/19/2020   Lab Results  Component Value Date   TRIG 121.0 11/19/2020   Lab Results  Component Value Date   CHOLHDL 3 11/19/2020   Lab Results  Component Value Date   HGBA1C 5.4 11/19/2020      Assessment & Plan:    Essential hypertension, benign Blood pressure is at goal for age and co-morbidities.  I recommend continue Maxzide.  In addition they were instructed on the following: - BP goal <130/80 - monitor and log blood pressures at home - check around the same time each day in a relaxed setting - Limit salt to <2000 mg/day - Follow DASH eating plan (heart healthy diet) -  limit alcohol to 2 standard drinks per day for men and 1 per day for women - avoid tobacco products - get at least 2 hours of regular aerobic exercise weekly Patient aware of signs/symptoms requiring further/urgent evaluation. Labs done last week look good - we will abstract into chart   Hyperlipidemia HLD PLAN: -Reviewed most recent lipid panel and BMP from outside paper brought in my patient - will abstract into chart  -Medication management: continue Lipitor -Diet low in saturated fat -Regular exercise - at least 30 minutes, 5 times per week   Obesity Most recent labs stable. Continue healthy diet, portion control, daily exercise.   Vitamin D deficiency Continue supplementation. Rechecking lab today.   Orders Placed This Encounter  Procedures   MM 3D SCREEN BREAST BILATERAL    Standing Status:   Future    Standing Expiration Date:   06/03/2022    Order Specific Question:    Reason for Exam (SYMPTOM  OR DIAGNOSIS REQUIRED)    Answer:   screening    Order Specific Question:   Is the patient pregnant?    Answer:   No    Order Specific Question:   Preferred imaging location?    Answer:   [facility]   Potassium   VITAMIN D 25 Hydroxy (Vit-D Deficiency, Fractures)      Follow-up: Return in about 6 months (around 12/03/2021) for routine f/u PCP.    Terrilyn Saver, NP

## 2021-06-02 NOTE — Assessment & Plan Note (Signed)
Continue supplementation. Rechecking lab today.  ?

## 2021-06-02 NOTE — Assessment & Plan Note (Signed)
Blood pressure is at goal for age and co-morbidities.  I recommend continue Maxzide.  In addition they were instructed on the following: ?- BP goal <130/80 ?- monitor and log blood pressures at home ?- check around the same time each day in a relaxed setting ?- Limit salt to <2000 mg/day ?- Follow DASH eating plan (heart healthy diet) ?- limit alcohol to 2 standard drinks per day for men and 1 per day for women ?- avoid tobacco products ?- get at least 2 hours of regular aerobic exercise weekly ?Patient aware of signs/symptoms requiring further/urgent evaluation. ?Labs done last week look good - we will abstract into chart ? ?

## 2021-06-02 NOTE — Assessment & Plan Note (Signed)
Most recent labs stable. Continue healthy diet, portion control, daily exercise.  ?

## 2021-06-02 NOTE — Patient Instructions (Signed)
No changes to medications today. Continue working on heart healthy diet and regular physical activity. Labs you previously had look great. I will just add on Vitamin D and potassium today since those were not previously checked.  ? ?

## 2021-06-16 ENCOUNTER — Ambulatory Visit (HOSPITAL_BASED_OUTPATIENT_CLINIC_OR_DEPARTMENT_OTHER)
Admission: RE | Admit: 2021-06-16 | Discharge: 2021-06-16 | Disposition: A | Discharge status: Home / Self Care | Payer: PPO | Source: Ambulatory Visit | Attending: Family Medicine | Admitting: Family Medicine

## 2021-06-16 ENCOUNTER — Encounter (HOSPITAL_BASED_OUTPATIENT_CLINIC_OR_DEPARTMENT_OTHER): Payer: Self-pay

## 2021-06-16 ENCOUNTER — Other Ambulatory Visit: Payer: Self-pay

## 2021-06-16 DIAGNOSIS — Z1231 Encounter for screening mammogram for malignant neoplasm of breast: Secondary | ICD-10-CM | POA: Diagnosis not present

## 2021-06-29 ENCOUNTER — Other Ambulatory Visit: Payer: Self-pay | Admitting: Family Medicine

## 2021-07-07 ENCOUNTER — Other Ambulatory Visit: Payer: Self-pay | Admitting: Family Medicine

## 2021-08-03 DIAGNOSIS — M5412 Radiculopathy, cervical region: Secondary | ICD-10-CM | POA: Diagnosis not present

## 2021-08-03 DIAGNOSIS — R200 Anesthesia of skin: Secondary | ICD-10-CM | POA: Diagnosis not present

## 2021-08-03 DIAGNOSIS — M5030 Other cervical disc degeneration, unspecified cervical region: Secondary | ICD-10-CM | POA: Diagnosis not present

## 2021-08-03 DIAGNOSIS — M542 Cervicalgia: Secondary | ICD-10-CM | POA: Diagnosis not present

## 2021-08-10 ENCOUNTER — Other Ambulatory Visit: Payer: Self-pay | Admitting: Family Medicine

## 2021-08-12 DIAGNOSIS — G5603 Carpal tunnel syndrome, bilateral upper limbs: Secondary | ICD-10-CM | POA: Diagnosis not present

## 2021-08-12 DIAGNOSIS — Z6841 Body Mass Index (BMI) 40.0 and over, adult: Secondary | ICD-10-CM | POA: Diagnosis not present

## 2021-09-04 ENCOUNTER — Other Ambulatory Visit: Payer: Self-pay | Admitting: Family Medicine

## 2021-09-11 DIAGNOSIS — G5601 Carpal tunnel syndrome, right upper limb: Secondary | ICD-10-CM | POA: Diagnosis not present

## 2021-09-11 DIAGNOSIS — G5612 Other lesions of median nerve, left upper limb: Secondary | ICD-10-CM | POA: Diagnosis not present

## 2021-09-11 DIAGNOSIS — G5603 Carpal tunnel syndrome, bilateral upper limbs: Secondary | ICD-10-CM | POA: Diagnosis not present

## 2021-09-24 DIAGNOSIS — H2513 Age-related nuclear cataract, bilateral: Secondary | ICD-10-CM | POA: Diagnosis not present

## 2021-09-24 DIAGNOSIS — H40033 Anatomical narrow angle, bilateral: Secondary | ICD-10-CM | POA: Diagnosis not present

## 2021-09-24 DIAGNOSIS — H40013 Open angle with borderline findings, low risk, bilateral: Secondary | ICD-10-CM | POA: Diagnosis not present

## 2021-09-28 DIAGNOSIS — G5602 Carpal tunnel syndrome, left upper limb: Secondary | ICD-10-CM | POA: Diagnosis not present

## 2021-09-28 DIAGNOSIS — G5601 Carpal tunnel syndrome, right upper limb: Secondary | ICD-10-CM | POA: Diagnosis not present

## 2021-09-30 ENCOUNTER — Other Ambulatory Visit: Payer: Self-pay | Admitting: Family Medicine

## 2021-10-22 DIAGNOSIS — K5900 Constipation, unspecified: Secondary | ICD-10-CM | POA: Diagnosis not present

## 2021-10-22 DIAGNOSIS — N959 Unspecified menopausal and perimenopausal disorder: Secondary | ICD-10-CM | POA: Diagnosis not present

## 2021-10-22 DIAGNOSIS — M1990 Unspecified osteoarthritis, unspecified site: Secondary | ICD-10-CM | POA: Diagnosis not present

## 2021-10-22 DIAGNOSIS — E785 Hyperlipidemia, unspecified: Secondary | ICD-10-CM | POA: Diagnosis not present

## 2021-10-22 DIAGNOSIS — I10 Essential (primary) hypertension: Secondary | ICD-10-CM | POA: Diagnosis not present

## 2021-10-22 DIAGNOSIS — E876 Hypokalemia: Secondary | ICD-10-CM | POA: Diagnosis not present

## 2021-10-22 DIAGNOSIS — G8929 Other chronic pain: Secondary | ICD-10-CM | POA: Diagnosis not present

## 2021-10-22 DIAGNOSIS — J309 Allergic rhinitis, unspecified: Secondary | ICD-10-CM | POA: Diagnosis not present

## 2021-10-22 DIAGNOSIS — D509 Iron deficiency anemia, unspecified: Secondary | ICD-10-CM | POA: Diagnosis not present

## 2021-10-22 DIAGNOSIS — E559 Vitamin D deficiency, unspecified: Secondary | ICD-10-CM | POA: Diagnosis not present

## 2021-10-22 DIAGNOSIS — Z87891 Personal history of nicotine dependence: Secondary | ICD-10-CM | POA: Diagnosis not present

## 2021-10-25 ENCOUNTER — Other Ambulatory Visit: Payer: Self-pay | Admitting: Family Medicine

## 2021-10-31 ENCOUNTER — Other Ambulatory Visit: Payer: Self-pay | Admitting: Family Medicine

## 2021-12-02 NOTE — Assessment & Plan Note (Signed)
hgba1c acceptable, minimize simple carbs. Increase exercise as tolerated.  

## 2021-12-02 NOTE — Assessment & Plan Note (Addendum)
Well controlled, no changes to meds. Encouraged heart healthy diet such as the DASH diet and exercise as tolerated. Given flu shot 

## 2021-12-02 NOTE — Assessment & Plan Note (Signed)
Hydrate and monitor 

## 2021-12-02 NOTE — Assessment & Plan Note (Signed)
Supplement and monitor 

## 2021-12-02 NOTE — Assessment & Plan Note (Signed)
Encourage heart healthy diet such as MIND or DASH diet, increase exercise, avoid trans fats, simple carbohydrates and processed foods, consider a krill or fish or flaxseed oil cap daily. Tolerating Atorvastatin 

## 2021-12-03 ENCOUNTER — Ambulatory Visit (INDEPENDENT_AMBULATORY_CARE_PROVIDER_SITE_OTHER): Payer: PPO | Admitting: Family Medicine

## 2021-12-03 ENCOUNTER — Encounter: Payer: Self-pay | Admitting: Family Medicine

## 2021-12-03 VITALS — BP 132/70 | HR 90 | Temp 97.8°F | Resp 16 | Ht 63.0 in | Wt 223.8 lb

## 2021-12-03 DIAGNOSIS — R739 Hyperglycemia, unspecified: Secondary | ICD-10-CM

## 2021-12-03 DIAGNOSIS — I10 Essential (primary) hypertension: Secondary | ICD-10-CM | POA: Diagnosis not present

## 2021-12-03 DIAGNOSIS — E559 Vitamin D deficiency, unspecified: Secondary | ICD-10-CM

## 2021-12-03 DIAGNOSIS — Z23 Encounter for immunization: Secondary | ICD-10-CM | POA: Diagnosis not present

## 2021-12-03 DIAGNOSIS — Z8616 Personal history of COVID-19: Secondary | ICD-10-CM

## 2021-12-03 DIAGNOSIS — E785 Hyperlipidemia, unspecified: Secondary | ICD-10-CM

## 2021-12-03 DIAGNOSIS — R252 Cramp and spasm: Secondary | ICD-10-CM

## 2021-12-03 NOTE — Patient Instructions (Addendum)
PURE diet NOW company Ber[employer]ine capsules ?  Multivitamin with minerals  Vitamin D 2000 New covid booster late September or early october   Recommend calcium intake of 1200 to 1500 mg daily, divided into roughly 3 doses. Best source is the diet and a single dairy serving is about 500 mg, a supplement of calcium citrate once or twice daily to balance diet is fine if not getting enough in diet. Also need Vitamin D 2000 IU caps, 1 cap daily if not already taking vitamin D. Also recommend weight baring exercise on hips and upper body to keep bones strong

## 2021-12-03 NOTE — Progress Notes (Signed)
Subjective:   By signing my name below, I, Sherri Sullivan, attest that this documentation has been prepared under the direction and in the presence of Sherri Lukes, MD 12/03/2021.    Patient ID: Sherri Sullivan, female    DOB: 1960-09-10, 61 y.o.   MRN: [id number]  No chief complaint on file.  HPI Patient is in today for an office visit and is doing well.  COVID-19: She suspects that she had COVID-19 last month but was unable to test herself. She reports that she had chills, diarrhea, fatigue, and a headache and that these symptoms lasted 3 weeks.   Immunizations: She is up to date on Shingles and Tetanus immunizations. She has been informed about receiving COVID-19 and Flu immunizations.   Appetite: She is inquiring about an otc supplement to suppress her appetite.  Supplements: She currently takes calcium, vitamin D, and zinc supplements. She is interesting in taking a multivitamin.   Blood pressure: Her blood pressure is within normal range today.  BP Readings from Last 3 Encounters:  12/03/21 132/70  06/02/21 136/71  11/27/20 120/72   Past Medical History:  Diagnosis Date   Anginal pain (HCC)    Arthritis    L shoulder- has had injections, degenerative changes in lumbar spine    Back pain    Chicken pox as a child   Constipation    Dizziness    Dysrhythmia 2013   SVT- treated by ablation by Dr. Lovena Le to f/u with as needed basis    Encounter for preventative adult health care exam with abnormal findings 02/09/2015   HTN (hypertension)    Hypokalemia 02/09/2015   Joint pain    Leg edema    Measles as a child   Measles as a child   Obesity    Panic attack    during episode of feeling to crowded    Preventative health care 02/09/2015   Seasonal allergies    SOB (shortness of breath) 09/23/11   "a little bit; at rest; before ablation", SOB again now (08/2014- due to lack  of exercise)    SVT (supraventricular tachycardia) ([place])    Uterine fibroid 12/17/2012    Cervical polyp per patient Follows with [facility], Dr Evette Cristal   Past Surgical History:  Procedure Laterality Date   ABDOMINAL HYSTERECTOMY  1990's   CARDIAC ELECTROPHYSIOLOGY STUDY AND ABLATION  09/23/11   MAXIMUM ACCESS (MAS)POSTERIOR LUMBAR INTERBODY FUSION (PLIF) 1 LEVEL N/A 09/24/2014   Procedure: L5-S1 MAS PLIF ;  Surgeon: Erline Levine, MD;  Location: [facility];  Service: Neurosurgery;  Laterality: N/A;  L5-S1 MAS PLIF fusion   SUPRAVENTRICULAR TACHYCARDIA ABLATION N/A 09/23/2011   Procedure: SUPRAVENTRICULAR TACHYCARDIA ABLATION;  Surgeon: Evans Lance, MD;  Location: [facility] CATH LAB;  Service: Cardiovascular;  Laterality: N/A;   TUBAL LIGATION  1980's   Family History  Problem Relation Age of Onset   Diabetes Mother        type 2   Heart disease Mother    Hypertension Mother    Stroke Mother    Obesity Mother    Heart disease Father    Obesity Father    Cancer Sister        lung   Hypertension Daughter    Lupus Daughter    Diabetes Maternal Aunt    Kidney disease Maternal Aunt    Diabetes Maternal Uncle    Stroke Maternal Grandmother    Breast cancer Cousin    Colon cancer  Neg Hx    Esophageal cancer Neg Hx    Rectal cancer Neg Hx    Stomach cancer Neg Hx    Social History   Socioeconomic History   Marital status: Married    Spouse name: Sherri Sullivan   Number of children: 2   Years of education: Not on file   Highest education level: Not on file  Occupational History   Occupation: Retired  Tobacco Use   Smoking status: Former    Packs/day: 0.12    Years: 8.00    Total pack years: 0.96    Types: Cigarettes    Quit date: 03/29/1984    Years since quitting: 37.7   Smokeless tobacco: Never  Vaping Use   Vaping Use: Never used  Substance and Sexual Activity   Alcohol use: Yes    Alcohol/week: 14.0 standard drinks of alcohol    Types: 14 Glasses of wine per week    Comment: Drinks 2-3 glasses wine on the weekends   Drug use: No   Sexual activity:  Not Currently  Other Topics Concern   Not on file  Social History Narrative   No major restrictions   Lives with husband, daughter, grand daughter in a 2 story home.    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   Social Determinants of Health   Financial Resource Strain: Not on file  Food Insecurity: Not on file  Transportation Needs: Not on file  Physical Activity: Not on file  Stress: Not on file  Social Connections: Not on file  Intimate Partner Violence: Not on file   Outpatient Medications Prior to Visit  Medication Sig Dispense Refill   atorvastatin (LIPITOR) 10 MG tablet TAKE ONE TABLET BY MOUTH ONE TIME DAILY 90 tablet 0   diclofenac (VOLTAREN) 75 MG EC tablet Take 1 tablet by mouth twice daily 180 tablet 0   estradiol (ESTRACE) 2 MG tablet Take 1 tablet by mouth once daily 90 tablet 1   fluticasone (FLONASE) 50 MCG/ACT nasal spray Use 2 spray(s) in each nostril once daily 32 g 0   methocarbamol (ROBAXIN) 500 MG tablet Take 1 tablet (500 mg total) by mouth every 6 (six) hours as needed for muscle spasms. 90 tablet 0   montelukast (SINGULAIR) 10 MG tablet Take 1 tablet (10 mg total) by mouth daily as needed. 90 tablet 3   OMEGA-3 KRILL OIL PO Take 353 mg by mouth daily. [facility]     potassium chloride SA (KLOR-CON M) 20 MEQ tablet TAKE 1  BY MOUTH TWICE DAILY 180 tablet 1   Probiotic Product (PROBIOTIC PO) Take 2 tablets by mouth at bedtime.     triamterene-hydrochlorothiazide (MAXZIDE-25) 37.5-25 MG tablet Take 1 tablet by mouth once daily 90 tablet 1   Wheat Dextrin (BENEFIBER) POWD Take 2-3 scoop by mouth daily as needed.     No facility-administered medications prior to visit.   Allergies  Allergen Reactions   Doxycycline Diarrhea and Nausea And Vomiting   ROS     Objective:    Physical Exam Constitutional:      General: She is not in acute distress.    Appearance: Normal appearance. She is not ill-appearing.  HENT:      Head: Normocephalic and atraumatic.     Right Ear: External ear normal.     Left Ear: External ear normal.     Mouth/Throat:     Mouth: Mucous membranes are moist.  Pharynx: Oropharynx is clear.  Eyes:     Extraocular Movements: Extraocular movements intact.     Pupils: Pupils are equal, round, and reactive to light.  Cardiovascular:     Rate and Rhythm: Normal rate and regular rhythm.     Pulses: Normal pulses.     Heart sounds: Normal heart sounds. No murmur heard.    No gallop.  Pulmonary:     Effort: Pulmonary effort is normal. No respiratory distress.     Breath sounds: Normal breath sounds. No wheezing or rales.  Abdominal:     General: Bowel sounds are normal.  Skin:    General: Skin is warm and dry.  Neurological:     Mental Status: She is alert and oriented to person, place, and time.  Psychiatric:        Mood and Affect: Mood normal.        Behavior: Behavior normal.        Judgment: Judgment normal.    There were no vitals taken for this visit. Wt Readings from Last 3 Encounters:  06/02/21 222 lb 3.2 oz (100.8 kg)  11/27/20 224 lb (101.6 kg)  02/26/20 225 lb 12.8 oz (102.4 kg)   Diabetic Foot Exam - Simple   No data filed    Lab Results  Component Value Date   WBC 5.5 11/19/2020   HGB 13.2 11/19/2020   HCT 40.5 11/19/2020   PLT 219.0 11/19/2020   GLUCOSE 92 11/19/2020   CHOL 160 11/19/2020   TRIG 121.0 11/19/2020   HDL 53.50 11/19/2020   LDLDIRECT 143.0 08/29/2017   LDLCALC 82 11/19/2020   ALT 13 11/19/2020   AST 14 11/19/2020   NA 139 11/19/2020   K 4.1 06/02/2021   CL 105 11/19/2020   CREATININE 0.61 11/19/2020   BUN 16 11/19/2020   CO2 25 11/19/2020   TSH 1.39 11/19/2020   HGBA1C 5.4 11/19/2020   Lab Results  Component Value Date   TSH 1.39 11/19/2020   Lab Results  Component Value Date   WBC 5.5 11/19/2020   HGB 13.2 11/19/2020   HCT 40.5 11/19/2020   MCV 89.8 11/19/2020   PLT 219.0 11/19/2020   Lab Results  Component  Value Date   NA 139 11/19/2020   K 4.1 06/02/2021   CO2 25 11/19/2020   GLUCOSE 92 11/19/2020   BUN 16 11/19/2020   CREATININE 0.61 11/19/2020   BILITOT 1.0 11/19/2020   ALKPHOS 50 11/19/2020   AST 14 11/19/2020   ALT 13 11/19/2020   PROT 6.8 11/19/2020   ALBUMIN 3.9 11/19/2020   CALCIUM 10.3 11/19/2020   ANIONGAP 9 09/13/2014   GFR 97.19 11/19/2020   Lab Results  Component Value Date   CHOL 160 11/19/2020   Lab Results  Component Value Date   HDL 53.50 11/19/2020   Lab Results  Component Value Date   LDLCALC 82 11/19/2020   Lab Results  Component Value Date   TRIG 121.0 11/19/2020   Lab Results  Component Value Date   CHOLHDL 3 11/19/2020   Lab Results  Component Value Date   HGBA1C 5.4 11/19/2020      Assessment & Plan:   Problem List Items Addressed This Visit       Cardiovascular and Mediastinum   Essential hypertension, benign     Other   Hyperlipidemia   Muscle cramps   Vitamin D deficiency   Hyperglycemia   No orders of the defined types were placed in this encounter.  I,  Sherri Sullivan, personally preformed the services described in this documentation.  All medical record entries made by the scribe were at my direction and in my presence.  I have reviewed the chart and discharge instructions (if applicable) and agree that the record reflects my personal performance and is accurate and complete. 12/03/2021  I,Mohammed Iqbal,acting as a scribe for Penni Homans, MD.,have documented all relevant documentation on the behalf of Penni Homans, MD,as directed by  Penni Homans, MD while in the presence of Penni Homans, MD.  Sherri Sullivan

## 2021-12-04 LAB — MAGNESIUM: Magnesium: 1.7 mg/dL (ref 1.5–2.5)

## 2021-12-04 LAB — VITAMIN D 25 HYDROXY (VIT D DEFICIENCY, FRACTURES): VITD: 47.3 ng/mL (ref 30.00–100.00)

## 2021-12-04 LAB — CBC
HCT: 40.3 % (ref 36.0–46.0)
Hemoglobin: 13.2 g/dL (ref 12.0–15.0)
MCHC: 32.7 g/dL (ref 30.0–36.0)
MCV: 90.4 fl (ref 78.0–100.0)
Platelets: 223 10*3/uL (ref 150.0–400.0)
RBC: 4.46 Mil/uL (ref 3.87–5.11)
RDW: 17 % — ABNORMAL HIGH (ref 11.5–15.5)
WBC: 6.6 10*3/uL (ref 4.0–10.5)

## 2021-12-04 LAB — TSH: TSH: 0.96 u[IU]/mL (ref 0.35–5.50)

## 2021-12-04 LAB — LIPID PANEL
Cholesterol: 174 mg/dL (ref 0–200)
HDL: 61.1 mg/dL (ref >39.00)
LDL Cholesterol: 84 mg/dL (ref 0–99)
NonHDL: 112.8
Total CHOL/HDL Ratio: 3
Triglycerides: 142 mg/dL (ref 0.0–149.0)
VLDL: 28.4 mg/dL (ref 0.0–40.0)

## 2021-12-04 LAB — COMPREHENSIVE METABOLIC PANEL
ALT: 11 U/L (ref 0–35)
AST: 12 U/L (ref 0–37)
Albumin: 3.9 g/dL (ref 3.5–5.2)
Alkaline Phosphatase: 52 U/L (ref 39–117)
BUN: 13 mg/dL (ref 6–23)
CO2: 26 mEq/L (ref 19–32)
Calcium: 10.4 mg/dL (ref 8.4–10.5)
Chloride: 105 mEq/L (ref 96–112)
Creatinine, Ser: 0.7 mg/dL (ref 0.40–1.20)
GFR: 93.33 mL/min (ref >60.00)
Glucose, Bld: 88 mg/dL (ref 70–99)
Potassium: 4.1 mEq/L (ref 3.5–5.1)
Sodium: 139 mEq/L (ref 135–145)
Total Bilirubin: 1 mg/dL (ref 0.2–1.2)
Total Protein: 6.9 g/dL (ref 6.0–8.3)

## 2021-12-04 LAB — HEMOGLOBIN A1C: Hgb A1c MFr Bld: 5.5 % (ref 4.6–6.5)

## 2021-12-06 DIAGNOSIS — Z8616 Personal history of COVID-19: Secondary | ICD-10-CM | POA: Insufficient documentation

## 2021-12-06 NOTE — Assessment & Plan Note (Signed)
She struggled with fatigue, chills and headaches but symptoms have fully resolved.

## 2021-12-10 ENCOUNTER — Telehealth: Payer: Self-pay | Admitting: Family Medicine

## 2021-12-10 NOTE — Telephone Encounter (Signed)
Left message for patient to call back and schedule Medicare Annual Wellness Visit (AWV).   Please offer to do virtually or by telephone.  Left office number and my jabber [phone].  Welcome to Medicare: 11/27/2020  Please schedule at anytime with Nurse Health Advisor.

## 2021-12-13 ENCOUNTER — Other Ambulatory Visit: Payer: Self-pay | Admitting: Family Medicine

## 2021-12-25 ENCOUNTER — Other Ambulatory Visit: Payer: Self-pay | Admitting: Family Medicine

## 2022-01-06 ENCOUNTER — Other Ambulatory Visit: Payer: Self-pay | Admitting: Family Medicine

## 2022-01-07 MED ORDER — METHOCARBAMOL 500 MG PO TABS
500.0000 mg | ORAL_TABLET | Freq: Four times a day (QID) | ORAL | 0 refills | Status: AC | PRN
Start: 2022-01-07 — End: ?

## 2022-01-23 ENCOUNTER — Other Ambulatory Visit: Payer: Self-pay | Admitting: Family Medicine

## 2022-01-26 ENCOUNTER — Other Ambulatory Visit: Payer: Self-pay | Admitting: Family Medicine

## 2022-01-29 DIAGNOSIS — Z01419 Encounter for gynecological examination (general) (routine) without abnormal findings: Secondary | ICD-10-CM | POA: Diagnosis not present

## 2022-01-29 DIAGNOSIS — Z6839 Body mass index (BMI) 39.0-39.9, adult: Secondary | ICD-10-CM | POA: Diagnosis not present

## 2022-01-29 DIAGNOSIS — N959 Unspecified menopausal and perimenopausal disorder: Secondary | ICD-10-CM | POA: Diagnosis not present

## 2022-01-29 DIAGNOSIS — N952 Postmenopausal atrophic vaginitis: Secondary | ICD-10-CM | POA: Diagnosis not present

## 2022-02-17 DIAGNOSIS — Z6839 Body mass index (BMI) 39.0-39.9, adult: Secondary | ICD-10-CM | POA: Diagnosis not present

## 2022-02-17 DIAGNOSIS — G5601 Carpal tunnel syndrome, right upper limb: Secondary | ICD-10-CM | POA: Diagnosis not present

## 2022-02-25 ENCOUNTER — Telehealth: Payer: PPO | Admitting: Family Medicine

## 2022-02-25 DIAGNOSIS — J019 Acute sinusitis, unspecified: Secondary | ICD-10-CM

## 2022-02-25 DIAGNOSIS — B9689 Other specified bacterial agents as the cause of diseases classified elsewhere: Secondary | ICD-10-CM

## 2022-02-25 MED ORDER — PREDNISONE 20 MG PO TABS: 40.0000 mg | ORAL_TABLET | Freq: Every day | ORAL | 0 refills | Status: AC

## 2022-02-25 MED ORDER — ALBUTEROL SULFATE HFA 108 (90 BASE) MCG/ACT IN AERS: 1.0000 | INHALATION_SPRAY | Freq: Four times a day (QID) | RESPIRATORY_TRACT | 0 refills | Status: DC | PRN

## 2022-02-25 MED ORDER — BENZONATATE 100 MG PO CAPS: 200.0000 mg | ORAL_CAPSULE | Freq: Two times a day (BID) | ORAL | 0 refills | Status: DC | PRN

## 2022-02-25 MED ORDER — AMOXICILLIN-POT CLAVULANATE 875-125 MG PO TABS: 1.0000 | ORAL_TABLET | Freq: Two times a day (BID) | ORAL | 0 refills | Status: AC

## 2022-02-25 NOTE — Progress Notes (Signed)
Virtual Visit Consent   Sherri Sullivan, you are scheduled for a virtual visit with a [facility] provider today. Just as with appointments in the office, your consent must be obtained to participate. Your consent will be active for this visit and any virtual visit you may have with one of our providers in the next 365 days. If you have a MyChart account, a copy of this consent can be sent to you electronically.  As this is a virtual visit, video technology does not allow for your provider to perform a traditional examination. This may limit your provider's ability to fully assess your condition. If your provider identifies any concerns that need to be evaluated in person or the need to arrange testing (such as labs, EKG, etc.), we will make arrangements to do so. Although advances in technology are sophisticated, we cannot ensure that it will always work on either your end or our end. If the connection with a video visit is poor, the visit may have to be switched to a telephone visit. With either a video or telephone visit, we are not always able to ensure that we have a secure connection.  By engaging in this virtual visit, you consent to the provision of healthcare and authorize for your insurance to be billed (if applicable) for the services provided during this visit. Depending on your insurance coverage, you may receive a charge related to this service.  I need to obtain your verbal consent now. Are you willing to proceed with your visit today? Sherri Sullivan has provided verbal consent on 02/25/2022 for a virtual visit (video or telephone). Sherri Mayo, NP  Date: 02/25/2022 9:13 AM  Virtual Visit via Video Note   I, Sherri Sullivan, connected with  Sherri Sullivan  ([id number], 30-Apr-1960) on 02/25/22 at 10:00 AM EST by a video-enabled telemedicine application and verified that I am speaking with the correct person using two identifiers.  Location: Patient: Virtual Visit Location Patient:  Home Provider: Virtual Visit Location Provider: Home Office   I discussed the limitations of evaluation and management by telemedicine and the availability of in person appointments. The patient expressed understanding and agreed to proceed.    History of Present Illness: Sherri Sullivan is a 61 y.o. who identifies as a female who was assigned female at birth, and is being seen today for cough- causing a burning sensation in center of her chest and congestion- started 3 days ago. Did have a runny nose for a few weeks prior. Reports associated symptoms of headache, mucus-nasal yellow in color, itching tongue, sore throat.   Denies fevers, chills, shortness of breath, active chest pain- aside from burning.  Problems:  Patient Active Problem List   Diagnosis Date Noted   History of COVID-19 12/06/2021   Dyspnea 11/27/2020   DDD (degenerative disc disease), cervical 11/27/2020   Chest pain 01/18/2019   Acute left-sided thoracic back pain 01/18/2019   Hyperglycemia 01/09/2018   Colon polyp 08/29/2017   Vitamin D deficiency 02/25/2017   Hypokalemia 02/09/2015   Encounter for preventative adult health care exam with abnormal findings 02/09/2015   Welcome to Medicare preventive visit 02/09/2015   Spondylolisthesis of lumbar region 09/24/2014   Neck pain 04/08/2014   Muscle cramps 01/13/2014   Tendinopathy of rotator cuff 11/28/2013   Essential hypertension, benign 11/28/2013   Urinary frequency 08/17/2013   Uterine fibroid 12/17/2012   Obesity    SVT (supraventricular tachycardia) 09/21/2011   LOW BACK PAIN 01/04/2008  Hyperlipidemia 02/28/2007   Allergic rhinitis 02/28/2007   KNEE PAIN, LEFT 02/28/2007    Allergies:  Allergies  Allergen Reactions   Doxycycline Diarrhea and Nausea And Vomiting   Medications:  Current Outpatient Medications:    atorvastatin (LIPITOR) 10 MG tablet, TAKE ONE TABLET BY MOUTH ONE TIME DAILY, Disp: 90 tablet, Rfl: 1   diclofenac (VOLTAREN) 75 MG EC  tablet, Take 1 tablet by mouth twice daily, Disp: 180 tablet, Rfl: 0   estradiol (ESTRACE) 2 MG tablet, Take 1 tablet by mouth once daily, Disp: 90 tablet, Rfl: 0   fluticasone (FLONASE) 50 MCG/ACT nasal spray, Use 2 spray(s) in each nostril once daily, Disp: 32 g, Rfl: 0   methocarbamol (ROBAXIN) 500 MG tablet, Take 1 tablet (500 mg total) by mouth every 6 (six) hours as needed for muscle spasms., Disp: 90 tablet, Rfl: 0   montelukast (SINGULAIR) 10 MG tablet, Take 1 tablet (10 mg total) by mouth daily as needed., Disp: 90 tablet, Rfl: 3   OMEGA-3 KRILL OIL PO, Take 353 mg by mouth daily. [facility], Disp: , Rfl:    potassium chloride SA (KLOR-CON M20) 20 MEQ tablet, Take 1 tablet (20 mEq total) by mouth 2 (two) times daily., Disp: 180 tablet, Rfl: 1   Probiotic Product (PROBIOTIC PO), Take 2 tablets by mouth at bedtime., Disp: , Rfl:    triamterene-hydrochlorothiazide (MAXZIDE-25) 37.5-25 MG tablet, Take 1 tablet by mouth once daily, Disp: 90 tablet, Rfl: 1   Wheat Dextrin (BENEFIBER) POWD, Take 2-3 scoop by mouth daily as needed., Disp: , Rfl:   Observations/Objective: Patient is well-developed, well-nourished in no acute distress.  Resting comfortably  at home.  Head is normocephalic, atraumatic.  No labored breathing.  Speech is clear and coherent with logical content.  Patient is alert and oriented at baseline.  Cough present Nasal tone  Assessment and Plan:  1. Acute bacterial sinusitis  - albuterol (VENTOLIN HFA) 108 (90 Base) MCG/ACT inhaler; Inhale 1-2 puffs into the lungs every 6 (six) hours as needed for wheezing or shortness of breath.  Dispense: 8 g; Refill: 0 - benzonatate (TESSALON) 100 MG capsule; Take 2 capsules (200 mg total) by mouth 2 (two) times daily as needed for cough.  Dispense: 20 capsule; Refill: 0 - amoxicillin-clavulanate (AUGMENTIN) 875-125 MG tablet; Take 1 tablet by mouth 2 (two) times daily for 7 days.  Dispense: 14 tablet; Refill: 0 - predniSONE  (DELTASONE) 20 MG tablet; Take 2 tablets (40 mg total) by mouth daily with breakfast for 3 days.  Dispense: 6 tablet; Refill: 0  COVID negative test during visit -given duration of URI signs related to allergies and virus will treat with ABX  -Take meds as prescribed -Rest -Flonase -Use a cool mist humidifier especially during the winter months when heat dries out the air. - Use saline nose sprays frequently to help soothe nasal passages and promote drainage. -Saline irrigations of the nose can be very helpful if done frequently.             * 4X daily for 1 week*             * Use of a nettie pot can be helpful with this.  *Follow directions with this* *Boiled or distilled water only -stay hydrated by drinking plenty of fluids - Keep thermostat turn down low to prevent drying out sinuses - For any cough or congestion- robitussin DM or Delsym as needed - For fever or aches or pains- take tylenol or  ibuprofen as directed on bottle             * for fevers greater than 101 orally you may alternate ibuprofen and tylenol every 3 hours.  If you do not improve you will need a follow up visit in person.                Reviewed side effects, risks and benefits of medication.    Patient acknowledged agreement and understanding of the plan.   Past Medical, Surgical, Social History, Allergies, and Medications have been Reviewed.   Follow Up Instructions: I discussed the assessment and treatment plan with the patient. The patient was provided an opportunity to ask questions and all were answered. The patient agreed with the plan and demonstrated an understanding of the instructions.  A copy of instructions were sent to the patient via MyChart unless otherwise noted below.    The patient was advised to call back or seek an in-person evaluation if the symptoms worsen or if the condition fails to improve as anticipated.  Time:  I spent 10 minutes with the patient via telehealth technology  discussing the above problems/concerns.    Sherri Mayo, NP

## 2022-02-25 NOTE — Patient Instructions (Addendum)
Alean Rinne, thank you for joining Perlie Mayo, NP for today's virtual visit.  While this provider is not your primary care provider (PCP), if your PCP is located in our provider database this encounter information will be shared with them immediately following your visit.   [facility] account gives you access to today's visit and all your visits, tests, and labs performed at [facility] " click here if you don't have a [facility] account or go to [email or web address]  Consent: (Patient) Sherri Sullivan provided verbal consent for this virtual visit at the beginning of the encounter.  Current Medications:  Current Outpatient Medications:    atorvastatin (LIPITOR) 10 MG tablet, TAKE ONE TABLET BY MOUTH ONE TIME DAILY, Disp: 90 tablet, Rfl: 1   diclofenac (VOLTAREN) 75 MG EC tablet, Take 1 tablet by mouth twice daily, Disp: 180 tablet, Rfl: 0   estradiol (ESTRACE) 2 MG tablet, Take 1 tablet by mouth once daily, Disp: 90 tablet, Rfl: 0   fluticasone (FLONASE) 50 MCG/ACT nasal spray, Use 2 spray(s) in each nostril once daily, Disp: 32 g, Rfl: 0   methocarbamol (ROBAXIN) 500 MG tablet, Take 1 tablet (500 mg total) by mouth every 6 (six) hours as needed for muscle spasms., Disp: 90 tablet, Rfl: 0   montelukast (SINGULAIR) 10 MG tablet, Take 1 tablet (10 mg total) by mouth daily as needed., Disp: 90 tablet, Rfl: 3   OMEGA-3 KRILL OIL PO, Take 353 mg by mouth daily. [facility], Disp: , Rfl:    potassium chloride SA (KLOR-CON M20) 20 MEQ tablet, Take 1 tablet (20 mEq total) by mouth 2 (two) times daily., Disp: 180 tablet, Rfl: 1   Probiotic Product (PROBIOTIC PO), Take 2 tablets by mouth at bedtime., Disp: , Rfl:    triamterene-hydrochlorothiazide (MAXZIDE-25) 37.5-25 MG tablet, Take 1 tablet by mouth once daily, Disp: 90 tablet, Rfl: 1   Wheat Dextrin (BENEFIBER) POWD, Take 2-3 scoop by mouth daily as needed., Disp: , Rfl:    Medications ordered in this  encounter:  No orders of the defined types were placed in this encounter.    *If you need refills on other medications prior to your next appointment, please contact your pharmacy*  Follow-Up: Call back or seek an in-person evaluation if the symptoms worsen or if the condition fails to improve as anticipated.  [facility] [phone]  Other Instructions -Take meds as prescribed -Rest -Flonase -Use a cool mist humidifier especially during the winter months when heat dries out the air. - Use saline nose sprays frequently to help soothe nasal passages and promote drainage. -Saline irrigations of the nose can be very helpful if done frequently.             * 4X daily for 1 week*             * Use of a nettie pot can be helpful with this.  *Follow directions with this* *Boiled or distilled water only -stay hydrated by drinking plenty of fluids - Keep thermostat turn down low to prevent drying out sinuses - For any cough or congestion- robitussin DM or Delsym as needed - For fever or aches or pains- take tylenol or ibuprofen as directed on bottle             * for fevers greater than 101 orally you may alternate ibuprofen and tylenol every 3 hours.  If you do not improve you will need a follow  up visit in person.                If you have been instructed to have an in-person evaluation today at a local Urgent Care facility, please use the link below. It will take you to a list of all of our available [facility], including address, phone number and hours of operation. Please do not delay care.  [facility]  If you or a family member do not have a primary care provider, use the link below to schedule a visit and establish care. When you choose a [facility] primary care physician or advanced practice provider, you gain a long-term partner in health. Find a Primary Care Provider  Learn more about [facility]'s in-office and virtual care  options: [facility]

## 2022-02-26 ENCOUNTER — Other Ambulatory Visit: Payer: Self-pay | Admitting: Family Medicine

## 2022-02-28 ENCOUNTER — Encounter: Payer: Self-pay | Admitting: Family Medicine

## 2022-03-17 ENCOUNTER — Ambulatory Visit (INDEPENDENT_AMBULATORY_CARE_PROVIDER_SITE_OTHER): Payer: PPO

## 2022-03-17 VITALS — Wt 223.0 lb

## 2022-03-17 DIAGNOSIS — Z0000 Encounter for general adult medical examination without abnormal findings: Secondary | ICD-10-CM

## 2022-03-17 NOTE — Patient Instructions (Signed)
[name] , Thank you for taking time to come for your Medicare Wellness Visit. I appreciate your ongoing commitment to your health goals. Please review the following plan we discussed and let me know if I can assist you in the future.   These are the goals we discussed:  Goals      Patient Stated     Lose some weight         This is a list of the screening recommended for you and due dates:  Health Maintenance  Topic Date Due   Pap Smear  05/30/2021   COVID-19 Vaccine (6 - 2023-24 season) 11/27/2021   Colon Cancer Screening  10/20/2022   Medicare Annual Wellness Visit  03/18/2023   Mammogram  06/17/2023   DTaP/Tdap/Td vaccine (4 - Td or Tdap) 11/28/2030   Flu Shot  Completed   Hepatitis C Screening: USPSTF Recommendation to screen - Ages 18-79 yo.  Completed   Zoster (Shingles) Vaccine  Completed   HPV Vaccine  Aged Out   HIV Screening  Discontinued    Advanced directives: copies in chart   Conditions/risks identified: lose some weight   Next appointment: Follow up in one year for your annual wellness visit.   Preventive Care 40-64 Years, Female Preventive care refers to lifestyle choices and visits with your health care provider that can promote health and wellness. What does preventive care include? A yearly physical exam. This is also called an annual well check. Dental exams once or twice a year. Routine eye exams. Ask your health care provider how often you should have your eyes checked. Personal lifestyle choices, including: Daily care of your teeth and gums. Regular physical activity. Eating a healthy diet. Avoiding tobacco and drug use. Limiting alcohol use. Practicing safe sex. Taking low-dose aspirin daily starting at age 27. Taking vitamin and mineral supplements as recommended by your health care provider. What happens during an annual well check? The services and screenings done by your health care provider during your annual well check will depend on  your age, overall health, lifestyle risk factors, and family history of disease. Counseling  Your health care provider may ask you questions about your: Alcohol use. Tobacco use. Drug use. Emotional well-being. Home and relationship well-being. Sexual activity. Eating habits. Work and work Statistician. Method of birth control. Menstrual cycle. Pregnancy history. Screening  You may have the following tests or measurements: Height, weight, and BMI. Blood pressure. Lipid and cholesterol levels. These may be checked every 5 years, or more frequently if you are over 56 years old. Skin check. Lung cancer screening. You may have this screening every year starting at age 79 if you have a 30-pack-year history of smoking and currently smoke or have quit within the past 15 years. Fecal occult blood test (FOBT) of the stool. You may have this test every year starting at age 64. Flexible sigmoidoscopy or colonoscopy. You may have a sigmoidoscopy every 5 years or a colonoscopy every 10 years starting at age 98. Hepatitis C blood test. Hepatitis B blood test. Sexually transmitted disease (STD) testing. Diabetes screening. This is done by checking your blood sugar (glucose) after you have not eaten for a while (fasting). You may have this done every 1-3 years. Mammogram. This may be done every 1-2 years. Talk to your health care provider about when you should start having regular mammograms. This may depend on whether you have a family history of breast cancer. BRCA-related cancer screening. This may be done if  you have a family history of breast, ovarian, tubal, or peritoneal cancers. Pelvic exam and Pap test. This may be done every 3 years starting at age 14. Starting at age 6, this may be done every 5 years if you have a Pap test in combination with an HPV test. Bone density scan. This is done to screen for osteoporosis. You may have this scan if you are at high risk for osteoporosis. Discuss your  test results, treatment options, and if necessary, the need for more tests with your health care provider. Vaccines  Your health care provider may recommend certain vaccines, such as: Influenza vaccine. This is recommended every year. Tetanus, diphtheria, and acellular pertussis (Tdap, Td) vaccine. You may need a Td booster every 10 years. Zoster vaccine. You may need this after age 61. Pneumococcal 13-valent conjugate (PCV13) vaccine. You may need this if you have certain conditions and were not previously vaccinated. Pneumococcal polysaccharide (PPSV23) vaccine. You may need one or two doses if you smoke cigarettes or if you have certain conditions. Talk to your health care provider about which screenings and vaccines you need and how often you need them. This information is not intended to replace advice given to you by your health care provider. Make sure you discuss any questions you have with your health care provider. Document Released: 04/11/2015 Document Revised: 12/03/2015 Document Reviewed: 01/14/2015 Elsevier Interactive Patient Education  2017 [place] Prevention in the Home Falls can cause injuries. They can happen to people of all ages. There are many things you can do to make your home safe and to help prevent falls. What can I do on the outside of my home? Regularly fix the edges of walkways and driveways and fix any cracks. Remove anything that might make you trip as you walk through a door, such as a raised step or threshold. Trim any bushes or trees on the path to your home. Use bright outdoor lighting. Clear any walking paths of anything that might make someone trip, such as rocks or tools. Regularly check to see if handrails are loose or broken. Make sure that both sides of any steps have handrails. Any raised decks and porches should have guardrails on the edges. Have any leaves, snow, or ice cleared regularly. Use sand or salt on walking paths during  winter. Clean up any spills in your garage right away. This includes oil or grease spills. What can I do in the bathroom? Use night lights. Install grab bars by the toilet and in the tub and shower. Do not use towel bars as grab bars. Use non-skid mats or decals in the tub or shower. If you need to sit down in the shower, use a plastic, non-slip stool. Keep the floor dry. Clean up any water that spills on the floor as soon as it happens. Remove soap buildup in the tub or shower regularly. Attach bath mats securely with double-sided non-slip rug tape. Do not have throw rugs and other things on the floor that can make you trip. What can I do in the bedroom? Use night lights. Make sure that you have a light by your bed that is easy to reach. Do not use any sheets or blankets that are too big for your bed. They should not hang down onto the floor. Have a firm chair that has side arms. You can use this for support while you get dressed. Do not have throw rugs and other things on the floor  that can make you trip. What can I do in the kitchen? Clean up any spills right away. Avoid walking on wet floors. Keep items that you use a lot in easy-to-reach places. If you need to reach something above you, use a strong step stool that has a grab bar. Keep electrical cords out of the way. Do not use floor polish or wax that makes floors slippery. If you must use wax, use non-skid floor wax. Do not have throw rugs and other things on the floor that can make you trip. What can I do with my stairs? Do not leave any items on the stairs. Make sure that there are handrails on both sides of the stairs and use them. Fix handrails that are broken or loose. Make sure that handrails are as long as the stairways. Check any carpeting to make sure that it is firmly attached to the stairs. Fix any carpet that is loose or worn. Avoid having throw rugs at the top or bottom of the stairs. If you do have throw rugs,  attach them to the floor with carpet tape. Make sure that you have a light switch at the top of the stairs and the bottom of the stairs. If you do not have them, ask someone to add them for you. What else can I do to help prevent falls? Wear shoes that: Do not have high heels. Have rubber bottoms. Are comfortable and fit you well. Are closed at the toe. Do not wear sandals. If you use a stepladder: Make sure that it is fully opened. Do not climb a closed stepladder. Make sure that both sides of the stepladder are locked into place. Ask someone to hold it for you, if possible. Clearly mark and make sure that you can see: Any grab bars or handrails. First and last steps. Where the edge of each step is. Use tools that help you move around (mobility aids) if they are needed. These include: Canes. Walkers. Scooters. Crutches. Turn on the lights when you go into a dark area. Replace any light bulbs as soon as they burn out. Set up your furniture so you have a clear path. Avoid moving your furniture around. If any of your floors are uneven, fix them. If there are any pets around you, be aware of where they are. Review your medicines with your doctor. Some medicines can make you feel dizzy. This can increase your chance of falling. Ask your doctor what other things that you can do to help prevent falls. This information is not intended to replace advice given to you by your health care provider. Make sure you discuss any questions you have with your health care provider. Document Released: 01/09/2009 Document Revised: 08/21/2015 Document Reviewed: 04/19/2014 Elsevier Interactive Patient Education  2017 [employer].

## 2022-03-17 NOTE — Progress Notes (Cosign Needed)
I connected with  Sherri Sullivan on 03/17/22 by a audio enabled telemedicine application and verified that I am speaking with the correct person using two identifiers.  Patient Location: Home  Provider Location: Home Office  I discussed the limitations of evaluation and management by telemedicine. The patient expressed understanding and agreed to proceed.   Subjective:   Sherri Sullivan is a 61 y.o. female who presents for an Initial Medicare Annual Wellness Visit.  Review of Systems    Cardiac Risk Factors include: advanced age (>50mn, >>72women);dyslipidemia;hypertension;obesity (BMI >30kg/m2)     Objective:    Today's Vitals   03/17/22 1514  Weight: 223 lb (101.2 kg)   Body mass index is 39.5 kg/m.     03/17/2022    3:21 PM 11/18/2020    2:45 PM 12/02/2015    2:05 PM 09/13/2014    3:39 PM 09/23/2011    7:11 PM 09/23/2011    3:19 PM  Advanced Directives  Does Patient Have a Medical Advance Directive? Yes Yes Yes Yes Patient has advance directive, copy not in chart Patient does not have advance directive  Type of Advance Directive [place] will [place]iry[place]Living will [place]al[place]Living will [place]New Smy[place]na BeachLiving will HPin Oak AcresLiving will   Does patient want to make changes to medical advance directive? No - Patient declined No - Patient declined No - Patient declined     Copy of [place] Chart? Yes - validated most recent copy scanned in chart (See row information) Yes - validated most recent copy scanned in chart (See row information) No - copy requested  Copy requested from other (Comment)   Pre-existing out of facility DNR order (yellow form or pink MOST form)      No    Current Medications (verified) Outpatient Encounter Medications as of 03/17/2022  Medication Sig   ascorbic acid (VITAMIN C) 100 MG tablet    atorvastatin (LIPITOR) 10 MG tablet TAKE ONE  TABLET BY MOUTH ONE TIME DAILY   benzonatate (TESSALON) 100 MG capsule Take 2 capsules (200 mg total) by mouth 2 (two) times daily as needed for cough.   Cholecalciferol (D 1000) 25 MCG (1000 UT) capsule    diclofenac (VOLTAREN) 75 MG EC tablet Take 1 tablet by mouth twice daily   estradiol (ESTRACE) 2 MG tablet Take 1 tablet by mouth once daily   fluticasone (FLONASE) 50 MCG/ACT nasal spray Use 2 spray(s) in each nostril once daily   methocarbamol (ROBAXIN) 500 MG tablet Take 1 tablet (500 mg total) by mouth every 6 (six) hours as needed for muscle spasms.   Multiple Vitamins-Minerals (ZINC PO)    OMEGA-3 KRILL OIL PO Take 353 mg by mouth daily. MegaRed Joint Care   potassium chloride SA (KLOR-CON M20) 20 MEQ tablet Take 1 tablet (20 mEq total) by mouth 2 (two) times daily.   Probiotic Product (PROBIOTIC PO) Take 2 tablets by mouth at bedtime.   triamterene-hydrochlorothiazide (MAXZIDE-25) 37.5-25 MG tablet Take 1 tablet by mouth once daily   Wheat Dextrin (BENEFIBER) POWD Take 2-3 scoop by mouth daily as needed.   albuterol (VENTOLIN HFA) 108 (90 Base) MCG/ACT inhaler Inhale 1-2 puffs into the lungs every 6 (six) hours as needed for wheezing or shortness of breath. (Patient not taking: Reported on 03/17/2022)   montelukast (SINGULAIR) 10 MG tablet Take 1 tablet (10 mg total) by mouth daily as needed. (Patient not taking: Reported on 03/17/2022)  No facility-administered encounter medications on file as of 03/17/2022.    Allergies (verified) Doxycycline   History: Past Medical History:  Diagnosis Date   Anginal pain ([place])    Arthritis    L shoulder- has had injections, degenerative changes in lumbar spine    Back pain    Chicken pox as a child   Constipation    Dizziness    Dysrhythmia 2013   SVT- treated by ablation by Dr. Lovena Le to f/u with as needed basis    Encounter for preventative adult health care exam with abnormal findings 02/09/2015   HTN (hypertension)    Hypokalemia  02/09/2015   Joint pain    Leg edema    Measles as a child   Measles as a child   Obesity    Panic attack    during episode of feeling to crowded    Preventative health care 02/09/2015   Seasonal allergies    SOB (shortness of breath) 09/23/11   "a little bit; at rest; before ablation", SOB again now (08/2014- due to lack  of exercise)    SVT (supraventricular tachycardia)    Uterine fibroid 12/17/2012   Cervical polyp per patient Follows with [facility], Dr Evette Cristal   Past Surgical History:  Procedure Laterality Date   ABDOMINAL HYSTERECTOMY  1990's   CARDIAC ELECTROPHYSIOLOGY STUDY AND ABLATION  09/23/11   MAXIMUM ACCESS (MAS)POSTERIOR LUMBAR INTERBODY FUSION (PLIF) 1 LEVEL N/A 09/24/2014   Procedure: L5-S1 MAS PLIF ;  Surgeon: Erline Levine, MD;  Location: [facility];  Service: Neurosurgery;  Laterality: N/A;  L5-S1 MAS PLIF fusion   SUPRAVENTRICULAR TACHYCARDIA ABLATION N/A 09/23/2011   Procedure: SUPRAVENTRICULAR TACHYCARDIA ABLATION;  Surgeon: Evans Lance, MD;  Location: [facility] CATH LAB;  Service: Cardiovascular;  Laterality: N/A;   TUBAL LIGATION  1980's   Family History  Problem Relation Age of Onset   Diabetes Mother        type 2   Heart disease Mother    Hypertension Mother    Stroke Mother    Obesity Mother    Heart disease Father    Obesity Father    Cancer Sister        lung   Hypertension Daughter    Lupus Daughter    Diabetes Maternal Aunt    Kidney disease Maternal Aunt    Diabetes Maternal Uncle    Stroke Maternal Grandmother    Breast cancer Cousin    Colon cancer Neg Hx    Esophageal cancer Neg Hx    Rectal cancer Neg Hx    Stomach cancer Neg Hx    Social History   Socioeconomic History   Marital status: Married    Spouse name: Sherri Sullivan   Number of children: 2   Years of education: Not on file   Highest education level: Not on file  Occupational History   Occupation: Retired  Tobacco Use   Smoking status: Former    Packs/day:  0.12    Years: 8.00    Total pack years: 0.96    Types: Cigarettes    Quit date: 03/29/1984    Years since quitting: 37.9   Smokeless tobacco: Never  Vaping Use   Vaping Use: Never used  Substance and Sexual Activity   Alcohol use: Yes    Alcohol/week: 14.0 standard drinks of alcohol    Types: 14 Glasses of wine per week    Comment: Drinks 2-3 glasses wine on the weekends   Drug use:  No   Sexual activity: Not Currently  Other Topics Concern   Not on file  Social History Narrative   No major restrictions   Lives with husband, daughter, grand daughter in a 2 story home.    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   Social Determinants of Health   Financial Resource Strain: Low Risk  (03/17/2022)   Overall Financial Resource Strain (CARDIA)    Difficulty of Paying Living Expenses: Not hard at all  Food Insecurity: No Food Insecurity (03/17/2022)   Hunger Vital Sign    Worried About Running Out of Food in the Last Year: Never true    Ran Out of Food in the Last Year: Never true  Transportation Needs: No Transportation Needs (03/17/2022)   PRAPARE - Hydrologist (Medical): No    Lack of Transportation (Non-Medical): No  Physical Activity: Inactive (03/17/2022)   Exercise Vital Sign    Days of Exercise per Week: 0 days    Minutes of Exercise per Session: 0 min  Stress: No Stress Concern Present (03/17/2022)   [place]    Feeling of Stress : Not at all  Social Connections: Moderately Isolated (03/17/2022)   Social Connection and Isolation Panel [NHANES]    Frequency of Communication with Friends and Family: More than three times a week    Frequency of Social Gatherings with Friends and Family: Twice a week    Attends Religious Services: Never    Marine scientist or Organizations: No    Attends Music therapist: Never    Marital  Status: Married    Tobacco Counseling Counseling given: Not Answered   Clinical Intake:  Pre-visit preparation completed: Yes  Pain : No/denies pain     BMI - recorded: 39.5 Nutritional Status: BMI > 30  Obese Nutritional Risks: None Diabetes: No  How often do you need to have someone help you when you read instructions, pamphlets, or other written materials from your doctor or pharmacy?: 1 - Never  Diabetic?no  Interpreter Needed?: No  Information entered by :: Charlott Rakes, LPN   Activities of Daily Living    03/17/2022    3:22 PM 03/16/2022   11:36 AM  In your present state of health, do you have any difficulty performing the following activities:  Hearing? 0 0  Vision? 0 0  Difficulty concentrating or making decisions? 0 0  Walking or climbing stairs? 0 1  Dressing or bathing? 0 0  Doing errands, shopping? 0 1  Preparing Food and eating ? N N  Using the Toilet? N N  In the past six months, have you accidently leaked urine? Y Y  Comment at times   Do you have problems with loss of bowel control? N N  Managing your Medications? N N  Managing your Finances? N N  Housekeeping or managing your Housekeeping? Aggie Moats    Patient Care Team: Mosie Lukes, MD as PCP - General (Family Medicine) Dene Gentry, MD as Consulting Physician (Sports Medicine) Erline Levine, MD as Consulting Physician (Neurosurgery) Ramonita Lab, Jovita Kussmaul, MD as Referring Physician (Ophthalmology) Quin Hoop, MD as Referring Physician (Obstetrics and Gynecology)  Indicate any recent Medical Services you may have received from other than [facility] providers in the past year (date may be approximate).     Assessment:   This is a routine  wellness examination for [place].  Hearing/Vision screen Hearing Screening - Comments:: Pt denies any hearing issues  Vision Screening - Comments:: Pt follows up with Dr Monica Martinez at [facility] for annul eye exams   Dietary issues and exercise  activities discussed: Current Exercise Habits: The patient does not participate in regular exercise at present   Goals Addressed             This Visit's Progress    Patient Stated       Lose some weight        Depression Screen    03/17/2022    3:20 PM 12/03/2021    1:16 PM 11/27/2020    2:08 PM 11/16/2019   12:01 PM 01/18/2017    8:54 AM  PHQ 2/9 Scores  PHQ - 2 Score 0 0 0 0 1  PHQ- 9 Score  0 0  6    Fall Risk    03/17/2022    3:22 PM 03/16/2022   11:36 AM 12/03/2021    1:16 PM  [facility] in the past year? 0 0   Number falls in past yr: 0    Injury with Fall? 0  0  Risk for fall due to : Impaired vision    Follow up Falls prevention discussed  Falls evaluation completed    FALL RISK PREVENTION PERTAINING TO THE HOME:  Any stairs in or around the home? Yes  If so, are there any without handrails? No  Home free of loose throw rugs in walkways, pet beds, electrical cords, etc? Yes  Adequate lighting in your home to reduce risk of falls? Yes   ASSISTIVE DEVICES UTILIZED TO PREVENT FALLS:  Life alert? No  Use of a cane, walker or w/c? No  Grab bars in the bathroom? Yes  Shower chair or bench in shower? Yes  Elevated toilet seat or a handicapped toilet? Yes   TIMED UP AND GO:  Was the test performed? No .   Cognitive Function:        03/17/2022    3:23 PM  6CIT Screen  What Year? 0 points  What month? 0 points  What time? 0 points  Count back from 20 0 points  Months in reverse 0 points  Repeat phrase 0 points  Total Score 0 points    Immunizations Immunization History  Administered Date(s) Administered   Influenza,inj,Quad PF,6+ Mos 02/28/2017, 01/09/2018, 12/07/2018, 02/26/2020, 11/27/2020, 12/03/2021   Influenza-Unspecified 12/27/2013, 12/25/2014, 12/22/2015, 11/27/2020   Moderna Covid-19 Vaccine Bivalent Booster 42yr & up 04/15/2021   Moderna SARS-COV2 Booster Vaccination 03/03/2020   Moderna Sars-Covid-2 Vaccination 06/08/2019,  07/09/2019   Pneumococcal Conjugate-13 04/12/2013   Td 01/09/2018   Tdap 03/30/2007, 11/27/2020   Unspecified SARS-COV-2 Vaccination 03/03/2020   Zoster Recombinat (Shingrix) 08/09/2016, 12/16/2016    TDAP status: Up to date  Flu Vaccine status: Up to date  Pneumococcal vaccine status: Up to date  Covid-19 vaccine status: Completed vaccines  Qualifies for Shingles Vaccine? Yes   Zostavax completed Yes   Shingrix Completed?: Yes  Screening Tests Health Maintenance  Topic Date Due   PAP SMEAR-Modifier  05/30/2021   COVID-19 Vaccine (6 - 2023-24 season) 11/27/2021   COLONOSCOPY (Pts 45-466yrInsurance coverage will need to be confirmed)  10/20/2022   Medicare Annual Wellness (AWV)  03/18/2023   MAMMOGRAM  06/17/2023   DTaP/Tdap/Td (4 - Td or Tdap) 11/28/2030   INFLUENZA VACCINE  Completed   Hepatitis C Screening  Completed   Zoster Vaccines-  Shingrix  Completed   HPV VACCINES  Aged Out   HIV Screening  Discontinued    Health Maintenance  Health Maintenance Due  Topic Date Due   PAP SMEAR-Modifier  05/30/2021   COVID-19 Vaccine (6 - 2023-24 season) 11/27/2021    Colorectal cancer screening: Type of screening: Colonoscopy. Completed 10/19/17. Repeat every 5 years  Mammogram status: Completed 06/16/21. Repeat every year   Additional Screening:  Hepatitis C Screening: Completed 01/09/18  Vision Screening: Recommended annual ophthalmology exams for early detection of glaucoma and other disorders of the eye. Is the patient up to date with their annual eye exam?  Yes  Who is the provider or what is the name of the office in which the patient attends annual eye exams? [facility]  If pt is not established with a provider, would they like to be referred to a provider to establish care? No .   Dental Screening: Recommended annual dental exams for proper oral hygiene  Community Resource Referral / Chronic Care Management: CRR required this visit?  No   CCM required this  visit?  No      Plan:     I have personally reviewed and noted the following in the patient's chart:   Medical and social history Use of alcohol, tobacco or illicit drugs  Current medications and supplements including opioid prescriptions. Patient is not currently taking opioid prescriptions. Functional ability and status Nutritional status Physical activity Advanced directives List of other physicians Hospitalizations, surgeries, and ER visits in previous 12 months Vitals Screenings to include cognitive, depression, and falls Referrals and appointments  In addition, I have reviewed and discussed with patient certain preventive protocols, quality metrics, and best practice recommendations. A written personalized care plan for preventive services as well as general preventive health recommendations were provided to patient.     Willette Brace, LPN   12/45/8099   Nurse Notes: none   I have reviewed and agree with [facility] documentation.  Kathlene November, MD

## 2022-04-14 DIAGNOSIS — G5601 Carpal tunnel syndrome, right upper limb: Secondary | ICD-10-CM | POA: Diagnosis not present

## 2022-04-14 DIAGNOSIS — Z6841 Body Mass Index (BMI) 40.0 and over, adult: Secondary | ICD-10-CM | POA: Diagnosis not present

## 2022-04-29 ENCOUNTER — Other Ambulatory Visit: Payer: Self-pay | Admitting: Family Medicine

## 2022-05-04 ENCOUNTER — Encounter: Payer: PPO | Admitting: Family Medicine

## 2022-05-04 DIAGNOSIS — G5601 Carpal tunnel syndrome, right upper limb: Secondary | ICD-10-CM | POA: Diagnosis not present

## 2022-05-31 ENCOUNTER — Other Ambulatory Visit: Payer: Self-pay | Admitting: Family Medicine

## 2022-06-11 ENCOUNTER — Other Ambulatory Visit (HOSPITAL_BASED_OUTPATIENT_CLINIC_OR_DEPARTMENT_OTHER): Payer: Self-pay | Admitting: Family Medicine

## 2022-06-11 DIAGNOSIS — Z1231 Encounter for screening mammogram for malignant neoplasm of breast: Secondary | ICD-10-CM

## 2022-06-21 ENCOUNTER — Encounter (HOSPITAL_BASED_OUTPATIENT_CLINIC_OR_DEPARTMENT_OTHER): Payer: Self-pay

## 2022-06-21 ENCOUNTER — Ambulatory Visit (HOSPITAL_BASED_OUTPATIENT_CLINIC_OR_DEPARTMENT_OTHER)
Admission: RE | Admit: 2022-06-21 | Discharge: 2022-06-21 | Disposition: A | Discharge status: Home / Self Care | Payer: PPO | Source: Ambulatory Visit | Attending: Family Medicine | Admitting: Family Medicine

## 2022-06-21 DIAGNOSIS — Z1231 Encounter for screening mammogram for malignant neoplasm of breast: Secondary | ICD-10-CM | POA: Insufficient documentation

## 2022-06-23 ENCOUNTER — Other Ambulatory Visit: Payer: Self-pay | Admitting: Family Medicine

## 2022-06-23 DIAGNOSIS — R928 Other abnormal and inconclusive findings on diagnostic imaging of breast: Secondary | ICD-10-CM

## 2022-06-25 ENCOUNTER — Other Ambulatory Visit: Payer: Self-pay | Admitting: Family Medicine

## 2022-07-05 ENCOUNTER — Ambulatory Visit
Admission: RE | Admit: 2022-07-05 | Discharge: 2022-07-05 | Disposition: A | Discharge status: Home / Self Care | Payer: PPO | Source: Ambulatory Visit | Attending: Family Medicine | Admitting: Family Medicine

## 2022-07-05 DIAGNOSIS — R928 Other abnormal and inconclusive findings on diagnostic imaging of breast: Secondary | ICD-10-CM

## 2022-07-05 DIAGNOSIS — N6001 Solitary cyst of right breast: Secondary | ICD-10-CM | POA: Diagnosis not present

## 2022-07-13 ENCOUNTER — Encounter: Payer: Self-pay | Admitting: Family Medicine

## 2022-08-15 NOTE — Assessment & Plan Note (Signed)
Well controlled, no changes to meds. Encouraged heart healthy diet such as the DASH diet and exercise as tolerated.  °

## 2022-08-15 NOTE — Assessment & Plan Note (Signed)
Encourage heart healthy diet such as MIND or DASH diet, increase exercise, avoid trans fats, simple carbohydrates and processed foods, consider a krill or fish or flaxseed oil cap daily. Tolerating Atorvastatin 

## 2022-08-15 NOTE — Assessment & Plan Note (Signed)
Hydrate and monitor 

## 2022-08-15 NOTE — Assessment & Plan Note (Signed)
Supplement and monitor 

## 2022-08-15 NOTE — Assessment & Plan Note (Signed)
hgba1c acceptable, minimize simple carbs. Increase exercise as tolerated.  

## 2022-08-16 ENCOUNTER — Ambulatory Visit (INDEPENDENT_AMBULATORY_CARE_PROVIDER_SITE_OTHER): Payer: PPO | Admitting: Family Medicine

## 2022-08-16 ENCOUNTER — Ambulatory Visit (HOSPITAL_BASED_OUTPATIENT_CLINIC_OR_DEPARTMENT_OTHER)
Admission: RE | Admit: 2022-08-16 | Discharge: 2022-08-16 | Disposition: A | Discharge status: Home / Self Care | Payer: PPO | Source: Ambulatory Visit | Attending: Family Medicine | Admitting: Family Medicine

## 2022-08-16 VITALS — BP 130/72 | HR 76 | Temp 98.0°F | Resp 16 | Ht 63.0 in | Wt 231.6 lb

## 2022-08-16 DIAGNOSIS — E559 Vitamin D deficiency, unspecified: Secondary | ICD-10-CM | POA: Diagnosis not present

## 2022-08-16 DIAGNOSIS — Z0000 Encounter for general adult medical examination without abnormal findings: Secondary | ICD-10-CM

## 2022-08-16 DIAGNOSIS — I10 Essential (primary) hypertension: Secondary | ICD-10-CM | POA: Diagnosis not present

## 2022-08-16 DIAGNOSIS — R0600 Dyspnea, unspecified: Secondary | ICD-10-CM | POA: Diagnosis not present

## 2022-08-16 DIAGNOSIS — R350 Frequency of micturition: Secondary | ICD-10-CM | POA: Diagnosis not present

## 2022-08-16 DIAGNOSIS — E785 Hyperlipidemia, unspecified: Secondary | ICD-10-CM | POA: Diagnosis not present

## 2022-08-16 DIAGNOSIS — R739 Hyperglycemia, unspecified: Secondary | ICD-10-CM | POA: Diagnosis not present

## 2022-08-16 DIAGNOSIS — R0789 Other chest pain: Secondary | ICD-10-CM | POA: Diagnosis not present

## 2022-08-16 DIAGNOSIS — Z0189 Encounter for other specified special examinations: Secondary | ICD-10-CM

## 2022-08-16 DIAGNOSIS — Z8616 Personal history of COVID-19: Secondary | ICD-10-CM

## 2022-08-16 DIAGNOSIS — R252 Cramp and spasm: Secondary | ICD-10-CM | POA: Diagnosis not present

## 2022-08-16 DIAGNOSIS — Z1211 Encounter for screening for malignant neoplasm of colon: Secondary | ICD-10-CM

## 2022-08-16 DIAGNOSIS — Z9889 Other specified postprocedural states: Secondary | ICD-10-CM | POA: Diagnosis not present

## 2022-08-16 DIAGNOSIS — R0602 Shortness of breath: Secondary | ICD-10-CM | POA: Diagnosis not present

## 2022-08-16 NOTE — Assessment & Plan Note (Addendum)
Patient denies any difficulties at home. No trouble with ADLs, depression or falls. See EMR for functional status screen and depression screen. No recent changes to vision or hearing. Is UTD with immunizations. Is UTD with screening. Discussed Advanced Directives. Encouraged heart healthy diet, exercise as tolerated and adequate sleep. Passed vision and hearing screens See patient's problem list for health risk factors to monitor. See AVS for preventative healthcare recommendation schedule. Patient encouraged to maintain heart healthy diet, regular exercise, adequate sleep. Consider daily probiotics. Take medications as prescribed. Labs reviewed. Colonoscopy in 2019 repeat this year. MM 06/2022 repeat next year.

## 2022-08-16 NOTE — Assessment & Plan Note (Signed)
She notes she has not been quite right since she had covid in early 2024. She is struggling with increased dyspnea and fatigue, she is noting waking up with chest pressure also. EKG is normal today but will proceed with echo and referral to cardiology given her many risk factors. If pain returns and does not remit she will seek care

## 2022-08-16 NOTE — Assessment & Plan Note (Signed)
Repeat colonoscopy this year

## 2022-08-16 NOTE — Patient Instructions (Addendum)
Hydrate 60-80 ounces a day 4000, 8000 steps DASH or MIND or PURE diet.     Preventive Care 6-62 Years Old, Female Preventive care refers to lifestyle choices and visits with your health care provider that can promote health and wellness. Preventive care visits are also called wellness exams. What can I expect for my preventive care visit? Counseling Your health care provider may ask you questions about your: Medical history, including: Past medical problems. Family medical history. Pregnancy history. Current health, including: Menstrual cycle. Method of birth control. Emotional well-being. Home life and relationship well-being. Sexual activity and sexual health. Lifestyle, including: Alcohol, nicotine or tobacco, and drug use. Access to firearms. Diet, exercise, and sleep habits. Work and work Astronomer. Sunscreen use. Safety issues such as seatbelt and bike helmet use. Physical exam Your health care provider will check your: Height and weight. These may be used to calculate your BMI (body mass index). BMI is a measurement that tells if you are at a healthy weight. Waist circumference. This measures the distance around your waistline. This measurement also tells if you are at a healthy weight and may help predict your risk of certain diseases, such as type 2 diabetes and high blood pressure. Heart rate and blood pressure. Body temperature. Skin for abnormal spots. What immunizations do I need?  Vaccines are usually given at various ages, according to a schedule. Your health care provider will recommend vaccines for you based on your age, medical history, and lifestyle or other factors, such as travel or where you work. What tests do I need? Screening Your health care provider may recommend screening tests for certain conditions. This may include: Lipid and cholesterol levels. Diabetes screening. This is done by checking your blood sugar (glucose) after you have not eaten  for a while (fasting). Pelvic exam and Pap test. Hepatitis B test. Hepatitis C test. HIV (human immunodeficiency virus) test. STI (sexually transmitted infection) testing, if you are at risk. Lung cancer screening. Colorectal cancer screening. Mammogram. Talk with your health care provider about when you should start having regular mammograms. This may depend on whether you have a family history of breast cancer. BRCA-related cancer screening. This may be done if you have a family history of breast, ovarian, tubal, or peritoneal cancers. Bone density scan. This is done to screen for osteoporosis. Talk with your health care provider about your test results, treatment options, and if necessary, the need for more tests. Follow these instructions at home: Eating and drinking  Eat a diet that includes fresh fruits and vegetables, whole grains, lean protein, and low-fat dairy products. Take vitamin and mineral supplements as recommended by your health care provider. Do not drink alcohol if: Your health care provider tells you not to drink. You are pregnant, may be pregnant, or are planning to become pregnant. If you drink alcohol: Limit how much you have to 0-1 drink a day. Know how much alcohol is in your drink. In the [place] one drink equals one 12 oz bottle of beer (355 mL), one 5 oz glass of wine (148 mL), or one 1 oz glass of hard liquor (44 mL). Lifestyle Brush your teeth every morning and night with fluoride toothpaste. Floss one time each day. Exercise for at least 30 minutes 5 or more days each week. Do not use any products that contain nicotine or tobacco. These products include cigarettes, chewing tobacco, and vaping devices, such as e-cigarettes. If you need help quitting, ask your health care provider. Do not use  drugs. If you are sexually active, practice safe sex. Use a condom or other form of protection to prevent STIs. If you do not wish to become pregnant, use a form of birth  control. If you plan to become pregnant, see your health care provider for a prepregnancy visit. Take aspirin only as told by your health care provider. Make sure that you understand how much to take and what form to take. Work with your health care provider to find out whether it is safe and beneficial for you to take aspirin daily. Find healthy ways to manage stress, such as: Meditation, yoga, or listening to music. Journaling. Talking to a trusted person. Spending time with friends and family. Minimize exposure to UV radiation to reduce your risk of skin cancer. Safety Always wear your seat belt while driving or riding in a vehicle. Do not drive: If you have been drinking alcohol. Do not ride with someone who has been drinking. When you are tired or distracted. While texting. If you have been using any mind-altering substances or drugs. Wear a helmet and other protective equipment during sports activities. If you have firearms in your house, make sure you follow all gun safety procedures. Seek help if you have been physically or sexually abused. What's next? Visit your health care provider once a year for an annual wellness visit. Ask your health care provider how often you should have your eyes and teeth checked. Stay up to date on all vaccines. This information is not intended to replace advice given to you by your health care provider. Make sure you discuss any questions you have with your health care provider. Document Revised: 09/10/2020 Document Reviewed: 09/10/2020 Elsevier Patient Education  2023 [employer].

## 2022-08-16 NOTE — Assessment & Plan Note (Addendum)
Worsening order echo and referred to pulmonology and cardiology, cxr ordered

## 2022-08-16 NOTE — Assessment & Plan Note (Signed)
Check ua and cx

## 2022-08-16 NOTE — Progress Notes (Signed)
Subjective:   By signing my name below, I, Doylene Bode, attest that this documentation has been prepared under the direction and in the presence of Danise Edge, MD 08/16/22   Patient ID: Sherri Sullivan, female    DOB: 1960-06-27, 62 y.o.   MRN: [id number]  Chief Complaint  Patient presents with   Annual Exam    Annual Exam     HPI Patient is in today for a comprehensive physical exam and follow up on chronic medical concerns  Dyspnea, Restless Sleep, Witnessed Sleep Apnea Reports she was having sleep apnea during her recent carpal tunnel release. Does not wake up with headaches. Reports shortness of breath during day and night hours. Had a dyspneic episode during night of 08/13/22.  Chest Pain She has had occasional chest tightness since she had Covid-19 in late 01/2022. Has had a couple of chest pain episodes during weekend of 08/13/22 where she had a heavy feeling in her chest. Not currently experiencing chest pain. Denies palpitations.  Urinary Frequency She has been experiencing daytime urinary frequency recently. Denies other urinary symptoms. History of chronic back pain- difficult to distinguish from flank pain.  Hypertension Treated with triamterene-hydrochlorothiazide 37.5-25 mg daily. Blood pressure normal today at 130/72.  Hyperlipidemia  Treated with atorvastatin 10 mg daily.  Muscle Spasms Treated with methocarbamol 500 mg every 6 hours as needed.  Hypokalemia Treated with Klor-Con 20 meq twice daily.  Denies new cough, new or concerning skin problems. Reports her healthcare power of attorney is in place. Has not been exercising, drinking enough water. Reports mood stable. Labs are pending.  Normal Pap 05/31/18. 07/05/22 mammogram showed benign cyst in right breast. Recommended repeat in 2025. Colonoscopy 09/2017 showing polyps.  Vaccine counseling: UTD on flu, Covid-19, tetanus, shingles vaccines.  Past Medical History:  Diagnosis Date   Anginal  pain (HCC)    Arthritis    L shoulder- has had injections, degenerative changes in lumbar spine    Back pain    Chicken pox as a child   Constipation    Dizziness    Dysrhythmia 2013   SVT- treated by ablation by Dr. Ladona Ridgel to f/u with as needed basis    Encounter for preventative adult health care exam with abnormal findings 02/09/2015   HTN (hypertension)    Hypokalemia 02/09/2015   Joint pain    Leg edema    Measles as a child   Measles as a child   Obesity    Panic attack    during episode of feeling to crowded    Preventative health care 02/09/2015   Seasonal allergies    SOB (shortness of breath) 09/23/11   "a little bit; at rest; before ablation", SOB again now (08/2014- due to lack  of exercise)    SVT (supraventricular tachycardia)    Uterine fibroid 12/17/2012   Cervical polyp per patient Follows with [facility], Dr Varney Baas    Past Surgical History:  Procedure Laterality Date   ABDOMINAL HYSTERECTOMY  1990's   CARDIAC ELECTROPHYSIOLOGY STUDY AND ABLATION  09/23/11   MAXIMUM ACCESS (MAS)POSTERIOR LUMBAR INTERBODY FUSION (PLIF) 1 LEVEL N/A 09/24/2014   Procedure: L5-S1 MAS PLIF ;  Surgeon: Maeola Harman, MD;  Location: [facility];  Service: Neurosurgery;  Laterality: N/A;  L5-S1 MAS PLIF fusion   SUPRAVENTRICULAR TACHYCARDIA ABLATION N/A 09/23/2011   Procedure: SUPRAVENTRICULAR TACHYCARDIA ABLATION;  Surgeon: Marinus Maw, MD;  Location: [facility] CATH LAB;  Service: Cardiovascular;  Laterality: N/A;   TUBAL  LIGATION  1980's    Family History  Problem Relation Age of Onset   Diabetes Mother        type 2   Heart disease Mother    Hypertension Mother    Stroke Mother    Obesity Mother    Heart disease Father    Obesity Father    Cancer Sister        lung   Hypertension Daughter    Lupus Daughter    Diabetes Maternal Aunt    Kidney disease Maternal Aunt    Diabetes Maternal Uncle    Stroke Maternal Grandmother    Breast cancer Cousin    Colon  cancer Neg Hx    Esophageal cancer Neg Hx    Rectal cancer Neg Hx    Stomach cancer Neg Hx     Social History   Socioeconomic History   Marital status: Married    Spouse name: Maisie Fus   Number of children: 2   Years of education: Not on file   Highest education level: Not on file  Occupational History   Occupation: Retired  Tobacco Use   Smoking status: Former    Packs/day: 0.12    Years: 8.00    Additional pack years: 0.00    Total pack years: 0.96    Types: Cigarettes    Quit date: 03/29/1984    Years since quitting: 38.4   Smokeless tobacco: Never  Vaping Use   Vaping Use: Never used  Substance and Sexual Activity   Alcohol use: Yes    Alcohol/week: 14.0 standard drinks of alcohol    Types: 14 Glasses of wine per week    Comment: Drinks 2-3 glasses wine on the weekends   Drug use: No   Sexual activity: Not Currently  Other Topics Concern   Not on file  Social History Narrative   No major restrictions   Lives with husband, daughter, grand daughter in a 2 story home.    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   Social Determinants of Health   Financial Resource Strain: Low Risk  (03/17/2022)   Overall Financial Resource Strain (CARDIA)    Difficulty of Paying Living Expenses: Not hard at all  Food Insecurity: No Food Insecurity (03/17/2022)   Hunger Vital Sign    Worried About Running Out of Food in the Last Year: Never true    Ran Out of Food in the Last Year: Never true  Transportation Needs: No Transportation Needs (03/17/2022)   PRAPARE - Administrator, Civil Service (Medical): No    Lack of Transportation (Non-Medical): No  Physical Activity: Inactive (03/17/2022)   Exercise Vital Sign    Days of Exercise per Week: 0 days    Minutes of Exercise per Session: 0 min  Stress: No Stress Concern Present (03/17/2022)   [employer] of Occupational Health - Occupational Stress Questionnaire    Feeling of  Stress : Not at all  Social Connections: Moderately Isolated (03/17/2022)   Social Connection and Isolation Panel [NHANES]    Frequency of Communication with Friends and Family: More than three times a week    Frequency of Social Gatherings with Friends and Family: Twice a week    Attends Religious Services: Never    Database administrator or Organizations: No    Attends Banker Meetings: Never    Marital Status: Married  Catering manager Violence: Not At Risk (03/17/2022)  Humiliation, Afraid, Rape, and Kick questionnaire    Fear of Current or Ex-Partner: No    Emotionally Abused: No    Physically Abused: No    Sexually Abused: No    Outpatient Medications Prior to Visit  Medication Sig Dispense Refill   ascorbic acid (VITAMIN C) 100 MG tablet      atorvastatin (LIPITOR) 10 MG tablet TAKE ONE TABLET BY MOUTH ONE TIME DAILY 90 tablet 1   Cholecalciferol (D 1000) 25 MCG (1000 UT) capsule      diclofenac (VOLTAREN) 75 MG EC tablet Take 1 tablet by mouth twice daily 180 tablet 0   estradiol (ESTRACE) 2 MG tablet Take 1 tablet by mouth once daily 90 tablet 0   fluticasone (FLONASE) 50 MCG/ACT nasal spray Use 2 spray(s) in each nostril once daily 32 g 0   KLOR-CON M20 20 MEQ tablet Take 1 tablet by mouth twice daily 180 tablet 0   methocarbamol (ROBAXIN) 500 MG tablet Take 1 tablet (500 mg total) by mouth every 6 (six) hours as needed for muscle spasms. 90 tablet 0   Multiple Vitamins-Minerals (ZINC PO)      OMEGA-3 KRILL OIL PO Take 353 mg by mouth daily. MegaRed Joint Care     Probiotic Product (PROBIOTIC PO) Take 2 tablets by mouth at bedtime.     triamterene-hydrochlorothiazide (MAXZIDE-25) 37.5-25 MG tablet Take 1 tablet by mouth once daily 90 tablet 1   Wheat Dextrin (BENEFIBER) POWD Take 2-3 scoop by mouth daily as needed.     albuterol (VENTOLIN HFA) 108 (90 Base) MCG/ACT inhaler Inhale 1-2 puffs into the lungs every 6 (six) hours as needed for wheezing or shortness  of breath. 8 g 0   benzonatate (TESSALON) 100 MG capsule Take 2 capsules (200 mg total) by mouth 2 (two) times daily as needed for cough. 20 capsule 0   montelukast (SINGULAIR) 10 MG tablet Take 1 tablet (10 mg total) by mouth daily as needed. (Patient not taking: Reported on 03/17/2022) 90 tablet 3   No facility-administered medications prior to visit.    Allergies  Allergen Reactions   Doxycycline Diarrhea and Nausea And Vomiting    Review of Systems  Constitutional:  Negative for chills, fever, malaise/fatigue and weight loss.  HENT:  Negative for congestion, hearing loss, sinus pain and sore throat.   Eyes:  Negative for blurred vision.  Respiratory:  Positive for shortness of breath. Negative for cough and hemoptysis.   Cardiovascular:  Positive for chest pain (Tightness). Negative for palpitations, leg swelling and PND.  Gastrointestinal:  Negative for abdominal pain, constipation, diarrhea, heartburn, nausea and vomiting.  Genitourinary:  Negative for dysuria, flank pain, frequency, hematuria and urgency.  Musculoskeletal:  Negative for back pain, myalgias and neck pain.  Skin:  Negative for itching and rash.       (-) New or concerning growths  Neurological:  Negative for dizziness, tingling, seizures, loss of consciousness and headaches.  Endo/Heme/Allergies:  Negative for polydipsia.  Psychiatric/Behavioral:  Negative for depression. The patient is not nervous/anxious.        Objective:    Physical Exam Constitutional:      General: She is not in acute distress.    Appearance: Normal appearance. She is not ill-appearing.  HENT:     Head: Normocephalic and atraumatic.     Right Ear: Tympanic membrane, ear canal and external ear normal.     Left Ear: Tympanic membrane, ear canal and external ear normal.  Eyes:  Extraocular Movements: Extraocular movements intact.     Pupils: Pupils are equal, round, and reactive to light.  Cardiovascular:     Rate and Rhythm:  Normal rate and regular rhythm.     Heart sounds: Normal heart sounds. No murmur heard.    No gallop.  Pulmonary:     Effort: Pulmonary effort is normal. No respiratory distress.     Breath sounds: Normal breath sounds. No wheezing or rales.  Abdominal:     General: Bowel sounds are normal. There is no distension.     Palpations: Abdomen is soft.     Tenderness: There is no abdominal tenderness. There is no right CVA tenderness or guarding.  Skin:    General: Skin is warm and dry.  Neurological:     General: No focal deficit present.     Mental Status: She is alert and oriented to person, place, and time.     Cranial Nerves: Cranial nerves 2-12 are intact.     Sensory: Sensation is intact.     Motor: Motor function is intact.     Deep Tendon Reflexes: Reflexes are normal and symmetric.  Psychiatric:        Judgment: Judgment normal.     BP 130/72 (BP Location: Right Arm, Patient Position: Sitting, Cuff Size: Normal)   Pulse 76   Temp 98 F (36.7 C) (Oral)   Resp 16   Ht 5\' 3"  (1.6 m)   Wt 231 lb 9.6 oz (105.1 kg)   SpO2 95%   BMI 41.03 kg/m  Wt Readings from Last 3 Encounters:  08/16/22 231 lb 9.6 oz (105.1 kg)  03/17/22 223 lb (101.2 kg)  12/03/21 223 lb 12.8 oz (101.5 kg)       Assessment & Plan:  H/O colonoscopy with polypectomy Assessment & Plan: Repeat colonoscopy this year  Orders: -     Ambulatory referral to Gastroenterology  Essential hypertension, benign Assessment & Plan: Well controlled, no changes to meds. Encouraged heart healthy diet such as the DASH diet and exercise as tolerated.   Orders: -     CBC with Differential/Platelet -     Comprehensive metabolic panel -     TSH  Hyperglycemia Assessment & Plan: hgba1c acceptable, minimize simple carbs. Increase exercise as tolerated.   Orders: -     Hemoglobin A1c -     VITAMIN D 25 Hydroxy (Vit-D Deficiency, Fractures)  Hyperlipidemia, unspecified hyperlipidemia type Assessment &  Plan: Encourage heart healthy diet such as MIND or DASH diet, increase exercise, avoid trans fats, simple carbohydrates and processed foods, consider a krill or fish or flaxseed oil cap daily. Tolerating Atorvastatin  Orders: -     Lipid panel  Muscle cramps Assessment & Plan: Hydrate and monitor   Orders: -     Lipid panel  Vitamin D deficiency Assessment & Plan: Supplement and monitor  Orders: -     VITAMIN D 25 Hydroxy (Vit-D Deficiency, Fractures)  Needs sleep apnea assessment Assessment & Plan: Had a witnessed apnea episode when she was under sedation for her CTS surgery, she acknowledges heavy snoring and excessive fatigue. Is referred to pulmonology for evaluation of sleep apnea.   Orders: -     Ambulatory referral to Pulmonology  Dyspnea, unspecified type Assessment & Plan: Worsening order echo and referred to pulmonology and cardiology, cxr ordered  Orders: -     ECHOCARDIOGRAM COMPLETE; Future -     Ambulatory referral to Pulmonology -     Ambulatory referral  to Cardiology -     DG Chest 2 View; Future -     EKG 12-Lead  Atypical chest pain -     ECHOCARDIOGRAM COMPLETE; Future -     Ambulatory referral to Cardiology -     DG Chest 2 View; Future -     EKG 12-Lead  Urinary frequency Assessment & Plan: Check ua and cx  Orders: -     Urinalysis, Routine w reflex microscopic -     Urine Culture  History of COVID-19 Assessment & Plan: She notes she has not been quite right since she had covid in early 2024. She is struggling with increased dyspnea and fatigue, she is noting waking up with chest pressure also. EKG is normal today but will proceed with echo and referral to cardiology given her many risk factors. If pain returns and does not remit she will seek care    Preventative health care Assessment & Plan: Patient denies any difficulties at home. No trouble with ADLs, depression or falls. See EMR for functional status screen and depression screen.  No recent changes to vision or hearing. Is UTD with immunizations. Is UTD with screening. Discussed Advanced Directives. Encouraged heart healthy diet, exercise as tolerated and adequate sleep. Passed vision and hearing screens See patient's problem list for health risk factors to monitor. See AVS for preventative healthcare recommendation schedule. Patient encouraged to maintain heart healthy diet, regular exercise, adequate sleep. Consider daily probiotics. Take medications as prescribed. Labs reviewed. Colonoscopy in 2019 repeat this year. MM 06/2022 repeat next year.    Colon cancer screening -     Ambulatory referral to Gastroenterology     I,Alexander Ruley,acting as a scribe for Danise Edge, MD.,have documented all relevant documentation on the behalf of Danise Edge, MD,as directed by  Danise Edge, MD while in the presence of Danise Edge, MD.

## 2022-08-16 NOTE — Assessment & Plan Note (Signed)
Had a witnessed apnea episode when she was under sedation for her CTS surgery, she acknowledges heavy snoring and excessive fatigue. Is referred to pulmonology for evaluation of sleep apnea.

## 2022-08-17 ENCOUNTER — Telehealth: Payer: Self-pay

## 2022-08-17 LAB — CBC WITH DIFFERENTIAL/PLATELET
Basophils Absolute: 0.1 10*3/uL (ref 0.0–0.1)
Basophils Relative: 0.9 % (ref 0.0–3.0)
Eosinophils Absolute: 0.2 10*3/uL (ref 0.0–0.7)
Eosinophils Relative: 3.3 % (ref 0.0–5.0)
HCT: 41.7 % (ref 36.0–46.0)
Hemoglobin: 13.6 g/dL (ref 12.0–15.0)
Lymphocytes Relative: 43.1 % (ref 12.0–46.0)
Lymphs Abs: 2.9 10*3/uL (ref 0.7–4.0)
MCHC: 32.7 g/dL (ref 30.0–36.0)
MCV: 90.8 fl (ref 78.0–100.0)
Monocytes Absolute: 0.7 10*3/uL (ref 0.1–1.0)
Monocytes Relative: 10.2 % (ref 3.0–12.0)
Neutro Abs: 2.9 10*3/uL (ref 1.4–7.7)
Neutrophils Relative %: 42.5 % — ABNORMAL LOW (ref 43.0–77.0)
Platelets: 243 10*3/uL (ref 150.0–400.0)
RBC: 4.6 Mil/uL (ref 3.87–5.11)
RDW: 15.8 % — ABNORMAL HIGH (ref 11.5–15.5)
WBC: 6.8 10*3/uL (ref 4.0–10.5)

## 2022-08-17 LAB — URINE CULTURE
MICRO NUMBER:: 14978384
SPECIMEN QUALITY:: ADEQUATE

## 2022-08-17 LAB — COMPREHENSIVE METABOLIC PANEL
ALT: 14 U/L (ref 0–35)
AST: 17 U/L (ref 0–37)
Albumin: 3.9 g/dL (ref 3.5–5.2)
Alkaline Phosphatase: 48 U/L (ref 39–117)
BUN: 13 mg/dL (ref 6–23)
CO2: 25 mEq/L (ref 19–32)
Calcium: 10.6 mg/dL — ABNORMAL HIGH (ref 8.4–10.5)
Chloride: 103 mEq/L (ref 96–112)
Creatinine, Ser: 0.73 mg/dL (ref 0.40–1.20)
GFR: 88.31 mL/min (ref >60.00)
Glucose, Bld: 80 mg/dL (ref 70–99)
Potassium: 4.1 mEq/L (ref 3.5–5.1)
Sodium: 138 mEq/L (ref 135–145)
Total Bilirubin: 0.9 mg/dL (ref 0.2–1.2)
Total Protein: 6.8 g/dL (ref 6.0–8.3)

## 2022-08-17 LAB — URINALYSIS, ROUTINE W REFLEX MICROSCOPIC
Bilirubin Urine: NEGATIVE
Hgb urine dipstick: NEGATIVE
Ketones, ur: NEGATIVE
Leukocytes,Ua: NEGATIVE
Nitrite: NEGATIVE
RBC / HPF: NONE SEEN (ref 0–?)
Specific Gravity, Urine: 1.02 (ref 1.000–1.030)
Total Protein, Urine: NEGATIVE
Urine Glucose: NEGATIVE
Urobilinogen, UA: 0.2 (ref 0.0–1.0)
pH: 6.5 (ref 5.0–8.0)

## 2022-08-17 LAB — VITAMIN D 25 HYDROXY (VIT D DEFICIENCY, FRACTURES): VITD: 48.1 ng/mL (ref 30.00–100.00)

## 2022-08-17 LAB — TSH: TSH: 1.45 u[IU]/mL (ref 0.35–5.50)

## 2022-08-17 LAB — LIPID PANEL
Cholesterol: 172 mg/dL (ref 0–200)
HDL: 58.4 mg/dL (ref >39.00)
LDL Cholesterol: 82 mg/dL (ref 0–99)
NonHDL: 113.43
Total CHOL/HDL Ratio: 3
Triglycerides: 158 mg/dL — ABNORMAL HIGH (ref 0.0–149.0)
VLDL: 31.6 mg/dL (ref 0.0–40.0)

## 2022-08-17 LAB — HEMOGLOBIN A1C: Hgb A1c MFr Bld: 5.4 % (ref 4.6–6.5)

## 2022-08-17 NOTE — Telephone Encounter (Signed)
Pt stated her OBGYN told her she was to young but will get it done Year. I will send for last pap

## 2022-08-17 NOTE — Telephone Encounter (Signed)
Dr.Blyth forgot to tell this patient she is due for repeat colonoscopy this summer. I placed referral  1  Mon 5:45 PM SB also she needs a dexa if she is ok we can order

## 2022-08-19 ENCOUNTER — Telehealth: Payer: Self-pay

## 2022-08-19 NOTE — Telephone Encounter (Signed)
Called pt was advised of results  Pt stated understand,

## 2022-09-03 ENCOUNTER — Other Ambulatory Visit: Payer: Self-pay | Admitting: Family Medicine

## 2022-09-09 ENCOUNTER — Encounter: Payer: Self-pay | Admitting: Gastroenterology

## 2022-09-14 ENCOUNTER — Ambulatory Visit (HOSPITAL_BASED_OUTPATIENT_CLINIC_OR_DEPARTMENT_OTHER)
Admission: RE | Admit: 2022-09-14 | Discharge: 2022-09-14 | Disposition: A | Discharge status: Home / Self Care | Payer: PPO | Source: Ambulatory Visit | Attending: Family Medicine | Admitting: Family Medicine

## 2022-09-14 DIAGNOSIS — R0609 Other forms of dyspnea: Secondary | ICD-10-CM

## 2022-09-14 DIAGNOSIS — R0789 Other chest pain: Secondary | ICD-10-CM

## 2022-09-14 DIAGNOSIS — R0600 Dyspnea, unspecified: Secondary | ICD-10-CM | POA: Insufficient documentation

## 2022-09-14 LAB — ECHOCARDIOGRAM COMPLETE
Area-P 1/2: 4.26 cm2
S' Lateral: 2.5 cm

## 2022-09-21 ENCOUNTER — Ambulatory Visit (HOSPITAL_BASED_OUTPATIENT_CLINIC_OR_DEPARTMENT_OTHER): Payer: PPO | Admitting: Pulmonary Disease

## 2022-09-21 ENCOUNTER — Encounter (HOSPITAL_BASED_OUTPATIENT_CLINIC_OR_DEPARTMENT_OTHER): Payer: Self-pay | Admitting: Pulmonary Disease

## 2022-09-21 VITALS — BP 122/82 | HR 78 | Temp 98.3°F | Ht 63.0 in | Wt 230.0 lb

## 2022-09-21 DIAGNOSIS — R0683 Snoring: Secondary | ICD-10-CM

## 2022-09-21 NOTE — Progress Notes (Signed)
[facility]  Chief Complaint  Patient presents with   Consult    Here to talk about her sleep.     Past Surgical History:  She  has a past surgical history that includes Cardiac electrophysiology study and ablation (09/23/11); Abdominal hysterectomy (1990's); Tubal ligation (1980's); supraventricular tachycardia ablation (N/A, 09/23/2011); and Maximum access (mas)posterior lumbar interbody fusion (plif) 1 level (N/A, 09/24/2014).  Past Medical History:  Arthritis, HTN, Panic attacks, Allergies, SVT  Constitutional:  BP 122/82 (BP Location: Right Arm, Patient Position: Sitting, Cuff Size: Large)   Pulse 78   Temp 98.3 F (36.8 C) (Oral)   Ht 5\' 3"  (1.6 m)   Wt 230 lb (104.3 kg)   SpO2 98%   BMI 40.74 kg/m   Brief Summary:  Sherri Sullivan is a 62 y.o. female former smoker with snoring      Subjective:   She had surgery for carpal tunnel recently.  After surgery the anesthesia team told her she stopped breathing with low oxygen and that she should be assessed for sleep apnea.  Her husband has told her that she snores.  She wakes up hearing herself snore, and has trouble with her breathing while asleep at times.  She is a restless sleeper.  She gets leg cramps several times per week.  She will grind her teeth at time during the night.  She falls asleep easily watching TV or reading.  She goes to sleep at 1030 to 1130 pm.  She falls asleep in under 10 minutes.  She wakes up one or two times to use the bathroom.  She gets out of bed between 9 and 10 am.  She feels tired in the morning.  She denies morning headache.  She does not use anything to help her fall sleep or stay awake.  She denies sleep walking, sleep talking, bruxism, or nightmares.  There is no history of restless legs.  She denies sleep hallucinations, sleep paralysis, or cataplexy.  The Epworth score is 6 out of 24.   Physical Exam:   Appearance - well kempt   ENMT - no  sinus tenderness, no oral exudate, no LAN, Mallampati 4 airway, no stridor  Respiratory - equal breath sounds bilaterally, no wheezing or rales  CV - s1s2 regular rate and rhythm, no murmurs  Ext - no clubbing, no edema  Skin - no rashes  Psych - normal mood and affect   Sleep Tests:    Cardiac Tests:  Echo 09/14/22 >> EF 60 to 65%, mild LVH, grade 1 DD  Social History:  She  reports that she quit smoking about 38 years ago. Her smoking use included cigarettes. She has a 0.96 pack-year smoking history. She has never used smokeless tobacco. She reports current alcohol use of about 14.0 standard drinks of alcohol per week. She reports that she does not use drugs.  Family History:  Her family history includes Breast cancer in her cousin; Cancer in her sister; Diabetes in her maternal aunt, maternal uncle, and mother; Heart disease in her father and mother; Hypertension in her daughter and mother; Kidney disease in her maternal aunt; Lupus in her daughter; Obesity in her father and mother; Stroke in her maternal grandmother and mother.    Discussion:  She has snoring, sleep disruption, apnea, and daytime sleepiness.  She has history of hypertension.  Her BMI is > 35.  I am concerned she could have obstructive sleep apnea.  Assessment/Plan:  Snoring with excessive daytime sleepiness. - will need to arrange for a home sleep study  Obesity. - discussed how weight can impact sleep and risk for sleep disordered breathing - discussed options to assist with weight loss: combination of diet modification, cardiovascular and strength training exercises  Cardiovascular risk. - had an extensive discussion regarding the adverse health consequences related to untreated sleep disordered breathing - specifically discussed the risks for hypertension, coronary artery disease, cardiac dysrhythmias, cerebrovascular disease, and diabetes - lifestyle modification discussed  Safe driving practices. -  discussed how sleep disruption can increase risk of accidents, particularly when driving - safe driving practices were discussed  Therapies for obstructive sleep apnea. - if the sleep study shows significant sleep apnea, then various therapies for treatment were reviewed: CPAP, oral appliance, and surgical interventions  Time Spent Involved in Patient Care on Day of Examination:  36 minutes  Follow up:   Patient Instructions  Will arrange for a home sleep study Will call to arrange for follow up after sleep study reviewed   Medication List:   Allergies as of 09/21/2022       Reactions   Doxycycline Diarrhea, Nausea And Vomiting        Medication List        Accurate as of September 21, 2022  2:53 PM. If you have any questions, ask your nurse or doctor.          ascorbic acid 100 MG tablet Commonly known as: VITAMIN C   atorvastatin 10 MG tablet Commonly known as: LIPITOR TAKE ONE TABLET BY MOUTH ONE TIME DAILY   Benefiber Powd Take 2-3 scoop by mouth daily as needed.   D 1000 25 MCG (1000 UT) capsule Generic drug: Cholecalciferol   diclofenac 75 MG EC tablet Commonly known as: VOLTAREN Take 1 tablet by mouth twice daily   estradiol 2 MG tablet Commonly known as: ESTRACE Take 1 tablet by mouth once daily   fluticasone 50 MCG/ACT nasal spray Commonly known as: FLONASE Use 2 spray(s) in each nostril once daily   Klor-Con M20 20 MEQ tablet Generic drug: potassium chloride SA Take 1 tablet by mouth twice daily   methocarbamol 500 MG tablet Commonly known as: ROBAXIN Take 1 tablet (500 mg total) by mouth every 6 (six) hours as needed for muscle spasms.   OMEGA-3 KRILL OIL PO Take 353 mg by mouth daily. Mega[facility]   PROBIOTIC PO Take 2 tablets by mouth at bedtime.   triamterene-hydrochlorothiazide 37.5-25 MG tablet Commonly known as: MAXZIDE-25 Take 1 tablet by mouth once daily   ZINC PO        Signature:  Coralyn Helling, MD [facility]  Pulmonary/Critical Care Pager - [phone] 09/21/2022, 2:53 PM

## 2022-09-21 NOTE — Patient Instructions (Signed)
Will arrange for a home sleep study Will call to arrange for follow up after sleep study reviewed  

## 2022-09-22 ENCOUNTER — Other Ambulatory Visit: Payer: Self-pay | Admitting: Family Medicine

## 2022-09-27 NOTE — Progress Notes (Unsigned)
Cardiology Office Note:   Date:  09/29/2022  ID:  Sherri Sullivan, DOB 06/23/1960, MRN [id number]  History of Present Illness:   Sherri Sullivan is a 62 y.o. female with history of SVT, HTN, and HLD who was referred by Dr. Rogelia Rohrer for further evaluation of chest pain and DOE.  Patient seen by Dr. Rogelia Rohrer in 07/2022 where she had episodes of chest tightness following COVID as well as episodes of SOB. She is now referred to Cardiology for further evaluation.  Today, the patient states that she had COVID in 01/2022 and ever since that time period, she has been having episodes of chest tightness mainly at night. Symptoms are not exertional and mainly worse when laying on her back. Has associated SOB at that time as well as dyspnea of exertion. Her dyspnea has also been ongoing since COVID. No significant LE edema, orthopnea or PND.   TTE 08/2022: LVEF 60-65%, G1DD, normal RV, no significant valve disease, RAP .  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 Ladona Ridgel to f/u with as needed basis    Encounter for preventative adult health care exam with abnormal findings 02/09/2015   HTN (hypertension)    Hypokalemia 02/09/2015   Joint pain    Leg edema    Measles as a child   Measles as a child   Obesity    Panic attack    during episode of feeling to crowded    Preventative health care 02/09/2015   Seasonal allergies    SOB (shortness of breath) 09/23/11   "a little bit; at rest; before ablation", SOB again now (08/2014- due to lack  of exercise)    SVT (supraventricular tachycardia)    Uterine fibroid 12/17/2012   Cervical polyp per patient Follows with [facility], Dr Varney Baas     ROS: As per HPI  Studies  Reviewed:    EKG:  NSR, nonspecific t-wave abnormality  Cardiac Studies & Procedures       ECHOCARDIOGRAM  ECHOCARDIOGRAM COMPLETE 09/14/2022  Narrative ECHOCARDIOGRAM REPORT    Patient Name:   Sherri Sullivan Date of Exam: 09/14/2022 Medical Rec #:  [id number]     Height:       63.0 in Accession #:    [id number]    Weight:       231.6 lb Date of Birth:  02-16-1961     BSA:          2.058 m Patient Age:    62 years      BP:           130/72 mmHg Patient Gender: F             HR:           76 bpm. Exam Location:  [place]  Procedure: 2D Echo, Cardiac Doppler, Color Doppler and Strain Analysis  Indications:    Dyspnea, unspecified type [R06.00 (ICD-10-CM)]; Atypical chest pain [R07.89 (ICD-10-CM)]  History:        Patient has prior history of Echocardiogram examinations, most recent 12/12/2020. Signs/Symptoms:Dyspnea and Chest Pain; Risk Factors:Hypertension and Dyslipidemia.  Sonographer:  Margreta Journey RDCS Referring Phys: [phone] STACEY A BLYTH  IMPRESSIONS   1. Left ventricular ejection fraction, by estimation, is 60 to 65%. The left ventricle has normal function. The left ventricle has no regional wall motion abnormalities. There is mild left ventricular hypertrophy. Left ventricular diastolic parameters are consistent with Grade I diastolic dysfunction (impaired relaxation). The average left ventricular global longitudinal strain is -16.4 %. The global longitudinal strain is abnormal. 2. Right ventricular systolic function is normal. The right ventricular size is normal. 3. The mitral valve is normal in structure. No evidence of mitral valve regurgitation. No evidence of mitral stenosis. 4. The aortic valve is normal in structure. Aortic valve regurgitation is not visualized. No aortic stenosis is present. 5. The inferior vena cava is normal in size with greater than 50% respiratory variability, suggesting right atrial pressure of 3 mmHg.  FINDINGS Left Ventricle:  Left ventricular ejection fraction, by estimation, is 60 to 65%. The left ventricle has normal function. The left ventricle has no regional wall motion abnormalities. The average left ventricular global longitudinal strain is -16.4 %. The global longitudinal strain is abnormal. The left ventricular internal cavity size was normal in size. There is mild left ventricular hypertrophy. Left ventricular diastolic parameters are consistent with Grade I diastolic dysfunction (impaired relaxation).  Right Ventricle: The right ventricular size is normal. No increase in right ventricular wall thickness. Right ventricular systolic function is normal.  Left Atrium: Left atrial size was normal in size.  Right Atrium: Right atrial size was normal in size.  Pericardium: There is no evidence of pericardial effusion.  Mitral Valve: The mitral valve is normal in structure. No evidence of mitral valve regurgitation. No evidence of mitral valve stenosis.  Tricuspid Valve: The tricuspid valve is normal in structure. Tricuspid valve regurgitation is not demonstrated. No evidence of tricuspid stenosis.  Aortic Valve: The aortic valve is normal in structure. Aortic valve regurgitation is not visualized. No aortic stenosis is present.  Pulmonic Valve: The pulmonic valve was normal in structure. Pulmonic valve regurgitation is not visualized. No evidence of pulmonic stenosis.  Aorta: The aortic root is normal in size and structure.  Venous: The inferior vena cava is normal in size with greater than 50% respiratory variability, suggesting right atrial pressure of 3 mmHg.  IAS/Shunts: No atrial level shunt detected by color flow Doppler.   LEFT VENTRICLE PLAX 2D LVIDd:         3.60 cm   Diastology LVIDs:         2.50 cm   LV e' medial:    7.83 cm/s LV PW:         1.30 cm   LV E/e' medial:  9.8 LV IVS:        1.30 cm   LV e' lateral:   8.81 cm/s LVOT diam:     2.00 cm   LV E/e' lateral: 8.7 LV SV:          74 LV SV Index:   36        2D Longitudinal Strain LVOT Area:     3.14 cm  2D Strain GLS Avg:     -16.4 %   RIGHT VENTRICLE             IVC RV Basal diam:  2.60 cm     IVC diam: 1.60 cm RV S prime:     12.00 cm/s TAPSE (M-mode): 2.5 cm  LEFT ATRIUM  Index        RIGHT ATRIUM           Index LA diam:        3.60 cm 1.75 cm/m   RA Area:     14.20 cm LA Vol (A2C):   49.9 ml 24.25 ml/m  RA Volume:   32.90 ml  15.99 ml/m LA Vol (A4C):   39.4 ml 19.14 ml/m LA Biplane Vol: 44.3 ml 21.53 ml/m AORTIC VALVE LVOT Vmax:   103.00 cm/s LVOT Vmean:  77.000 cm/s LVOT VTI:    0.235 m  AORTA Ao Root diam: 3.20 cm Ao Asc diam:  3.50 cm Ao Desc diam: 2.20 cm  MITRAL VALVE MV Area (PHT): 4.26 cm     SHUNTS MV Decel Time: 178 msec     Systemic VTI:  0.24 m MV E velocity: 76.40 cm/s   Systemic Diam: 2.00 cm MV A velocity: 103.00 cm/s MV E/A ratio:  0.74  Gypsy Balsam MD Electronically signed by Gypsy Balsam MD Signature Date/Time: 09/14/2022/1:05:34 PM    Final              Risk Assessment/Calculations:                Physical Exam:   VS:  BP 118/74   Pulse 85   Ht 5\' 3"  (1.6 m)   Wt 230 lb 6.4 oz (104.5 kg)   SpO2 95%   BMI 40.81 kg/m    Wt Readings from Last 3 Encounters:  09/29/22 230 lb 6.4 oz (104.5 kg)  09/21/22 230 lb (104.3 kg)  08/16/22 231 lb 9.6 oz (105.1 kg)     GEN: Well nourished, well developed in no acute distress NECK: No JVD; No carotid bruits CARDIAC: RRR, no murmurs, rubs, gallops RESPIRATORY:  Clear to auscultation without rales, wheezing or rhonchi  ABDOMEN: Soft, non-tender, non-distended EXTREMITIES:  No edema; No deformity   ASSESSMENT AND PLAN:   #Atypical Chest Pain: -Has chest tightness when laying down at night that improves with sitting up -No significant symptoms with exertion -Symptoms do not sound cardiac in nature, ? Related to GERD given diet -TTE reassuring with normal EF 60-65%, G1DD, no valve  disease -Will check Ca score for CV screening -Will do trial of PPI to see if this helps symptoms  #SVT: -History of ablation in 2013 -Doing well without palpitations  #HLD: -Continue lipitor 10mg  daily -Check Ca score and will adjust lipitor as needed  #HTN: -Continue maxzide 37.5-25mg  daily  #Morbid Obesity: -Discussed lifestyle modifications as detailed below  Exercise recommendations: Goal of exercising for at least 30 minutes a day, at least 5 times per week.  Please exercise to a moderate exertion.  This means that while exercising it is difficult to speak in full sentences, however you are not so short of breath that you feel you must stop, and not so comfortable that you can carry on a full conversation.  Exertion level should be approximately a 5/10, if 10 is the most exertion you can perform.  Diet recommendations: Recommend a heart healthy diet such as the Mediterranean diet.  This diet consists of plant based foods, healthy fats, lean meats, olive oil.  It suggests limiting the intake of simple carbohydrates such as white breads, pastries, and pastas.  It also limits the amount of red meat, wine, and dairy products such as cheese that one should consume on a daily basis.         Signed, Meriam Sprague,  MD

## 2022-09-29 ENCOUNTER — Encounter: Payer: Self-pay | Admitting: Cardiology

## 2022-09-29 ENCOUNTER — Ambulatory Visit: Payer: PPO | Attending: Cardiology | Admitting: Cardiology

## 2022-09-29 VITALS — BP 118/74 | HR 85 | Ht 63.0 in | Wt 230.4 lb

## 2022-09-29 DIAGNOSIS — I10 Essential (primary) hypertension: Secondary | ICD-10-CM | POA: Diagnosis not present

## 2022-09-29 DIAGNOSIS — I471 Supraventricular tachycardia, unspecified: Secondary | ICD-10-CM | POA: Diagnosis not present

## 2022-09-29 DIAGNOSIS — E7800 Pure hypercholesterolemia, unspecified: Secondary | ICD-10-CM | POA: Diagnosis not present

## 2022-09-29 DIAGNOSIS — R0789 Other chest pain: Secondary | ICD-10-CM | POA: Diagnosis not present

## 2022-09-29 MED ORDER — PANTOPRAZOLE SODIUM 40 MG PO TBEC: 40.0000 mg | DELAYED_RELEASE_TABLET | Freq: Every day | ORAL | 3 refills | Status: DC

## 2022-09-29 MED ORDER — PANTOPRAZOLE SODIUM 40 MG PO TBEC: 40.0000 mg | DELAYED_RELEASE_TABLET | Freq: Every day | ORAL | 11 refills | Status: DC

## 2022-09-29 NOTE — Patient Instructions (Signed)
Medication Instructions:  Your physician has recommended you make the following change in your medication:   Pantoprazole 40 mg daily at bedtime  *If you need a refill on your cardiac medications before your next appointment, please call your pharmacy*  Testing/Procedures: Your physician has requested that you have a calcium score CT scan. There is a $99 fee for the scan.     Follow-Up: At [facility], you and your health needs are our priority.  As part of our continuing mission to provide you with exceptional heart care, we have created designated Provider Care Teams.  These Care Teams include your primary Cardiologist (physician) and Advanced Practice Providers (APPs -  Physician Assistants and Nurse Practitioners) who all work together to provide you with the care you need, when you need it.  Your next appointment:   As needed

## 2022-10-01 ENCOUNTER — Ambulatory Visit: Payer: PPO | Admitting: Cardiology

## 2022-10-07 DIAGNOSIS — H524 Presbyopia: Secondary | ICD-10-CM | POA: Diagnosis not present

## 2022-10-07 DIAGNOSIS — H40033 Anatomical narrow angle, bilateral: Secondary | ICD-10-CM | POA: Diagnosis not present

## 2022-10-07 DIAGNOSIS — H5203 Hypermetropia, bilateral: Secondary | ICD-10-CM | POA: Diagnosis not present

## 2022-10-07 DIAGNOSIS — H52223 Regular astigmatism, bilateral: Secondary | ICD-10-CM | POA: Diagnosis not present

## 2022-10-07 DIAGNOSIS — H40013 Open angle with borderline findings, low risk, bilateral: Secondary | ICD-10-CM | POA: Diagnosis not present

## 2022-10-07 DIAGNOSIS — H2513 Age-related nuclear cataract, bilateral: Secondary | ICD-10-CM | POA: Diagnosis not present

## 2022-10-08 ENCOUNTER — Ambulatory Visit (HOSPITAL_BASED_OUTPATIENT_CLINIC_OR_DEPARTMENT_OTHER)
Admission: RE | Admit: 2022-10-08 | Discharge: 2022-10-08 | Disposition: A | Discharge status: Home / Self Care | Payer: Self-pay | Source: Ambulatory Visit | Attending: Cardiology | Admitting: Cardiology

## 2022-10-08 ENCOUNTER — Telehealth: Payer: Self-pay

## 2022-10-08 DIAGNOSIS — E7800 Pure hypercholesterolemia, unspecified: Secondary | ICD-10-CM | POA: Insufficient documentation

## 2022-10-08 DIAGNOSIS — R0789 Other chest pain: Secondary | ICD-10-CM | POA: Insufficient documentation

## 2022-10-08 NOTE — Patient Outreach (Signed)
  Care Coordination   Initial Visit Note   10/08/2022 Name: Sherri Sullivan MRN: [id number] DOB: 07-13-1960  Sherri Sullivan is a 62 y.o. year old female who sees Bradd Canary, MD for primary care. I spoke with  [name] by phone today.  What matters to the patients health and wellness today?  RNCM called to discuss care coordination program. [name] states she does not have any disease management, care coordination or resource needs at this time. Patient to follow up with PCP if care coordination needs in the future.  Goals Addressed             This Visit's Progress    COMPLETED: Care Coordination Activities       Interventions Today    Flowsheet Row Most Recent Value  Chronic Disease   Chronic disease during today's visit Hypertension (HTN)  General Interventions   General Interventions Discussed/Reviewed General Interventions Discussed, Doctor Visits  [discussed care coordination program. patient to contact PCP if care coordination needs in the future]  Doctor Visits Discussed/Reviewed Annual Wellness Visits  [completed 08/16/22]  Pharmacy Interventions   Pharmacy Dicussed/Reviewed Pharmacy Topics Discussed  Pearletha Furl if any needs with medication management]            SDOH assessments and interventions completed:  Yes  SDOH Interventions Today    Flowsheet Row Most Recent Value  SDOH Interventions   Food Insecurity Interventions Intervention Not Indicated  Housing Interventions Intervention Not Indicated  Transportation Interventions Intervention Not Indicated  Utilities Interventions Intervention Not Indicated     Care Coordination Interventions:  Yes, provided   Follow up plan: No further intervention required.   Encounter Outcome:  Pt. Visit Completed   Kathyrn Sheriff, RN, MSN, BSN, CCM [facility] Care Coordinator [phone]

## 2022-10-11 ENCOUNTER — Ambulatory Visit: Payer: PPO

## 2022-10-11 DIAGNOSIS — R0683 Snoring: Secondary | ICD-10-CM

## 2022-10-11 DIAGNOSIS — G4733 Obstructive sleep apnea (adult) (pediatric): Secondary | ICD-10-CM

## 2022-10-18 ENCOUNTER — Telehealth: Payer: Self-pay | Admitting: Pulmonary Disease

## 2022-10-18 DIAGNOSIS — G4733 Obstructive sleep apnea (adult) (pediatric): Secondary | ICD-10-CM | POA: Diagnosis not present

## 2022-10-18 NOTE — Telephone Encounter (Signed)
HST 10/01/22 >> AHI 31.5, SpO2 low 79%  Please inform her that her sleep study shows severe obstructive sleep apnea.  Please arrange for ROV with me or NP to discuss treatment options.

## 2022-10-22 ENCOUNTER — Encounter: Payer: Self-pay | Admitting: *Deleted

## 2022-10-22 NOTE — Telephone Encounter (Signed)
Mychart msg sent to pt  She already has visit set up  Nothing further needed

## 2022-11-01 ENCOUNTER — Ambulatory Visit (AMBULATORY_SURGERY_CENTER): Payer: PPO

## 2022-11-01 VITALS — Ht 63.0 in | Wt 230.0 lb

## 2022-11-01 DIAGNOSIS — M47812 Spondylosis without myelopathy or radiculopathy, cervical region: Secondary | ICD-10-CM | POA: Insufficient documentation

## 2022-11-01 DIAGNOSIS — G5600 Carpal tunnel syndrome, unspecified upper limb: Secondary | ICD-10-CM | POA: Insufficient documentation

## 2022-11-01 DIAGNOSIS — Z86010 Personal history of colonic polyps: Secondary | ICD-10-CM

## 2022-11-01 DIAGNOSIS — M5412 Radiculopathy, cervical region: Secondary | ICD-10-CM | POA: Insufficient documentation

## 2022-11-01 DIAGNOSIS — R200 Anesthesia of skin: Secondary | ICD-10-CM | POA: Insufficient documentation

## 2022-11-01 MED ORDER — PEG 3350-KCL-NA BICARB-NACL 420 G PO SOLR
4000.0000 mL | Freq: Once | ORAL | 0 refills | Status: AC
Start: 2022-11-01 — End: 2022-11-01

## 2022-11-01 NOTE — Progress Notes (Signed)
No egg or soy allergy known to patient  No issues known to pt with past sedation with any surgeries or procedures Patient denies ever being told they had issues or difficulty with intubation  No FH of Malignant Hyperthermia Pt is not on diet pills Pt is not on  home 02  Pt is not on blood thinners  Pt denies issues with constipation  No A fib or A flutter Have any cardiac testing pending-- no  LOA: independent  Prep: [place]   Patient's chart reviewed by Sherri Sullivan CNRA prior to previsit and patient appropriate for the [facility].  Previsit completed and red dot placed by patient's name on their procedure day (on provider's schedule).     PV competed with patient. Prep instructions sent via mychart and home address.  

## 2022-11-15 ENCOUNTER — Encounter: Payer: Self-pay | Admitting: Primary Care

## 2022-11-15 ENCOUNTER — Ambulatory Visit: Payer: PPO | Admitting: Primary Care

## 2022-11-15 VITALS — BP 136/80 | HR 78 | Temp 98.3°F | Ht 63.0 in | Wt 231.2 lb

## 2022-11-15 DIAGNOSIS — G4730 Sleep apnea, unspecified: Secondary | ICD-10-CM | POA: Diagnosis not present

## 2022-11-15 NOTE — Assessment & Plan Note (Signed)
-   Patient was seen for sleep consult in June 2024 due to witnessed apneas.  She has clinical symptoms of snoring, restless sleep and daytime sleepiness. Home sleep study on 10/11/2022 showed severe obstructive sleep apnea, AHI 31/hour with SpO2 low 79%.  Reviewed risks of untreated sleep apnea and treatment options.  Due to severity of OSA recommending patient be started on PAP therapy, patient in agreement with plan.  DME ordered placed for auto CPAP 5 to 20 cm H2O with mask of choice.  Advised patient aim to wear CPAP nightly for 4 to 6 hours or longer.  Follow-up 6 to 8 weeks for CPAP compliance.

## 2022-11-15 NOTE — Patient Instructions (Addendum)
Home sleep study showed severe sleep apnea, average 31 apneas per hours  Due to the severity of your sleep apnea and clinical symptoms recommending he be started on CPAP  Aim to wear CPAP nightly for 4 to 6 hours or longer  Continue to work on weight loss efforts, focus on pain position.  Advised against driving if experiencing excessive daytime sleepiness fatigue.  Avoid excessive use of alcohol or sedatives prior to bedtime as these can worsen underlying sleep apnea  Orders Auto CPAP 5 to 20 cm H2O with mask of choice (ordered)  Follow-up Please schedule follow-up in 6 to 8 weeks with [name] for CPAP compliance   CPAP and BIPAP Information CPAP and BIPAP are methods that use air pressure to keep your airways open and to help you breathe well. CPAP and BIPAP use different amounts of pressure. Your health care provider will tell you whether CPAP or BIPAP would be more helpful for you. CPAP stands for "continuous positive airway pressure." With CPAP, the amount of pressure stays the same while you breathe in (inhale) and out (exhale). BIPAP stands for "bi-level positive airway pressure." With BIPAP, the amount of pressure will be higher when you inhale and lower when you exhale. This allows you to take larger breaths. CPAP or BIPAP may be used in the hospital, or your health care provider may want you to use it at home. You may need to have a sleep study before your health care provider can order a machine for you to use at home. What are the advantages? CPAP or BIPAP can be helpful if you have: Sleep apnea. Chronic obstructive pulmonary disease (COPD). Heart failure. Medical conditions that cause muscle weakness, including muscular dystrophy or amyotrophic lateral sclerosis (ALS). Other problems that cause breathing to be shallow, weak, abnormal, or difficult. CPAP and BIPAP are most commonly used for obstructive sleep apnea (OSA) to keep the airways from collapsing when the muscles relax  during sleep. What are the risks? Generally, this is a safe treatment. However, problems may occur, including: Irritated skin or skin sores if the mask does not fit properly. Dry or stuffy nose or nosebleeds. Dry mouth. Feeling gassy or bloated. Sinus or lung infection if the equipment is not cleaned properly. When should CPAP or BIPAP be used? In most cases, the mask only needs to be worn during sleep. Generally, the mask needs to be worn throughout the night and during any daytime naps. People with certain medical conditions may also need to wear the mask at other times, such as when they are awake. Follow instructions from your health care provider about when to use the machine. What happens during CPAP or BIPAP?  Both CPAP and BIPAP are provided by a small machine with a flexible plastic tube that attaches to a plastic mask that you wear. Air is blown through the mask into your nose or mouth. The amount of pressure that is used to blow the air can be adjusted on the machine. Your health care provider will set the pressure setting and help you find the best mask for you. Tips for using the mask Because the mask needs to be snug, some people feel trapped or closed-in (claustrophobic) when first using the mask. If you feel this way, you may need to get used to the mask. One way to do this is to hold the mask loosely over your nose or mouth and then gradually apply the mask more snugly. You can also gradually increase the  amount of time that you use the mask. Masks are available in various types and sizes. If your mask does not fit well, talk with your health care provider about getting a different one. Some common types of masks include: Full face masks, which fit over the mouth and nose. Nasal masks, which fit over the nose. Nasal pillow or prong masks, which fit into the nostrils. If you are using a mask that fits over your nose and you tend to breathe through your mouth, a chin strap may be  applied to help keep your mouth closed. Use a skin barrier to protect your skin as told by your health care provider. Some CPAP and BIPAP machines have alarms that may sound if the mask comes off or develops a leak. If you have trouble with the mask, it is very important that you talk with your health care provider about finding a way to make the mask easier to tolerate. Do not stop using the mask. There could be a negative impact on your health if you stop using the mask. Tips for using the machine Place your CPAP or BIPAP machine on a secure table or stand near an electrical outlet. Know where the on/off switch is on the machine. Follow instructions from your health care provider about how to set the pressure on your machine and when you should use it. Do not eat or drink while the CPAP or BIPAP machine is on. Food or fluids could get pushed into your lungs by the pressure of the CPAP or BIPAP. For home use, CPAP and BIPAP machines can be rented or purchased through home health care companies. Many different brands of machines are available. Renting a machine before purchasing may help you find out which particular machine works well for you. Your health insurance company may also decide which machine you may get. Keep the CPAP or BIPAP machine and attachments clean. Ask your health care provider for specific instructions. Check the humidifier if you have a dry stuffy nose or nosebleeds. Make sure it is working correctly. Follow these instructions at home: Take over-the-counter and prescription medicines only as told by your health care provider. Ask if you can take sinus medicine if your sinuses are blocked. Do not use any products that contain nicotine or tobacco. These products include cigarettes, chewing tobacco, and vaping devices, such as e-cigarettes. If you need help quitting, ask your health care provider. Keep all follow-up visits. This is important. Contact a health care provider  if: You have redness or pressure sores on your head, face, mouth, or nose from the mask or head gear. You have trouble using the CPAP or BIPAP machine. You cannot tolerate wearing the CPAP or BIPAP mask. Someone tells you that you snore even when wearing your CPAP or BIPAP. Get help right away if: You have trouble breathing. You feel confused. Summary CPAP and BIPAP are methods that use air pressure to keep your airways open and to help you breathe well. If you have trouble with the mask, it is very important that you talk with your health care provider about finding a way to make the mask easier to tolerate. Do not stop using the mask. There could be a negative impact to your health if you stop using the mask. Follow instructions from your health care provider about when to use the machine. This information is not intended to replace advice given to you by your health care provider. Make sure you discuss any questions you  have with your health care provider. Document Revised: 10/22/2020 Document Reviewed: 02/22/2020 Elsevier Patient Education  2023 [employer].

## 2022-11-15 NOTE — Progress Notes (Signed)
@Patient  ID: Sherri Sullivan, female    DOB: [birth year], 62 y.o.   MRN: [id number]  Chief Complaint  Patient presents with   Follow-up    Review HST    Referring provider: Bradd Canary, MD  HPI: 62 year old female, former smoker quit 1986.  Past medical history significant for hypertension, allergic rhinitis, supraventricular tachycardia, vit D deficiency, obesity.   Previous [facility] pulmonary encounter: She had surgery for carpal tunnel recently.  After surgery the anesthesia team told her she stopped breathing with low oxygen and that she should be assessed for sleep apnea.  Her husband has told her that she snores.  She wakes up hearing herself snore, and has trouble with her breathing while asleep at times.  She is a restless sleeper.  She gets leg cramps several times per week.  She will grind her teeth at time during the night.  She falls asleep easily watching TV or reading.  She goes to sleep at 1030 to 1130 pm.  She falls asleep in under 10 minutes.  She wakes up one or two times to use the bathroom.  She gets out of bed between 9 and 10 am.  She feels tired in the morning.  She denies morning headache.  She does not use anything to help her fall sleep or stay awake.  She denies sleep walking, sleep talking, bruxism, or nightmares.  There is no history of restless legs.  She denies sleep hallucinations, sleep paralysis, or cataplexy.  The Epworth score is 6 out of 24.   11/15/2022- interim hx  Patient presents today to review sleep study results. Seen for sleep consult on 09/21/22. She was noted to have apneas during carpal tunnel surgery. She has clinical symptoms of snoring, restless sleep and daytime sleepiness. Home sleep study on 10/11/2022 showed severe obstructive sleep apnea, AHI 31 an hour with SpO2 low 79%.  We reviewed risk of untreated sleep apnea and treatment options.  Due to severity of OSA recommending patient be started on CPAP, patient in agreement with  plan.   Allergies  Allergen Reactions   Doxycycline Diarrhea and Nausea And Vomiting    Immunization History  Administered Date(s) Administered   Influenza,inj,Quad PF,6+ Mos 02/28/2017, 01/09/2018, 12/07/2018, 02/26/2020, 11/27/2020, 12/03/2021   Influenza-Unspecified 12/27/2013, 12/25/2014, 12/22/2015, 11/27/2020   Moderna Covid-19 Vaccine Bivalent Booster 86yrs & up 04/15/2021   Moderna SARS-COV2 Booster Vaccination 03/03/2020   Moderna Sars-Covid-2 Vaccination 06/08/2019, 07/09/2019   Pneumococcal Conjugate-13 04/12/2013   Td 01/09/2018   Tdap 03/30/2007, 11/27/2020   Unspecified SARS-COV-2 Vaccination 03/03/2020   Zoster Recombinant(Shingrix) 08/09/2016, 12/16/2016    Past Medical History:  Diagnosis Date   Anginal pain (HCC)    Arthritis    L shoulder- has had injections, degenerative changes in lumbar spine    Back pain    Chicken pox as a child   Constipation    Dizziness    Dysrhythmia 2013   SVT- treated by ablation by Dr. Ladona Ridgel to f/u with as needed basis    Encounter for preventative adult health care exam with abnormal findings 02/09/2015   HTN (hypertension)    Hypokalemia 02/09/2015   Joint pain    Leg edema    Measles as a child   Measles as a child   Obesity    Panic attack    during episode of feeling to crowded    Preventative health care 02/09/2015   Seasonal allergies    SOB (shortness of breath) 09/23/2011   "a  little bit; at rest; before ablation", SOB again now (08/2014- due to lack  of exercise)    SVT (supraventricular tachycardia)    Uterine fibroid 12/17/2012   Cervical polyp per patient Follows with [facility], Dr Varney Baas    Tobacco History: Social History   Tobacco Use  Smoking Status Former   Current packs/day: 0.00   Average packs/day: 0.1 packs/day for 8.0 years (1.0 ttl pk-yrs)   Types: Cigarettes   Start date: 03/29/1976   Quit date: 03/29/1984   Years since quitting: 38.6  Smokeless Tobacco Never    Counseling given: Not Answered   Outpatient Medications Prior to Visit  Medication Sig Dispense Refill   ascorbic acid (VITAMIN C) 100 MG tablet      atorvastatin (LIPITOR) 10 MG tablet TAKE ONE TABLET BY MOUTH ONE TIME DAILY 90 tablet 0   Cholecalciferol (D 1000) 25 MCG (1000 UT) capsule      diclofenac (VOLTAREN) 75 MG EC tablet Take 1 tablet by mouth twice daily 180 tablet 0   estradiol (ESTRACE) 2 MG tablet Take 1 tablet by mouth once daily 90 tablet 0   fluticasone (FLONASE) 50 MCG/ACT nasal spray Use 2 spray(s) in each nostril once daily (Patient taking differently: Place 2 sprays into both nostrils daily as needed.) 32 g 0   KLOR-CON M20 20 MEQ tablet Take 1 tablet by mouth twice daily 180 tablet 0   methocarbamol (ROBAXIN) 500 MG tablet Take 1 tablet (500 mg total) by mouth every 6 (six) hours as needed for muscle spasms. 90 tablet 0   Multiple Vitamins-Minerals (ZINC PO)      OMEGA-3 KRILL OIL PO Take 353 mg by mouth daily. Mega[employer]     pantoprazole (PROTONIX) 40 MG tablet Take 1 tablet (40 mg total) by mouth daily. 30 tablet 3   Probiotic Product (PROBIOTIC PO) Take 1 tablet by mouth in the morning.     triamterene-hydrochlorothiazide (MAXZIDE-25) 37.5-25 MG tablet Take 1 tablet by mouth once daily 90 tablet 1   Wheat Dextrin (BENEFIBER) POWD Take 2-3 scoop by mouth daily as needed.     No facility-administered medications prior to visit.   Review of Systems  Review of Systems  Constitutional:  Positive for fatigue.  Respiratory: Negative.    Psychiatric/Behavioral:  Positive for sleep disturbance.      Physical Exam  BP 136/80 (BP Location: Right Arm, Patient Position: Sitting, Cuff Size: Large)   Pulse 78   Temp 98.3 F (36.8 C) (Oral)   Ht 5\' 3"  (1.6 m)   Wt 231 lb 3.2 oz (104.9 kg)   SpO2 97%   BMI 40.96 kg/m  Physical Exam Constitutional:      General: She is not in acute distress.    Appearance: Normal appearance. She is not ill-appearing.   HENT:     Head: Normocephalic and atraumatic.     Mouth/Throat:     Mouth: Mucous membranes are moist.     Pharynx: Oropharynx is clear.  Cardiovascular:     Rate and Rhythm: Normal rate and regular rhythm.  Pulmonary:     Effort: Pulmonary effort is normal.     Breath sounds: Normal breath sounds. No wheezing, rhonchi or rales.  Musculoskeletal:        General: Normal range of motion.  Skin:    General: Skin is warm and dry.  Neurological:     General: No focal deficit present.     Mental Status: She is alert and  oriented to person, place, and time. Mental status is at baseline.  Psychiatric:        Mood and Affect: Mood normal.        Behavior: Behavior normal.        Thought Content: Thought content normal.        Judgment: Judgment normal.      Lab Results:  CBC    Component Value Date/Time   WBC 6.8 08/16/2022 1559   RBC 4.60 08/16/2022 1559   HGB 13.6 08/16/2022 1559   HGB 13.1 01/18/2017 0955   HCT 41.7 08/16/2022 1559   HCT 39.6 01/18/2017 0955   PLT 243.0 08/16/2022 1559   MCV 90.8 08/16/2022 1559   MCV 87 01/18/2017 0955   MCH 28.8 01/18/2017 0955   MCH 27.8 01/31/2015 1401   MCHC 32.7 08/16/2022 1559   RDW 15.8 (H) 08/16/2022 1559   RDW 15.6 (H) 01/18/2017 0955   LYMPHSABS 2.9 08/16/2022 1559   LYMPHSABS 2.5 01/18/2017 0955   MONOABS 0.7 08/16/2022 1559   EOSABS 0.2 08/16/2022 1559   EOSABS 0.2 01/18/2017 0955   BASOSABS 0.1 08/16/2022 1559   BASOSABS 0.1 01/18/2017 0955    BMET    Component Value Date/Time   NA 138 08/16/2022 1559   NA 141 01/18/2017 0955   K 4.1 08/16/2022 1559   CL 103 08/16/2022 1559   CO2 25 08/16/2022 1559   GLUCOSE 80 08/16/2022 1559   BUN 13 08/16/2022 1559   BUN 13 01/18/2017 0955   CREATININE 0.73 08/16/2022 1559   CREATININE 0.66 06/17/2015 1617   CALCIUM 10.6 (H) 08/16/2022 1559   GFRNONAA 105 01/18/2017 0955   GFRNONAA >89 06/17/2015 1617   GFRAA 121 01/18/2017 0955   GFRAA >89 06/17/2015 1617     BNP No results found for: "BNP"  ProBNP No results found for: "PROBNP"  Imaging: No results found.   Assessment & Plan:   Severe sleep apnea - Patient was seen for sleep consult in June 2024 due to witnessed apneas.  She has clinical symptoms of snoring, restless sleep and daytime sleepiness. Home sleep study on 10/11/2022 showed severe obstructive sleep apnea, AHI 31/hour with SpO2 low 79%.  Reviewed risks of untreated sleep apnea and treatment options.  Due to severity of OSA recommending patient be started on PAP therapy, patient in agreement with plan.  DME ordered placed for auto CPAP 5 to 20 cm H2O with mask of choice.  Advised patient aim to wear CPAP nightly for 4 to 6 hours or longer.  Follow-up 6 to 8 weeks for CPAP compliance.   Glenford Bayley, NP 11/15/2022

## 2022-11-16 ENCOUNTER — Encounter: Payer: Self-pay | Admitting: Gastroenterology

## 2022-11-18 NOTE — Progress Notes (Signed)
Reviewed and agree with assessment/plan.   Coralyn Helling, MD [facility] Pulmonary/Critical Care 11/18/2022, 10:00 AM Pager:  [phone]

## 2022-11-19 ENCOUNTER — Other Ambulatory Visit: Payer: Self-pay | Admitting: Family Medicine

## 2022-11-30 ENCOUNTER — Other Ambulatory Visit: Payer: Self-pay | Admitting: *Deleted

## 2022-11-30 ENCOUNTER — Encounter: Payer: Self-pay | Admitting: Gastroenterology

## 2022-11-30 ENCOUNTER — Other Ambulatory Visit: Payer: Self-pay | Admitting: Family Medicine

## 2022-11-30 ENCOUNTER — Ambulatory Visit (AMBULATORY_SURGERY_CENTER): Payer: PPO | Admitting: Gastroenterology

## 2022-11-30 VITALS — BP 117/71 | HR 74 | Temp 97.1°F | Resp 15 | Ht 63.0 in | Wt 230.0 lb

## 2022-11-30 DIAGNOSIS — E669 Obesity, unspecified: Secondary | ICD-10-CM | POA: Diagnosis not present

## 2022-11-30 DIAGNOSIS — Z09 Encounter for follow-up examination after completed treatment for conditions other than malignant neoplasm: Secondary | ICD-10-CM | POA: Diagnosis not present

## 2022-11-30 DIAGNOSIS — Z86010 Personal history of colonic polyps: Secondary | ICD-10-CM

## 2022-11-30 DIAGNOSIS — D128 Benign neoplasm of rectum: Secondary | ICD-10-CM

## 2022-11-30 DIAGNOSIS — F410 Panic disorder [episodic paroxysmal anxiety] without agoraphobia: Secondary | ICD-10-CM | POA: Diagnosis not present

## 2022-11-30 MED ORDER — ATORVASTATIN CALCIUM 10 MG PO TABS: 10.0000 mg | ORAL_TABLET | Freq: Every day | ORAL | 0 refills | Status: DC

## 2022-11-30 MED ORDER — FLUTICASONE PROPIONATE 50 MCG/ACT NA SUSP: 2.0000 | Freq: Every day | NASAL | 0 refills | Status: DC

## 2022-11-30 MED ORDER — SODIUM CHLORIDE 0.9 % IV SOLN
500.0000 mL | Freq: Once | INTRAVENOUS | Status: DC
Start: 2022-11-30 — End: 2022-11-30

## 2022-11-30 NOTE — Progress Notes (Signed)
Called to room to assist during endoscopic procedure.  Patient ID and intended procedure confirmed with present staff. Received instructions for my participation in the procedure from the performing physician.  

## 2022-11-30 NOTE — Patient Instructions (Signed)
   Handouts on polyps & hemorrhoids given to you today.   Await pathology results on polyps removed   Resume previous diet & medications    YOU HAD AN ENDOSCOPIC PROCEDURE TODAY AT THE [facility]:   Refer to the procedure report that was given to you for any specific questions about what was found during the examination.  If the procedure report does not answer your questions, please call your gastroenterologist to clarify.  If you requested that your care partner not be given the details of your procedure findings, then the procedure report has been included in a sealed envelope for you to review at your convenience later.  YOU SHOULD EXPECT: Some feelings of bloating in the abdomen. Passage of more gas than usual.  Walking can help get rid of the air that was put into your GI tract during the procedure and reduce the bloating. If you had a lower endoscopy (such as a colonoscopy or flexible sigmoidoscopy) you may notice spotting of blood in your stool or on the toilet paper. If you underwent a bowel prep for your procedure, you may not have a normal bowel movement for a few days.  Please Note:  You might notice some irritation and congestion in your nose or some drainage.  This is from the oxygen used during your procedure.  There is no need for concern and it should clear up in a day or so.  SYMPTOMS TO REPORT IMMEDIATELY:  Following lower endoscopy (colonoscopy or flexible sigmoidoscopy):  Excessive amounts of blood in the stool  Significant tenderness or worsening of abdominal pains  Swelling of the abdomen that is new, acute  Fever of 100F or higher   For urgent or emergent issues, a gastroenterologist can be reached at any hour by calling [phone]. Do not use MyChart messaging for urgent concerns.    DIET:  We do recommend a small meal at first, but then you may proceed to your regular diet.  Drink plenty of fluids but you should avoid alcoholic beverages for 24  hours.  ACTIVITY:  You should plan to take it easy for the rest of today and you should NOT DRIVE or use heavy machinery until tomorrow (because of the sedation medicines used during the test).    FOLLOW UP: Our staff will call the number listed on your records the next business day following your procedure.  We will call around 7:15- 8:00 am to check on you and address any questions or concerns that you may have regarding the information given to you following your procedure. If we do not reach you, we will leave a message.     If any biopsies were taken you will be contacted by phone or by letter within the next 1-3 weeks.  Please call us at [phone] if you have not heard about the biopsies in 3 weeks.    SIGNATURES/CONFIDENTIALITY: You and/or your care partner have signed paperwork which will be entered into your electronic medical record.  These signatures attest to the fact that that the information above on your After Visit Summary has been reviewed and is understood.  Full responsibility of the confidentiality of this discharge information lies with you and/or your care-partner.

## 2022-11-30 NOTE — Op Note (Signed)
[facility] Patient Name: Sherri Sullivan Procedure Date: 11/30/2022 8:35 AM MRN: [id number] Endoscopist: Napoleon Form , MD, [id number] Age: 62 Referring MD:  Date of Birth: November 06, 1960 Gender: Female Account #: [id number] Procedure:                Colonoscopy Indications:              High risk colon cancer surveillance: Personal                            history of colonic polyps Medicines:                Monitored Anesthesia Care Procedure:                Pre-Anesthesia Assessment:                           - Prior to the procedure, a History and Physical                            was performed, and patient medications and                            allergies were reviewed. The patient's tolerance of                            previous anesthesia was also reviewed. The risks                            and benefits of the procedure and the sedation                            options and risks were discussed with the patient.                            All questions were answered, and informed consent                            was obtained. Prior Anticoagulants: The patient has                            taken no anticoagulant or antiplatelet agents. ASA                            Grade Assessment: II - A patient with mild systemic                            disease. After reviewing the risks and benefits,                            the patient was deemed in satisfactory condition to                            undergo the procedure.  After obtaining informed consent, the colonoscope                            was passed under direct vision. Throughout the                            procedure, the patient's blood pressure, pulse, and                            oxygen saturations were monitored continuously. The                            Olympus PCF-[id number] (#[id number]) Colonoscope was                            introduced through the anus and  advanced to the the                            cecum, identified by appendiceal orifice and                            ileocecal valve. The colonoscopy was performed                            without difficulty. The patient tolerated the                            procedure well. The quality of the bowel                            preparation was good. The ileocecal valve,                            appendiceal orifice, and rectum were photographed. Scope In: 8:50:49 AM Scope Out: 9:11:16 AM Scope Withdrawal Time: 0 hours 9 minutes 45 seconds  Total Procedure Duration: 0 hours 20 minutes 27 seconds  Findings:                 The perianal and digital rectal examinations were                            normal.                           A 2 mm polyp was found in the rectum. The polyp was                            sessile. The polyp was removed with a cold snare.                            Resection and retrieval were complete.                           Non-bleeding external and internal hemorrhoids were  found during retroflexion. The hemorrhoids were                            medium-sized. Complications:            No immediate complications. Estimated Blood Loss:     Estimated blood loss was minimal. Impression:               - One 2 mm polyp in the rectum, removed with a cold                            snare. Resected and retrieved.                           - Non-bleeding external and internal hemorrhoids. Recommendation:           - Patient has a contact number available for                            emergencies. The signs and symptoms of potential                            delayed complications were discussed with the                            patient. Return to normal activities tomorrow.                            Written discharge instructions were provided to the                            patient.                           - Resume previous  diet.                           - Continue present medications.                           - Await pathology results.                           - Repeat colonoscopy in 5-10 years for surveillance. Napoleon Form, MD 11/30/2022 9:22:58 AM This report has been signed electronically.

## 2022-11-30 NOTE — Progress Notes (Signed)
[facility] History and Physical   Primary Care Physician:  Bradd Canary, MD   Reason for Procedure:  History of adenomatous colon polyps  Plan:    Surveillance colonoscopy with possible interventions as needed     HPI: Sherri Sullivan is a very pleasant 61 y.o. female here for surveillance colonoscopy. Denies any nausea, vomiting, abdominal pain, melena or bright red blood per rectum  The risks and benefits as well as alternatives of endoscopic procedure(s) have been discussed and reviewed. All questions answered. The patient agrees to proceed.    Past Medical History:  Diagnosis Date   Anginal pain (HCC)    Arthritis    L shoulder- has had injections, degenerative changes in lumbar spine    Back pain    Chicken pox as a child   Constipation    Dizziness    Dysrhythmia 2013   SVT- treated by ablation by Dr. Ladona Ridgel to f/u with as needed basis    Encounter for preventative adult health care exam with abnormal findings 02/09/2015   HTN (hypertension)    Hypokalemia 02/09/2015   Joint pain    Leg edema    Measles as a child   Measles as a child   Obesity    Panic attack    during episode of feeling to crowded    Preventative health care 02/09/2015   Seasonal allergies    SOB (shortness of breath) 09/23/2011   "a little bit; at rest; before ablation", SOB again now (08/2014- due to lack  of exercise)    SVT (supraventricular tachycardia)    Uterine fibroid 12/17/2012   Cervical polyp per patient Follows with [facility], Dr Varney Baas    Past Surgical History:  Procedure Laterality Date   ABDOMINAL HYSTERECTOMY  1990's   CARDIAC ELECTROPHYSIOLOGY STUDY AND ABLATION  09/23/11   MAXIMUM ACCESS (MAS)POSTERIOR LUMBAR INTERBODY FUSION (PLIF) 1 LEVEL N/A 09/24/2014   Procedure: L5-S1 MAS PLIF ;  Surgeon: Maeola Harman, MD;  Location: [facility];  Service: Neurosurgery;  Laterality: N/A;  L5-S1 MAS PLIF fusion   SUPRAVENTRICULAR TACHYCARDIA ABLATION  N/A 09/23/2011   Procedure: SUPRAVENTRICULAR TACHYCARDIA ABLATION;  Surgeon: Marinus Maw, MD;  Location: [facility] CATH LAB;  Service: Cardiovascular;  Laterality: N/A;   TUBAL LIGATION  1980's    Prior to Admission medications   Medication Sig Start Date End Date Taking? Authorizing Provider  ascorbic acid (VITAMIN C) 100 MG tablet    Yes [provider]  atorvastatin (LIPITOR) 10 MG tablet TAKE ONE TABLET BY MOUTH ONE TIME DAILY 09/06/22  Yes Bradd Canary, MD  estradiol (ESTRACE) 2 MG tablet Take 1 tablet by mouth once daily 12/14/21  Yes Bradd Canary, MD  KLOR-CON M20 20 MEQ tablet Take 1 tablet by mouth twice daily 09/22/22  Yes Bradd Canary, MD  pantoprazole (PROTONIX) 40 MG tablet Take 1 tablet (40 mg total) by mouth daily. 09/29/22  Yes Rollene Rotunda, MD  Probiotic Product (PROBIOTIC PO) Take 1 tablet by mouth in the morning.   Yes [provider]  triamterene-hydrochlorothiazide (MAXZIDE-25) 37.5-25 MG tablet Take 1 tablet by mouth daily. 11/19/22  Yes Bradd Canary, MD  Cholecalciferol (D 1000) 25 MCG (1000 UT) capsule     [provider]  diclofenac (VOLTAREN) 75 MG EC tablet Take 1 tablet by mouth twice daily 04/29/22   Bradd Canary, MD  fluticasone [facility]) 50 MCG/ACT nasal spray Use 2 spray(s) in each nostril once daily  Patient taking differently: Place 2 sprays into both nostrils daily as needed. 09/30/21   Bradd Canary, MD  methocarbamol (ROBAXIN) 500 MG tablet Take 1 tablet (500 mg total) by mouth every 6 (six) hours as needed for muscle spasms. 01/07/22   Bradd Canary, MD  Multiple Vitamins-Minerals (ZINC PO)     [provider]  OMEGA-3 KRILL OIL PO Take 353 mg by mouth daily. [facility]    [provider]  Wheat Dextrin (BENEFIBER) POWD Take 2-3 scoop by mouth daily as needed.    [provider]    Current Outpatient Medications  Medication Sig Dispense Refill   ascorbic acid (VITAMIN C) 100 MG tablet       atorvastatin (LIPITOR) 10 MG tablet TAKE ONE TABLET BY MOUTH ONE TIME DAILY 90 tablet 0   estradiol (ESTRACE) 2 MG tablet Take 1 tablet by mouth once daily 90 tablet 0   KLOR-CON M20 20 MEQ tablet Take 1 tablet by mouth twice daily 180 tablet 0   pantoprazole (PROTONIX) 40 MG tablet Take 1 tablet (40 mg total) by mouth daily. 30 tablet 3   Probiotic Product (PROBIOTIC PO) Take 1 tablet by mouth in the morning.     triamterene-hydrochlorothiazide (MAXZIDE-25) 37.5-25 MG tablet Take 1 tablet by mouth daily. 90 tablet 1   Cholecalciferol (D 1000) 25 MCG (1000 UT) capsule      diclofenac (VOLTAREN) 75 MG EC tablet Take 1 tablet by mouth twice daily 180 tablet 0   fluticasone (FLONASE) 50 MCG/ACT nasal spray Use 2 spray(s) in each nostril once daily (Patient taking differently: Place 2 sprays into both nostrils daily as needed.) 32 g 0   methocarbamol (ROBAXIN) 500 MG tablet Take 1 tablet (500 mg total) by mouth every 6 (six) hours as needed for muscle spasms. 90 tablet 0   Multiple Vitamins-Minerals (ZINC PO)      OMEGA-3 KRILL OIL PO Take 353 mg by mouth daily. [facility] Joint [facility]     Wheat Dextrin (BENEFIBER) POWD Take 2-3 scoop by mouth daily as needed.     Current Facility-Administered Medications  Medication Dose Route Frequency Provider Last Rate Last Admin   0.9 %  sodium chloride infusion  500 mL Intravenous Once Napoleon Form, MD        Allergies as of 11/30/2022 - Review Complete 11/30/2022  Allergen Reaction Noted   Doxycycline Diarrhea and Nausea And Vomiting 08/29/2017    Family History  Problem Relation Age of Onset   Diabetes Mother        type 2   Heart disease Mother    Hypertension Mother    Stroke Mother    Obesity Mother    Heart disease Father    Obesity Father    Cancer Sister        lung   Hypertension Daughter    Lupus Daughter    Diabetes Maternal Aunt    Kidney disease Maternal Aunt    Diabetes Maternal Uncle    Stroke Maternal Grandmother     Breast cancer Cousin    Colon cancer Neg Hx    Esophageal cancer Neg Hx    Rectal cancer Neg Hx    Stomach cancer Neg Hx     Social History   Socioeconomic History   Marital status: Married    Spouse name: Maisie Fus   Number of children: 2   Years of education: Not on file   Highest education level: Not on file  Occupational  History   Occupation: Retired  Tobacco Use   Smoking status: Former    Current packs/day: 0.00    Average packs/day: 0.1 packs/day for 8.0 years (1.0 ttl pk-yrs)    Types: Cigarettes    Start date: 03/29/1976    Quit date: 03/29/1984    Years since quitting: 38.6   Smokeless tobacco: Never  Vaping Use   Vaping status: Never Used  Substance and Sexual Activity   Alcohol use: Yes    Alcohol/week: 14.0 standard drinks of alcohol    Types: 14 Glasses of wine per week    Comment: Drinks 2-3 glasses wine on the weekends   Drug use: No   Sexual activity: Not Currently  Other Topics Concern   Not on file  Social History Narrative   No major restrictions   Lives with husband, daughter, grand daughter in a 2 story home.    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   Social Determinants of Health   Financial Resource Strain: Low Risk  (03/17/2022)   Overall Financial Resource Strain (CARDIA)    Difficulty of Paying Living Expenses: Not hard at all  Food Insecurity: No Food Insecurity (10/08/2022)   Hunger Vital Sign    Worried About Running Out of Food in the Last Year: Never true    Ran Out of Food in the Last Year: Never true  Transportation Needs: No Transportation Needs (10/08/2022)   PRAPARE - Administrator, Civil Service (Medical): No    Lack of Transportation (Non-Medical): No  Physical Activity: Inactive (03/17/2022)   Exercise Vital Sign    Days of Exercise per Week: 0 days    Minutes of Exercise per Session: 0 min  Stress: No Stress Concern Present (03/17/2022)   [employer] of Occupational  Health - Occupational Stress Questionnaire    Feeling of Stress : Not at all  Social Connections: Moderately Isolated (03/17/2022)   Social Connection and Isolation Panel [NHANES]    Frequency of Communication with Friends and Family: More than three times a week    Frequency of Social Gatherings with Friends and Family: Twice a week    Attends Religious Services: Never    Database administrator or Organizations: No    Attends Banker Meetings: Never    Marital Status: Married  Catering manager Violence: Not At Risk (03/17/2022)   Humiliation, Afraid, Rape, and Kick questionnaire    Fear of Current or Ex-Partner: No    Emotionally Abused: No    Physically Abused: No    Sexually Abused: No    Review of Systems:  All other review of systems negative except as mentioned in the HPI.  Physical Exam: Vital signs in last 24 hours: BP 117/61   Pulse 78   Temp (!) 97.1 F (36.2 C)   Ht 5\' 3"  (1.6 m)   Wt 230 lb (104.3 kg)   SpO2 97%   BMI 40.74 kg/m  General:   Alert, NAD Lungs:  Clear .   Heart:  Regular rate and rhythm Abdomen:  Soft, nontender and nondistended. Neuro/Psych:  Alert and cooperative. Normal mood and affect. A and O x 3  Reviewed labs, radiology imaging, old records and pertinent past GI work up  Patient is appropriate for planned procedure(s) and anesthesia in an ambulatory setting   K. Scherry Ran , MD [phone]

## 2022-11-30 NOTE — Progress Notes (Signed)
Vss nad trans to pacu 

## 2022-11-30 NOTE — Progress Notes (Signed)
Pt's states no medical or surgical changes since previsit or office visit. 

## 2022-12-01 ENCOUNTER — Telehealth: Payer: Self-pay

## 2022-12-01 DIAGNOSIS — G4733 Obstructive sleep apnea (adult) (pediatric): Secondary | ICD-10-CM | POA: Diagnosis not present

## 2022-12-01 NOTE — Telephone Encounter (Signed)
  Follow up Call-     11/30/2022    8:16 AM  Call back number  Post procedure Call Back phone  # [phone]  Permission to leave phone message Yes     Patient questions:  Do you have a fever, pain , or abdominal swelling? No. Pain Score  0 *  Have you tolerated food without any problems? Yes.    Have you been able to return to your normal activities? Yes.    Do you have any questions about your discharge instructions: Diet   No. Medications  No. Follow up visit  No.  Do you have questions or concerns about your Care? No.  Actions: * If pain score is 4 or above: No action needed, pain <4.

## 2022-12-19 ENCOUNTER — Other Ambulatory Visit: Payer: Self-pay | Admitting: Family Medicine

## 2022-12-21 ENCOUNTER — Encounter: Payer: Self-pay | Admitting: Gastroenterology

## 2022-12-29 DIAGNOSIS — G4733 Obstructive sleep apnea (adult) (pediatric): Secondary | ICD-10-CM | POA: Diagnosis not present

## 2022-12-31 DIAGNOSIS — G4733 Obstructive sleep apnea (adult) (pediatric): Secondary | ICD-10-CM | POA: Diagnosis not present

## 2023-01-04 ENCOUNTER — Ambulatory Visit: Payer: PPO | Admitting: Primary Care

## 2023-01-04 ENCOUNTER — Encounter: Payer: Self-pay | Admitting: Primary Care

## 2023-01-04 VITALS — BP 130/80 | HR 73 | Ht 63.0 in | Wt 231.2 lb

## 2023-01-04 DIAGNOSIS — G4730 Sleep apnea, unspecified: Secondary | ICD-10-CM

## 2023-01-04 NOTE — Assessment & Plan Note (Signed)
-   Seen for sleep consult in June 2024 due to witnessed apneas. Clinical symptoms consisting of snoring, restless sleep and daytime sleepiness. HST on 10/11/22 showed severe OSA, AHI 31/hour. She was started on CPAP in August. She is 100% compliant with use >4 hours last 30 days. Current pressure 5-20cm h20; Residual AHI 0.3/hour. She is having some difficulty tolerating PAP therapy, she has ordered a new mask and we will lower max pressure settings to 15cm h20. Advised she trying increasing RAMP time on her machine. Continue to encourage she wear CPAP nightly 4-6 hours or longer. Fu in 6 months or sooner if needed.

## 2023-01-04 NOTE — Patient Instructions (Signed)
Sleep apnea is well controlled on current pressure settings. You are having no residual apneas < 1 per hours We will lower max pressure settings to help with tolerability   Recommendations: - Continue to wear CPAP nightly minimum of 4-6 hours  - Look on your machine (my settings) see if you can change ramp time to 15-30 min (likely on auto settings right now)  Orders: - We will lower max pressure setting from 20 to 15 cm h20   Follow-up: - Follow up in 6 months with [name] unless you have having issues

## 2023-01-04 NOTE — Progress Notes (Signed)
@Patient  ID: Sherri Sullivan, female    DOB: 28-Jul-1960, 62 y.o.   MRN: [id number]  Chief Complaint  Patient presents with   Follow-up    Referring provider: Bradd Canary, MD  HPI: 62 year old female, former smoker quit 1986.  Past medical history significant for hypertension, allergic rhinitis, supraventricular tachycardia, vit D deficiency, obesity.   Previous [facility] pulmonary encounter: She had surgery for carpal tunnel recently.  After surgery the anesthesia team told her she stopped breathing with low oxygen and that she should be assessed for sleep apnea.  Her husband has told her that she snores.  She wakes up hearing herself snore, and has trouble with her breathing while asleep at times.  She is a restless sleeper.  She gets leg cramps several times per week.  She will grind her teeth at time during the night.  She falls asleep easily watching TV or reading.  She goes to sleep at 1030 to 1130 pm.  She falls asleep in under 10 minutes.  She wakes up one or two times to use the bathroom.  She gets out of bed between 9 and 10 am.  She feels tired in the morning.  She denies morning headache.  She does not use anything to help her fall sleep or stay awake.  She denies sleep walking, sleep talking, bruxism, or nightmares.  There is no history of restless legs.  She denies sleep hallucinations, sleep paralysis, or cataplexy.  The Epworth score is 6 out of 24.   11/15/2022 Patient presents today to review sleep study results. Seen for sleep consult on 09/21/22. She was noted to have apneas during carpal tunnel surgery. She has clinical symptoms of snoring, restless sleep and daytime sleepiness. Home sleep study on 10/11/2022 showed severe obstructive sleep apnea, AHI 31 an hour with SpO2 low 79%.  We reviewed risk of untreated sleep apnea and treatment options.  Due to severity of OSA recommending patient be started on CPAP, patient in agreement with plan.  01/04/2023- Interim hx  Patient  presents today for 2 months follow-up OSA. Today is her first CPAP check in. She has severe sleep apnea, started on CPAP back in August. She is doing alright, having some difficulty getting used to wearing her CPAP at night. Having issues with mask fit and feels pressure can be too strong at times. She has ordered a new CPAP mask which will be delivered in the next 7-10 days. She hasn't seen much clinical improvement in sleep since stating PAP therapy but continues to be compliant with use.   Airview download 12/04/22-01/02/23 Usage days 30/30 days (100%) greater than 4 hours Average usage 6 hours 10 minutes Pressure 5 to 20 cm H2O (9.6 cm H2O-95%) Air leak 7.5 L/min (95%) AHI 0.3   Allergies  Allergen Reactions   Doxycycline Diarrhea and Nausea And Vomiting    Immunization History  Administered Date(s) Administered   Influenza,inj,Quad PF,6+ Mos 02/28/2017, 01/09/2018, 12/07/2018, 02/26/2020, 11/27/2020, 12/03/2021   Influenza-Unspecified 12/27/2013, 12/25/2014, 12/22/2015, 11/27/2020   Moderna Covid-19 Vaccine Bivalent Booster 79yrs & up 04/15/2021   Moderna SARS-COV2 Booster Vaccination 03/03/2020   Moderna Sars-Covid-2 Vaccination 06/08/2019, 07/09/2019   Pneumococcal Conjugate-13 04/12/2013   Td 01/09/2018   Tdap 03/30/2007, 11/27/2020   Unspecified SARS-COV-2 Vaccination 03/03/2020   Zoster Recombinant(Shingrix) 08/09/2016, 12/16/2016    Past Medical History:  Diagnosis Date   Anginal pain (HCC)    Arthritis    L shoulder- has had injections, degenerative changes in lumbar spine  Back pain    Chicken pox as a child   Constipation    Dizziness    Dysrhythmia 2013   SVT- treated by ablation by Dr. Ladona Ridgel to f/u with as needed basis    Encounter for preventative adult health care exam with abnormal findings 02/09/2015   HTN (hypertension)    Hypokalemia 02/09/2015   Joint pain    Leg edema    Measles as a child   Measles as a child   Obesity    Panic attack     during episode of feeling to crowded    Preventative health care 02/09/2015   Seasonal allergies    SOB (shortness of breath) 09/23/2011   "a little bit; at rest; before ablation", SOB again now (08/2014- due to lack  of exercise)    SVT (supraventricular tachycardia) (HCC)    Uterine fibroid 12/17/2012   Cervical polyp per patient Follows with [facility], Dr Varney Baas    Tobacco History: Social History   Tobacco Use  Smoking Status Former   Current packs/day: 0.00   Average packs/day: 0.1 packs/day for 8.0 years (1.0 ttl pk-yrs)   Types: Cigarettes   Start date: 03/29/1976   Quit date: 03/29/1984   Years since quitting: 38.7  Smokeless Tobacco Never   Counseling given: Not Answered   Outpatient Medications Prior to Visit  Medication Sig Dispense Refill   ascorbic acid (VITAMIN C) 100 MG tablet      atorvastatin (LIPITOR) 10 MG tablet Take 1 tablet (10 mg total) by mouth daily. 90 tablet 0   Cholecalciferol (D 1000) 25 MCG (1000 UT) capsule      diclofenac (VOLTAREN) 75 MG EC tablet Take 1 tablet by mouth twice daily 180 tablet 0   estradiol (ESTRACE) 2 MG tablet Take 1 tablet by mouth once daily 90 tablet 0   fluticasone (FLONASE) 50 MCG/ACT nasal spray Place 2 sprays into both nostrils daily. 32 g 0   KLOR-CON M20 20 MEQ tablet Take 1 tablet by mouth twice daily 180 tablet 0   methocarbamol (ROBAXIN) 500 MG tablet Take 1 tablet (500 mg total) by mouth every 6 (six) hours as needed for muscle spasms. 90 tablet 0   Multiple Vitamins-Minerals (ZINC PO)      OMEGA-3 KRILL OIL PO Take 353 mg by mouth daily. [facility]     pantoprazole (PROTONIX) 40 MG tablet Take 1 tablet (40 mg total) by mouth daily. 30 tablet 3   Probiotic Product (PROBIOTIC PO) Take 1 tablet by mouth in the morning.     triamterene-hydrochlorothiazide (MAXZIDE-25) 37.5-25 MG tablet Take 1 tablet by mouth daily. 90 tablet 1   Wheat Dextrin (BENEFIBER) POWD Take 2-3 scoop by mouth daily as  needed.     No facility-administered medications prior to visit.      Review of Systems  Review of Systems  HENT: Negative.    Respiratory: Negative.    Cardiovascular: Negative.      Physical Exam  BP 130/80 (BP Location: Left Arm, Cuff Size: Large)   Pulse 73   Ht 5\' 3"  (1.6 m)   Wt 231 lb 3.2 oz (104.9 kg)   SpO2 98%   BMI 40.96 kg/m  Physical Exam Constitutional:      Appearance: Normal appearance.  HENT:     Head: Normocephalic and atraumatic.  Cardiovascular:     Rate and Rhythm: Normal rate and regular rhythm.  Pulmonary:     Effort: Pulmonary effort is normal.  Breath sounds: Normal breath sounds.  Neurological:     General: No focal deficit present.     Mental Status: She is alert and oriented to person, place, and time. Mental status is at baseline.  Psychiatric:        Mood and Affect: Mood normal.        Behavior: Behavior normal.        Thought Content: Thought content normal.        Judgment: Judgment normal.      Lab Results:  CBC    Component Value Date/Time   WBC 6.8 08/16/2022 1559   RBC 4.60 08/16/2022 1559   HGB 13.6 08/16/2022 1559   HGB 13.1 01/18/2017 0955   HCT 41.7 08/16/2022 1559   HCT 39.6 01/18/2017 0955   PLT 243.0 08/16/2022 1559   MCV 90.8 08/16/2022 1559   MCV 87 01/18/2017 0955   MCH 28.8 01/18/2017 0955   MCH 27.8 01/31/2015 1401   MCHC 32.7 08/16/2022 1559   RDW 15.8 (H) 08/16/2022 1559   RDW 15.6 (H) 01/18/2017 0955   LYMPHSABS 2.9 08/16/2022 1559   LYMPHSABS 2.5 01/18/2017 0955   MONOABS 0.7 08/16/2022 1559   EOSABS 0.2 08/16/2022 1559   EOSABS 0.2 01/18/2017 0955   BASOSABS 0.1 08/16/2022 1559   BASOSABS 0.1 01/18/2017 0955    BMET    Component Value Date/Time   NA 138 08/16/2022 1559   NA 141 01/18/2017 0955   K 4.1 08/16/2022 1559   CL 103 08/16/2022 1559   CO2 25 08/16/2022 1559   GLUCOSE 80 08/16/2022 1559   BUN 13 08/16/2022 1559   BUN 13 01/18/2017 0955   CREATININE 0.73 08/16/2022 1559    CREATININE 0.66 06/17/2015 1617   CALCIUM 10.6 (H) 08/16/2022 1559   GFRNONAA 105 01/18/2017 0955   GFRNONAA >89 06/17/2015 1617   GFRAA 121 01/18/2017 0955   GFRAA >89 06/17/2015 1617    BNP No results found for: "BNP"  ProBNP No results found for: "PROBNP"  Imaging: No results found.   Assessment & Plan:   Severe sleep apnea - Seen for sleep consult in June 2024 due to witnessed apneas. Clinical symptoms consisting of snoring, restless sleep and daytime sleepiness. HST on 10/11/22 showed severe OSA, AHI 31/hour. She was started on CPAP in August. She is 100% compliant with use >4 hours last 30 days. Current pressure 5-20cm h20; Residual AHI 0.3/hour. She is having some difficulty tolerating PAP therapy, she has ordered a new mask and we will lower max pressure settings to 15cm h20. Advised she trying increasing RAMP time on her machine. Continue to encourage she wear CPAP nightly 4-6 hours or longer. Fu in 6 months or sooner if needed.   Glenford Bayley, NP 01/04/2023

## 2023-01-31 DIAGNOSIS — G4733 Obstructive sleep apnea (adult) (pediatric): Secondary | ICD-10-CM | POA: Diagnosis not present

## 2023-02-14 DIAGNOSIS — N952 Postmenopausal atrophic vaginitis: Secondary | ICD-10-CM | POA: Diagnosis not present

## 2023-02-14 DIAGNOSIS — N959 Unspecified menopausal and perimenopausal disorder: Secondary | ICD-10-CM | POA: Diagnosis not present

## 2023-02-14 DIAGNOSIS — Z01419 Encounter for gynecological examination (general) (routine) without abnormal findings: Secondary | ICD-10-CM | POA: Diagnosis not present

## 2023-02-16 NOTE — Assessment & Plan Note (Signed)
hgba1c acceptable, minimize simple carbs. Increase exercise as tolerated.  

## 2023-02-16 NOTE — Assessment & Plan Note (Signed)
Hydrate and monitor 

## 2023-02-16 NOTE — Assessment & Plan Note (Signed)
Encouraged DASH or MIND diet, decrease po intake and increase exercise as tolerated. Needs 7-8 hours of sleep nightly. Avoid trans fats, eat small, frequent meals every 4-5 hours with lean proteins, complex carbs and healthy fats. Minimize simple carbs 

## 2023-02-16 NOTE — Assessment & Plan Note (Signed)
Well controlled, no changes to meds. Encouraged heart healthy diet such as the DASH diet and exercise as tolerated.  °

## 2023-02-16 NOTE — Assessment & Plan Note (Signed)
Supplement and monitor 

## 2023-02-16 NOTE — Assessment & Plan Note (Signed)
Encourage heart healthy diet such as MIND or DASH diet, increase exercise, avoid trans fats, simple carbohydrates and processed foods, consider a krill or fish or flaxseed oil cap daily. Tolerating Atorvastatin 

## 2023-02-17 ENCOUNTER — Ambulatory Visit (INDEPENDENT_AMBULATORY_CARE_PROVIDER_SITE_OTHER): Payer: PPO | Admitting: Family Medicine

## 2023-02-17 VITALS — BP 144/82 | HR 58 | Temp 98.2°F | Resp 16 | Ht 63.0 in | Wt 231.0 lb

## 2023-02-17 DIAGNOSIS — Z23 Encounter for immunization: Secondary | ICD-10-CM | POA: Diagnosis not present

## 2023-02-17 DIAGNOSIS — R739 Hyperglycemia, unspecified: Secondary | ICD-10-CM

## 2023-02-17 DIAGNOSIS — Z6841 Body Mass Index (BMI) 40.0 and over, adult: Secondary | ICD-10-CM

## 2023-02-17 DIAGNOSIS — E559 Vitamin D deficiency, unspecified: Secondary | ICD-10-CM

## 2023-02-17 DIAGNOSIS — E785 Hyperlipidemia, unspecified: Secondary | ICD-10-CM | POA: Diagnosis not present

## 2023-02-17 DIAGNOSIS — R252 Cramp and spasm: Secondary | ICD-10-CM | POA: Diagnosis not present

## 2023-02-17 DIAGNOSIS — E669 Obesity, unspecified: Secondary | ICD-10-CM | POA: Diagnosis not present

## 2023-02-17 DIAGNOSIS — I10 Essential (primary) hypertension: Secondary | ICD-10-CM | POA: Diagnosis not present

## 2023-02-17 LAB — CBC WITH DIFFERENTIAL/PLATELET
Basophils Absolute: 0 10*3/uL (ref 0.0–0.1)
Basophils Relative: 0.8 % (ref 0.0–3.0)
Eosinophils Absolute: 0.3 10*3/uL (ref 0.0–0.7)
Eosinophils Relative: 5 % (ref 0.0–5.0)
HCT: 40.7 % (ref 36.0–46.0)
Hemoglobin: 13.4 g/dL (ref 12.0–15.0)
Lymphocytes Relative: 44.1 % (ref 12.0–46.0)
Lymphs Abs: 2.4 10*3/uL (ref 0.7–4.0)
MCHC: 32.9 g/dL (ref 30.0–36.0)
MCV: 90.9 fL (ref 78.0–100.0)
Monocytes Absolute: 0.4 10*3/uL (ref 0.1–1.0)
Monocytes Relative: 7.8 % (ref 3.0–12.0)
Neutro Abs: 2.3 10*3/uL (ref 1.4–7.7)
Neutrophils Relative %: 42.3 % — ABNORMAL LOW (ref 43.0–77.0)
Platelets: 240 10*3/uL (ref 150.0–400.0)
RBC: 4.47 Mil/uL (ref 3.87–5.11)
RDW: 16 % — ABNORMAL HIGH (ref 11.5–15.5)
WBC: 5.4 10*3/uL (ref 4.0–10.5)

## 2023-02-17 LAB — COMPREHENSIVE METABOLIC PANEL
ALT: 12 U/L (ref 0–35)
AST: 15 U/L (ref 0–37)
Albumin: 4 g/dL (ref 3.5–5.2)
Alkaline Phosphatase: 57 U/L (ref 39–117)
BUN: 14 mg/dL (ref 6–23)
CO2: 24 meq/L (ref 19–32)
Calcium: 10.2 mg/dL (ref 8.4–10.5)
Chloride: 105 meq/L (ref 96–112)
Creatinine, Ser: 0.61 mg/dL (ref 0.40–1.20)
GFR: 95.67 mL/min (ref >60.00)
Glucose, Bld: 98 mg/dL (ref 70–99)
Potassium: 4 meq/L (ref 3.5–5.1)
Sodium: 139 meq/L (ref 135–145)
Total Bilirubin: 0.9 mg/dL (ref 0.2–1.2)
Total Protein: 6.7 g/dL (ref 6.0–8.3)

## 2023-02-17 LAB — LIPID PANEL
Cholesterol: 185 mg/dL (ref 0–200)
HDL: 53.6 mg/dL (ref >39.00)
LDL Cholesterol: 92 mg/dL (ref 0–99)
NonHDL: 131.16
Total CHOL/HDL Ratio: 3
Triglycerides: 197 mg/dL — ABNORMAL HIGH (ref 0.0–149.0)
VLDL: 39.4 mg/dL (ref 0.0–40.0)

## 2023-02-17 LAB — MAGNESIUM: Magnesium: 1.6 mg/dL (ref 1.5–2.5)

## 2023-02-17 LAB — VITAMIN D 25 HYDROXY (VIT D DEFICIENCY, FRACTURES): VITD: 33.88 ng/mL (ref 30.00–100.00)

## 2023-02-17 LAB — TSH: TSH: 1.38 u[IU]/mL (ref 0.35–5.50)

## 2023-02-17 LAB — HEMOGLOBIN A1C: Hgb A1c MFr Bld: 5.5 % (ref 4.6–6.5)

## 2023-02-17 MED ORDER — ALPRAZOLAM 0.25 MG PO TABS: 0.1250 mg | ORAL_TABLET | Freq: Every evening | ORAL | 3 refills | Status: DC | PRN

## 2023-02-17 NOTE — Patient Instructions (Signed)
Magnesium Glycinate 200-400 mg at bedtime   Sleep Apnea  Sleep apnea is a condition that affects your breathing while you are sleeping. Your tongue or soft tissue in your throat may block the flow of air while you sleep. You may have shallow breathing or stop breathing for short periods of time. People with sleep apnea may snore loudly. There are three kinds of sleep apnea: Obstructive sleep apnea. This kind is caused by a blocked or collapsed airway. This is the most common. Central sleep apnea. This kind happens when the part of the brain that controls breathing does not send the correct signals to the muscles that control breathing. Mixed sleep apnea. This is a combination of obstructive and central sleep apnea. What are the causes? The most common cause of sleep apnea is a collapsed or blocked airway. What increases the risk? Being very overweight. Having family members with sleep apnea. Having a tongue or tonsils that are larger than normal. Having a small airway or jaw problems. Being older. What are the signs or symptoms? Loud snoring. Restless sleep. Trouble staying asleep. Being sleepy or tired during the day. Waking up gasping or choking. Having a headache in the morning. Mood swings. Having a hard time remembering things and concentrating. How is this diagnosed? A medical history. A physical exam. A sleep study. This is also called a polysomnography test. This test is done at a sleep lab or in your home while you are sleeping. How is this treated? Treatment may include: Sleeping on your side. Losing weight if you're overweight. Wearing an oral appliance. This is a mouthpiece that moves your lower jaw forward. Using a positive airway pressure (PAP) device to keep your airways open while you sleep, such as: A continuous positive airway pressure (CPAP) device. This device gives forced air through a mask when you breathe out. This keeps your airways open. A bilevel  positive airway pressure (BIPAP) device. This device gives forced air through a mask when you breathe in and when you breathe out to keep your airways open. Having surgery if other treatments do not work. If your sleep apnea is not treated, you may be at risk for: Heart failure. Heart attack. Stroke. Type 2 diabetes or a problem with your blood sugar called insulin resistance. Follow these instructions at home: Medicines Take your medicines only as told by your health care provider. Avoid alcohol, medicines to help you relax, and certain pain medicines. These may make sleep apnea worse. General instructions Do not smoke, vape, or use products with nicotine or tobacco in them. If you need help quitting, talk with your provider. If you were given a PAP device to open your airway while you sleep, use it as told by your provider. If you're having surgery, make sure to tell your provider you have sleep apnea. You may need to bring your PAP device with you. Contact a health care provider if: The PAP device that you were given to use during sleep bothers you or does not seem to be working. You do not feel better or you feel worse. Get help right away if: You have trouble breathing. You have chest pain. You have trouble talking. One side of your body feels weak. A part of your face is hanging down. These symptoms may be an emergency. Call 911 right away. Do not wait to see if the symptoms will go away. Do not drive yourself to the hospital. This information is not intended to replace advice given to  you by your health care provider. Make sure you discuss any questions you have with your health care provider. Document Revised: 05/20/2022 Document Reviewed: 05/20/2022 Elsevier Patient Education  2024 [employer].

## 2023-02-18 ENCOUNTER — Encounter: Payer: Self-pay | Admitting: Family Medicine

## 2023-02-18 LAB — PARATHYROID HORMONE, INTACT (NO CA): PTH: 65 pg/mL (ref 16–77)

## 2023-02-18 NOTE — Progress Notes (Signed)
Subjective:    Patient ID: Sherri Sullivan, female    DOB: 1960/04/18, 62 y.o.   MRN: [id number]  Chief Complaint  Patient presents with  . Follow-up    HPI Discussed the use of AI scribe software for clinical note transcription with the patient, who gave verbal consent to proceed.  History of Present Illness   The patient, with a history of sleep apnea, presents with difficulties in using their CPAP machine. They report that they have been using the machine for approximately 90 days, but are finding it hard to tolerate, particularly when lying down. The patient reports some improvement in their energy levels and clarity since starting the CPAP therapy, but is frustrated with the discomfort and inconvenience of the machine. They have tried different masks and adjustments to the machine with the help of their healthcare provider, but continue to struggle with the therapy. The patient also reports chronic congestion, which they manage with Flonase, but this may be contributing to their difficulties with the CPAP machine.  In addition to the sleep apnea, the patient has recently tested positive for hypercalcemia. They are due to have further blood tests to investigate this. The patient also mentions that their sister has recently been diagnosed with breast cancer, which is causing them some distress. No recent febrile illness or acute hospitalizations. no c/o CP/palp/SOB/GI or GU c/o.        Past Medical History:  Diagnosis Date  . Anginal pain (HCC)   . 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 Back pain   . Chicken pox as a child  . Constipation   . Dizziness   . Dysrhythmia 2013   SVT- treated by ablation by Dr. Ladona Ridgel to f/u with as needed basis   . Encounter for preventative adult health care exam with abnormal findings 02/09/2015  . HTN (hypertension)   . Hypokalemia 02/09/2015  . Joint pain   . Leg edema   . Measles as a child  .  Measles as a child  . Obesity   . Panic attack    during episode of feeling to crowded   . Preventative health care 02/09/2015  . Seasonal allergies   . SOB (shortness of breath) 09/23/2011   "a little bit; at rest; before ablation", SOB again now (08/2014- due to lack  of exercise)   . SVT (supraventricular tachycardia) (HCC)   . Uterine fibroid 12/17/2012   Cervical polyp per patient Follows with [facility], Dr Varney Baas    Past Surgical History:  Procedure Laterality Date  . ABDOMINAL HYSTERECTOMY  1990's  . CARDIAC ELECTROPHYSIOLOGY STUDY AND ABLATION  09/23/11  . MAXIMUM ACCESS (MAS)POSTERIOR LUMBAR INTERBODY FUSION (PLIF) 1 LEVEL N/A 09/24/2014   Procedure: L5-S1 MAS PLIF ;  Surgeon: Maeola Harman, MD;  Location: [facility];  Service: Neurosurgery;  Laterality: N/A;  L5-S1 MAS PLIF fusion  . SUPRAVENTRICULAR TACHYCARDIA ABLATION N/A 09/23/2011   Procedure: SUPRAVENTRICULAR TACHYCARDIA ABLATION;  Surgeon: Marinus Maw, MD;  Location: [facility] CATH LAB;  Service: Cardiovascular;  Laterality: N/A;  . TUBAL LIGATION  1980's    Family History  Problem Relation Age of Onset  . Diabetes Mother        type 2  . Heart disease Mother   . Hypertension Mother   . Stroke Mother   . Obesity Mother   . Heart disease Father   . Obesity Father   . Cancer  Sister        lung  . Hypertension Daughter   . Lupus Daughter   . Diabetes Maternal Aunt   . Kidney disease Maternal Aunt   . Diabetes Maternal Uncle   . Stroke Maternal Grandmother   . Breast cancer Cousin   . Colon cancer Neg Hx   . Esophageal cancer Neg Hx   . Rectal cancer Neg Hx   . Stomach cancer Neg Hx     Social History   Socioeconomic History  . Marital status: Married    Spouse name: Maisie Fus  . Number of children: 2  . Years of education: Not on file  . Highest education level: 12th grade  Occupational History  . Occupation: Retired  Tobacco Use  . Smoking status: Former    Current packs/day: 0.00     Average packs/day: 0.1 packs/day for 8.0 years (1.0 ttl pk-yrs)    Types: Cigarettes    Start date: 03/29/1976    Quit date: 03/29/1984    Years since quitting: 38.9  . Smokeless tobacco: Never  Vaping Use  . Vaping status: Never Used  Substance and Sexual Activity  . Alcohol use: Yes    Alcohol/week: 14.0 standard drinks of alcohol    Types: 14 Glasses of wine per week    Comment: Drinks 2-3 glasses wine on the weekends  . Drug use: No  . Sexual activity: Not Currently  Other Topics Concern  . Not on file  Social History Narrative   No major restrictions   Lives with husband, daughter, grand daughter in a 2 story home.    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   Social Determinants of Health   Financial Resource Strain: Low Risk  (02/16/2023)   Overall Financial Resource Strain (CARDIA)   . Difficulty of Paying Living Expenses: Not hard at all  Food Insecurity: No Food Insecurity (02/16/2023)   Hunger Vital Sign   . Worried About Programme researcher, broadcasting/film/video in the Last Year: Never true   . Ran Out of Food in the Last Year: Never true  Transportation Needs: No Transportation Needs (02/16/2023)   PRAPARE - Transportation   . Lack of Transportation (Medical): No   . Lack of Transportation (Non-Medical): No  Physical Activity: Inactive (02/16/2023)   Exercise Vital Sign   . Days of Exercise per Week: 0 days   . Minutes of Exercise per Session: 0 min  Stress: No Stress Concern Present (02/16/2023)   [employer] of Occupational Health - Occupational Stress Questionnaire   . Feeling of Stress : Not at all  Social Connections: Moderately Isolated (02/16/2023)   Social Connection and Isolation Panel [NHANES]   . Frequency of Communication with Friends and Family: More than three times a week   . Frequency of Social Gatherings with Friends and Family: More than three times a week   . Attends Religious Services: Never   . Active Member of Clubs or  Organizations: No   . Attends Banker Meetings: Never   . Marital Status: Married  Catering manager Violence: Not At Risk (03/17/2022)   Humiliation, Afraid, Rape, and Kick questionnaire   . Fear of Current or Ex-Partner: No   . Emotionally Abused: No   . Physically Abused: No   . Sexually Abused: No    Outpatient Medications Prior to Visit  Medication Sig Dispense Refill  . ascorbic acid (VITAMIN C) 100 MG tablet     .  atorvastatin (LIPITOR) 10 MG tablet Take 1 tablet (10 mg total) by mouth daily. 90 tablet 0  . Cholecalciferol (D 1000) 25 MCG (1000 UT) capsule     . diclofenac (VOLTAREN) 75 MG EC tablet Take 1 tablet by mouth twice daily 180 tablet 0  . estradiol (ESTRACE) 2 MG tablet Take 1 tablet by mouth once daily 90 tablet 0  . fluticasone (FLONASE) 50 MCG/ACT nasal spray Place 2 sprays into both nostrils daily. 32 g 0  . KLOR-CON M20 20 MEQ tablet Take 1 tablet by mouth twice daily 180 tablet 0  . methocarbamol (ROBAXIN) 500 MG tablet Take 1 tablet (500 mg total) by mouth every 6 (six) hours as needed for muscle spasms. 90 tablet 0  . Multiple Vitamins-Minerals (ZINC PO)     . OMEGA-3 KRILL OIL PO Take 353 mg by mouth daily. MegaRed Joint Care    . pantoprazole (PROTONIX) 40 MG tablet Take 1 tablet (40 mg total) by mouth daily. 30 tablet 3  . Probiotic Product (PROBIOTIC PO) Take 1 tablet by mouth in the morning.    . triamterene-hydrochlorothiazide (MAXZIDE-25) 37.5-25 MG tablet Take 1 tablet by mouth daily. 90 tablet 1  . Wheat Dextrin (BENEFIBER) POWD Take 2-3 scoop by mouth daily as needed.     No facility-administered medications prior to visit.    Allergies  Allergen Reactions  . Doxycycline Diarrhea and Nausea And Vomiting    Review of Systems  Constitutional:  Positive for malaise/fatigue. Negative for fever.  HENT:  Positive for congestion.   Eyes:  Negative for blurred vision.  Respiratory:  Negative for shortness of breath.   Cardiovascular:   Negative for chest pain, palpitations and leg swelling.  Gastrointestinal:  Negative for abdominal pain, blood in stool and nausea.  Genitourinary:  Negative for dysuria and frequency.  Musculoskeletal:  Negative for falls.  Skin:  Negative for rash.  Neurological:  Negative for dizziness, loss of consciousness and headaches.  Endo/Heme/Allergies:  Negative for environmental allergies.  Psychiatric/Behavioral:  Negative for depression. The patient has insomnia. The patient is not nervous/anxious.       Objective:    Physical Exam Constitutional:      General: She is not in acute distress.    Appearance: Normal appearance. She is well-developed. She is not toxic-appearing.  HENT:     Head: Normocephalic and atraumatic.     Right Ear: External ear normal.     Left Ear: External ear normal.     Nose: Nose normal.  Eyes:     General:        Right eye: No discharge.        Left eye: No discharge.     Conjunctiva/sclera: Conjunctivae normal.  Neck:     Thyroid: No thyromegaly.  Cardiovascular:     Rate and Rhythm: Normal rate and regular rhythm.     Heart sounds: Normal heart sounds. No murmur heard. Pulmonary:     Effort: Pulmonary effort is normal. No respiratory distress.     Breath sounds: Normal breath sounds.  Abdominal:     General: Bowel sounds are normal.     Palpations: Abdomen is soft.     Tenderness: There is no abdominal tenderness. There is no guarding.  Musculoskeletal:        General: Normal range of motion.     Cervical back: Neck supple.  Lymphadenopathy:     Cervical: No cervical adenopathy.  Skin:    General: Skin is warm and dry.  Neurological:     Mental Status: She is alert and oriented to person, place, and time.  Psychiatric:        Mood and Affect: Mood normal.        Behavior: Behavior normal.        Thought Content: Thought content normal.        Judgment: Judgment normal.   BP (!) 144/82 (BP Location: Right Arm, Patient Position: Sitting,  Cuff Size: Large)   Pulse (!) 58   Temp 98.2 F (36.8 C) (Oral)   Resp 16   Ht 5\' 3"  (1.6 m)   Wt 231 lb (104.8 kg)   SpO2 96%   BMI 40.92 kg/m  Wt Readings from Last 3 Encounters:  02/17/23 231 lb (104.8 kg)  01/04/23 231 lb 3.2 oz (104.9 kg)  11/30/22 230 lb (104.3 kg)    Diabetic Foot Exam - Simple   No data filed    Lab Results  Component Value Date   WBC 5.4 02/17/2023   HGB 13.4 02/17/2023   HCT 40.7 02/17/2023   PLT 240.0 02/17/2023   GLUCOSE 98 02/17/2023   CHOL 185 02/17/2023   TRIG 197.0 (H) 02/17/2023   HDL 53.60 02/17/2023   LDLDIRECT 143.0 08/29/2017   LDLCALC 92 02/17/2023   ALT 12 02/17/2023   AST 15 02/17/2023   NA 139 02/17/2023   K 4.0 02/17/2023   CL 105 02/17/2023   CREATININE 0.61 02/17/2023   BUN 14 02/17/2023   CO2 24 02/17/2023   TSH 1.38 02/17/2023   HGBA1C 5.5 02/17/2023    Lab Results  Component Value Date   TSH 1.38 02/17/2023   Lab Results  Component Value Date   WBC 5.4 02/17/2023   HGB 13.4 02/17/2023   HCT 40.7 02/17/2023   MCV 90.9 02/17/2023   PLT 240.0 02/17/2023   Lab Results  Component Value Date   NA 139 02/17/2023   K 4.0 02/17/2023   CO2 24 02/17/2023   GLUCOSE 98 02/17/2023   BUN 14 02/17/2023   CREATININE 0.61 02/17/2023   BILITOT 0.9 02/17/2023   ALKPHOS 57 02/17/2023   AST 15 02/17/2023   ALT 12 02/17/2023   PROT 6.7 02/17/2023   ALBUMIN 4.0 02/17/2023   CALCIUM 10.2 02/17/2023   ANIONGAP 9 09/13/2014   GFR 95.67 02/17/2023   Lab Results  Component Value Date   CHOL 185 02/17/2023   Lab Results  Component Value Date   HDL 53.60 02/17/2023   Lab Results  Component Value Date   LDLCALC 92 02/17/2023   Lab Results  Component Value Date   TRIG 197.0 (H) 02/17/2023   Lab Results  Component Value Date   CHOLHDL 3 02/17/2023   Lab Results  Component Value Date   HGBA1C 5.5 02/17/2023       Assessment & Plan:  Essential hypertension, benign Assessment & Plan: Well controlled, no  changes to meds. Encouraged heart healthy diet such as the DASH diet and exercise as tolerated.   Orders: -     Comprehensive metabolic panel -     CBC with Differential/Platelet -     TSH  Hyperglycemia Assessment & Plan: hgba1c acceptable, minimize simple carbs. Increase exercise as tolerated.   Orders: -     Hemoglobin A1c  Hyperlipidemia, unspecified hyperlipidemia type Assessment & Plan: Encourage heart healthy diet such as MIND or DASH diet, increase exercise, avoid trans fats, simple carbohydrates and processed foods, consider a krill or fish or flaxseed oil cap  daily. Tolerating Atorvastatin  Orders: -     Lipid panel  Muscle cramps Assessment & Plan: Hydrate and monitor   Orders: -     Magnesium  Obesity, unspecified class, unspecified obesity type, unspecified whether serious comorbidity present Assessment & Plan: Encouraged DASH or MIND diet, decrease po intake and increase exercise as tolerated. Needs 7-8 hours of sleep nightly. Avoid trans fats, eat small, frequent meals every 4-5 hours with lean proteins, complex carbs and healthy fats. Minimize simple carbs   Vitamin D deficiency Assessment & Plan: Supplement and monitor  Orders: -     DG Bone Density; Future -     VITAMIN D 25 Hydroxy (Vit-D Deficiency, Fractures)  Hypercalcemia -     Parathyroid hormone, intact (no Ca) -     DG Bone Density; Future  Need for influenza vaccination -     Flu vaccine trivalent PF, 6mos and older(Flulaval,Afluria,Fluarix,Fluzone)  Other orders -     ALPRAZolam; Take 0.5-1 tablets (0.125-0.25 mg total) by mouth at bedtime as needed for anxiety.  Dispense: 30 tablet; Refill: 3    Assessment and Plan    Obstructive Sleep Apnea Recently diagnosed and struggling with CPAP machine use due to discomfort and nasal congestion. Noted some improvement in symptoms with use. Currently under the care of NP Clent Ridges for management. -Continue CPAP use and follow-up with NP Clent Ridges at  pulmonary for further adjustments as needed. -Consider use of nasal saline at night to help with congestion. -Consider use of Alprazolam 0.25mg  at bedtime to help with CPAP tolerance, starting with half a tablet and adjusting as needed. Monitor for dependency.  Hypercalcemia Previous high calcium level noted. No family history of hypercalcemia. -Order blood work including repeat calcium and PTH levels. -Consider further investigation if calcium remains high.  Hypertension Elevated blood pressure noted during visit, possibly due to missed morning medication. Reports better control at home. -Check blood pressure at home twice a week for the next 2-4 weeks and report readings. -Recheck in office in 3 months.  General Health Maintenance -Order DEXA scan for bone density assessment. -Schedule follow-up visit in 3 months and physical in 6 months. -Continue current regimen of Flu vaccine, Flonase, and other medications as prescribed.         Danise Edge, MD

## 2023-02-22 ENCOUNTER — Telehealth (HOSPITAL_BASED_OUTPATIENT_CLINIC_OR_DEPARTMENT_OTHER): Payer: Self-pay | Admitting: Family Medicine

## 2023-02-23 ENCOUNTER — Other Ambulatory Visit: Payer: Self-pay | Admitting: Family Medicine

## 2023-02-28 ENCOUNTER — Ambulatory Visit (HOSPITAL_BASED_OUTPATIENT_CLINIC_OR_DEPARTMENT_OTHER)
Admission: RE | Admit: 2023-02-28 | Discharge: 2023-02-28 | Disposition: A | Discharge status: Home / Self Care | Payer: PPO | Source: Ambulatory Visit | Attending: Family Medicine | Admitting: Family Medicine

## 2023-02-28 ENCOUNTER — Encounter: Payer: Self-pay | Admitting: Family Medicine

## 2023-02-28 DIAGNOSIS — Z13820 Encounter for screening for osteoporosis: Secondary | ICD-10-CM | POA: Insufficient documentation

## 2023-02-28 DIAGNOSIS — G4733 Obstructive sleep apnea (adult) (pediatric): Secondary | ICD-10-CM | POA: Diagnosis not present

## 2023-02-28 DIAGNOSIS — E559 Vitamin D deficiency, unspecified: Secondary | ICD-10-CM | POA: Insufficient documentation

## 2023-02-28 DIAGNOSIS — Z87891 Personal history of nicotine dependence: Secondary | ICD-10-CM | POA: Diagnosis not present

## 2023-02-28 DIAGNOSIS — Z780 Asymptomatic menopausal state: Secondary | ICD-10-CM | POA: Insufficient documentation

## 2023-03-01 ENCOUNTER — Other Ambulatory Visit: Payer: Self-pay | Admitting: Family Medicine

## 2023-03-01 MED ORDER — ALBUTEROL SULFATE HFA 108 (90 BASE) MCG/ACT IN AERS: 2.0000 | INHALATION_SPRAY | Freq: Four times a day (QID) | RESPIRATORY_TRACT | 1 refills | Status: DC | PRN

## 2023-03-02 DIAGNOSIS — G4733 Obstructive sleep apnea (adult) (pediatric): Secondary | ICD-10-CM | POA: Diagnosis not present

## 2023-03-07 ENCOUNTER — Encounter: Payer: Self-pay | Admitting: *Deleted

## 2023-03-07 DIAGNOSIS — G4733 Obstructive sleep apnea (adult) (pediatric): Secondary | ICD-10-CM | POA: Diagnosis not present

## 2023-03-16 ENCOUNTER — Encounter: Payer: Self-pay | Admitting: *Deleted

## 2023-03-22 ENCOUNTER — Other Ambulatory Visit: Payer: Self-pay | Admitting: Family Medicine

## 2023-03-25 ENCOUNTER — Ambulatory Visit: Payer: PPO

## 2023-03-28 ENCOUNTER — Ambulatory Visit (INDEPENDENT_AMBULATORY_CARE_PROVIDER_SITE_OTHER): Payer: PPO | Admitting: *Deleted

## 2023-03-28 DIAGNOSIS — Z0000 Encounter for general adult medical examination without abnormal findings: Secondary | ICD-10-CM

## 2023-03-28 NOTE — Patient Instructions (Signed)
Ms. Helme , Thank you for taking time to come for your Medicare Wellness Visit. I appreciate your ongoing commitment to your health goals. Please review the following plan we discussed and let me know if I can assist you in the future.     This is a list of the screening recommended for you and due dates:  Health Maintenance  Topic Date Due   COVID-19 Vaccine (6 - 2024-25 season) 11/28/2022   Pap with HPV screening  01/08/2024   Medicare Annual Wellness Visit  03/27/2024   Mammogram  06/20/2024   DTaP/Tdap/Td vaccine (4 - Td or Tdap) 11/28/2030   Colon Cancer Screening  11/29/2032   Flu Shot  Completed   Hepatitis C Screening  Completed   Zoster (Shingles) Vaccine  Completed   HPV Vaccine  Aged Out   HIV Screening  Discontinued     Next appointment: Follow up in one year for your annual wellness visit.   Preventive Care 40-64 Years, Female Preventive care refers to lifestyle choices and visits with your health care provider that can promote health and wellness. What does preventive care include? A yearly physical exam. This is also called an annual well check. Dental exams once or twice a year. Routine eye exams. Ask your health care provider how often you should have your eyes checked. Personal lifestyle choices, including: Daily care of your teeth and gums. Regular physical activity. Eating a healthy diet. Avoiding tobacco and drug use. Limiting alcohol use. Practicing safe sex. Taking low-dose aspirin daily starting at age 51. Taking vitamin and mineral supplements as recommended by your health care provider. What happens during an annual well check? The services and screenings done by your health care provider during your annual well check will depend on your age, overall health, lifestyle risk factors, and family history of disease. Counseling  Your health care provider may ask you questions about your: Alcohol use. Tobacco use. Drug use. Emotional  well-being. Home and relationship well-being. Sexual activity. Eating habits. Work and work Astronomer. Method of birth control. Menstrual cycle. Pregnancy history. Screening  You may have the following tests or measurements: Height, weight, and BMI. Blood pressure. Lipid and cholesterol levels. These may be checked every 5 years, or more frequently if you are over 64 years old. Skin check. Lung cancer screening. You may have this screening every year starting at age 18 if you have a 30-pack-year history of smoking and currently smoke or have quit within the past 15 years. Fecal occult blood test (FOBT) of the stool. You may have this test every year starting at age 1. Flexible sigmoidoscopy or colonoscopy. You may have a sigmoidoscopy every 5 years or a colonoscopy every 10 years starting at age 82. Hepatitis C blood test. Hepatitis B blood test. Sexually transmitted disease (STD) testing. Diabetes screening. This is done by checking your blood sugar (glucose) after you have not eaten for a while (fasting). You may have this done every 1-3 years. Mammogram. This may be done every 1-2 years. Talk to your health care provider about when you should start having regular mammograms. This may depend on whether you have a family history of breast cancer. BRCA-related cancer screening. This may be done if you have a family history of breast, ovarian, tubal, or peritoneal cancers. Pelvic exam and Pap test. This may be done every 3 years starting at age 48. Starting at age 31, this may be done every 5 years if you have a Pap test in combination  with an HPV test. Bone density scan. This is done to screen for osteoporosis. You may have this scan if you are at high risk for osteoporosis. Discuss your test results, treatment options, and if necessary, the need for more tests with your health care provider. Vaccines  Your health care provider may recommend certain vaccines, such as: Influenza  vaccine. This is recommended every year. Tetanus, diphtheria, and acellular pertussis (Tdap, Td) vaccine. You may need a Td booster every 10 years. Zoster vaccine. You may need this after age 35. Pneumococcal 13-valent conjugate (PCV13) vaccine. You may need this if you have certain conditions and were not previously vaccinated. Pneumococcal polysaccharide (PPSV23) vaccine. You may need one or two doses if you smoke cigarettes or if you have certain conditions. Talk to your health care provider about which screenings and vaccines you need and how often you need them. This information is not intended to replace advice given to you by your health care provider. Make sure you discuss any questions you have with your health care provider. Document Released: 04/11/2015 Document Revised: 12/03/2015 Document Reviewed: 01/14/2015 Elsevier Interactive Patient Education  2017 [employer].    Fall Prevention in the Home Falls can cause injuries. They can happen to people of all ages. There are many things you can do to make your home safe and to help prevent falls. What can I do on the outside of my home? Regularly fix the edges of walkways and driveways and fix any cracks. Remove anything that might make you trip as you walk through a door, such as a raised step or threshold. Trim any bushes or trees on the path to your home. Use bright outdoor lighting. Clear any walking paths of anything that might make someone trip, such as rocks or tools. Regularly check to see if handrails are loose or broken. Make sure that both sides of any steps have handrails. Any raised decks and porches should have guardrails on the edges. Have any leaves, snow, or ice cleared regularly. Use sand or salt on walking paths during winter. Clean up any spills in your garage right away. This includes oil or grease spills. What can I do in the bathroom? Use night lights. Install grab bars by the toilet and in the tub and  shower. Do not use towel bars as grab bars. Use non-skid mats or decals in the tub or shower. If you need to sit down in the shower, use a plastic, non-slip stool. Keep the floor dry. Clean up any water that spills on the floor as soon as it happens. Remove soap buildup in the tub or shower regularly. Attach bath mats securely with double-sided non-slip rug tape. Do not have throw rugs and other things on the floor that can make you trip. What can I do in the bedroom? Use night lights. Make sure that you have a light by your bed that is easy to reach. Do not use any sheets or blankets that are too big for your bed. They should not hang down onto the floor. Have a firm chair that has side arms. You can use this for support while you get dressed. Do not have throw rugs and other things on the floor that can make you trip. What can I do in the kitchen? Clean up any spills right away. Avoid walking on wet floors. Keep items that you use a lot in easy-to-reach places. If you need to reach something above you, use a strong step stool that  has a grab bar. Keep electrical cords out of the way. Do not use floor polish or wax that makes floors slippery. If you must use wax, use non-skid floor wax. Do not have throw rugs and other things on the floor that can make you trip. What can I do with my stairs? Do not leave any items on the stairs. Make sure that there are handrails on both sides of the stairs and use them. Fix handrails that are broken or loose. Make sure that handrails are as long as the stairways. Check any carpeting to make sure that it is firmly attached to the stairs. Fix any carpet that is loose or worn. Avoid having throw rugs at the top or bottom of the stairs. If you do have throw rugs, attach them to the floor with carpet tape. Make sure that you have a light switch at the top of the stairs and the bottom of the stairs. If you do not have them, ask someone to add them for  you. What else can I do to help prevent falls? Wear shoes that: Do not have high heels. Have rubber bottoms. Are comfortable and fit you well. Are closed at the toe. Do not wear sandals. If you use a stepladder: Make sure that it is fully opened. Do not climb a closed stepladder. Make sure that both sides of the stepladder are locked into place. Ask someone to hold it for you, if possible. Clearly mark and make sure that you can see: Any grab bars or handrails. First and last steps. Where the edge of each step is. Use tools that help you move around (mobility aids) if they are needed. These include: Canes. Walkers. Scooters. Crutches. Turn on the lights when you go into a dark area. Replace any light bulbs as soon as they burn out. Set up your furniture so you have a clear path. Avoid moving your furniture around. If any of your floors are uneven, fix them. If there are any pets around you, be aware of where they are. Review your medicines with your doctor. Some medicines can make you feel dizzy. This can increase your chance of falling. Ask your doctor what other things that you can do to help prevent falls. This information is not intended to replace advice given to you by your health care provider. Make sure you discuss any questions you have with your health care provider. Document Released: 01/09/2009 Document Revised: 08/21/2015 Document Reviewed: 04/19/2014 Elsevier Interactive Patient Education  2017 [employer].

## 2023-03-28 NOTE — Progress Notes (Signed)
Subjective:   Sherri Sullivan is a 62 y.o. female who presents for Medicare Annual (Subsequent) preventive examination.  Visit Complete: Virtual I connected with  PARIS LAL on 03/28/23 by a audio enabled telemedicine application and verified that I am speaking with the correct person using two identifiers.  Patient Location: Home  Provider Location: Office/Clinic  I discussed the limitations of evaluation and management by telemedicine. The patient expressed understanding and agreed to proceed.  Vital Signs: Because this visit was a virtual/telehealth visit, some criteria may be missing or patient reported. Any vitals not documented were not able to be obtained and vitals that have been documented are patient reported.   Cardiac Risk Factors include: hypertension;dyslipidemia     Objective:    Today's Vitals   03/28/23 1423  PainSc: 7    There is no height or weight on file to calculate BMI.     03/28/2023    2:23 PM 03/17/2022    3:21 PM 11/18/2020    2:45 PM 12/02/2015    2:05 PM 09/13/2014    3:39 PM 09/23/2011    7:11 PM 09/23/2011    3:19 PM  Advanced Directives  Does Patient Have a Medical Advance Directive? Yes Yes Yes Yes Yes Patient has advance directive, copy not in chart Patient does not have advance directive  Type of Advance Directive Healthcare Power of [place];Living will Healthcare Power of [place];Living will Healthcare Power of [place];Living will Healthcare Power of [place];Living will Healthcare Power of [place];Living will Healthcare Power of [place];Living will   Does patient want to make changes to medical advance directive? No - Patient declined No - Patient declined No - Patient declined No - Patient declined     Copy of Healthcare Power of Attorney in Chart? Yes - validated most recent copy scanned in chart (See row information) Yes - validated most recent copy scanned in chart (See row information) Yes - validated most recent copy scanned in  chart (See row information) No - copy requested  Copy requested from other (Comment)   Pre-existing out of facility DNR order (yellow form or pink MOST form)       No    Current Medications (verified) Outpatient Encounter Medications as of 03/28/2023  Medication Sig   albuterol (VENTOLIN HFA) 108 (90 Base) MCG/ACT inhaler Inhale 2 puffs into the lungs every 6 (six) hours as needed for wheezing or shortness of breath.   ALPRAZolam (XANAX) 0.25 MG tablet Take 0.5-1 tablets (0.125-0.25 mg total) by mouth at bedtime as needed for anxiety.   ascorbic acid (VITAMIN C) 100 MG tablet    atorvastatin (LIPITOR) 10 MG tablet Take 1 tablet (10 mg total) by mouth daily.   Cholecalciferol (D 1000) 25 MCG (1000 UT) capsule    diclofenac (VOLTAREN) 75 MG EC tablet Take 1 tablet by mouth twice daily   estradiol (ESTRACE) 2 MG tablet Take 1 tablet by mouth once daily   fluticasone (FLONASE) 50 MCG/ACT nasal spray Place 2 sprays into both nostrils daily.   KLOR-CON M20 20 MEQ tablet Take 1 tablet by mouth twice daily   methocarbamol (ROBAXIN) 500 MG tablet Take 1 tablet (500 mg total) by mouth every 6 (six) hours as needed for muscle spasms.   Multiple Vitamins-Minerals (ZINC PO)    OMEGA-3 KRILL OIL PO Take 353 mg by mouth daily. Mega[facility]   pantoprazole (PROTONIX) 40 MG tablet Take 1 tablet (40 mg total) by mouth daily.   Probiotic Product (PROBIOTIC PO)  Take 1 tablet by mouth in the morning.   triamterene-hydrochlorothiazide (MAXZIDE-25) 37.5-25 MG tablet Take 1 tablet by mouth daily.   Wheat Dextrin (BENEFIBER) POWD Take 2-3 scoop by mouth daily as needed.   No facility-administered encounter medications on file as of 03/28/2023.    Allergies (verified) Doxycycline   History: Past Medical History:  Diagnosis Date   Anginal pain (HCC)    Arthritis    L shoulder- has had injections, degenerative changes in lumbar spine    Back pain    Chicken pox as a child   Constipation    Dizziness     Dysrhythmia 2013   SVT- treated by ablation by Dr. Ladona Ridgel to f/u with as needed basis    Encounter for preventative adult health care exam with abnormal findings 02/09/2015   HTN (hypertension)    Hypokalemia 02/09/2015   Joint pain    Leg edema    Measles as a child   Measles as a child   Obesity    Panic attack    during episode of feeling to crowded    Preventative health care 02/09/2015   Seasonal allergies    SOB (shortness of breath) 09/23/2011   "a little bit; at rest; before ablation", SOB again now (08/2014- due to lack  of exercise)    SVT (supraventricular tachycardia) (HCC)    Uterine fibroid 12/17/2012   Cervical polyp per patient Follows with [facility], Dr Varney Baas   Past Surgical History:  Procedure Laterality Date   ABDOMINAL HYSTERECTOMY  1990's   CARDIAC ELECTROPHYSIOLOGY STUDY AND ABLATION  09/23/11   MAXIMUM ACCESS (MAS)POSTERIOR LUMBAR INTERBODY FUSION (PLIF) 1 LEVEL N/A 09/24/2014   Procedure: L5-S1 MAS PLIF ;  Surgeon: Maeola Harman, MD;  Location: [facility];  Service: Neurosurgery;  Laterality: N/A;  L5-S1 MAS PLIF fusion   SUPRAVENTRICULAR TACHYCARDIA ABLATION N/A 09/23/2011   Procedure: SUPRAVENTRICULAR TACHYCARDIA ABLATION;  Surgeon: Marinus Maw, MD;  Location: [facility] CATH LAB;  Service: Cardiovascular;  Laterality: N/A;   TUBAL LIGATION  1980's   Family History  Problem Relation Age of Onset   Diabetes Mother        type 2   Heart disease Mother    Hypertension Mother    Stroke Mother    Obesity Mother    Heart disease Father    Obesity Father    Cancer Sister        lung   Hypertension Daughter    Lupus Daughter    Diabetes Maternal Aunt    Kidney disease Maternal Aunt    Diabetes Maternal Uncle    Stroke Maternal Grandmother    Breast cancer Cousin    Colon cancer Neg Hx    Esophageal cancer Neg Hx    Rectal cancer Neg Hx    Stomach cancer Neg Hx    Social History   Socioeconomic History   Marital status: Married     Spouse name: Maisie Fus   Number of children: 2   Years of education: Not on file   Highest education level: 12th grade  Occupational History   Occupation: Retired  Tobacco Use   Smoking status: Former    Current packs/day: 0.00    Average packs/day: 0.1 packs/day for 8.0 years (1.0 ttl pk-yrs)    Types: Cigarettes    Start date: 03/29/1976    Quit date: 03/29/1984    Years since quitting: 39.0   Smokeless tobacco: Never  Vaping Use   Vaping status: Never  Used  Substance and Sexual Activity   Alcohol use: Yes    Alcohol/week: 14.0 standard drinks of alcohol    Types: 14 Glasses of wine per week    Comment: Drinks 2-3 glasses wine on the weekends   Drug use: No   Sexual activity: Not Currently  Other Topics Concern   Not on file  Social History Narrative   No major restrictions   Lives with husband, daughter, grand daughter in a 2 story home.    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   Social Drivers of Corporate investment banker Strain: Low Risk  (03/28/2023)   Overall Financial Resource Strain (CARDIA)    Difficulty of Paying Living Expenses: Not hard at all  Food Insecurity: No Food Insecurity (03/28/2023)   Hunger Vital Sign    Worried About Running Out of Food in the Last Year: Never true    Ran Out of Food in the Last Year: Never true  Transportation Needs: No Transportation Needs (03/28/2023)   PRAPARE - Administrator, Civil Service (Medical): No    Lack of Transportation (Non-Medical): No  Physical Activity: Inactive (03/28/2023)   Exercise Vital Sign    Days of Exercise per Week: 0 days    Minutes of Exercise per Session: 0 min  Stress: Stress Concern Present (03/28/2023)   [employer] of Occupational Health - Occupational Stress Questionnaire    Feeling of Stress : To some extent  Social Connections: Moderately Isolated (03/28/2023)   Social Connection and Isolation Panel [NHANES]    Frequency of  Communication with Friends and Family: More than three times a week    Frequency of Social Gatherings with Friends and Family: Twice a week    Attends Religious Services: Never    Database administrator or Organizations: No    Attends Engineer, structural: Never    Marital Status: Married    Tobacco Counseling Counseling given: Not Answered   Clinical Intake:  Pre-visit preparation completed: Yes  Pain : 0-10 Pain Score: 7  Pain Type: Chronic pain Pain Location: Hip Pain Orientation: Right Pain Descriptors / Indicators: Aching Pain Onset: More than a month ago Pain Frequency: Intermittent  Nutritional Risks: None Diabetes: No  How often do you need to have someone help you when you read instructions, pamphlets, or other written materials from your doctor or pharmacy?: 1 - Never  Interpreter Needed?: No  Information entered by :: [employer], CMA   Activities of Daily Living    03/28/2023    2:26 PM  In your present state of health, do you have any difficulty performing the following activities:  Hearing? 0  Vision? 0  Difficulty concentrating or making decisions? 0  Walking or climbing stairs? 0  Dressing or bathing? 0  Doing errands, shopping? 0  Preparing Food and eating ? N  Using the Toilet? N  In the past six months, have you accidently leaked urine? Y  Comment stress incontinence  Do you have problems with loss of bowel control? N  Managing your Medications? N  Managing your Finances? N  Housekeeping or managing your Housekeeping? N    Patient Care Team: Bradd Canary, MD as PCP - General (Family Medicine) Lenda Kelp, MD as Consulting Physician (Sports Medicine) Maeola Harman, MD as Consulting Physician (Neurosurgery) Robyn Haber, Karl Luke, MD as Referring Physician (Ophthalmology) Harold Hedge, MD as Consulting Physician (Obstetrics and  Gynecology)  Indicate any recent Medical Services you may have received from other  than [facility] providers in the past year (date may be approximate).     Assessment:   This is a routine wellness examination for [place].  Hearing/Vision screen No results found.   Goals Addressed   None    Depression Screen    03/28/2023    2:34 PM 08/16/2022    3:02 PM 03/17/2022    3:20 PM 12/03/2021    1:16 PM 11/27/2020    2:08 PM 11/16/2019   12:01 PM 01/18/2017    8:54 AM  PHQ 2/9 Scores  PHQ - 2 Score 0 0 0 0 0 0 1  PHQ- 9 Score  0  0 0  6    Fall Risk    03/28/2023    2:30 PM 08/16/2022    3:02 PM 03/17/2022    3:22 PM 03/16/2022   11:36 AM 12/03/2021    1:16 PM  Fall Risk   Falls in the past year? 0 0 0 0   Number falls in past yr: 0 0 0    Injury with Fall? 0 0 0  0  Risk for fall due to : No Fall Risks  Impaired vision    Follow up Falls evaluation completed Falls evaluation completed Falls prevention discussed  Falls evaluation completed    MEDICARE RISK AT HOME: Medicare Risk at Home Any stairs in or around the home?: Yes If so, are there any without handrails?: No Home free of loose throw rugs in walkways, pet beds, electrical cords, etc?: Yes Adequate lighting in your home to reduce risk of falls?: Yes Life alert?: No Use of a cane, walker or w/c?: No Grab bars in the bathroom?: No Shower chair or bench in shower?: Yes (built in bench) Elevated toilet seat or a handicapped toilet?: Yes  TIMED UP AND GO:  Was the test performed?  No    Cognitive Function:        03/28/2023    2:35 PM 03/17/2022    3:23 PM  6CIT Screen  What Year? 0 points 0 points  What month? 0 points 0 points  What time? 0 points 0 points  Count back from 20 0 points 0 points  Months in reverse 0 points 0 points  Repeat phrase 0 points 0 points  Total Score 0 points 0 points    Immunizations Immunization History  Administered Date(s) Administered   Influenza, Seasonal, Injecte, Preservative Fre 02/17/2023   Influenza,inj,Quad PF,6+ Mos 02/28/2017, 01/09/2018,  12/07/2018, 02/26/2020, 11/27/2020, 12/03/2021   Influenza-Unspecified 12/27/2013, 12/25/2014, 12/22/2015, 11/27/2020   Moderna Covid-19 Vaccine Bivalent Booster 10yrs & up 04/15/2021   Moderna SARS-COV2 Booster Vaccination 03/03/2020   Moderna Sars-Covid-2 Vaccination 06/08/2019, 07/09/2019   Pneumococcal Conjugate-13 04/12/2013   Td 01/09/2018   Tdap 03/30/2007, 11/27/2020   Unspecified SARS-COV-2 Vaccination 03/03/2020   Zoster Recombinant(Shingrix) 08/09/2016, 12/16/2016    TDAP status: Up to date  Flu Vaccine status: Up to date  Pneumococcal vaccine status: Up to date  Covid-19 vaccine status: Information provided on how to obtain vaccines.   Qualifies for Shingles Vaccine? Yes   Zostavax completed No   Shingrix Completed?: Yes  Screening Tests Health Maintenance  Topic Date Due   COVID-19 Vaccine (6 - 2024-25 season) 11/28/2022   Medicare Annual Wellness (AWV)  03/18/2023   Cervical Cancer Screening (HPV/Pap Cotest)  01/08/2024   MAMMOGRAM  06/20/2024   DTaP/Tdap/Td (4 - Td or Tdap) 11/28/2030   Colonoscopy  11/29/2032   INFLUENZA VACCINE  Completed   Hepatitis C Screening  Completed   Zoster Vaccines- Shingrix  Completed   HPV VACCINES  Aged Out   HIV Screening  Discontinued    Health Maintenance  Health Maintenance Due  Topic Date Due   COVID-19 Vaccine (6 - 2024-25 season) 11/28/2022   Medicare Annual Wellness (AWV)  03/18/2023    Colorectal cancer screening: Type of screening: Colonoscopy. Completed 11/30/22. Repeat every 10 years  Mammogram status: Completed 06/21/22. Repeat every year  Bone Density status: due at age 51  Lung Cancer Screening: (Low Dose CT Chest recommended if Age 80-80 years, 20 pack-year currently smoking OR have quit w/in 15years.) does not qualify.   Additional Screening:  Hepatitis C Screening: does qualify; Completed 01/09/18  Vision Screening: Recommended annual ophthalmology exams for early detection of glaucoma and other  disorders of the eye. Is the patient up to date with their annual eye exam?  Yes  Who is the provider or what is the name of the office in which the patient attends annual eye exams? [facility]. If pt is not established with a provider, would they like to be referred to a provider to establish care? No .   Dental Screening: Recommended annual dental exams for proper oral hygiene  Diabetic Foot Exam: N/a  Community Resource Referral / Chronic Care Management: CRR required this visit?  No   CCM required this visit?  No     Plan:     I have personally reviewed and noted the following in the patient's chart:   Medical and social history Use of alcohol, tobacco or illicit drugs  Current medications and supplements including opioid prescriptions. Patient is not currently taking opioid prescriptions. Functional ability and status Nutritional status Physical activity Advanced directives List of other physicians Hospitalizations, surgeries, and ER visits in previous 12 months Vitals Screenings to include cognitive, depression, and falls Referrals and appointments  In addition, I have reviewed and discussed with patient certain preventive protocols, quality metrics, and best practice recommendations. A written personalized care plan for preventive services as well as general preventive health recommendations were provided to patient.     Donne Anon, CMA   03/28/2023   After Visit Summary: (MyChart) Due to this being a telephonic visit, the after visit summary with patients personalized plan was offered to patient via MyChart   Nurse Notes: None

## 2023-04-02 DIAGNOSIS — G4733 Obstructive sleep apnea (adult) (pediatric): Secondary | ICD-10-CM | POA: Diagnosis not present

## 2023-04-20 ENCOUNTER — Other Ambulatory Visit: Payer: Self-pay | Admitting: Family Medicine

## 2023-05-03 DIAGNOSIS — G4733 Obstructive sleep apnea (adult) (pediatric): Secondary | ICD-10-CM | POA: Diagnosis not present

## 2023-05-11 ENCOUNTER — Other Ambulatory Visit: Payer: Self-pay | Admitting: Family Medicine

## 2023-05-22 NOTE — Assessment & Plan Note (Addendum)
 Elevated at home and at office. Will add Metoprolol XL 25 mg po daily and reassess. Encouraged heart healthy diet such as the DASH diet and exercise as tolerated.

## 2023-05-22 NOTE — Assessment & Plan Note (Signed)
 Supplement and monitor

## 2023-05-22 NOTE — Assessment & Plan Note (Signed)
 Hydrate and monitor

## 2023-05-22 NOTE — Assessment & Plan Note (Signed)
 Using cpap

## 2023-05-22 NOTE — Assessment & Plan Note (Addendum)
 Encouraged DASH or MIND diet, decrease po intake and increase exercise as tolerated. Needs 7-8 hours of sleep nightly. Avoid trans fats, eat small, frequent meals every 4-5 hours with lean proteins, complex carbs and healthy fats. Minimize simple carbs will start course of Zepbound patient counseled on need to eat well on this medication to minimize risk of side effects

## 2023-05-22 NOTE — Assessment & Plan Note (Signed)
 [id number] acceptable, minimize simple carbs. Increase exercise as tolerated.

## 2023-05-22 NOTE — Assessment & Plan Note (Signed)
 Encourage heart healthy diet such as MIND or DASH diet, increase exercise, avoid trans fats, simple carbohydrates and processed foods, consider a krill or fish or flaxseed oil cap daily. Tolerating Atorvastatin

## 2023-05-23 ENCOUNTER — Other Ambulatory Visit: Payer: Self-pay | Admitting: Family Medicine

## 2023-05-23 DIAGNOSIS — Z1231 Encounter for screening mammogram for malignant neoplasm of breast: Secondary | ICD-10-CM

## 2023-05-26 ENCOUNTER — Encounter: Payer: Self-pay | Admitting: Family Medicine

## 2023-05-26 ENCOUNTER — Ambulatory Visit (INDEPENDENT_AMBULATORY_CARE_PROVIDER_SITE_OTHER): Payer: PPO | Admitting: Family Medicine

## 2023-05-26 VITALS — BP 136/82 | HR 74 | Temp 97.8°F | Resp 18 | Ht 62.0 in | Wt 229.4 lb

## 2023-05-26 DIAGNOSIS — I10 Essential (primary) hypertension: Secondary | ICD-10-CM | POA: Diagnosis not present

## 2023-05-26 DIAGNOSIS — E785 Hyperlipidemia, unspecified: Secondary | ICD-10-CM | POA: Diagnosis not present

## 2023-05-26 DIAGNOSIS — E669 Obesity, unspecified: Secondary | ICD-10-CM | POA: Diagnosis not present

## 2023-05-26 DIAGNOSIS — E559 Vitamin D deficiency, unspecified: Secondary | ICD-10-CM

## 2023-05-26 DIAGNOSIS — R252 Cramp and spasm: Secondary | ICD-10-CM | POA: Diagnosis not present

## 2023-05-26 DIAGNOSIS — G4730 Sleep apnea, unspecified: Secondary | ICD-10-CM

## 2023-05-26 DIAGNOSIS — R739 Hyperglycemia, unspecified: Secondary | ICD-10-CM

## 2023-05-26 MED ORDER — TIRZEPATIDE-WEIGHT MANAGEMENT 2.5 MG/0.5ML ~~LOC~~ SOLN: 2.5000 mg | SUBCUTANEOUS | 0 refills | Status: AC

## 2023-05-26 MED ORDER — METOPROLOL SUCCINATE ER 25 MG PO TB24: 25.0000 mg | ORAL_TABLET | Freq: Every day | ORAL | 1 refills | Status: DC

## 2023-05-26 NOTE — Patient Instructions (Signed)
 Zepbound  [employer]

## 2023-05-29 ENCOUNTER — Encounter: Payer: Self-pay | Admitting: Family Medicine

## 2023-05-29 NOTE — Progress Notes (Signed)
 Subjective:    Patient ID: Sherri Sullivan, female    DOB: 01/03/1961, 63 y.o.   MRN: [id number]  Chief Complaint  Patient presents with   Follow-up    HPI Discussed the use of AI scribe software for clinical note transcription with the patient, who gave verbal consent to proceed.  History of Present Illness Sherri Sullivan is a 63 year old female with hypertension who presents with elevated blood pressure readings.  She has been monitoring her blood pressure at home, with readings consistently in the range of 130-140 mmHg systolic and none in the 120s. She is currently on Triamterene HCTZ for hypertension. She notes occasional elevated pulse rates, such as 87 and 89 bpm, but no significantly low pulse rates. She experiences stress and anxiety, which she feels may be contributing to her elevated blood pressure. She describes feeling 'so scared' and 'losing my mind' due to current events and societal issues.  She is interested in discussing Mounjaro for weight management, although she does not have diabetes. She has spoken with her insurance company about coverage for this medication.  She has a history of constipation, which she manages with Benefiber and increased water intake. Her bowel movements are 'a little slower' but she still goes every day.  No recent sickness or flu. She reports experiencing symptoms related to tree pollen, including a low-grade headache and ear discomfort. She uses Zyrtec and Flonase for these symptoms.    Past Medical History:  Diagnosis Date   Anginal pain (HCC)    Arthritis    L shoulder- has had injections, degenerative changes in lumbar spine    Back pain    Chicken pox as a child   Constipation    Dizziness    Dysrhythmia 2013   SVT- treated by ablation by Dr. Ladona Ridgel to f/u with as needed basis    Encounter for preventative adult health care exam with abnormal findings 02/09/2015   HTN (hypertension)    Hypokalemia 02/09/2015   Joint pain    Leg  edema    Measles as a child   Measles as a child   Obesity    Panic attack    during episode of feeling to crowded    Preventative health care 02/09/2015   Seasonal allergies    SOB (shortness of breath) 09/23/2011   "a little bit; at rest; before ablation", SOB again now (08/2014- due to lack  of exercise)    SVT (supraventricular tachycardia) (HCC)    Uterine fibroid 12/17/2012   Cervical polyp per patient Follows with [facility], Dr Varney Baas    Past Surgical History:  Procedure Laterality Date   ABDOMINAL HYSTERECTOMY  1990's   CARDIAC ELECTROPHYSIOLOGY STUDY AND ABLATION  09/23/11   MAXIMUM ACCESS (MAS)POSTERIOR LUMBAR INTERBODY FUSION (PLIF) 1 LEVEL N/A 09/24/2014   Procedure: L5-S1 MAS PLIF ;  Surgeon: Maeola Harman, MD;  Location: [facility];  Service: Neurosurgery;  Laterality: N/A;  L5-S1 MAS PLIF fusion   SUPRAVENTRICULAR TACHYCARDIA ABLATION N/A 09/23/2011   Procedure: SUPRAVENTRICULAR TACHYCARDIA ABLATION;  Surgeon: Marinus Maw, MD;  Location: [facility] CATH LAB;  Service: Cardiovascular;  Laterality: N/A;   TUBAL LIGATION  1980's    Family History  Problem Relation Age of Onset   Diabetes Mother        type 2   Heart disease Mother    Hypertension Mother    Stroke Mother    Obesity Mother    Heart disease Father  Obesity Father    Cancer Sister        lung   Hypertension Daughter    Lupus Daughter    Diabetes Maternal Aunt    Kidney disease Maternal Aunt    Diabetes Maternal Uncle    Stroke Maternal Grandmother    Breast cancer Cousin    Colon cancer Neg Hx    Esophageal cancer Neg Hx    Rectal cancer Neg Hx    Stomach cancer Neg Hx     Social History   Socioeconomic History   Marital status: Married    Spouse name: Maisie Fus   Number of children: 2   Years of education: Not on file   Highest education level: 12th grade  Occupational History   Occupation: Retired  Tobacco Use   Smoking status: Former    Current packs/day: 0.00     Average packs/day: 0.1 packs/day for 8.0 years (1.0 ttl pk-yrs)    Types: Cigarettes    Start date: 03/29/1976    Quit date: 03/29/1984    Years since quitting: 39.1   Smokeless tobacco: Never  Vaping Use   Vaping status: Never Used  Substance and Sexual Activity   Alcohol use: Yes    Alcohol/week: 14.0 standard drinks of alcohol    Types: 14 Glasses of wine per week    Comment: Drinks 2-3 glasses wine on the weekends   Drug use: No   Sexual activity: Not Currently  Other Topics Concern   Not on file  Social History Narrative   No major restrictions   Lives with husband, daughter, grand daughter in a 2 story home.    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   Social Drivers of Corporate investment banker Strain: Low Risk  (05/25/2023)   Overall Financial Resource Strain (CARDIA)    Difficulty of Paying Living Expenses: Not hard at all  Food Insecurity: No Food Insecurity (05/25/2023)   Hunger Vital Sign    Worried About Running Out of Food in the Last Year: Never true    Ran Out of Food in the Last Year: Never true  Transportation Needs: No Transportation Needs (05/25/2023)   PRAPARE - Administrator, Civil Service (Medical): No    Lack of Transportation (Non-Medical): No  Physical Activity: Inactive (05/25/2023)   Exercise Vital Sign    Days of Exercise per Week: 0 days    Minutes of Exercise per Session: 0 min  Stress: No Stress Concern Present (05/25/2023)   [employer] of Occupational Health - Occupational Stress Questionnaire    Feeling of Stress : Only a little  Recent Concern: Stress - Stress Concern Present (03/28/2023)   [employer] of Occupational Health - Occupational Stress Questionnaire    Feeling of Stress : To some extent  Social Connections: Moderately Isolated (05/25/2023)   Social Connection and Isolation Panel [NHANES]    Frequency of Communication with Friends and Family: More than three times a week     Frequency of Social Gatherings with Friends and Family: Once a week    Attends Religious Services: Never    Database administrator or Organizations: No    Attends Banker Meetings: Never    Marital Status: Married  Catering manager Violence: Not At Risk (03/28/2023)   Humiliation, Afraid, Rape, and Kick questionnaire    Fear of Current or Ex-Partner: No    Emotionally Abused: No  Physically Abused: No    Sexually Abused: No    Outpatient Medications Prior to Visit  Medication Sig Dispense Refill   albuterol (VENTOLIN HFA) 108 (90 Base) MCG/ACT inhaler Inhale 2 puffs into the lungs every 6 (six) hours as needed for wheezing or shortness of breath. 18 g 1   ALPRAZolam (XANAX) 0.25 MG tablet Take 0.5-1 tablets (0.125-0.25 mg total) by mouth at bedtime as needed for anxiety. 30 tablet 3   ascorbic acid (VITAMIN C) 100 MG tablet      atorvastatin (LIPITOR) 10 MG tablet Take 1 tablet (10 mg total) by mouth daily. 90 tablet 1   Cholecalciferol (D 1000) 25 MCG (1000 UT) capsule      diclofenac (VOLTAREN) 75 MG EC tablet Take 1 tablet by mouth twice daily 180 tablet 0   estradiol (ESTRACE) 2 MG tablet Take 1 tablet by mouth once daily 90 tablet 0   fluticasone (FLONASE) 50 MCG/ACT nasal spray Place 2 sprays into both nostrils daily. 48 g 1   KLOR-CON M20 20 MEQ tablet Take 1 tablet by mouth twice daily 180 tablet 0   methocarbamol (ROBAXIN) 500 MG tablet Take 1 tablet (500 mg total) by mouth every 6 (six) hours as needed for muscle spasms. 90 tablet 0   Multiple Vitamins-Minerals (ZINC PO)      OMEGA-3 KRILL OIL PO Take 353 mg by mouth daily. MegaRed Joint Care     pantoprazole (PROTONIX) 40 MG tablet Take 1 tablet (40 mg total) by mouth daily. 30 tablet 3   Probiotic Product (PROBIOTIC PO) Take 1 tablet by mouth in the morning.     triamterene-hydrochlorothiazide (MAXZIDE-25) 37.5-25 MG tablet Take 1 tablet by mouth once daily 90 tablet 0   Wheat Dextrin (BENEFIBER) POWD Take  2-3 scoop by mouth daily as needed.     No facility-administered medications prior to visit.    Allergies  Allergen Reactions   Doxycycline Diarrhea and Nausea And Vomiting    ROS     Objective:    Physical Exam  BP 136/82 (BP Location: Left Arm, Patient Position: Sitting, Cuff Size: Large)   Pulse 74   Temp 97.8 F (36.6 C) (Oral)   Resp 18   Ht 5\' 2"  (1.575 m)   Wt 229 lb 6.4 oz (104.1 kg)   SpO2 96%   BMI 41.96 kg/m  Wt Readings from Last 3 Encounters:  05/26/23 229 lb 6.4 oz (104.1 kg)  02/17/23 231 lb (104.8 kg)  01/04/23 231 lb 3.2 oz (104.9 kg)    Diabetic Foot Exam - Simple   No data filed    Lab Results  Component Value Date   WBC 5.4 02/17/2023   HGB 13.4 02/17/2023   HCT 40.7 02/17/2023   PLT 240.0 02/17/2023   GLUCOSE 98 02/17/2023   CHOL 185 02/17/2023   TRIG 197.0 (H) 02/17/2023   HDL 53.60 02/17/2023   LDLDIRECT 143.0 08/29/2017   LDLCALC 92 02/17/2023   ALT 12 02/17/2023   AST 15 02/17/2023   NA 139 02/17/2023   K 4.0 02/17/2023   CL 105 02/17/2023   CREATININE 0.61 02/17/2023   BUN 14 02/17/2023   CO2 24 02/17/2023   TSH 1.38 02/17/2023   HGBA1C 5.5 02/17/2023    Lab Results  Component Value Date   TSH 1.38 02/17/2023   Lab Results  Component Value Date   WBC 5.4 02/17/2023   HGB 13.4 02/17/2023   HCT 40.7 02/17/2023   MCV 90.9 02/17/2023  PLT 240.0 02/17/2023   Lab Results  Component Value Date   NA 139 02/17/2023   K 4.0 02/17/2023   CO2 24 02/17/2023   GLUCOSE 98 02/17/2023   BUN 14 02/17/2023   CREATININE 0.61 02/17/2023   BILITOT 0.9 02/17/2023   ALKPHOS 57 02/17/2023   AST 15 02/17/2023   ALT 12 02/17/2023   PROT 6.7 02/17/2023   ALBUMIN 4.0 02/17/2023   CALCIUM 10.2 02/17/2023   ANIONGAP 9 09/13/2014   GFR 95.67 02/17/2023   Lab Results  Component Value Date   CHOL 185 02/17/2023   Lab Results  Component Value Date   HDL 53.60 02/17/2023   Lab Results  Component Value Date   LDLCALC 92  02/17/2023   Lab Results  Component Value Date   TRIG 197.0 (H) 02/17/2023   Lab Results  Component Value Date   CHOLHDL 3 02/17/2023   Lab Results  Component Value Date   HGBA1C 5.5 02/17/2023       Assessment & Plan:  Essential hypertension, benign Assessment & Plan: Elevated at home and at office. Will add Metoprolol XL 25 mg po daily and reassess. Encouraged heart healthy diet such as the DASH diet and exercise as tolerated.   Orders: -     Amb ref to Medical Nutrition Therapy-MNT  Hyperglycemia Assessment & Plan: hgba1c acceptable, minimize simple carbs. Increase exercise as tolerated.   Orders: -     Amb ref to Medical Nutrition Therapy-MNT  Hyperlipidemia, unspecified hyperlipidemia type Assessment & Plan: Encourage heart healthy diet such as MIND or DASH diet, increase exercise, avoid trans fats, simple carbohydrates and processed foods, consider a krill or fish or flaxseed oil cap daily. Tolerating Atorvastatin  Orders: -     Amb ref to Medical Nutrition Therapy-MNT  Muscle cramps Assessment & Plan: Hydrate and monitor    Obesity, unspecified class, unspecified obesity type, unspecified whether serious comorbidity present Assessment & Plan: Encouraged DASH or MIND diet, decrease po intake and increase exercise as tolerated. Needs 7-8 hours of sleep nightly. Avoid trans fats, eat small, frequent meals every 4-5 hours with lean proteins, complex carbs and healthy fats. Minimize simple carbs will start course of Zepbound patient counseled on need to eat well on this medication to minimize risk of side effects  Orders: -     Amb ref to Medical Nutrition Therapy-MNT  Severe sleep apnea Assessment & Plan: Using cpap   Vitamin D deficiency Assessment & Plan: Supplement and monitor   Other orders -     Metoprolol Succinate ER; Take 1 tablet (25 mg total) by mouth daily.  Dispense: 90 tablet; Refill: 1 -     Tirzepatide-Weight Management; Inject 2.5 mg  into the skin once a week.  Dispense: 2 mL; Refill: 0    Assessment and Plan Assessment & Plan Hypertension Home blood pressure readings consistently in the 130-140 range. Currently on Triumetri and HCTZ. Discussed the benefits of adding a beta blocker to further control blood pressure and potentially reduce stress. -Start Metoprolol 25mg  daily. -Check blood pressure and pulse in 2 weeks to assess response to therapy.  Weight Management Discussed the potential benefits of Mounjaro (Zepbound) for appetite suppression and weight loss. Patient interested in trying this medication. -Submit prior authorization request for Zepbound. -If approved, start at 2.5mg  weekly, with potential to increase dose based on tolerance and effect. -Schedule virtual visit in 3 months to assess response to therapy.  Allergic Rhinitis Tree pollen causing symptoms. Patient has Zyrtec and  Flonase on hand. -Continue Zyrtec and Flonase as needed for symptoms.  General Health Maintenance / Followup Plans -Referral to nutritionist for dietary counseling, focusing on a modified keto diet. -Physical exam scheduled for May 2025. -Plan to check labs at that time, after potential initiation of Zepbound.     Danise Edge, MD

## 2023-05-30 ENCOUNTER — Encounter: Payer: Self-pay | Admitting: Family Medicine

## 2023-05-31 DIAGNOSIS — G4733 Obstructive sleep apnea (adult) (pediatric): Secondary | ICD-10-CM | POA: Diagnosis not present

## 2023-06-05 ENCOUNTER — Other Ambulatory Visit: Payer: Self-pay | Admitting: Family Medicine

## 2023-06-07 ENCOUNTER — Other Ambulatory Visit: Payer: Self-pay | Admitting: Family Medicine

## 2023-06-17 ENCOUNTER — Other Ambulatory Visit: Payer: Self-pay | Admitting: Family Medicine

## 2023-06-18 ENCOUNTER — Telehealth: Admitting: Nurse Practitioner

## 2023-06-18 DIAGNOSIS — B9689 Other specified bacterial agents as the cause of diseases classified elsewhere: Secondary | ICD-10-CM | POA: Diagnosis not present

## 2023-06-18 DIAGNOSIS — J019 Acute sinusitis, unspecified: Secondary | ICD-10-CM | POA: Diagnosis not present

## 2023-06-18 MED ORDER — AMOXICILLIN-POT CLAVULANATE 875-125 MG PO TABS: 1.0000 | ORAL_TABLET | Freq: Two times a day (BID) | ORAL | 0 refills | Status: AC

## 2023-06-18 NOTE — Progress Notes (Signed)
 I have spent 5 minutes in review of e-visit questionnaire, review and updating patient chart, medical decision making and response to patient.   Claiborne Rigg, NP

## 2023-06-18 NOTE — Progress Notes (Signed)

## 2023-06-27 ENCOUNTER — Other Ambulatory Visit: Payer: Self-pay | Admitting: Family Medicine

## 2023-07-01 DIAGNOSIS — G4733 Obstructive sleep apnea (adult) (pediatric): Secondary | ICD-10-CM | POA: Diagnosis not present

## 2023-07-06 ENCOUNTER — Ambulatory Visit
Admission: RE | Admit: 2023-07-06 | Discharge: 2023-07-06 | Disposition: A | Discharge status: Home / Self Care | Payer: PPO | Source: Ambulatory Visit | Attending: Family Medicine | Admitting: Family Medicine

## 2023-07-06 DIAGNOSIS — Z1231 Encounter for screening mammogram for malignant neoplasm of breast: Secondary | ICD-10-CM

## 2023-07-11 ENCOUNTER — Other Ambulatory Visit: Payer: Self-pay | Admitting: Family Medicine

## 2023-07-11 ENCOUNTER — Telehealth: Payer: Self-pay

## 2023-07-11 DIAGNOSIS — N644 Mastodynia: Secondary | ICD-10-CM

## 2023-07-11 NOTE — Telephone Encounter (Signed)
 Copied from CRM [id number]. Topic: Referral - Question >> Jul 11, 2023 10:14 AM [name] wrote: Reason for CRM: Pt states she had a mammogram appointment on 04/09 but she was unable to get it done because she had a sore spot on one of her breast. The breast center on [address] and the pt are requesting a new order. The type of order they are requesting requires a provider to be present and read off the results in real time and the pt states a request has been sent over from the breast center as well Please advise, pt callback # [phone]

## 2023-07-12 NOTE — Telephone Encounter (Signed)
 Called [facility] to confirm they received signed order from provider. Notified the patient that it had ben received. Patient said she would wait for them to call her back with an appointment

## 2023-07-15 ENCOUNTER — Ambulatory Visit
Admission: RE | Admit: 2023-07-15 | Discharge: 2023-07-15 | Disposition: A | Discharge status: Home / Self Care | Source: Ambulatory Visit | Attending: Family Medicine | Admitting: Family Medicine

## 2023-07-15 DIAGNOSIS — N644 Mastodynia: Secondary | ICD-10-CM

## 2023-07-15 DIAGNOSIS — N6011 Diffuse cystic mastopathy of right breast: Secondary | ICD-10-CM | POA: Diagnosis not present

## 2023-07-31 DIAGNOSIS — G4733 Obstructive sleep apnea (adult) (pediatric): Secondary | ICD-10-CM | POA: Diagnosis not present

## 2023-08-02 ENCOUNTER — Other Ambulatory Visit: Payer: Self-pay | Admitting: Family Medicine

## 2023-08-07 ENCOUNTER — Other Ambulatory Visit: Payer: Self-pay | Admitting: Family Medicine

## 2023-08-18 ENCOUNTER — Other Ambulatory Visit: Payer: Self-pay | Admitting: Family Medicine

## 2023-08-23 NOTE — Assessment & Plan Note (Signed)
 Supplement and monitor

## 2023-08-23 NOTE — Assessment & Plan Note (Signed)
 Hydrate and monitor

## 2023-08-23 NOTE — Assessment & Plan Note (Signed)
 Elevated at home and at office. Will add Metoprolol XL 25 mg po daily and reassess. Encouraged heart healthy diet such as the DASH diet and exercise as tolerated.

## 2023-08-23 NOTE — Assessment & Plan Note (Signed)
 [id number] acceptable, minimize simple carbs. Increase exercise as tolerated.

## 2023-08-23 NOTE — Assessment & Plan Note (Signed)
Patient denies any difficulties at home. No trouble with ADLs, depression or falls. See EMR for functional status screen and depression screen. No recent changes to vision or hearing. Is UTD with immunizations. Is UTD with screening. Discussed Advanced Directives. Encouraged heart healthy diet, exercise as tolerated and adequate sleep. Passed vision and hearing screens See patient's problem list for health risk factors to monitor. See AVS for preventative healthcare recommendation schedule. Patient encouraged to maintain heart healthy diet, regular exercise, adequate sleep. Consider daily probiotics. Take medications as prescribed. Labs reviewed. Colonoscopy in 2019 repeat this year. MM 06/2022 repeat next year.

## 2023-08-23 NOTE — Assessment & Plan Note (Signed)
 Encourage heart healthy diet such as MIND or DASH diet, increase exercise, avoid trans fats, simple carbohydrates and processed foods, consider a krill or fish or flaxseed oil cap daily. Tolerating Atorvastatin

## 2023-08-25 ENCOUNTER — Ambulatory Visit (INDEPENDENT_AMBULATORY_CARE_PROVIDER_SITE_OTHER): Payer: PPO | Admitting: Family Medicine

## 2023-08-25 ENCOUNTER — Encounter: Payer: Self-pay | Admitting: Family Medicine

## 2023-08-25 VITALS — BP 136/86 | HR 75 | Ht 62.0 in | Wt 227.2 lb

## 2023-08-25 DIAGNOSIS — R739 Hyperglycemia, unspecified: Secondary | ICD-10-CM

## 2023-08-25 DIAGNOSIS — R252 Cramp and spasm: Secondary | ICD-10-CM

## 2023-08-25 DIAGNOSIS — M25521 Pain in right elbow: Secondary | ICD-10-CM | POA: Diagnosis not present

## 2023-08-25 DIAGNOSIS — E785 Hyperlipidemia, unspecified: Secondary | ICD-10-CM | POA: Diagnosis not present

## 2023-08-25 DIAGNOSIS — I10 Essential (primary) hypertension: Secondary | ICD-10-CM | POA: Diagnosis not present

## 2023-08-25 DIAGNOSIS — G4733 Obstructive sleep apnea (adult) (pediatric): Secondary | ICD-10-CM | POA: Diagnosis not present

## 2023-08-25 DIAGNOSIS — E559 Vitamin D deficiency, unspecified: Secondary | ICD-10-CM

## 2023-08-25 DIAGNOSIS — Z0000 Encounter for general adult medical examination without abnormal findings: Secondary | ICD-10-CM | POA: Diagnosis not present

## 2023-08-25 NOTE — Patient Instructions (Addendum)
 Benefiber daily x 2 weeks then increase to twice daily and drink 60-80 ounces. Kegels twice a day  Preventive Care 60-63 Years Old, Female Preventive care refers to lifestyle choices and visits with your health care provider that can promote health and wellness. Preventive care visits are also called wellness exams. What can I expect for my preventive care visit? Counseling Your health care provider may ask you questions about your: Medical history, including: Past medical problems. Family medical history. Pregnancy history. Current health, including: Menstrual cycle. Method of birth control. Emotional well-being. Home life and relationship well-being. Sexual activity and sexual health. Lifestyle, including: Alcohol, nicotine or tobacco, and drug use. Access to firearms. Diet, exercise, and sleep habits. Work and work Astronomer. Sunscreen use. Safety issues such as seatbelt and bike helmet use. Physical exam Your health care provider will check your: Height and weight. These may be used to calculate your BMI (body mass index). BMI is a measurement that tells if you are at a healthy weight. Waist circumference. This measures the distance around your waistline. This measurement also tells if you are at a healthy weight and may help predict your risk of certain diseases, such as type 2 diabetes and high blood pressure. Heart rate and blood pressure. Body temperature. Skin for abnormal spots. What immunizations do I need?  Vaccines are usually given at various ages, according to a schedule. Your health care provider will recommend vaccines for you based on your age, medical history, and lifestyle or other factors, such as travel or where you work. What tests do I need? Screening Your health care provider may recommend screening tests for certain conditions. This may include: Lipid and cholesterol levels. Diabetes screening. This is done by checking your blood sugar (glucose)  after you have not eaten for a while (fasting). Pelvic exam and Pap test. Hepatitis B test. Hepatitis C test. HIV (human immunodeficiency virus) test. STI (sexually transmitted infection) testing, if you are at risk. Lung cancer screening. Colorectal cancer screening. Mammogram. Talk with your health care provider about when you should start having regular mammograms. This may depend on whether you have a family history of breast cancer. BRCA-related cancer screening. This may be done if you have a family history of breast, ovarian, tubal, or peritoneal cancers. Bone density scan. This is done to screen for osteoporosis. Talk with your health care provider about your test results, treatment options, and if necessary, the need for more tests. Follow these instructions at home: Eating and drinking  Eat a diet that includes fresh fruits and vegetables, whole grains, lean protein, and low-fat dairy products. Take vitamin and mineral supplements as recommended by your health care provider. Do not drink alcohol if: Your health care provider tells you not to drink. You are pregnant, may be pregnant, or are planning to become pregnant. If you drink alcohol: Limit how much you have to 0-1 drink a day. Know how much alcohol is in your drink. In the [place] one drink equals one 12 oz bottle of beer (355 mL), one 5 oz glass of wine (148 mL), or one 1 oz glass of hard liquor (44 mL). Lifestyle Brush your teeth every morning and night with fluoride toothpaste. Floss one time each day. Exercise for at least 30 minutes 5 or more days each week. Do not use any products that contain nicotine or tobacco. These products include cigarettes, chewing tobacco, and vaping devices, such as e-cigarettes. If you need help quitting, ask your health care provider. Do not  use drugs. If you are sexually active, practice safe sex. Use a condom or other form of protection to prevent STIs. If you do not wish to become  pregnant, use a form of birth control. If you plan to become pregnant, see your health care provider for a prepregnancy visit. Take aspirin only as told by your health care provider. Make sure that you understand how much to take and what form to take. Work with your health care provider to find out whether it is safe and beneficial for you to take aspirin daily. Find healthy ways to manage stress, such as: Meditation, yoga, or listening to music. Journaling. Talking to a trusted person. Spending time with friends and family. Minimize exposure to UV radiation to reduce your risk of skin cancer. Safety Always wear your seat belt while driving or riding in a vehicle. Do not drive: If you have been drinking alcohol. Do not ride with someone who has been drinking. When you are tired or distracted. While texting. If you have been using any mind-altering substances or drugs. Wear a helmet and other protective equipment during sports activities. If you have firearms in your house, make sure you follow all gun safety procedures. Seek help if you have been physically or sexually abused. What's next? Visit your health care provider once a year for an annual wellness visit. Ask your health care provider how often you should have your eyes and teeth checked. Stay up to date on all vaccines. This information is not intended to replace advice given to you by your health care provider. Make sure you discuss any questions you have with your health care provider. Document Revised: 09/10/2020 Document Reviewed: 09/10/2020 Elsevier Patient Education  2024 [employer].

## 2023-08-25 NOTE — Progress Notes (Signed)
 "  Subjective:    Patient ID: Sherri Sullivan, female    DOB: 1960-06-24, 63 y.o.   MRN: [id number]  Chief Complaint  Patient presents with   Annual Exam    Patient presents today for a physical exam.    HPI Discussed the use of AI scribe software for clinical note transcription with the patient, who gave verbal consent to proceed.  History of Present Illness Sherri Sullivan is a 63 year old female who presents with respiratory symptoms and gastrointestinal discomfort.  She has been experiencing respiratory symptoms that began with a sore throat and progressed to congestion and a persistent cough. Initially, she managed her symptoms with home remedies such as gargling with warm salt water. The cough has been significant, causing discomfort under her ribs. Initially, her sputum was thick and green but has since changed color. No fever is present, but the symptoms have persisted for several weeks.  She reports gastrointestinal discomfort, describing a sensation of nausea that began around Tuesday, Aug 23, 2023. She has not vomited but feels a 'queasy, sour stomach.' She has been managing this with ginger tea and ginger capsules. No significant postnasal drip contributing to the nausea.  She has been experiencing constipation since a change in her blood pressure medication. Bowel movements are infrequent, occurring every few days, with stools that are dark, hard, and small in quantity. She has tried an over-the-counter laxative, which provided temporary relief. She takes a daily probiotic and fenofiber but admits to inconsistent use of the latter.  She notes some swelling in her ankles, which she attributes to a change in her blood pressure medication regimen. She was previously on a medication that included a diuretic. She is currently taking metoprolol  for blood pressure management and is also taking vitamin D  and a multivitamin.  She experiences right elbow pain, which she describes as persistent  and sore, possibly related to repetitive activities. The pain has been present for about three months and affects her daily activities, such as opening bottles or squeezing towels.  She experiences urinary incontinence, particularly when sneezing, coughing, or delaying urination.    Past Medical History:  Diagnosis Date   Anginal pain (HCC)    Arthritis    L shoulder- has had injections, degenerative changes in lumbar spine    Back pain    Chicken pox as a child   Constipation    Dizziness    Dysrhythmia 2013   SVT- treated by ablation by Dr. Waddell to f/u with as needed basis    Encounter for preventative adult health care exam with abnormal findings 02/09/2015   HTN (hypertension)    Hypokalemia 02/09/2015   Joint pain    Leg edema    Measles as a child   Measles as a child   Obesity    Panic attack    during episode of feeling to crowded    Preventative health care 02/09/2015   Seasonal allergies    SOB (shortness of breath) 09/23/2011   a little bit; at rest; before ablation, SOB again now (08/2014- due to lack  of exercise)    SVT (supraventricular tachycardia) (HCC)    Uterine fibroid 12/17/2012   Cervical polyp per patient Follows with [facility], Dr Tanda Mulch    Past Surgical History:  Procedure Laterality Date   ABDOMINAL HYSTERECTOMY  1990's   CARDIAC ELECTROPHYSIOLOGY STUDY AND ABLATION  09/23/11   MAXIMUM ACCESS (MAS)POSTERIOR LUMBAR INTERBODY FUSION (PLIF) 1 LEVEL N/A 09/24/2014  Procedure: L5-S1 MAS PLIF ;  Surgeon: Fairy Levels, MD;  Location: [facility];  Service: Neurosurgery;  Laterality: N/A;  L5-S1 MAS PLIF fusion   SUPRAVENTRICULAR TACHYCARDIA ABLATION N/A 09/23/2011   Procedure: SUPRAVENTRICULAR TACHYCARDIA ABLATION;  Surgeon: Danelle LELON Birmingham, MD;  Location: [facility] CATH LAB;  Service: Cardiovascular;  Laterality: N/A;   TUBAL LIGATION  1980's    Family History  Problem Relation Age of Onset   Diabetes Mother        type 2   Heart  disease Mother    Hypertension Mother    Stroke Mother    Obesity Mother    Heart disease Father    Obesity Father    Cancer Sister        lung   Hypertension Daughter    Lupus Daughter    Diabetes Maternal Aunt    Kidney disease Maternal Aunt    Diabetes Maternal Uncle    Stroke Maternal Grandmother    Breast cancer Cousin    Colon cancer Neg Hx    Esophageal cancer Neg Hx    Rectal cancer Neg Hx    Stomach cancer Neg Hx     Social History   Socioeconomic History   Marital status: Married    Spouse name: Debby   Number of children: 2   Years of education: Not on file   Highest education level: 12th grade  Occupational History   Occupation: Retired  Tobacco Use   Smoking status: Former    Current packs/day: 0.00    Average packs/day: 0.1 packs/day for 8.0 years (1.0 ttl pk-yrs)    Types: Cigarettes    Start date: 03/29/1976    Quit date: 03/29/1984    Years since quitting: 39.4   Smokeless tobacco: Never  Vaping Use   Vaping status: Never Used  Substance and Sexual Activity   Alcohol use: Yes    Alcohol/week: 14.0 standard drinks of alcohol    Types: 14 Glasses of wine per week    Comment: Drinks 2-3 glasses wine on the weekends   Drug use: No   Sexual activity: Not Currently  Other Topics Concern   Not on file  Social History Narrative   No major restrictions   Lives with husband, daughter, grand daughter in a 2 story home.    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   Social Drivers of Corporate Investment Banker Strain: Low Risk  (05/25/2023)   Overall Financial Resource Strain (CARDIA)    Difficulty of Paying Living Expenses: Not hard at all  Food Insecurity: No Food Insecurity (05/25/2023)   Hunger Vital Sign    Worried About Running Out of Food in the Last Year: Never true    Ran Out of Food in the Last Year: Never true  Transportation Needs: No Transportation Needs (05/25/2023)   PRAPARE - Scientist, Research (physical Sciences) (Medical): No    Lack of Transportation (Non-Medical): No  Physical Activity: Inactive (05/25/2023)   Exercise Vital Sign    Days of Exercise per Week: 0 days    Minutes of Exercise per Session: 0 min  Stress: No Stress Concern Present (05/25/2023)   [employer] of Occupational Health - Occupational Stress Questionnaire    Feeling of Stress : Only a little  Recent Concern: Stress - Stress Concern Present (03/28/2023)   [employer] of Occupational Health - Occupational Stress Questionnaire    Feeling of  Stress : To some extent  Social Connections: Moderately Isolated (05/25/2023)   Social Connection and Isolation Panel [NHANES]    Frequency of Communication with Friends and Family: More than three times a week    Frequency of Social Gatherings with Friends and Family: Once a week    Attends Religious Services: Never    Database Administrator or Organizations: No    Attends Banker Meetings: Never    Marital Status: Married  Catering Manager Violence: Not At Risk (03/28/2023)   Humiliation, Afraid, Rape, and Kick questionnaire    Fear of Current or Ex-Partner: No    Emotionally Abused: No    Physically Abused: No    Sexually Abused: No    Outpatient Medications Prior to Visit  Medication Sig Dispense Refill   albuterol  (VENTOLIN  HFA) 108 (90 Base) MCG/ACT inhaler INHALE 2 PUFFS BY MOUTH EVERY 6 HOURS AS NEEDED FOR WHEEZING FOR SHORTNESS OF BREATH 18 g 0   ALPRAZolam  (XANAX ) 0.25 MG tablet Take 0.5-1 tablets (0.125-0.25 mg total) by mouth at bedtime as needed for anxiety. 30 tablet 3   ascorbic acid (VITAMIN C) 100 MG tablet      atorvastatin  (LIPITOR) 10 MG tablet Take 1 tablet by mouth once daily 90 tablet 0   Cholecalciferol (D 1000) 25 MCG (1000 UT) capsule      diclofenac  (VOLTAREN ) 75 MG EC tablet Take 1 tablet by mouth twice daily 180 tablet 0   estradiol  (ESTRACE ) 2 MG tablet Take 1 tablet by mouth once daily (Patient taking differently:  Take 1 mg by mouth daily.) 90 tablet 0   fluticasone  (FLONASE ) 50 MCG/ACT nasal spray Place 2 sprays into both nostrils daily. 48 g 1   KLOR-CON  M20 20 MEQ tablet Take 1 tablet by mouth twice daily 180 tablet 0   methocarbamol  (ROBAXIN ) 500 MG tablet Take 1 tablet (500 mg total) by mouth every 6 (six) hours as needed for muscle spasms. 90 tablet 0   metoprolol  succinate (TOPROL -XL) 25 MG 24 hr tablet Take 1 tablet (25 mg total) by mouth daily. 90 tablet 1   Multiple Vitamins-Minerals (ZINC PO)      OMEGA-3 KRILL OIL PO Take 353 mg by mouth daily. MegaRed Joint Care     pantoprazole  (PROTONIX ) 40 MG tablet Take 1 tablet (40 mg total) by mouth daily. 30 tablet 3   Probiotic Product (PROBIOTIC PO) Take 1 tablet by mouth in the morning.     triamterene -hydrochlorothiazide (MAXZIDE-25) 37.5-25 MG tablet Take 1 tablet by mouth once daily 90 tablet 1   Wheat Dextrin (BENEFIBER) POWD Take 2-3 scoop by mouth daily as needed.     No facility-administered medications prior to visit.    Allergies  Allergen Reactions   Doxycycline  Diarrhea and Nausea And Vomiting    Review of Systems  Constitutional:  Negative for chills, fever and malaise/fatigue.  HENT:  Positive for congestion and sore throat. Negative for hearing loss.   Eyes:  Negative for discharge.  Respiratory:  Positive for cough and sputum production. Negative for shortness of breath.   Cardiovascular:  Negative for chest pain, palpitations and leg swelling.  Gastrointestinal:  Positive for constipation. Negative for abdominal pain, blood in stool, diarrhea, heartburn, nausea and vomiting.  Genitourinary:  Negative for dysuria, frequency, hematuria and urgency.  Musculoskeletal:  Positive for back pain, joint pain and myalgias. Negative for falls.  Skin:  Negative for rash.  Neurological:  Negative for dizziness, sensory change, loss of consciousness, weakness and  headaches.  Endo/Heme/Allergies:  Negative for environmental allergies.  Does not bruise/bleed easily.  Psychiatric/Behavioral:  Negative for depression and suicidal ideas. The patient is not nervous/anxious and does not have insomnia.        Objective:     Physical Exam Constitutional:      General: She is not in acute distress.    Appearance: Normal appearance. She is not diaphoretic.  HENT:     Head: Normocephalic and atraumatic.     Right Ear: Tympanic membrane, ear canal and external ear normal.     Left Ear: Tympanic membrane, ear canal and external ear normal.     Nose: Nose normal.     Mouth/Throat:     Mouth: Mucous membranes are moist.     Pharynx: Oropharynx is clear. No oropharyngeal exudate.  Eyes:     General: No scleral icterus.       Right eye: No discharge.        Left eye: No discharge.     Conjunctiva/sclera: Conjunctivae normal.     Pupils: Pupils are equal, round, and reactive to light.  Neck:     Thyroid : No thyromegaly.  Cardiovascular:     Rate and Rhythm: Normal rate and regular rhythm.     Heart sounds: Normal heart sounds. No murmur heard. Pulmonary:     Effort: Pulmonary effort is normal. No respiratory distress.     Breath sounds: Normal breath sounds. No wheezing or rales.  Abdominal:     General: Bowel sounds are normal. There is no distension.     Palpations: Abdomen is soft. There is no mass.     Tenderness: There is no abdominal tenderness.  Musculoskeletal:        General: No tenderness. Normal range of motion.     Cervical back: Normal range of motion and neck supple.  Lymphadenopathy:     Cervical: No cervical adenopathy.  Skin:    General: Skin is warm and dry.     Findings: No rash.  Neurological:     General: No focal deficit present.     Mental Status: She is alert and oriented to person, place, and time.     Cranial Nerves: No cranial nerve deficit.     Coordination: Coordination normal.     Deep Tendon Reflexes: Reflexes are normal and symmetric. Reflexes normal.  Psychiatric:        Mood and  Affect: Mood normal.        Behavior: Behavior normal.        Thought Content: Thought content normal.        Judgment: Judgment normal.     BP 136/86   Pulse 75   Ht 5' 2 (1.575 m)   Wt 227 lb 3.2 oz (103.1 kg)   SpO2 98%   BMI 41.56 kg/m  Wt Readings from Last 3 Encounters:  08/25/23 227 lb 3.2 oz (103.1 kg)  05/26/23 229 lb 6.4 oz (104.1 kg)  02/17/23 231 lb (104.8 kg)    Diabetic Foot Exam - Simple   No data filed    Lab Results  Component Value Date   WBC 5.4 02/17/2023   HGB 13.4 02/17/2023   HCT 40.7 02/17/2023   PLT 240.0 02/17/2023   GLUCOSE 98 02/17/2023   CHOL 185 02/17/2023   TRIG 197.0 (H) 02/17/2023   HDL 53.60 02/17/2023   LDLDIRECT 143.0 08/29/2017   LDLCALC 92 02/17/2023   ALT 12 02/17/2023   AST 15 02/17/2023   NA  139 02/17/2023   K 4.0 02/17/2023   CL 105 02/17/2023   CREATININE 0.61 02/17/2023   BUN 14 02/17/2023   CO2 24 02/17/2023   TSH 1.38 02/17/2023   HGBA1C 5.5 02/17/2023    Lab Results  Component Value Date   TSH 1.38 02/17/2023   Lab Results  Component Value Date   WBC 5.4 02/17/2023   HGB 13.4 02/17/2023   HCT 40.7 02/17/2023   MCV 90.9 02/17/2023   PLT 240.0 02/17/2023   Lab Results  Component Value Date   NA 139 02/17/2023   K 4.0 02/17/2023   CO2 24 02/17/2023   GLUCOSE 98 02/17/2023   BUN 14 02/17/2023   CREATININE 0.61 02/17/2023   BILITOT 0.9 02/17/2023   ALKPHOS 57 02/17/2023   AST 15 02/17/2023   ALT 12 02/17/2023   PROT 6.7 02/17/2023   ALBUMIN 4.0 02/17/2023   CALCIUM  10.2 02/17/2023   ANIONGAP 9 09/13/2014   GFR 95.67 02/17/2023   Lab Results  Component Value Date   CHOL 185 02/17/2023   Lab Results  Component Value Date   HDL 53.60 02/17/2023   Lab Results  Component Value Date   LDLCALC 92 02/17/2023   Lab Results  Component Value Date   TRIG 197.0 (H) 02/17/2023   Lab Results  Component Value Date   CHOLHDL 3 02/17/2023   Lab Results  Component Value Date   HGBA1C 5.5  02/17/2023       Assessment & Plan:  Hyperglycemia Assessment & Plan: hgba1c acceptable, minimize simple carbs. Increase exercise as tolerated.   Orders: -     Hemoglobin A1c  Essential hypertension, benign Assessment & Plan: Elevated at home and at office. Will add Metoprolol  XL 25 mg po daily and reassess. Encouraged heart healthy diet such as the DASH diet and exercise as tolerated.   Orders: -     Comprehensive metabolic panel with GFR -     CBC with Differential/Platelet  Hyperlipidemia, unspecified hyperlipidemia type Assessment & Plan: Encourage heart healthy diet such as MIND or DASH diet, increase exercise, avoid trans fats, simple carbohydrates and processed foods, consider a krill or fish or flaxseed oil cap daily. Tolerating Atorvastatin   Orders: -     Lipid panel -     TSH  Muscle cramps Assessment & Plan: Hydrate and monitor   Orders: -     Magnesium  Vitamin D  deficiency Assessment & Plan: Supplement and monitor  Orders: -     VITAMIN D  25 Hydroxy (Vit-D Deficiency, Fractures)  Preventative health care Assessment & Plan: Patient denies any difficulties at home. No trouble with ADLs, depression or falls. See EMR for functional status screen and depression screen. No recent changes to vision or hearing. Is UTD with immunizations. Is UTD with screening. Discussed Advanced Directives. Encouraged heart healthy diet, exercise as tolerated and adequate sleep. Passed vision and hearing screens See patient's problem list for health risk factors to monitor. See AVS for preventative healthcare recommendation schedule. Patient encouraged to maintain heart healthy diet, regular exercise, adequate sleep. Consider daily probiotics. Take medications as prescribed. Labs reviewed. Colonoscopy in 2019 repeat in 2029. MM 06/2023 repeat next year. Dexa 12/24 Sees GYN for paps, see them again in August, Dr Thomblin. Last 12/2020     Right elbow pain -     Ambulatory  referral to Sports Medicine    Assessment and Plan Assessment & Plan Essential hypertension Blood pressure high normal. Previous medication change may have caused constipation and  ankle swelling. Triamterene -HCTZ discontinuation may have affected blood pressure control and swelling. - Continue metoprolol  and resume triamterene -HCTZ. - Monitor blood pressure at home, target 120s/70s or low 130s/80s. - Consider switching to carvedilol if constipation persists, noting twice daily dosing. - Repeat lab work today.  Constipation Constipation possibly related to recent blood pressure medication change. Symptoms include dark, hard stools and infrequent bowel movements. - Increase Benefiber to daily for two weeks, then twice daily. - Ensure hydration, 60-80 ounces fluid daily. - Continue probiotic use. - Monitor bowel movements, report if no improvement.  Right elbow pain (epicondylitis) Pain consistent with epicondylitis, likely due to repetitive movements, affecting daily activities. - Refer to sports medicine for evaluation and possible ultrasound. - Consider steroid injection or physical therapy based on evaluation.  Vitamin D  deficiency Previous vitamin D  level low normal at 33.8. Currently taking 2000 IU vitamin D  and multivitamin. - Continue vitamin D  supplementation and multivitamin. - Check current vitamin D  level with today's lab work.  General Health Maintenance Routine screenings and vaccinations up to date. Pap smear due in August. - Schedule Pap smear in August. - Continue routine health maintenance and screenings as scheduled.      Harlene Horton, MD "

## 2023-08-26 ENCOUNTER — Ambulatory Visit: Payer: Self-pay | Admitting: Family Medicine

## 2023-08-26 LAB — COMPREHENSIVE METABOLIC PANEL WITH GFR
ALT: 13 U/L (ref 0–35)
AST: 15 U/L (ref 0–37)
Albumin: 4.2 g/dL (ref 3.5–5.2)
Alkaline Phosphatase: 62 U/L (ref 39–117)
BUN: 11 mg/dL (ref 6–23)
CO2: 26 meq/L (ref 19–32)
Calcium: 10.2 mg/dL (ref 8.4–10.5)
Chloride: 108 meq/L (ref 96–112)
Creatinine, Ser: 0.68 mg/dL (ref 0.40–1.20)
GFR: 92.85 mL/min (ref >60.00)
Glucose, Bld: 88 mg/dL (ref 70–99)
Potassium: 4.3 meq/L (ref 3.5–5.1)
Sodium: 142 meq/L (ref 135–145)
Total Bilirubin: 1 mg/dL (ref 0.2–1.2)
Total Protein: 7 g/dL (ref 6.0–8.3)

## 2023-08-26 LAB — HEMOGLOBIN A1C: Hgb A1c MFr Bld: 5.4 % (ref 4.6–6.5)

## 2023-08-26 LAB — CBC WITH DIFFERENTIAL/PLATELET
Basophils Absolute: 0.1 10*3/uL (ref 0.0–0.1)
Basophils Relative: 1 % (ref 0.0–3.0)
Eosinophils Absolute: 0.2 10*3/uL (ref 0.0–0.7)
Eosinophils Relative: 3.7 % (ref 0.0–5.0)
HCT: 40.9 % (ref 36.0–46.0)
Hemoglobin: 13.5 g/dL (ref 12.0–15.0)
Lymphocytes Relative: 49.8 % — ABNORMAL HIGH (ref 12.0–46.0)
Lymphs Abs: 2.7 10*3/uL (ref 0.7–4.0)
MCHC: 32.9 g/dL (ref 30.0–36.0)
MCV: 88 fl (ref 78.0–100.0)
Monocytes Absolute: 0.5 10*3/uL (ref 0.1–1.0)
Monocytes Relative: 9.6 % (ref 3.0–12.0)
Neutro Abs: 1.9 10*3/uL (ref 1.4–7.7)
Neutrophils Relative %: 35.9 % — ABNORMAL LOW (ref 43.0–77.0)
Platelets: 250 10*3/uL (ref 150.0–400.0)
RBC: 4.65 Mil/uL (ref 3.87–5.11)
RDW: 15.9 % — ABNORMAL HIGH (ref 11.5–15.5)
WBC: 5.4 10*3/uL (ref 4.0–10.5)

## 2023-08-26 LAB — LIPID PANEL
Cholesterol: 178 mg/dL (ref 0–200)
HDL: 54.3 mg/dL (ref >39.00)
LDL Cholesterol: 83 mg/dL (ref 0–99)
NonHDL: 123.72
Total CHOL/HDL Ratio: 3
Triglycerides: 206 mg/dL — ABNORMAL HIGH (ref 0.0–149.0)
VLDL: 41.2 mg/dL — ABNORMAL HIGH (ref 0.0–40.0)

## 2023-08-26 LAB — TSH: TSH: 0.77 u[IU]/mL (ref 0.35–5.50)

## 2023-08-26 LAB — VITAMIN D 25 HYDROXY (VIT D DEFICIENCY, FRACTURES): VITD: 28.42 ng/mL — ABNORMAL LOW (ref 30.00–100.00)

## 2023-08-26 LAB — MAGNESIUM: Magnesium: 2 mg/dL (ref 1.5–2.5)

## 2023-08-30 ENCOUNTER — Other Ambulatory Visit: Payer: Self-pay

## 2023-08-30 ENCOUNTER — Encounter: Payer: Self-pay | Admitting: Family Medicine

## 2023-08-30 ENCOUNTER — Ambulatory Visit (INDEPENDENT_AMBULATORY_CARE_PROVIDER_SITE_OTHER)

## 2023-08-30 ENCOUNTER — Ambulatory Visit: Admitting: Family Medicine

## 2023-08-30 VITALS — BP 122/82 | HR 76 | Ht 62.0 in | Wt 225.0 lb

## 2023-08-30 DIAGNOSIS — M1611 Unilateral primary osteoarthritis, right hip: Secondary | ICD-10-CM | POA: Diagnosis not present

## 2023-08-30 DIAGNOSIS — M25551 Pain in right hip: Secondary | ICD-10-CM

## 2023-08-30 DIAGNOSIS — M25521 Pain in right elbow: Secondary | ICD-10-CM | POA: Diagnosis not present

## 2023-08-30 NOTE — Patient Instructions (Addendum)
 Thank you for coming in today.   Please get an Xray today before you leave   I've referred you to Physical Therapy.  Let us  know if you don't hear from them in one week.   Recheck at the end of July.   Tennis elbow brace

## 2023-08-30 NOTE — Progress Notes (Signed)
 I, Miquel Amen, CMA acting as a scribe for Garlan Juniper, MD.  Sherri Sullivan is a 63 y.o. female who presents to [facility] at [facility] today for R elbow pain ongoing for about 3+ months. Pt locates pain to lateral aspect of the elbow. Sx are constant, occasional difficulty with gripping on worse days. Pt is RHD. Short-term relief with Asper Cream and Tylenol . Does not currently work, does not play sports but does play computer games.   Radiates: localized Paresthesia: denies Grip strength: decreased Aggravates: extension Treatments tried: Asper Cream   Additionally she has pain located predominantly at the right lateral hip.  She has some anterior hip and anterior thigh pain as well.  She notes pain with hip flexion standing walking and laying in bed.  She denies pain radiating down her leg past her knee.  No weakness or numbness distally.  Pertinent review of systems: No fevers or chills  Relevant historical information: Hypertension.  Spondylolisthesis L5-S1   Exam:  BP 122/82   Pulse 76   Ht 5\' 2"  (1.575 m)   Wt 225 lb (102.1 kg)   SpO2 95%   BMI 41.15 kg/m  General: Well Developed, well nourished, and in no acute distress.   MSK: Right elbow: Normal appearing. Tender palpation at lateral epicondyle. Normal elbow motion. Intact strength. Pain with resisted wrist and finger extension and grip.  Right hip: Normal-appearing Tender palpation at lateral hip at greater trochanter. Normal hip motion. Pain and mild weakness with resisted hip flexion and abduction.    Lab and Radiology Results  Diagnostic Limited MSK Ultrasound of: Right lateral elbow Intact common extensor tendon origin at lateral epicondyle.  Small avulsion fleck present at superficial portion of lateral epicondyle. Increased vascular activity on Doppler consistent with neovascularity. No visible tears present in the tendon. Impression: Lateral epicondylitis  X-ray images right  hip obtained today personally and independently interpreted. No severe hip arthritis.   Await formal radiology review   Assessment and Plan: 63 y.o. female with right lateral elbow pain due to lateral epicondylitis.  Right lateral hip pain predominantly due to hip abductor tendinitis.  She may have a component of hip flexor tendinitis as well.  Plan for trial of physical therapy and home exercise program. Recheck in about 6 or 7 weeks.  PDMP not reviewed this encounter. Orders Placed This Encounter  Procedures   US  LIMITED JOINT SPACE STRUCTURES UP RIGHT(NO LINKED CHARGES)    Reason for Exam (SYMPTOM  OR DIAGNOSIS REQUIRED):   right elbow pain    Preferred imaging location?:   [facility]   DG HIP UNILAT WITH PELVIS 2-3 VIEWS RIGHT    Standing Status:   Future    Number of Occurrences:   1    Expiration Date:   08/29/2024    Reason for Exam (SYMPTOM  OR DIAGNOSIS REQUIRED):   eval hip pain    Preferred imaging location?:   [place] [facility]   Ambulatory referral to Physical Therapy    Referral Priority:   Routine    Referral Type:   Physical Medicine    Referral Reason:   Specialty Services Required    Requested Specialty:   Physical Therapy    Number of Visits Requested:   1   No orders of the defined types were placed in this encounter.    Discussed warning signs or symptoms. Please see discharge instructions. Patient expresses understanding.   The above documentation has been reviewed  and is accurate and complete Garlan Juniper, M.D.

## 2023-08-31 ENCOUNTER — Other Ambulatory Visit: Payer: Self-pay | Admitting: Family Medicine

## 2023-08-31 DIAGNOSIS — G4730 Sleep apnea, unspecified: Secondary | ICD-10-CM

## 2023-09-01 ENCOUNTER — Other Ambulatory Visit: Payer: Self-pay | Admitting: Family Medicine

## 2023-09-02 NOTE — Telephone Encounter (Signed)
 Requesting: alprazolm 0.25mg   Contract: None UDS: None Last Visit: 08/25/23 Next Visit: 12/27/23 w/ [facility] Last Refill: 02/17/23 #30 and 3RF   Please Advise

## 2023-09-06 ENCOUNTER — Ambulatory Visit: Payer: Self-pay | Admitting: Family Medicine

## 2023-09-06 NOTE — Progress Notes (Signed)
 Right hip x-ray shows medium to severe hip arthritis.

## 2023-09-07 DIAGNOSIS — G4733 Obstructive sleep apnea (adult) (pediatric): Secondary | ICD-10-CM | POA: Diagnosis not present

## 2023-09-11 ENCOUNTER — Other Ambulatory Visit: Payer: Self-pay | Admitting: Family Medicine

## 2023-09-15 NOTE — Therapy (Signed)
 OUTPATIENT PHYSICAL THERAPY EVALUATION   Patient Name: Sherri Sullivan MRN: [id number] DOB:1960/07/01, 63 y.o., female Today's Date: 09/15/2023  END OF SESSION:   Past Medical History:  Diagnosis Date   Anginal pain (HCC)    Arthritis    L shoulder- has had injections, degenerative changes in lumbar spine    Back pain    Chicken pox as a child   Constipation    Dizziness    Dysrhythmia 2013   SVT- treated by ablation by Dr. Carolynne Citron to f/u with as needed basis    Encounter for preventative adult health care exam with abnormal findings 02/09/2015   HTN (hypertension)    Hypokalemia 02/09/2015   Joint pain    Leg edema    Measles as a child   Measles as a child   Obesity    Panic attack    during episode of feeling to crowded    Preventative health care 02/09/2015   Seasonal allergies    SOB (shortness of breath) 09/23/2011   a little bit; at rest; before ablation, SOB again now (08/2014- due to lack  of exercise)    SVT (supraventricular tachycardia) (HCC)    Uterine fibroid 12/17/2012   Cervical polyp per patient Follows with [facility], Dr Sabino Crafts   Past Surgical History:  Procedure Laterality Date   ABDOMINAL HYSTERECTOMY  1990's   CARDIAC ELECTROPHYSIOLOGY STUDY AND ABLATION  09/23/11   MAXIMUM ACCESS (MAS)POSTERIOR LUMBAR INTERBODY FUSION (PLIF) 1 LEVEL N/A 09/24/2014   Procedure: L5-S1 MAS PLIF ;  Surgeon: Manya Sells, MD;  Location: [facility];  Service: Neurosurgery;  Laterality: N/A;  L5-S1 MAS PLIF fusion   SUPRAVENTRICULAR TACHYCARDIA ABLATION N/A 09/23/2011   Procedure: SUPRAVENTRICULAR TACHYCARDIA ABLATION;  Surgeon: Tammie Fall, MD;  Location: [facility] CATH LAB;  Service: Cardiovascular;  Laterality: N/A;   TUBAL LIGATION  1980's   Patient Active Problem List   Diagnosis Date Noted   Severe sleep apnea 11/15/2022   Carpal tunnel syndrome 11/01/2022   Numbness of hand 11/01/2022   Cervical spondylosis 11/01/2022   Cervical radiculopathy  11/01/2022   Needs sleep apnea assessment 08/16/2022   H/O colonoscopy with polypectomy 08/16/2022   History of COVID-19 12/06/2021   Dyspnea 11/27/2020   DDD (degenerative disc disease), cervical 11/27/2020   Atypical chest pain 01/18/2019   Acute left-sided thoracic back pain 01/18/2019   Hyperglycemia 01/09/2018   Colon polyp 08/29/2017   Pain in right knee 06/28/2017   Pain in left knee 06/28/2017   Vitamin D  deficiency 02/25/2017   Polyneuropathy 01/21/2016   Hypokalemia 02/09/2015   Encounter for preventative adult health care exam with abnormal findings 02/09/2015   Preventative health care 02/09/2015   Spondylolisthesis of lumbar region 09/24/2014   Spinal stenosis 05/22/2014   Spondylolisthesis at L5-S1 level 05/22/2014   Lumbar radiculopathy 05/22/2014   Neck pain 04/08/2014   Muscle cramps 01/13/2014   Tendinopathy of rotator cuff 11/28/2013   Essential hypertension, benign 11/28/2013   Urinary frequency 08/17/2013   Uterine fibroid 12/17/2012   Obesity    SVT (supraventricular tachycardia) (HCC) 09/21/2011   LOW BACK PAIN 01/04/2008   Hyperlipidemia 02/28/2007   Allergic rhinitis 02/28/2007   KNEE PAIN, LEFT 02/28/2007    PCP: Neda Balk, MD   REFERRING PROVIDER: Syliva Even, MD   REFERRING DIAG:  [phone] (ICD-10-CM) - Right elbow pain  M25.551 (ICD-10-CM) - Right hip pain   THERAPY DIAG:  No diagnosis found.  RATIONALE FOR EVALUATION AND  TREATMENT: Rehabilitation  ONSET DATE: ***  NEXT MD VISIT: 10/25/2023   SUBJECTIVE:   SUBJECTIVE STATEMENT: ***  ***08/30/23 - Dr. Alease Hunter: Sherri Sullivan is a 63 y.o. female who presents to [facility] today for R elbow pain ongoing for about 3+ months. Pt locates pain to lateral aspect of the elbow. Sx are constant, occasional difficulty with gripping on worse days. Pt is RHD. Short-term relief with Asper Cream and Tylenol . Does not currently work, does not play sports but does play  computer games.    Radiates: localized Paresthesia: denies Grip strength: decreased Aggravates: extension Treatments tried: Asper Cream    Additionally she has pain located predominantly at the right lateral hip.  She has some anterior hip and anterior thigh pain as well.  She notes pain with hip flexion standing walking and laying in bed.  She denies pain radiating down her leg past her knee.  No weakness or numbness distally. ***  PAIN: Are you having pain? {OPRCPAIN:27236}  PERTINENT HISTORY: ***  PRECAUTIONS: {Therapy precautions:24002}  HAND DOMINANCE: {Hand Dominance:29389}  RED FLAGS: {PT Red Flags:29287}  WEIGHT BEARING RESTRICTIONS: {Yes ***/No:24003}  FALLS:  Has patient fallen in last 6 months? {fallsyesno:27318}  LIVING ENVIRONMENT: Lives with: {OPRC lives with:25569::lives with their family} Lives in: {Lives in:25570} Stairs: {opstairs:27293} Has following equipment at home: {Assistive devices:23999}  OCCUPATION: ***  PLOF: {PLOF:24004}  PATIENT GOALS: ***   OBJECTIVE: (objective measures completed at initial evaluation unless otherwise dated)  DIAGNOSTIC FINDINGS:  ***08/30/23 - Diagnostic Limited MSK Ultrasound of: Right lateral elbow Intact common extensor tendon origin at lateral epicondyle.  Small avulsion fleck present at superficial portion of lateral epicondyle. Increased vascular activity on Doppler consistent with neovascularity. No visible tears present in the tendon. Impression: Lateral epicondylitis  08/30/23 - DG hip IMPRESSION: Moderate to severe right hip degenerative change.  PATIENT SURVEYS:  Quick Dash: *** LEFS: ***  COGNITION: Overall cognitive status: {cognition:24006}     SENSATION: {sensation:27233}  EDEMA:  {edema:24020}  POSTURE:  {posture:25561}  PALPATION: ***  JOINT MOBILITY TESTING:  ***  CERVICAL ROM:   {AROM/PROM:27142} ROM Eval  Flexion   Extension   Right lateral flexion   Left lateral  flexion   Right rotation   Left rotation    (Blank rows = not tested)  CERVICAL SPECIAL TESTS:  {Cervical special tests:25246}  UPPER EXTREMITY ROM:   {AROM/PROM:27142} ROM Right eval Left eval  Shoulder flexion    Shoulder extension    Shoulder abduction    Shoulder adduction    Shoulder internal rotation    Shoulder external rotation    Elbow flexion    Elbow extension    Wrist flexion    Wrist extension    Wrist ulnar deviation    Wrist radial deviation    Wrist pronation    Wrist supination    (Blank rows = not tested)  UPPER EXTREMITY MMT:  MMT Right eval Left eval  Shoulder flexion    Shoulder extension    Shoulder abduction    Shoulder adduction    Shoulder internal rotation    Shoulder external rotation    Middle trapezius    Lower trapezius    Elbow flexion    Elbow extension    Wrist flexion    Wrist extension    Wrist ulnar deviation    Wrist radial deviation    Wrist pronation    Wrist supination    Grip strength (lbs)    (Blank  rows = not tested)  SHOULDER SPECIAL TESTS: Impingement tests: {shoulder impingement test:25231:a} SLAP lesions: {SLAP lesions:25232} Instability tests: {shoulder instability test:25233} Rotator cuff assessment: {rotator cuff assessment:25234} Biceps assessment: {biceps assessment:25235}  LUMBAR ROM:   {AROM/PROM:27142}  Eval  Flexion   Extension   Right lateral flexion   Left lateral flexion   Right rotation   Left rotation    (Blank rows = not tested)  LUMBAR SPECIAL TESTS:  {lumbar special test:25242}  MUSCLE LENGTH: Hamstrings: Right *** deg; Left *** deg Thomas test: Right *** deg; Left *** deg Hamstrings: *** ITB: *** Piriformis: *** Hip flexors: *** Quads: *** Heelcord: ***  LOWER EXTREMITY ROM:  {AROM/PROM:27142} ROM Right eval Left eval  Hip flexion    Hip extension    Hip abduction    Hip adduction    Hip internal rotation    Hip external rotation    Knee flexion    Knee extension     Ankle dorsiflexion    Ankle plantarflexion    Ankle inversion    Ankle eversion     (Blank rows = not tested)  LOWER EXTREMITY MMT:  MMT Right eval Left eval  Hip flexion    Hip extension    Hip abduction    Hip adduction    Hip internal rotation    Hip external rotation    Knee flexion    Knee extension    Ankle dorsiflexion    Ankle plantarflexion    Ankle inversion    Ankle eversion     (Blank rows = not tested)  LOWER EXTREMITY SPECIAL TESTS:  {LEspecialtests:26242}  FUNCTIONAL TESTS:  {Functional tests:24029}  BED MOBILITY:  {Bed mobility:24027}  TRANSFERS: Assistive device utilized: {Assistive devices:23999}  Sit to stand: {Levels of assistance:24026} Stand to sit: {Levels of assistance:24026} Chair to chair: {Levels of assistance:24026} Floor: {Levels of assistance:24026}  GAIT: Distance walked: *** Assistive device utilized: {Assistive devices:23999} Level of assistance: {Levels of assistance:24026} Gait pattern: {gait characteristics:25376} Comments: ***  STAIRS:  Level of Assistance: {Levels of assistance:24026}  Stair Negotiation Technique: {Stair Technique:27161} with {Rail Assistance:27162}  Number of Stairs: ***   Height of Stairs: ***  Comments: ***   TODAY'S TREATMENT:  ***   PATIENT EDUCATION:  Education details: {Education details:27468}  Person educated: {Person educated:25204} Education method: {Education Method NF:62130} Education comprehension: {Education Comprehension:25206}  HOME EXERCISE PROGRAM: ***  ASSESSMENT:  CLINICAL IMPRESSION: Sherri Sullivan is a 63 y.o. female who was referred to physical therapy for evaluation and treatment for R elbow pain secondary to lateral epicondylitis .   ***   Patient reports onset of *** pain beginning ***. Pain is worse with ***.  Patient has deficits in *** ROM, *** flexibility, *** strength, ***abnormal posture, and TTP with abnormal muscle tension *** which are interfering with  ADLs and are impacting quality of life.  On QuickDASH patient scored ***/100 demonstrating ***% disability.  On LEFS patient scored ***/80 demonstrating *** functional limitation.  Sherri Sullivan will benefit from skilled PT to address above deficits to improve mobility and activity tolerance with decreased pain interference.  OBJECTIVE IMPAIRMENTS: {opptimpairments:25111}.   ACTIVITY LIMITATIONS: {activitylimitations:27494}  PARTICIPATION LIMITATIONS: {participationrestrictions:25113}  PERSONAL FACTORS: {Personal factors:25162} are also affecting patient's functional outcome.   REHAB POTENTIAL: {rehabpotential:25112}  CLINICAL DECISION MAKING: {clinical decision making:25114}  EVALUATION COMPLEXITY: {Evaluation complexity:25115}   GOALS: Goals reviewed with patient? {yes/no:20286}  SHORT TERM GOALS: Target date: ***  Patient will be independent with initial HEP. Baseline: *** Goal status: {GOALSTATUS:25110}  2.  Patient will report at least 25% improvement in *** pain to improve QOL. Baseline: *** Goal status: {GOALSTATUS:25110}  3.  *** Baseline: *** Goal status: {GOALSTATUS:25110}  LONG TERM GOALS: Target date: ***  Patient will be independent with advanced/ongoing HEP to improve outcomes and carryover.  Baseline: *** Goal status: {GOALSTATUS:25110}  2.  Patient will report at least 50-75% improvement in *** pain to improve QOL. Baseline: *** Goal status: {GOALSTATUS:25110}  3.  Patient to demonstrate ability to achieve and maintain good spinal alignment/posturing and body mechanics needed for daily activities. Baseline: *** Goal status: {GOALSTATUS:25110}  4.  Patient will demonstrate functional pain free lumbar ROM to perform ADLs.   Baseline: Refer to above lumbar ROM table Goal status: {GOALSTATUS:25110}  5.  Patient will demonstrate improved *** AROM to {Functional status:27472} to allow for normal gait and stair mechanics. Baseline: Refer to above LE ROM  table Goal status: {GOALSTATUS:25110}  6.  Patient will demonstrate improved *** strength to >/= ***/5 for functional UE use. Baseline: Refer to above UE MMT table Goal status: {GOALSTATUS:25110}  7.  Patient will demonstrate improved *** strength to >/= ***/5 for improved stability and ease of mobility. Baseline: Refer to above LE MMT table Goal status: {GOALSTATUS:25110}  8.  Patient will be able to ambulate with or w/o LRAD and normal gait pattern without increased pain to access community.  Baseline: *** Goal status: {GOALSTATUS:25110}  9. Patient will be able to ascend/descend stairs with 1 HR and reciprocal step pattern safely to access home and community.  Baseline: *** Goal status: {GOALSTATUS:25110}  10.  Patient will report *** on *** (patient reported outcome measure) to demonstrate improved functional ability. Baseline: *** Goal status: {GOALSTATUS:25110}  11.  Patient will report *** on *** (patient reported outcome measure) to demonstrate improved functional ability. Baseline: *** Goal status: {GOALSTATUS:25110}  12.  Patient will demonstrate at least 19/24 on DGI to decrease risk of falls. Baseline: *** Goal status: {GOALSTATUS:25110}   13.  *** Baseline: *** Goal status: {GOALSTATUS:25110}   PLAN:  PT FREQUENCY: {rehab frequency:25116}  PT DURATION: {rehab duration:25117}  PLANNED INTERVENTIONS: {rehab planned interventions:25118::97110-Therapeutic exercises,97530- Therapeutic [phone]- Neuromuscular re-education,97535- Self JXBJ,47829- Manual therapy}  PLAN FOR NEXT SESSION: ***   Francisco Irving, PT 09/15/2023, 12:53 PM

## 2023-09-20 ENCOUNTER — Other Ambulatory Visit: Payer: Self-pay

## 2023-09-20 ENCOUNTER — Ambulatory Visit: Attending: Family Medicine | Admitting: Physical Therapy

## 2023-09-20 ENCOUNTER — Encounter: Payer: Self-pay | Admitting: Physical Therapy

## 2023-09-20 DIAGNOSIS — M25551 Pain in right hip: Secondary | ICD-10-CM | POA: Insufficient documentation

## 2023-09-20 DIAGNOSIS — M25521 Pain in right elbow: Secondary | ICD-10-CM | POA: Diagnosis not present

## 2023-09-20 DIAGNOSIS — M6281 Muscle weakness (generalized): Secondary | ICD-10-CM | POA: Diagnosis not present

## 2023-09-20 DIAGNOSIS — R2689 Other abnormalities of gait and mobility: Secondary | ICD-10-CM | POA: Diagnosis not present

## 2023-09-21 ENCOUNTER — Other Ambulatory Visit: Payer: Self-pay | Admitting: Family Medicine

## 2023-10-03 ENCOUNTER — Encounter: Payer: Self-pay | Admitting: Physical Therapy

## 2023-10-03 ENCOUNTER — Ambulatory Visit: Payer: Self-pay | Attending: Family Medicine | Admitting: Physical Therapy

## 2023-10-03 DIAGNOSIS — M25521 Pain in right elbow: Secondary | ICD-10-CM | POA: Insufficient documentation

## 2023-10-03 DIAGNOSIS — M6281 Muscle weakness (generalized): Secondary | ICD-10-CM | POA: Diagnosis not present

## 2023-10-03 DIAGNOSIS — M25551 Pain in right hip: Secondary | ICD-10-CM | POA: Insufficient documentation

## 2023-10-03 DIAGNOSIS — R2689 Other abnormalities of gait and mobility: Secondary | ICD-10-CM | POA: Insufficient documentation

## 2023-10-03 NOTE — Therapy (Signed)
 OUTPATIENT PHYSICAL THERAPY TREATMENT   Patient Name: Sherri Sullivan MRN: [id number] DOB:1961-02-11, 63 y.o., female Today's Date: 10/03/2023  END OF SESSION:  PT End of Session - 10/03/23 0931     Visit Number 2    Date for PT Re-Evaluation 12/13/23    Authorization Type [employer]    Authorization Time Period Prior authorization not required    Progress Note Due on Visit 10    PT Start Time 0931    PT Stop Time 1015    PT Time Calculation (min) 44 min    Activity Tolerance Patient tolerated treatment well    Behavior During Therapy [facility] for tasks assessed/performed           Past Medical History:  Diagnosis Date   Anginal pain (HCC)    Arthritis    L shoulder- has had injections, degenerative changes in lumbar spine    Back pain    Chicken pox as a child   Constipation    Dizziness    Dysrhythmia 2013   SVT- treated by ablation by Dr. Waddell to f/u with as needed basis    Encounter for preventative adult health care exam with abnormal findings 02/09/2015   HTN (hypertension)    Hypokalemia 02/09/2015   Joint pain    Leg edema    Measles as a child   Measles as a child   Obesity    Panic attack    during episode of feeling to crowded    Preventative health care 02/09/2015   Seasonal allergies    SOB (shortness of breath) 09/23/2011   a little bit; at rest; before ablation, SOB again now (08/2014- due to lack  of exercise)    SVT (supraventricular tachycardia) (HCC)    Uterine fibroid 12/17/2012   Cervical polyp per patient Follows with [facility], Dr Tanda Mulch   Past Surgical History:  Procedure Laterality Date   ABDOMINAL HYSTERECTOMY  1990's   CARDIAC ELECTROPHYSIOLOGY STUDY AND ABLATION  09/23/11   MAXIMUM ACCESS (MAS)POSTERIOR LUMBAR INTERBODY FUSION (PLIF) 1 LEVEL N/A 09/24/2014   Procedure: L5-S1 MAS PLIF ;  Surgeon: Fairy Levels, MD;  Location: [facility] NEURO ORS;  Service: Neurosurgery;  Laterality: N/A;  L5-S1 MAS PLIF fusion    SUPRAVENTRICULAR TACHYCARDIA ABLATION N/A 09/23/2011   Procedure: SUPRAVENTRICULAR TACHYCARDIA ABLATION;  Surgeon: Danelle LELON Waddell, MD;  Location: [facility] CATH LAB;  Service: Cardiovascular;  Laterality: N/A;   TUBAL LIGATION  1980's   Patient Active Problem List   Diagnosis Date Noted   Severe sleep apnea 11/15/2022   Carpal tunnel syndrome 11/01/2022   Numbness of hand 11/01/2022   Cervical spondylosis 11/01/2022   Cervical radiculopathy 11/01/2022   Needs sleep apnea assessment 08/16/2022   H/O colonoscopy with polypectomy 08/16/2022   History of COVID-19 12/06/2021   Dyspnea 11/27/2020   DDD (degenerative disc disease), cervical 11/27/2020   Atypical chest pain 01/18/2019   Acute left-sided thoracic back pain 01/18/2019   Hyperglycemia 01/09/2018   Colon polyp 08/29/2017   Pain in right knee 06/28/2017   Pain in left knee 06/28/2017   Vitamin D  deficiency 02/25/2017   Polyneuropathy 01/21/2016   Hypokalemia 02/09/2015   Encounter for preventative adult health care exam with abnormal findings 02/09/2015   Preventative health care 02/09/2015   Spondylolisthesis of lumbar region 09/24/2014   Spinal stenosis 05/22/2014   Spondylolisthesis at L5-S1 level 05/22/2014   Lumbar radiculopathy 05/22/2014   Neck pain 04/08/2014   Muscle cramps 01/13/2014   Tendinopathy  of rotator cuff 11/28/2013   Essential hypertension, benign 11/28/2013   Urinary frequency 08/17/2013   Uterine fibroid 12/17/2012   Obesity    SVT (supraventricular tachycardia) (HCC) 09/21/2011   LOW BACK PAIN 01/04/2008   Hyperlipidemia 02/28/2007   Allergic rhinitis 02/28/2007   KNEE PAIN, LEFT 02/28/2007    PCP: Domenica Harlene LABOR, MD   REFERRING PROVIDER: Joane Artist RAMAN, MD   REFERRING DIAG:  [id number] (ICD-10-CM) - Right elbow pain  M25.551 (ICD-10-CM) - Right hip pain   THERAPY DIAG:  Pain in right hip  Pain in right elbow  Muscle weakness (generalized)  Other abnormalities of gait and  mobility  RATIONALE FOR EVALUATION AND TREATMENT: Rehabilitation  ONSET DATE: R hip ~1 year; R elbow 3-4 months  NEXT MD VISIT: 10/25/2023   SUBJECTIVE:   SUBJECTIVE STATEMENT: Pt denies elbow pain today, stating the elbow has been doing pretty good recently, but still having R hip pain.  She did not try the ice massage.  EVAL:  Pt reports her R elbow has been getting better since she saw the MD - had been hurting for ~3 months prior to seeing MD.  She has been wearing elbow brace recommended by MD but not sure if she is positioning it properly as it continues to slide down her arm (does not have brace with her on eval).  Pain localized to R lateral epicondyle.  Still has difficulty lifting things with R hand.  R hip has been hurting closer to 1 year and continues to worsen.  Pain originates at lateral hip and radiates into R groin and anterior thigh, but denies numbness or tingling.  She has difficulty lifting her R foot in/out of a car.  PAIN: Are you having pain? Yes: NPRS scale: 7/10   Pain location: R lateral hip into groin and R anterior thigh  Pain description: stabbing  Aggravating factors: prolonged standing or walking, climbing stairs, putting on socks, rolling over in bed  Relieving factors: nothing so far (tried heat and ice), maybe rubbing motion when applying Aspercreme   Are you having pain? No and Yes: NPRS scale: 0/10  Pain location: lateral epicondyle of R elbow Pain description: burning  Aggravating factors: repetitive motion - folding laundry, computer, lifting heavy items Relieving factors: brace, ice, massage    PERTINENT HISTORY: Arthritis, HTN, s/p R CTR 2024, lumbar spondylolisthesis with radiculopathy, s/p L5-S1 MAS PLIF 2016, cervical spondylosis/DDD, cervical radiculopathy, dizziness, LE edema, RTC tendinopathy, obesity, SVT s/p ablation, h/o B knee pain  PRECAUTIONS: None  HAND DOMINANCE: Right  RED FLAGS: None  WEIGHT BEARING RESTRICTIONS:  No  FALLS:  Has patient fallen in last 6 months? No  LIVING ENVIRONMENT: Lives with: lives with their spouse Lives in: House/apartment Stairs: Yes: Internal: 14 x 2 steps; on right going up and External: 3+4 steps; none Has following equipment at home: None  OCCUPATION: Retired  PLOF: Independent and Leisure: binge watching Netflix, light gardening   PATIENT GOALS: Better mobility with less pain.   OBJECTIVE: (objective measures completed at initial evaluation unless otherwise dated)  DIAGNOSTIC FINDINGS:  08/30/23 - Diagnostic Limited MSK Ultrasound of: Right lateral elbow Intact common extensor tendon origin at lateral epicondyle.  Small avulsion fleck present at superficial portion of lateral epicondyle. Increased vascular activity on Doppler consistent with neovascularity. No visible tears present in the tendon. Impression: Lateral epicondylitis  08/30/23 - DG hip IMPRESSION: Moderate to severe right hip degenerative change.  PATIENT SURVEYS:  Quick Dash: 43.2 / 100 =  43.2 % LEFS: 19 / 80 = 23.8 %, moderate functional limitation  COGNITION: Overall cognitive status: Within functional limits for tasks assessed     SENSATION: WFL  EDEMA:  N/A  POSTURE:  rounded shoulders, forward head, flexed trunk , and weight shift left  PALPATION: Increased muscle tension with TTP over R glutes and piriformis.  Mildly TTP over R greater trochanter.  TTP over R wrist extensor group and lateral epicondyle.  UPPER EXTREMITY ROM:  Grossly WFL  UPPER EXTREMITY MMT:  MMT Right eval Left eval  Shoulder flexion 4- 4  Shoulder extension 4 4  Shoulder abduction 4- 4+  Shoulder adduction    Shoulder internal rotation 4+ 4+  Shoulder external rotation 4- 4  Middle trapezius 3+ 4-  Lower trapezius 3- 3-  Elbow flexion 4- 4+  Elbow extension 4- 4-  Wrist flexion    Wrist extension    Wrist ulnar deviation    Wrist radial deviation    Wrist pronation    Wrist supination     Grip strength (lbs) 13.67# 26.33#  (Blank rows = not tested)  MUSCLE LENGTH: Hamstrings: mildly tight L>R ITB: mod tight B Piriformis: severe tight R>L Hip flexors: mod tight B Quads: mild tight B Heelcord:   LOWER EXTREMITY ROM:  Active ROM Right eval Left eval  Hip flexion 74 91  Hip extension    Hip abduction    Hip adduction    Hip internal rotation 39 44  Hip external rotation 7 27  Knee flexion    Knee extension    Ankle dorsiflexion    Ankle plantarflexion    Ankle inversion    Ankle eversion     (Blank rows = not tested)  LOWER EXTREMITY MMT:  MMT Right eval Left eval  Hip flexion 3+ p! 4-  Hip extension 3- 3-  Hip abduction 3- 3+  Hip adduction 2+ 3-  Hip internal rotation 4 4+  Hip external rotation 2 4-  Knee flexion 4- 4  Knee extension 4- 4  Ankle dorsiflexion 4- 4  Ankle plantarflexion    Ankle inversion    Ankle eversion     (Blank rows = not tested)  LOWER EXTREMITY SPECIAL TESTS:  Hip special tests: Belvie (FABER) test: positive , Thomas test: positive , and Ober's test: negative  FUNCTIONAL TESTS:  5 times sit to stand: 27.40 sec  TRANSFERS: Assistive device utilized: None  Sit to stand: Complete Independence and Modified independence -typically relies on UE assist but able to complete sit to stand without UE assist although tendency for weight shift to L with R genu valgum Stand to sit: Complete Independence and Modified independence Chair to chair: Modified independence Floor: NT   TODAY'S TREATMENT:  10/03/2023 THERAPEUTIC EXERCISE: To improve strength, endurance, ROM, and flexibility.  Demonstration, verbal and tactile cues throughout for technique.  NuStep - L4 x 6 min (UE/LE to promote muscle warm-up and tissue perfusion) Supine R HS stretch with strap x 30 Hooklying R HS stretch with strap x 30 - preferred  Supine R ITB stretch with strap x 30 - painful Unable to achieve R SKTC or R bent knee glute stretch position in  supine  Unable to achieve R figure-4 or KTOS piriformis stretch position in hooklying  Mod thomas R quad/hip flexor stretch with strap 2 x 30 Unable to achieve R side-sitting figure-4/pigeon piriformis stretch position in sitting Seated R figure-4 piriformis stretch with foot positioned on 9 stool 2 x 30 Standing R  glute stretch with foot on wooden box 2 x 30 Seated B hip ADD ball squeeze isometric 10 x 5, 2 sets Seated RTB B hip ABD/ER clam 10 x 3-5, 2 sets Seated RTB hip flexion march x 10, cues not to lean back when lifting R LE   09/20/2023 - Eval SELF CARE:  Reviewed eval findings and role of PT in addressing identified deficits as well as instruction in ice massage for R lateral epicondylitis (see below).    PATIENT EDUCATION:  Education details: initial HEP  Person educated: Patient Education method: Explanation, Demonstration, Verbal cues, Tactile cues, Handouts, and MedBridgeGO app access provided Education comprehension: verbalized understanding, returned demonstration, verbal cues required, tactile cues required, and needs further education  HOME EXERCISE PROGRAM: *Pt using MedBridgeGO app  Access Code: [email or web address] URL: [email or web address] Date: 10/03/2023 Prepared by: Elijah Hidden  Exercises - Hip Flexor Stretch with Strap on Table (Mirrored)  - 2-3 x daily - 7 x weekly - 3 reps - 30 sec hold - Seated Hamstring Stretch  - 2-3 x daily - 7 x weekly - 3 reps - 30 sec hold - Seated Piriformis Stretch  - 2-3 x daily - 7 x weekly - 3 reps - 30 sec hold - Standing Gluteal Stretch on Chair  - 2-3 x daily - 7 x weekly - 3 reps - 30 sec hold - Standing ITB Stretch (Mirrored)  - 2-3 x daily - 7 x weekly - 3 reps - 30 sec hold - Seated Hip Adduction Squeeze with Ball  - 1 x daily - 7 x weekly - 2 sets - 10 reps - 3-5 sec hold - Seated Hip Abduction with Resistance  - 1 x daily - 7 x weekly - 2 sets - 10 reps - 3 sec hold - Seated March with Resistance  - 1 x  daily - 7 x weekly - 2 sets - 10 reps - 3 sec hold  Patient Education - Ice Massage   ASSESSMENT:  CLINICAL IMPRESSION: Sherri Sullivan reports her R elbow pain has been better with no pain currently, however she continues to experience R hip pain.  Initiated instruction in LE HEP focusing on R hip flexibility and strengthening to promote increased ROM.  Due to restricted ROM/severe limitations in flexibility, patient had difficulty achieving position for several attempted stretches however we were able to find alternative positions that seem to work well for her.  Today's focus was on the R hip as she was not having any R elbow pain, however we will plan for instruction in stretches and strengthening to address R elbow to prevent recurrence of pain next visit. Sherri Sullivan will benefit from continued skilled PT to address ongoing ROM, flexibility and strength deficits to improve mobility and activity tolerance with decreased pain interference.   EVAL:  Sherri Sullivan is a 63 y.o. female who was referred to physical therapy for evaluation and treatment for R hip and R elbow pain.  Patient reports onset of R hip pain beginning ~1+ year ago.  Pain is worse with prolonged standing or walking, climbing stairs, putting on socks, rolling over in bed and limits sleeping.  R elbow pain with more recent onset ~3-4 months ago and attributed to secondary to lateral epicondylitis.  Pain is worse with repetitive motion - folding laundry, working/playing on computer, lifting heavy items.  Patient has deficits in R hip ROM, proximal LE flexibility, R>L B LE, UE and grip strength, abnormal posture, and TTP with abnormal muscle tension which are  interfering with ADLs and are impacting quality of life.  On LEFS patient scored 19/80 demonstrating moderate functional limitation.  On QuickDASH patient scored 43.2/100 demonstrating 43.2% disability.  Sherri Sullivan will benefit from skilled PT to address above deficits to improve mobility and activity  tolerance with decreased pain interference.  OBJECTIVE IMPAIRMENTS: Abnormal gait, decreased activity tolerance, decreased balance, decreased endurance, decreased knowledge of condition, decreased mobility, difficulty walking, decreased ROM, decreased strength, increased fascial restrictions, impaired perceived functional ability, increased muscle spasms, impaired flexibility, impaired UE functional use, improper body mechanics, postural dysfunction, and pain.   ACTIVITY LIMITATIONS: carrying, lifting, sitting, standing, squatting, sleeping, stairs, transfers, bed mobility, dressing, and locomotion level  PARTICIPATION LIMITATIONS: meal prep, cleaning, laundry, driving, shopping, and community activity  PERSONAL FACTORS: Fitness, Past/current experiences, Time since onset of injury/illness/exacerbation, and 3+ comorbidities: Arthritis, HTN, s/p R CTR 2024, lumbar spondylolisthesis with radiculopathy, s/p L5-S1 MAS PLIF 2016, cervical spondylosis/DDD, cervical radiculopathy, dizziness, LE edema, RTC tendinopathy, obesity, SVT s/p ablation, h/o B knee pain are also affecting patient's functional outcome.   REHAB POTENTIAL: Good  CLINICAL DECISION MAKING: Unstable/unpredictable  EVALUATION COMPLEXITY: High   GOALS: Goals reviewed with patient? Yes  SHORT TERM GOALS: Target date: 11/01/2023  Patient will be independent with initial HEP. Baseline:  Goal status: IN PROGRESS - 10/03/23 - initial LE HEP provided today  2.  Patient will report at least 25% improvement in R hip and elbow pain to improve QOL. Baseline: R hip - 9/10 on eval, up to 10/10 at worst; R elbow - 3/10 on eval, up to 9/10 at worst Goal status: IN PROGRESS - 10/03/23 - no R elbow pain today  3.  Patient will improve R hip flexion AROM to >/= 90 and R hip ER AROM to >/= 15 to increase ease of car transfers. Baseline: Refer to above LE ROM table Goal status: INITIAL  LONG TERM GOALS: Target date: 12/13/2023   Patient will be  independent with advanced/ongoing HEP to improve outcomes and carryover.  Baseline:  Goal status: INITIAL  2.  Patient will report at least 50-75% improvement in R hip and elbow pain to improve QOL. Baseline: R hip - 9/10 on eval, up to 10/10 at worst; R elbow - 3/10 on eval, up to 9/10 at worst Goal status: INITIAL  3.  Patient will demonstrate functional pain free R hip ROM to perform ADLs.   Baseline: Refer to above LE ROM table Goal status: INITIAL  4.  Patient will demonstrate improved B UE and scapular strength to >/= 4 to 4+/5 for functional UE use. Baseline: Refer to above UE MMT table Goal status: INITIAL  5.  Patient will demonstrate improved B LE strength to >/= 4 to 4+/5 for improved stability and ease of mobility. Baseline: Refer to above LE MMT table Goal status: INITIAL  6.  Patient will be able to ambulate with normal gait pattern without increased pain and be able to ascend/descend stairs with 1 HR and reciprocal step pattern safely to access home and community.  Baseline:  Goal status: INITIAL  7. Patient will report >/= 40/80 on LEFS to demonstrate improved functional ability. Baseline: 19 / 80 = 23.8 % Goal status: INITIAL  8.  Patient will report </= 29% on QuickDASH to demonstrate improved functional ability. Baseline: 43.2 / 100 = 43.2 % Goal status: INITIAL  9.  Patient will report <25% sleep disturbance due to pain. Baseline: Both R hip and elbow pain interfere with sleep Goal status: INITIAL  PLAN:  PT FREQUENCY: 2x/week  PT DURATION: 12 weeks  PLANNED INTERVENTIONS: 97164- PT Re-evaluation, 97750- Physical Performance Testing, 97110-Therapeutic exercises, 97530- Therapeutic activity, 97112- Neuromuscular re-education, [phone]- Self Care, [id number]- Manual therapy, [phone]- Gait training, [phone]- Aquatic Therapy, [phone]- Electrical stimulation (unattended), [phone]- Ultrasound, [id number]- Ionotophoresis 4mg /ml Dexamethasone , 79439 (1-2 muscles), [phone] (3+  muscles)- Dry Needling, Patient/Family education, Balance training, Stair training, Taping, Joint mobilization, Spinal mobilization, DME instructions, Cryotherapy, and Moist heat  PLAN FOR NEXT SESSION: Review initial LE HEP and update/modify as indicated; create initial HEP for UE flexibility and strengthening; progress proximal LE stretching and R hip ROM, lumbopelvic strengthening; MT +/- TPDN and/or modalities to address abnormal muscle tension and pain - possible trial of ionto patch to R lateral epicondyle/wrist extensor group and/or R greater trochanter   Elijah CHRISTELLA Hidden, PT 10/03/2023, 11:25 AM

## 2023-10-05 ENCOUNTER — Ambulatory Visit: Admitting: Physical Therapy

## 2023-10-05 ENCOUNTER — Encounter: Payer: Self-pay | Admitting: Physical Therapy

## 2023-10-05 DIAGNOSIS — R2689 Other abnormalities of gait and mobility: Secondary | ICD-10-CM

## 2023-10-05 DIAGNOSIS — M6281 Muscle weakness (generalized): Secondary | ICD-10-CM

## 2023-10-05 DIAGNOSIS — M25521 Pain in right elbow: Secondary | ICD-10-CM

## 2023-10-05 DIAGNOSIS — M25551 Pain in right hip: Secondary | ICD-10-CM | POA: Diagnosis not present

## 2023-10-05 NOTE — Therapy (Addendum)
 OUTPATIENT PHYSICAL THERAPY TREATMENT   Patient Name: Sherri Sullivan MRN: [id number] DOB:24-Jun-1960, 63 y.o., female Today's Date: 10/05/2023  END OF SESSION:  PT End of Session - 10/05/23 1154     Visit Number 3    Date for PT Re-Evaluation 12/13/23    Authorization Type [employer]    Authorization Time Period Prior authorization not required    Progress Note Due on Visit 10    PT Start Time 1154    PT Stop Time 1239    PT Time Calculation (min) 45 min    Activity Tolerance Patient tolerated treatment well    Behavior During Therapy WFL for tasks assessed/performed            Past Medical History:  Diagnosis Date   Anginal pain (HCC)    Arthritis    L shoulder- has had injections, degenerative changes in lumbar spine    Back pain    Chicken pox as a child   Constipation    Dizziness    Dysrhythmia 2013   SVT- treated by ablation by Dr. Waddell to f/u with as needed basis    Encounter for preventative adult health care exam with abnormal findings 02/09/2015   HTN (hypertension)    Hypokalemia 02/09/2015   Joint pain    Leg edema    Measles as a child   Measles as a child   Obesity    Panic attack    during episode of feeling to crowded    Preventative health care 02/09/2015   Seasonal allergies    SOB (shortness of breath) 09/23/2011   a little bit; at rest; before ablation, SOB again now (08/2014- due to lack  of exercise)    SVT (supraventricular tachycardia) (HCC)    Uterine fibroid 12/17/2012   Cervical polyp per patient Follows with [facility], Dr Tanda Mulch   Past Surgical History:  Procedure Laterality Date   ABDOMINAL HYSTERECTOMY  1990's   CARDIAC ELECTROPHYSIOLOGY STUDY AND ABLATION  09/23/11   MAXIMUM ACCESS (MAS)POSTERIOR LUMBAR INTERBODY FUSION (PLIF) 1 LEVEL N/A 09/24/2014   Procedure: L5-S1 MAS PLIF ;  Surgeon: Fairy Levels, MD;  Location: [facility];  Service: Neurosurgery;  Laterality: N/A;  L5-S1 MAS PLIF fusion    SUPRAVENTRICULAR TACHYCARDIA ABLATION N/A 09/23/2011   Procedure: SUPRAVENTRICULAR TACHYCARDIA ABLATION;  Surgeon: Danelle LELON Waddell, MD;  Location: [facility] CATH LAB;  Service: Cardiovascular;  Laterality: N/A;   TUBAL LIGATION  1980's   Patient Active Problem List   Diagnosis Date Noted   Severe sleep apnea 11/15/2022   Carpal tunnel syndrome 11/01/2022   Numbness of hand 11/01/2022   Cervical spondylosis 11/01/2022   Cervical radiculopathy 11/01/2022   Needs sleep apnea assessment 08/16/2022   H/O colonoscopy with polypectomy 08/16/2022   History of COVID-19 12/06/2021   Dyspnea 11/27/2020   DDD (degenerative disc disease), cervical 11/27/2020   Atypical chest pain 01/18/2019   Acute left-sided thoracic back pain 01/18/2019   Hyperglycemia 01/09/2018   Colon polyp 08/29/2017   Pain in right knee 06/28/2017   Pain in left knee 06/28/2017   Vitamin D  deficiency 02/25/2017   Polyneuropathy 01/21/2016   Hypokalemia 02/09/2015   Encounter for preventative adult health care exam with abnormal findings 02/09/2015   Preventative health care 02/09/2015   Spondylolisthesis of lumbar region 09/24/2014   Spinal stenosis 05/22/2014   Spondylolisthesis at L5-S1 level 05/22/2014   Lumbar radiculopathy 05/22/2014   Neck pain 04/08/2014   Muscle cramps 01/13/2014  Tendinopathy of rotator cuff 11/28/2013   Essential hypertension, benign 11/28/2013   Urinary frequency 08/17/2013   Uterine fibroid 12/17/2012   Obesity    SVT (supraventricular tachycardia) (HCC) 09/21/2011   LOW BACK PAIN 01/04/2008   Hyperlipidemia 02/28/2007   Allergic rhinitis 02/28/2007   KNEE PAIN, LEFT 02/28/2007    PCP: Domenica Harlene LABOR, MD   REFERRING PROVIDER: Joane Artist RAMAN, MD   REFERRING DIAG:  [phone] (ICD-10-CM) - Right elbow pain  M25.551 (ICD-10-CM) - Right hip pain   THERAPY DIAG:  Pain in right hip  Pain in right elbow  Muscle weakness (generalized)  Other abnormalities of gait and  mobility  RATIONALE FOR EVALUATION AND TREATMENT: Rehabilitation  ONSET DATE: R hip ~1 year; R elbow 3-4 months  NEXT MD VISIT: 10/25/2023   SUBJECTIVE:   SUBJECTIVE STATEMENT: Pt reports she had a very busy 4th of July weekend with a family reunion. Between this and the exercises using muscles she is not used to, her hip is hurting more today.  She continues to deny R elbow pain today.  EVAL:  Pt reports her R elbow has been getting better since she saw the MD - had been hurting for ~3 months prior to seeing MD.  She has been wearing elbow brace recommended by MD but not sure if she is positioning it properly as it continues to slide down her arm (does not have brace with her on eval).  Pain localized to R lateral epicondyle.  Still has difficulty lifting things with R hand.  R hip has been hurting closer to 1 year and continues to worsen.  Pain originates at lateral hip and radiates into R groin and anterior thigh, but denies numbness or tingling.  She has difficulty lifting her R foot in/out of a car.  PAIN: Are you having pain? Yes: NPRS scale: 8/10   Pain location: R lateral hip into groin and R anterior thigh  Pain description: stabbing  Aggravating factors: prolonged standing or walking, climbing stairs, putting on socks, rolling over in bed  Relieving factors: nothing so far (tried heat and ice), maybe rubbing motion when applying Aspercreme   Are you having pain? No - lateral epicondyle of R elbow  PERTINENT HISTORY: Arthritis, HTN, s/p R CTR 2024, lumbar spondylolisthesis with radiculopathy, s/p L5-S1 MAS PLIF 2016, cervical spondylosis/DDD, cervical radiculopathy, dizziness, LE edema, RTC tendinopathy, obesity, SVT s/p ablation, h/o B knee pain  PRECAUTIONS: None  HAND DOMINANCE: Right  RED FLAGS: None  WEIGHT BEARING RESTRICTIONS: No  FALLS:  Has patient fallen in last 6 months? No  LIVING ENVIRONMENT: Lives with: lives with their spouse Lives in:  House/apartment Stairs: Yes: Internal: 14 x 2 steps; on right going up and External: 3+4 steps; none Has following equipment at home: None  OCCUPATION: Retired  PLOF: Independent and Leisure: binge watching Netflix, light gardening   PATIENT GOALS: Better mobility with less pain.   OBJECTIVE: (objective measures completed at initial evaluation unless otherwise dated)  DIAGNOSTIC FINDINGS:  08/30/23 - Diagnostic Limited MSK Ultrasound of: Right lateral elbow Intact common extensor tendon origin at lateral epicondyle.  Small avulsion fleck present at superficial portion of lateral epicondyle. Increased vascular activity on Doppler consistent with neovascularity. No visible tears present in the tendon. Impression: Lateral epicondylitis  08/30/23 - DG hip IMPRESSION: Moderate to severe right hip degenerative change.  PATIENT SURVEYS:  Quick Dash: 43.2 / 100 = 43.2 % LEFS: 19 / 80 = 23.8 %, moderate functional limitation  COGNITION:  Overall cognitive status: Within functional limits for tasks assessed     SENSATION: WFL  EDEMA:  N/A  POSTURE:  rounded shoulders, forward head, flexed trunk , and weight shift left  PALPATION: Increased muscle tension with TTP over R glutes and piriformis.  Mildly TTP over R greater trochanter.  TTP over R wrist extensor group and lateral epicondyle.  UPPER EXTREMITY ROM:  Grossly WFL  UPPER EXTREMITY MMT:  MMT Right eval Left eval  Shoulder flexion 4- 4  Shoulder extension 4 4  Shoulder abduction 4- 4+  Shoulder adduction    Shoulder internal rotation 4+ 4+  Shoulder external rotation 4- 4  Middle trapezius 3+ 4-  Lower trapezius 3- 3-  Elbow flexion 4- 4+  Elbow extension 4- 4-  Wrist flexion    Wrist extension    Wrist ulnar deviation    Wrist radial deviation    Wrist pronation    Wrist supination    Grip strength (lbs) 13.67# 26.33#  (Blank rows = not tested)  MUSCLE LENGTH: Hamstrings: mildly tight L>R ITB: mod tight  B Piriformis: severe tight R>L Hip flexors: mod tight B Quads: mild tight B Heelcord:   LOWER EXTREMITY ROM:  Active ROM Right eval Left eval  Hip flexion 74 91  Hip extension    Hip abduction    Hip adduction    Hip internal rotation 39 44  Hip external rotation 7 27  Knee flexion    Knee extension    Ankle dorsiflexion    Ankle plantarflexion    Ankle inversion    Ankle eversion     (Blank rows = not tested)  LOWER EXTREMITY MMT:  MMT Right eval Left eval  Hip flexion 3+ p! 4-  Hip extension 3- 3-  Hip abduction 3- 3+  Hip adduction 2+ 3-  Hip internal rotation 4 4+  Hip external rotation 2 4-  Knee flexion 4- 4  Knee extension 4- 4  Ankle dorsiflexion 4- 4  Ankle plantarflexion    Ankle inversion    Ankle eversion     (Blank rows = not tested)  LOWER EXTREMITY SPECIAL TESTS:  Hip special tests: Belvie (FABER) test: positive , Thomas test: positive , and Ober's test: negative  FUNCTIONAL TESTS:  5 times sit to stand: 27.40 sec  TRANSFERS: Assistive device utilized: None  Sit to stand: Complete Independence and Modified independence -typically relies on UE assist but able to complete sit to stand without UE assist although tendency for weight shift to L with R genu valgum Stand to sit: Complete Independence and Modified independence Chair to chair: Modified independence Floor: NT   TODAY'S TREATMENT:  10/05/2023  THERAPEUTIC EXERCISE: To improve strength, endurance, ROM, and flexibility.  Demonstration, verbal and tactile cues throughout for technique.  Rec Bike - L1 x 6 min Seated lunge position R hip flexor stretch 3 x 30 Seated R hip hinge HS stretch with foot on floor 2 x 30 Seated R figure-4 piriformis stretch with foot positioned on 9 stool 2 x 30 Standing R glute stretch with foot on wooden box 2 x 30 Seated B hip ADD ball squeeze isometric 10 x 5, 2 sets Seated RTB B hip ABD/ER clam 10 x 3-5, 2 sets Seated RTB hip flexion march x 10,  cues not to lean back when lifting R LE  MANUAL THERAPY: To promote normalized muscle tension, improved flexibility, improved joint mobility, increased ROM, and reduced pain utilizing connective tissue massage, therapeutic massage, and manual TP  therapy. STM/DTM, manual TPR, and IASTM with foam roller to R iliopsoas, TFL and RF  SELF CARE: Provided education to prevent loss of gains achieved with physical therapy.  Provided instruction in self-STM techniques to R hip flexors using tennis ball, foam roller or rolling pin.    10/03/2023 THERAPEUTIC EXERCISE: To improve strength, endurance, ROM, and flexibility.  Demonstration, verbal and tactile cues throughout for technique.  NuStep - L4 x 6 min (UE/LE to promote muscle warm-up and tissue perfusion) Supine R HS stretch with strap x 30 Hooklying R HS stretch with strap x 30 - preferred  Supine R ITB stretch with strap x 30 - painful Unable to achieve R SKTC or R bent knee glute stretch position in supine  Unable to achieve R figure-4 or KTOS piriformis stretch position in hooklying  Mod thomas R quad/hip flexor stretch with strap 2 x 30 Unable to achieve R side-sitting figure-4/pigeon piriformis stretch position in sitting Seated R figure-4 piriformis stretch with foot positioned on 9 stool 2 x 30 Standing R glute stretch with foot on wooden box 2 x 30 Seated B hip ADD ball squeeze isometric 10 x 5, 2 sets Seated RTB B hip ABD/ER clam 10 x 3-5, 2 sets Seated RTB hip flexion march x 10, cues not to lean back when lifting R LE   09/20/2023 - Eval SELF CARE:  Reviewed eval findings and role of PT in addressing identified deficits as well as instruction in ice massage for R lateral epicondylitis (see below).    PATIENT EDUCATION:  Education details: HEP review, HEP modification - positional change from mod Thomas to sitting for hip flexor stretch, and self-STM techniques to R hip flexor group using tennis ball, foam roller or rolling  pin  Person educated: Patient Education method: Explanation, Demonstration, Verbal cues, Tactile cues, and MedBridgeGO app updated Education comprehension: verbalized understanding, returned demonstration, verbal cues required, tactile cues required, and needs further education  HOME EXERCISE PROGRAM: *Pt using MedBridgeGO app  Access Code: [email or web address] URL: [email or web address] Date: 10/05/2023 Prepared by: Elijah Hidden  Exercises - Seated Hip Flexor Stretch  - 2-3 x daily - 7 x weekly - 3 reps - 30 sec hold - Seated Hamstring Stretch  - 2-3 x daily - 7 x weekly - 3 reps - 30 sec hold - Seated Piriformis Stretch  - 2-3 x daily - 7 x weekly - 3 reps - 30 sec hold - Standing Gluteal Stretch on Chair  - 2-3 x daily - 7 x weekly - 3 reps - 30 sec hold - Standing ITB Stretch (Mirrored)  - 2-3 x daily - 7 x weekly - 3 reps - 30 sec hold - Seated Hip Adduction Squeeze with Ball  - 1 x daily - 7 x weekly - 2 sets - 10 reps - 3-5 sec hold - Seated Hip Abduction with Resistance  - 1 x daily - 7 x weekly - 2 sets - 10 reps - 3 sec hold - Seated March with Resistance  - 1 x daily - 7 x weekly - 2 sets - 10 reps - 3 sec hold  Patient Education - Ice Massage   ASSESSMENT:  CLINICAL IMPRESSION: Angella reports increased R hip pain today although uncertain if it is related to the exercises or her increased activity over the 4th of July weekend.  We reviewed the HEP at her request, modifying the hip flexor stretch from modified Thomas position to sitting with better tolerance reported.  She was limited with  hip flexion strengthening due to pain and limited ROM, without significant change noted when motion tried without added resistance.  Palpation revealing significant tightness throughout R hip flexor muscle groups.  MT performed to right anterior hip muscles with palpable reduction in muscle tension and decreased pain reported, therefore provided instruction in options for self STM for  carryover at home.  Given increased R hip pain deferred instruction in stretches and strengthening to address R elbow to prevent recurrence of pain today, and will hopefully be able to address this next visit.  Vidya will benefit from continued skilled PT to address ongoing ROM, flexibility and strength deficits to improve mobility and activity tolerance with decreased pain interference.   EVAL:  HATLEY HENEGAR is a 63 y.o. female who was referred to physical therapy for evaluation and treatment for R hip and R elbow pain.  Patient reports onset of R hip pain beginning ~1+ year ago.  Pain is worse with prolonged standing or walking, climbing stairs, putting on socks, rolling over in bed and limits sleeping.  R elbow pain with more recent onset ~3-4 months ago and attributed to secondary to lateral epicondylitis.  Pain is worse with repetitive motion - folding laundry, working/playing on computer, lifting heavy items.  Patient has deficits in R hip ROM, proximal LE flexibility, R>L B LE, UE and grip strength, abnormal posture, and TTP with abnormal muscle tension which are interfering with ADLs and are impacting quality of life.  On LEFS patient scored 19/80 demonstrating moderate functional limitation.  On QuickDASH patient scored 43.2/100 demonstrating 43.2% disability.  Shenica will benefit from skilled PT to address above deficits to improve mobility and activity tolerance with decreased pain interference.  OBJECTIVE IMPAIRMENTS: Abnormal gait, decreased activity tolerance, decreased balance, decreased endurance, decreased knowledge of condition, decreased mobility, difficulty walking, decreased ROM, decreased strength, increased fascial restrictions, impaired perceived functional ability, increased muscle spasms, impaired flexibility, impaired UE functional use, improper body mechanics, postural dysfunction, and pain.   ACTIVITY LIMITATIONS: carrying, lifting, sitting, standing, squatting, sleeping, stairs,  transfers, bed mobility, dressing, and locomotion level  PARTICIPATION LIMITATIONS: meal prep, cleaning, laundry, driving, shopping, and community activity  PERSONAL FACTORS: Fitness, Past/current experiences, Time since onset of injury/illness/exacerbation, and 3+ comorbidities: Arthritis, HTN, s/p R CTR 2024, lumbar spondylolisthesis with radiculopathy, s/p L5-S1 MAS PLIF 2016, cervical spondylosis/DDD, cervical radiculopathy, dizziness, LE edema, RTC tendinopathy, obesity, SVT s/p ablation, h/o B knee pain are also affecting patient's functional outcome.   REHAB POTENTIAL: Good  CLINICAL DECISION MAKING: Unstable/unpredictable  EVALUATION COMPLEXITY: High   GOALS: Goals reviewed with patient? Yes  SHORT TERM GOALS: Target date: 11/01/2023  Patient will be independent with initial HEP. Baseline:  10/03/23 - initial LE HEP provided today Goal status: IN PROGRESS - 10/05/23 - HEP reviewed and modified to improve patient tolerance  2.  Patient will report at least 25% improvement in R hip and elbow pain to improve QOL. Baseline: R hip - 9/10 on eval, up to 10/10 at worst; R elbow - 3/10 on eval, up to 9/10 at worst Goal status: IN PROGRESS - 10/03/23 - no R elbow pain today  3.  Patient will improve R hip flexion AROM to >/= 90 and R hip ER AROM to >/= 15 to increase ease of car transfers. Baseline: Refer to above LE ROM table Goal status: INITIAL  LONG TERM GOALS: Target date: 12/13/2023   Patient will be independent with advanced/ongoing HEP to improve outcomes and carryover.  Baseline:  Goal status:  INITIAL  2.  Patient will report at least 50-75% improvement in R hip and elbow pain to improve QOL. Baseline: R hip - 9/10 on eval, up to 10/10 at worst; R elbow - 3/10 on eval, up to 9/10 at worst Goal status: INITIAL  3.  Patient will demonstrate functional pain free R hip ROM to perform ADLs.   Baseline: Refer to above LE ROM table Goal status: INITIAL  4.  Patient will  demonstrate improved B UE and scapular strength to >/= 4 to 4+/5 for functional UE use. Baseline: Refer to above UE MMT table Goal status: INITIAL  5.  Patient will demonstrate improved B LE strength to >/= 4 to 4+/5 for improved stability and ease of mobility. Baseline: Refer to above LE MMT table Goal status: INITIAL  6.  Patient will be able to ambulate with normal gait pattern without increased pain and be able to ascend/descend stairs with 1 HR and reciprocal step pattern safely to access home and community.  Baseline:  Goal status: INITIAL  7. Patient will report >/= 40/80 on LEFS to demonstrate improved functional ability. Baseline: 19 / 80 = 23.8 % Goal status: INITIAL  8.  Patient will report </= 29% on QuickDASH to demonstrate improved functional ability. Baseline: 43.2 / 100 = 43.2 % Goal status: INITIAL  9.  Patient will report <25% sleep disturbance due to pain. Baseline: Both R hip and elbow pain interfere with sleep Goal status: INITIAL    PLAN:  PT FREQUENCY: 2x/week  PT DURATION: 12 weeks  PLANNED INTERVENTIONS: [id number]- PT Re-evaluation, 97750- Physical Performance Testing, 97110-Therapeutic exercises, 97530- Therapeutic activity, [id number]- Neuromuscular re-education, 97535- Self Care, [id number]- Manual therapy, [phone]- Gait training, [phone]- Aquatic Therapy, [phone]- Electrical stimulation (unattended), [phone]- Ultrasound, [id number]- Ionotophoresis 4mg /ml Dexamethasone , [id number] (1-2 muscles), [id number] (3+ muscles)- Dry Needling, Patient/Family education, Balance training, Stair training, Taping, Joint mobilization, Spinal mobilization, DME instructions, Cryotherapy, and Moist heat  PLAN FOR NEXT SESSION: create initial HEP for UE flexibility and strengthening; progress proximal LE stretching and R hip ROM, lumbopelvic strengthening - update HEP as indicated; MT +/- TPDN and/or modalities to address abnormal muscle tension and pain - possible trial of ionto patch to R lateral  epicondyle/wrist extensor group and/or R greater trochanter   Elijah CHRISTELLA Hidden, PT 10/05/2023, 12:41 PM

## 2023-10-07 DIAGNOSIS — G4733 Obstructive sleep apnea (adult) (pediatric): Secondary | ICD-10-CM | POA: Diagnosis not present

## 2023-10-07 DIAGNOSIS — H524 Presbyopia: Secondary | ICD-10-CM | POA: Diagnosis not present

## 2023-10-07 DIAGNOSIS — H52223 Regular astigmatism, bilateral: Secondary | ICD-10-CM | POA: Diagnosis not present

## 2023-10-07 DIAGNOSIS — H40033 Anatomical narrow angle, bilateral: Secondary | ICD-10-CM | POA: Diagnosis not present

## 2023-10-07 DIAGNOSIS — H40013 Open angle with borderline findings, low risk, bilateral: Secondary | ICD-10-CM | POA: Diagnosis not present

## 2023-10-07 DIAGNOSIS — H2513 Age-related nuclear cataract, bilateral: Secondary | ICD-10-CM | POA: Diagnosis not present

## 2023-10-07 DIAGNOSIS — H5203 Hypermetropia, bilateral: Secondary | ICD-10-CM | POA: Diagnosis not present

## 2023-10-11 ENCOUNTER — Encounter: Payer: Self-pay | Admitting: Physical Therapy

## 2023-10-11 ENCOUNTER — Ambulatory Visit: Admitting: Physical Therapy

## 2023-10-11 DIAGNOSIS — M25551 Pain in right hip: Secondary | ICD-10-CM

## 2023-10-11 DIAGNOSIS — M6281 Muscle weakness (generalized): Secondary | ICD-10-CM

## 2023-10-11 DIAGNOSIS — M25521 Pain in right elbow: Secondary | ICD-10-CM

## 2023-10-11 DIAGNOSIS — R2689 Other abnormalities of gait and mobility: Secondary | ICD-10-CM

## 2023-10-11 NOTE — Therapy (Signed)
 OUTPATIENT PHYSICAL THERAPY TREATMENT   Patient Name: Sherri Sullivan MRN: [id number] DOB:05/04/1960, 63 y.o., female Today's Date: 10/11/2023  END OF SESSION:  PT End of Session - 10/11/23 1313     Visit Number 4    Date for PT Re-Evaluation 12/13/23    Authorization Type [employer]    Authorization Time Period Prior authorization not required    Progress Note Due on Visit 10    PT Start Time 1313    PT Stop Time 1351   Pt needing to leave early due to developing a tickle/cough that would not subside.   PT Time Calculation (min) 38 min    Activity Tolerance Patient tolerated treatment well    Behavior During Therapy WFL for tasks assessed/performed             Past Medical History:  Diagnosis Date   Anginal pain (HCC)    Arthritis    L shoulder- has had injections, degenerative changes in lumbar spine    Back pain    Chicken pox as a child   Constipation    Dizziness    Dysrhythmia 2013   SVT- treated by ablation by Dr. Waddell to f/u with as needed basis    Encounter for preventative adult health care exam with abnormal findings 02/09/2015   HTN (hypertension)    Hypokalemia 02/09/2015   Joint pain    Leg edema    Measles as a child   Measles as a child   Obesity    Panic attack    during episode of feeling to crowded    Preventative health care 02/09/2015   Seasonal allergies    SOB (shortness of breath) 09/23/2011   a little bit; at rest; before ablation, SOB again now (08/2014- due to lack  of exercise)    SVT (supraventricular tachycardia) (HCC)    Uterine fibroid 12/17/2012   Cervical polyp per patient Follows with [facility], Dr Tanda Mulch   Past Surgical History:  Procedure Laterality Date   ABDOMINAL HYSTERECTOMY  1990's   CARDIAC ELECTROPHYSIOLOGY STUDY AND ABLATION  09/23/11   MAXIMUM ACCESS (MAS)POSTERIOR LUMBAR INTERBODY FUSION (PLIF) 1 LEVEL N/A 09/24/2014   Procedure: L5-S1 MAS PLIF ;  Surgeon: Fairy Levels, MD;   Location: [facility];  Service: Neurosurgery;  Laterality: N/A;  L5-S1 MAS PLIF fusion   SUPRAVENTRICULAR TACHYCARDIA ABLATION N/A 09/23/2011   Procedure: SUPRAVENTRICULAR TACHYCARDIA ABLATION;  Surgeon: Danelle LELON Waddell, MD;  Location: [facility] CATH LAB;  Service: Cardiovascular;  Laterality: N/A;   TUBAL LIGATION  1980's   Patient Active Problem List   Diagnosis Date Noted   Severe sleep apnea 11/15/2022   Carpal tunnel syndrome 11/01/2022   Numbness of hand 11/01/2022   Cervical spondylosis 11/01/2022   Cervical radiculopathy 11/01/2022   Needs sleep apnea assessment 08/16/2022   H/O colonoscopy with polypectomy 08/16/2022   History of COVID-19 12/06/2021   Dyspnea 11/27/2020   DDD (degenerative disc disease), cervical 11/27/2020   Atypical chest pain 01/18/2019   Acute left-sided thoracic back pain 01/18/2019   Hyperglycemia 01/09/2018   Colon polyp 08/29/2017   Pain in right knee 06/28/2017   Pain in left knee 06/28/2017   Vitamin D  deficiency 02/25/2017   Polyneuropathy 01/21/2016   Hypokalemia 02/09/2015   Encounter for preventative adult health care exam with abnormal findings 02/09/2015   Preventative health care 02/09/2015   Spondylolisthesis of lumbar region 09/24/2014   Spinal stenosis 05/22/2014   Spondylolisthesis at L5-S1 level 05/22/2014  Lumbar radiculopathy 05/22/2014   Neck pain 04/08/2014   Muscle cramps 01/13/2014   Tendinopathy of rotator cuff 11/28/2013   Essential hypertension, benign 11/28/2013   Urinary frequency 08/17/2013   Uterine fibroid 12/17/2012   Obesity    SVT (supraventricular tachycardia) (HCC) 09/21/2011   LOW BACK PAIN 01/04/2008   Hyperlipidemia 02/28/2007   Allergic rhinitis 02/28/2007   KNEE PAIN, LEFT 02/28/2007    PCP: Domenica Harlene LABOR, MD   REFERRING PROVIDER: Joane Artist RAMAN, MD   REFERRING DIAG:  [id number] (ICD-10-CM) - Right elbow pain  M25.551 (ICD-10-CM) - Right hip pain   THERAPY DIAG:  Pain in right hip  Pain in right  elbow  Muscle weakness (generalized)  Other abnormalities of gait and mobility  RATIONALE FOR EVALUATION AND TREATMENT: Rehabilitation  ONSET DATE: R hip ~1 year; R elbow 3-4 months  NEXT MD VISIT: 10/25/2023   SUBJECTIVE:   SUBJECTIVE STATEMENT: Pt reports she is pretty good today - denies any pain.  EVAL:  Pt reports her R elbow has been getting better since she saw the MD - had been hurting for ~3 months prior to seeing MD.  She has been wearing elbow brace recommended by MD but not sure if she is positioning it properly as it continues to slide down her arm (does not have brace with her on eval).  Pain localized to R lateral epicondyle.  Still has difficulty lifting things with R hand.  R hip has been hurting closer to 1 year and continues to worsen.  Pain originates at lateral hip and radiates into R groin and anterior thigh, but denies numbness or tingling.  She has difficulty lifting her R foot in/out of a car.  PAIN: Are you having pain? Yes: NPRS scale: 0/10   Pain location: R lateral hip into groin and R anterior thigh  Pain description: stabbing  Aggravating factors: prolonged standing or walking, climbing stairs, putting on socks, rolling over in bed  Relieving factors: nothing so far (tried heat and ice), maybe rubbing motion when applying Aspercreme   Are you having pain? No - lateral epicondyle of R elbow  PERTINENT HISTORY: Arthritis, HTN, s/p R CTR 2024, lumbar spondylolisthesis with radiculopathy, s/p L5-S1 MAS PLIF 2016, cervical spondylosis/DDD, cervical radiculopathy, dizziness, LE edema, RTC tendinopathy, obesity, SVT s/p ablation, h/o B knee pain  PRECAUTIONS: None  HAND DOMINANCE: Right  RED FLAGS: None  WEIGHT BEARING RESTRICTIONS: No  FALLS:  Has patient fallen in last 6 months? No  LIVING ENVIRONMENT: Lives with: lives with their spouse Lives in: House/apartment Stairs: Yes: Internal: 14 x 2 steps; on right going up and External: 3+4 steps;  none Has following equipment at home: None  OCCUPATION: Retired  PLOF: Independent and Leisure: binge watching Netflix, light gardening   PATIENT GOALS: Better mobility with less pain.   OBJECTIVE: (objective measures completed at initial evaluation unless otherwise dated)  DIAGNOSTIC FINDINGS:  08/30/23 - Diagnostic Limited MSK Ultrasound of: Right lateral elbow Intact common extensor tendon origin at lateral epicondyle.  Small avulsion fleck present at superficial portion of lateral epicondyle. Increased vascular activity on Doppler consistent with neovascularity. No visible tears present in the tendon. Impression: Lateral epicondylitis  08/30/23 - DG hip IMPRESSION: Moderate to severe right hip degenerative change.  PATIENT SURVEYS:  Quick Dash: 43.2 / 100 = 43.2 % LEFS: 19 / 80 = 23.8 %, moderate functional limitation  COGNITION: Overall cognitive status: Within functional limits for tasks assessed     SENSATION: [facility]  EDEMA:  N/A  POSTURE:  rounded shoulders, forward head, flexed trunk , and weight shift left  PALPATION: Increased muscle tension with TTP over R glutes and piriformis.  Mildly TTP over R greater trochanter.  TTP over R wrist extensor group and lateral epicondyle.  UPPER EXTREMITY ROM:  Grossly WFL  UPPER EXTREMITY MMT:  MMT Right eval Left eval  Shoulder flexion 4- 4  Shoulder extension 4 4  Shoulder abduction 4- 4+  Shoulder adduction    Shoulder internal rotation 4+ 4+  Shoulder external rotation 4- 4  Middle trapezius 3+ 4-  Lower trapezius 3- 3-  Elbow flexion 4- 4+  Elbow extension 4- 4-  Wrist flexion    Wrist extension    Wrist ulnar deviation    Wrist radial deviation    Wrist pronation    Wrist supination    Grip strength (lbs) 13.67# 26.33#  (Blank rows = not tested)  MUSCLE LENGTH: Hamstrings: mildly tight L>R ITB: mod tight B Piriformis: severe tight R>L Hip flexors: mod tight B Quads: mild tight B Heelcord:    LOWER EXTREMITY ROM:  Active ROM Right eval Left eval  Hip flexion 74 91  Hip extension    Hip abduction    Hip adduction    Hip internal rotation 39 44  Hip external rotation 7 27  Knee flexion    Knee extension    Ankle dorsiflexion    Ankle plantarflexion    Ankle inversion    Ankle eversion     (Blank rows = not tested)  LOWER EXTREMITY MMT:  MMT Right eval Left eval  Hip flexion 3+ p! 4-  Hip extension 3- 3-  Hip abduction 3- 3+  Hip adduction 2+ 3-  Hip internal rotation 4 4+  Hip external rotation 2 4-  Knee flexion 4- 4  Knee extension 4- 4  Ankle dorsiflexion 4- 4  Ankle plantarflexion    Ankle inversion    Ankle eversion     (Blank rows = not tested)  LOWER EXTREMITY SPECIAL TESTS:  Hip special tests: Belvie (FABER) test: positive , Thomas test: positive , and Ober's test: negative  FUNCTIONAL TESTS:  5 times sit to stand: 27.40 sec  TRANSFERS: Assistive device utilized: None  Sit to stand: Complete Independence and Modified independence -typically relies on UE assist but able to complete sit to stand without UE assist although tendency for weight shift to L with R genu valgum Stand to sit: Complete Independence and Modified independence Chair to chair: Modified independence Floor: NT   TODAY'S TREATMENT:  10/11/2023  THERAPEUTIC EXERCISE: To improve strength, endurance, ROM, and flexibility.  Demonstration, verbal and tactile cues throughout for technique.  NuStep - L4 x 6 min (UE/LE to promote muscle warm-up and tissue perfusion) Seated R wrist extensor stretch x 30 Seated R wrist flexor stretch x 30 Seated R forearm pronation/supination 2# 10 x 5 Seated R eccentric wrist extension 2# x 10 Seated R eccentric wrist flexion 2# x 10 Yellow Thera-Putty gross grip and twist grip Seated B wrist flexion/extension roll-up with RTB attached to PVC pipe   10/05/2023  THERAPEUTIC EXERCISE: To improve strength, endurance, ROM, and flexibility.   Demonstration, verbal and tactile cues throughout for technique.  Rec Bike - L1 x 6 min Seated lunge position R hip flexor stretch 3 x 30 Seated R hip hinge HS stretch with foot on floor 2 x 30 Seated R figure-4 piriformis stretch with foot positioned on 9 stool 2 x 30 Standing  R glute stretch with foot on wooden box 2 x 30 Seated B hip ADD ball squeeze isometric 10 x 5, 2 sets Seated RTB B hip ABD/ER clam 10 x 3-5, 2 sets Seated RTB hip flexion march x 10, cues not to lean back when lifting R LE  MANUAL THERAPY: To promote normalized muscle tension, improved flexibility, improved joint mobility, increased ROM, and reduced pain utilizing connective tissue massage, therapeutic massage, and manual TP therapy. STM/DTM, manual TPR, and IASTM with foam roller to R iliopsoas, TFL and RF  SELF CARE: Provided education to prevent loss of gains achieved with physical therapy.  Provided instruction in self-STM techniques to R hip flexors using tennis ball, foam roller or rolling pin.    10/03/2023 THERAPEUTIC EXERCISE: To improve strength, endurance, ROM, and flexibility.  Demonstration, verbal and tactile cues throughout for technique.  NuStep - L4 x 6 min (UE/LE to promote muscle warm-up and tissue perfusion) Supine R HS stretch with strap x 30 Hooklying R HS stretch with strap x 30 - preferred  Supine R ITB stretch with strap x 30 - painful Unable to achieve R SKTC or R bent knee glute stretch position in supine  Unable to achieve R figure-4 or KTOS piriformis stretch position in hooklying  Mod thomas R quad/hip flexor stretch with strap 2 x 30 Unable to achieve R side-sitting figure-4/pigeon piriformis stretch position in sitting Seated R figure-4 piriformis stretch with foot positioned on 9 stool 2 x 30 Standing R glute stretch with foot on wooden box 2 x 30 Seated B hip ADD ball squeeze isometric 10 x 5, 2 sets Seated RTB B hip ABD/ER clam 10 x 3-5, 2 sets Seated RTB hip  flexion march x 10, cues not to lean back when lifting R LE   09/20/2023 - Eval SELF CARE:  Reviewed eval findings and role of PT in addressing identified deficits as well as instruction in ice massage for R lateral epicondylitis (see below).    PATIENT EDUCATION:  Education details: HEP update - UE stretches and strengthening  Person educated: Patient Education method: Explanation, Demonstration, Verbal cues, Tactile cues, and MedBridgeGO app updated Education comprehension: verbalized understanding, returned demonstration, verbal cues required, tactile cues required, and needs further education  HOME EXERCISE PROGRAM: *Pt using MedBridgeGO app  Access Code: [email or web address] URL: [email or web address] Date: 10/11/2023 Prepared by: Elijah Hidden  Exercises - Seated Hip Flexor Stretch  - 2-3 x daily - 7 x weekly - 3 reps - 30 sec hold - Seated Hamstring Stretch  - 2-3 x daily - 7 x weekly - 3 reps - 30 sec hold - Seated Piriformis Stretch  - 2-3 x daily - 7 x weekly - 3 reps - 30 sec hold - Standing Gluteal Stretch on Chair  - 2-3 x daily - 7 x weekly - 3 reps - 30 sec hold - Standing ITB Stretch (Mirrored)  - 2-3 x daily - 7 x weekly - 3 reps - 30 sec hold - Seated Hip Adduction Squeeze with Ball  - 1 x daily - 7 x weekly - 2 sets - 10 reps - 3-5 sec hold - Seated Hip Abduction with Resistance  - 1 x daily - 7 x weekly - 2 sets - 10 reps - 3 sec hold - Seated March with Resistance  - 1 x daily - 7 x weekly - 2 sets - 10 reps - 3 sec hold - Seated Wrist Extension Stretch  - 2 x daily - 7 x weekly -  3 reps - 30 sec hold - Seated Wrist Flexion Stretch  - 2 x daily - 7 x weekly - 3 reps - 30 sec hold - Putty Squeezes  - 1 x daily - 7 x weekly - 2 sets - 10 reps - 3 sec hold - Seated Eccentric Wrist Flexion with Dumbbell  - 1 x daily - 3 x weekly - 2 sets - 10 reps - 3 sec hold - Seated Eccentric Wrist Extension  - 1 x daily - 3 x weekly - 2 sets - 10 reps - 3 sec hold - Seated  Wrist Supination Pronation with Can  - 1 x daily - 3 x weekly - 2 sets - 10 reps - 3 sec hold - Wrist Flexion and Extension with Resistance Bar  - 1 x daily - 3 x weekly - 2 sets - 10 reps - 3 sec hold  Patient Education - Ice Massage   ASSESSMENT:  CLINICAL IMPRESSION: Sherri Sullivan denies pain today.  LE HEP going well with no need for further review stated today, therefore provided initial instruction in UE stretches as well as grip and forearm strengthening today.  Pt able to provide good return demonstration therefore instructions provided for HEP.  Session ended early due to patient developing a tickle/cough that would not subside.  Sherri Sullivan will benefit from continued skilled PT to address ongoing ROM, flexibility and strength deficits to improve mobility and activity tolerance with decreased pain interference.   EVAL:  Sherri Sullivan is a 63 y.o. female who was referred to physical therapy for evaluation and treatment for R hip and R elbow pain.  Patient reports onset of R hip pain beginning ~1+ year ago.  Pain is worse with prolonged standing or walking, climbing stairs, putting on socks, rolling over in bed and limits sleeping.  R elbow pain with more recent onset ~3-4 months ago and attributed to secondary to lateral epicondylitis.  Pain is worse with repetitive motion - folding laundry, working/playing on computer, lifting heavy items.  Patient has deficits in R hip ROM, proximal LE flexibility, R>L B LE, UE and grip strength, abnormal posture, and TTP with abnormal muscle tension which are interfering with ADLs and are impacting quality of life.  On LEFS patient scored 19/80 demonstrating moderate functional limitation.  On QuickDASH patient scored 43.2/100 demonstrating 43.2% disability.  Sherri Sullivan will benefit from skilled PT to address above deficits to improve mobility and activity tolerance with decreased pain interference.  OBJECTIVE IMPAIRMENTS: Abnormal gait, decreased activity tolerance, decreased  balance, decreased endurance, decreased knowledge of condition, decreased mobility, difficulty walking, decreased ROM, decreased strength, increased fascial restrictions, impaired perceived functional ability, increased muscle spasms, impaired flexibility, impaired UE functional use, improper body mechanics, postural dysfunction, and pain.   ACTIVITY LIMITATIONS: carrying, lifting, sitting, standing, squatting, sleeping, stairs, transfers, bed mobility, dressing, and locomotion level  PARTICIPATION LIMITATIONS: meal prep, cleaning, laundry, driving, shopping, and community activity  PERSONAL FACTORS: Fitness, Past/current experiences, Time since onset of injury/illness/exacerbation, and 3+ comorbidities: Arthritis, HTN, s/p R CTR 2024, lumbar spondylolisthesis with radiculopathy, s/p L5-S1 MAS PLIF 2016, cervical spondylosis/DDD, cervical radiculopathy, dizziness, LE edema, RTC tendinopathy, obesity, SVT s/p ablation, h/o B knee pain are also affecting patient's functional outcome.   REHAB POTENTIAL: Good  CLINICAL DECISION MAKING: Unstable/unpredictable  EVALUATION COMPLEXITY: High   GOALS: Goals reviewed with patient? Yes  SHORT TERM GOALS: Target date: 11/01/2023  Patient will be independent with initial HEP. Baseline:  10/03/23 - initial LE HEP provided today 10/05/23 -  HEP reviewed and modified to improve patient tolerance Goal status: IN PROGRESS - 10/11/23 - Introduced UE HEP  2.  Patient will report at least 25% improvement in R hip and elbow pain to improve QOL. Baseline: R hip - 9/10 on eval, up to 10/10 at worst; R elbow - 3/10 on eval, up to 9/10 at worst Goal status: IN PROGRESS - 10/03/23 - no R elbow pain today  3.  Patient will improve R hip flexion AROM to >/= 90 and R hip ER AROM to >/= 15 to increase ease of car transfers. Baseline: Refer to above LE ROM table Goal status: INITIAL  LONG TERM GOALS: Target date: 12/13/2023   Patient will be independent with  advanced/ongoing HEP to improve outcomes and carryover.  Baseline:  Goal status: INITIAL  2.  Patient will report at least 50-75% improvement in R hip and elbow pain to improve QOL. Baseline: R hip - 9/10 on eval, up to 10/10 at worst; R elbow - 3/10 on eval, up to 9/10 at worst Goal status: INITIAL  3.  Patient will demonstrate functional pain free R hip ROM to perform ADLs.   Baseline: Refer to above LE ROM table Goal status: INITIAL  4.  Patient will demonstrate improved B UE and scapular strength to >/= 4 to 4+/5 for functional UE use. Baseline: Refer to above UE MMT table Goal status: INITIAL  5.  Patient will demonstrate improved B LE strength to >/= 4 to 4+/5 for improved stability and ease of mobility. Baseline: Refer to above LE MMT table Goal status: INITIAL  6.  Patient will be able to ambulate with normal gait pattern without increased pain and be able to ascend/descend stairs with 1 HR and reciprocal step pattern safely to access home and community.  Baseline:  Goal status: INITIAL  7. Patient will report >/= 40/80 on LEFS to demonstrate improved functional ability. Baseline: 19 / 80 = 23.8 % Goal status: INITIAL  8.  Patient will report </= 29% on QuickDASH to demonstrate improved functional ability. Baseline: 43.2 / 100 = 43.2 % Goal status: INITIAL  9.  Patient will report <25% sleep disturbance due to pain. Baseline: Both R hip and elbow pain interfere with sleep Goal status: INITIAL    PLAN:  PT FREQUENCY: 2x/week  PT DURATION: 12 weeks  PLANNED INTERVENTIONS: 02835- PT Re-evaluation, 97750- Physical Performance Testing, 97110-Therapeutic exercises, 97530- Therapeutic activity, [id number]- Neuromuscular re-education, 97535- Self Care, 02859- Manual therapy, [phone]- Gait training, [phone]- Aquatic Therapy, [phone]- Electrical stimulation (unattended), [phone]- Ultrasound, [id number]- Ionotophoresis 4mg /ml Dexamethasone , 79439 (1-2 muscles), [id number] (3+ muscles)- Dry Needling,  Patient/Family education, Balance training, Stair training, Taping, Joint mobilization, Spinal mobilization, DME instructions, Cryotherapy, and Moist heat  PLAN FOR NEXT SESSION: review initial HEP for UE flexibility and strengthening; progress proximal LE stretching and R hip ROM, lumbopelvic strengthening - update HEP as indicated; MT +/- TPDN and/or modalities to address abnormal muscle tension and pain - possible trial of ionto patch to R lateral epicondyle/wrist extensor group and/or R greater trochanter   Elijah CHRISTELLA Hidden, PT 10/11/2023, 1:53 PM

## 2023-10-12 DIAGNOSIS — J4540 Moderate persistent asthma, uncomplicated: Secondary | ICD-10-CM | POA: Insufficient documentation

## 2023-10-12 DIAGNOSIS — J310 Chronic rhinitis: Secondary | ICD-10-CM | POA: Diagnosis not present

## 2023-10-12 DIAGNOSIS — G4733 Obstructive sleep apnea (adult) (pediatric): Secondary | ICD-10-CM | POA: Diagnosis not present

## 2023-10-12 DIAGNOSIS — Z6841 Body Mass Index (BMI) 40.0 and over, adult: Secondary | ICD-10-CM | POA: Diagnosis not present

## 2023-10-12 DIAGNOSIS — Z87891 Personal history of nicotine dependence: Secondary | ICD-10-CM | POA: Diagnosis not present

## 2023-10-13 ENCOUNTER — Encounter

## 2023-10-17 ENCOUNTER — Other Ambulatory Visit: Payer: Self-pay | Admitting: Cardiology

## 2023-10-18 ENCOUNTER — Ambulatory Visit: Admitting: Physical Therapy

## 2023-10-18 ENCOUNTER — Encounter: Payer: Self-pay | Admitting: Physical Therapy

## 2023-10-18 DIAGNOSIS — M25521 Pain in right elbow: Secondary | ICD-10-CM

## 2023-10-18 DIAGNOSIS — M25551 Pain in right hip: Secondary | ICD-10-CM | POA: Diagnosis not present

## 2023-10-18 DIAGNOSIS — R2689 Other abnormalities of gait and mobility: Secondary | ICD-10-CM

## 2023-10-18 DIAGNOSIS — M6281 Muscle weakness (generalized): Secondary | ICD-10-CM

## 2023-10-18 NOTE — Therapy (Signed)
 OUTPATIENT PHYSICAL THERAPY TREATMENT   Patient Name: Sherri Sullivan MRN: [id number] DOB:1960/09/24, 63 y.o., female Today's Date: 10/18/2023  END OF SESSION:  PT End of Session - 10/18/23 1315     Visit Number 5    Date for PT Re-Evaluation 12/13/23    Authorization Type [employer]    Authorization Time Period Prior authorization not required    Progress Note Due on Visit 10    PT Start Time 1315    PT Stop Time 1355    PT Time Calculation (min) 40 min    Activity Tolerance Patient tolerated treatment well    Behavior During Therapy WFL for tasks assessed/performed             Past Medical History:  Diagnosis Date   Anginal pain (HCC)    Arthritis    L shoulder- has had injections, degenerative changes in lumbar spine    Back pain    Chicken pox as a child   Constipation    Dizziness    Dysrhythmia 2013   SVT- treated by ablation by Dr. Waddell to f/u with as needed basis    Encounter for preventative adult health care exam with abnormal findings 02/09/2015   HTN (hypertension)    Hypokalemia 02/09/2015   Joint pain    Leg edema    Measles as a child   Measles as a child   Obesity    Panic attack    during episode of feeling to crowded    Preventative health care 02/09/2015   Seasonal allergies    SOB (shortness of breath) 09/23/2011   a little bit; at rest; before ablation, SOB again now (08/2014- due to lack  of exercise)    SVT (supraventricular tachycardia) (HCC)    Uterine fibroid 12/17/2012   Cervical polyp per patient Follows with [facility], Dr Tanda Mulch   Past Surgical History:  Procedure Laterality Date   ABDOMINAL HYSTERECTOMY  1990's   CARDIAC ELECTROPHYSIOLOGY STUDY AND ABLATION  09/23/11   MAXIMUM ACCESS (MAS)POSTERIOR LUMBAR INTERBODY FUSION (PLIF) 1 LEVEL N/A 09/24/2014   Procedure: L5-S1 MAS PLIF ;  Surgeon: Fairy Levels, MD;  Location: [facility];  Service: Neurosurgery;  Laterality: N/A;  L5-S1 MAS PLIF fusion    SUPRAVENTRICULAR TACHYCARDIA ABLATION N/A 09/23/2011   Procedure: SUPRAVENTRICULAR TACHYCARDIA ABLATION;  Surgeon: Danelle LELON Waddell, MD;  Location: [facility] CATH LAB;  Service: Cardiovascular;  Laterality: N/A;   TUBAL LIGATION  1980's   Patient Active Problem List   Diagnosis Date Noted   Severe sleep apnea 11/15/2022   Carpal tunnel syndrome 11/01/2022   Numbness of hand 11/01/2022   Cervical spondylosis 11/01/2022   Cervical radiculopathy 11/01/2022   Needs sleep apnea assessment 08/16/2022   H/O colonoscopy with polypectomy 08/16/2022   History of COVID-19 12/06/2021   Dyspnea 11/27/2020   DDD (degenerative disc disease), cervical 11/27/2020   Atypical chest pain 01/18/2019   Acute left-sided thoracic back pain 01/18/2019   Hyperglycemia 01/09/2018   Colon polyp 08/29/2017   Pain in right knee 06/28/2017   Pain in left knee 06/28/2017   Vitamin D  deficiency 02/25/2017   Polyneuropathy 01/21/2016   Hypokalemia 02/09/2015   Encounter for preventative adult health care exam with abnormal findings 02/09/2015   Preventative health care 02/09/2015   Spondylolisthesis of lumbar region 09/24/2014   Spinal stenosis 05/22/2014   Spondylolisthesis at L5-S1 level 05/22/2014   Lumbar radiculopathy 05/22/2014   Neck pain 04/08/2014   Muscle cramps 01/13/2014  Tendinopathy of rotator cuff 11/28/2013   Essential hypertension, benign 11/28/2013   Urinary frequency 08/17/2013   Uterine fibroid 12/17/2012   Obesity    SVT (supraventricular tachycardia) (HCC) 09/21/2011   LOW BACK PAIN 01/04/2008   Hyperlipidemia 02/28/2007   Allergic rhinitis 02/28/2007   KNEE PAIN, LEFT 02/28/2007    PCP: Domenica Harlene LABOR, MD   REFERRING PROVIDER: Joane Artist RAMAN, MD   REFERRING DIAG:  [phone] (ICD-10-CM) - Right elbow pain  M25.551 (ICD-10-CM) - Right hip pain   THERAPY DIAG:  Pain in right hip  Pain in right elbow  Muscle weakness (generalized)  Other abnormalities of gait and  mobility  RATIONALE FOR EVALUATION AND TREATMENT: Rehabilitation  ONSET DATE: R hip ~1 year; R elbow 3-4 months  NEXT MD VISIT: 10/25/2023   SUBJECTIVE:   SUBJECTIVE STATEMENT: Pt reports she has just been diagnosed with asthma and started on new meds and inhalers to treat this - med list updated.  EVAL:  Pt reports her R elbow has been getting better since she saw the MD - had been hurting for ~3 months prior to seeing MD.  She has been wearing elbow brace recommended by MD but not sure if she is positioning it properly as it continues to slide down her arm (does not have brace with her on eval).  Pain localized to R lateral epicondyle.  Still has difficulty lifting things with R hand.  R hip has been hurting closer to 1 year and continues to worsen.  Pain originates at lateral hip and radiates into R groin and anterior thigh, but denies numbness or tingling.  She has difficulty lifting her R foot in/out of a car.  PAIN: Are you having pain? Yes: NPRS scale: 6/10   Pain location: R lateral hip into groin and R anterior thigh  Pain description: stabbing  Aggravating factors: prolonged standing or walking, climbing stairs, putting on socks, rolling over in bed  Relieving factors: nothing so far (tried heat and ice), maybe rubbing motion when applying Aspercreme   Are you having pain? No - lateral epicondyle of R elbow  PERTINENT HISTORY: Arthritis, HTN, s/p R CTR 2024, lumbar spondylolisthesis with radiculopathy, s/p L5-S1 MAS PLIF 2016, cervical spondylosis/DDD, cervical radiculopathy, dizziness, LE edema, RTC tendinopathy, obesity, SVT s/p ablation, h/o B knee pain  PRECAUTIONS: None  HAND DOMINANCE: Right  RED FLAGS: None  WEIGHT BEARING RESTRICTIONS: No  FALLS:  Has patient fallen in last 6 months? No  LIVING ENVIRONMENT: Lives with: lives with their spouse Lives in: House/apartment Stairs: Yes: Internal: 14 x 2 steps; on right going up and External: 3+4 steps; none Has  following equipment at home: None  OCCUPATION: Retired  PLOF: Independent and Leisure: binge watching Netflix, light gardening   PATIENT GOALS: Better mobility with less pain.   OBJECTIVE: (objective measures completed at initial evaluation unless otherwise dated)  DIAGNOSTIC FINDINGS:  08/30/23 - Diagnostic Limited MSK Ultrasound of: Right lateral elbow Intact common extensor tendon origin at lateral epicondyle.  Small avulsion fleck present at superficial portion of lateral epicondyle. Increased vascular activity on Doppler consistent with neovascularity. No visible tears present in the tendon. Impression: Lateral epicondylitis  08/30/23 - DG hip IMPRESSION: Moderate to severe right hip degenerative change.  PATIENT SURVEYS:  Quick Dash: 43.2 / 100 = 43.2 % LEFS: 19 / 80 = 23.8 %, moderate functional limitation  COGNITION: Overall cognitive status: Within functional limits for tasks assessed     SENSATION: WFL  EDEMA:  N/A  POSTURE:  rounded shoulders, forward head, flexed trunk , and weight shift left  PALPATION: Increased muscle tension with TTP over R glutes and piriformis.  Mildly TTP over R greater trochanter.  TTP over R wrist extensor group and lateral epicondyle.  UPPER EXTREMITY ROM:  Grossly WFL  UPPER EXTREMITY MMT:  MMT Right eval Left eval  Shoulder flexion 4- 4  Shoulder extension 4 4  Shoulder abduction 4- 4+  Shoulder adduction    Shoulder internal rotation 4+ 4+  Shoulder external rotation 4- 4  Middle trapezius 3+ 4-  Lower trapezius 3- 3-  Elbow flexion 4- 4+  Elbow extension 4- 4-  Wrist flexion    Wrist extension    Wrist ulnar deviation    Wrist radial deviation    Wrist pronation    Wrist supination    Grip strength (lbs) 13.67# 26.33#  (Blank rows = not tested)  MUSCLE LENGTH: Hamstrings: mildly tight L>R ITB: mod tight B Piriformis: severe tight R>L Hip flexors: mod tight B Quads: mild tight B Heelcord:   LOWER  EXTREMITY ROM:  Active ROM Right eval Left eval  Hip flexion 74 91  Hip extension    Hip abduction    Hip adduction    Hip internal rotation 39 44  Hip external rotation 7 27  Knee flexion    Knee extension    Ankle dorsiflexion    Ankle plantarflexion    Ankle inversion    Ankle eversion     (Blank rows = not tested)  LOWER EXTREMITY MMT:  MMT Right eval Left eval  Hip flexion 3+ p! 4-  Hip extension 3- 3-  Hip abduction 3- 3+  Hip adduction 2+ 3-  Hip internal rotation 4 4+  Hip external rotation 2 4-  Knee flexion 4- 4  Knee extension 4- 4  Ankle dorsiflexion 4- 4  Ankle plantarflexion    Ankle inversion    Ankle eversion     (Blank rows = not tested)  LOWER EXTREMITY SPECIAL TESTS:  Hip special tests: Belvie (FABER) test: positive , Thomas test: positive , and Ober's test: negative  FUNCTIONAL TESTS:  5 times sit to stand: 27.40 sec  TRANSFERS: Assistive device utilized: None  Sit to stand: Complete Independence and Modified independence -typically relies on UE assist but able to complete sit to stand without UE assist although tendency for weight shift to L with R genu valgum Stand to sit: Complete Independence and Modified independence Chair to chair: Modified independence Floor: NT   TODAY'S TREATMENT:  10/18/2023  THERAPEUTIC EXERCISE: To improve strength, ROM, and flexibility.  Demonstration, verbal and tactile cues throughout for technique. Rec Bike - L1 x 6 min Seated R figure-4 piriformis stretch with foot positioned on wooden box x 30 (had to elevated treatment table, therefore may need to continue with 9 stool for a while more) Standing hip ABD 3 x 10 bil, UE support on wall ladder for balance Seated hip ADD isometric ball squeeze 10 x 3, 3 sets  NEUROMUSCULAR RE-EDUCATION: To improve balance, coordination, and reduce fall risk.  Hooklying TrA alt unilateral bent-knee fallout 2 x 10 Bridge + B hip ABD clam x 10 Seated alt hip flexion march  with arms outstretched 2 x 60, 2nd set with 3# ankle weights  L unilateral heel raises x 8 with UE support on wall, unable on R  THERAPEUTIC ACTIVITIES: To improve functional performance.  Demonstration, verbal and tactile cues throughout for technique.  Sit <> stand from  chair 2 x 10 - intermittently with UE assist from armrests, although some reps with arms crossed on chest - easier to complete sit to stand rise with more difficulty noted on eccentric lowering   10/11/2023  THERAPEUTIC EXERCISE: To improve strength, endurance, ROM, and flexibility.  Demonstration, verbal and tactile cues throughout for technique.  NuStep - L4 x 6 min (UE/LE to promote muscle warm-up and tissue perfusion) Seated R wrist extensor stretch x 30 Seated R wrist flexor stretch x 30 Seated R forearm pronation/supination 2# 10 x 5 Seated R eccentric wrist extension 2# x 10 Seated R eccentric wrist flexion 2# x 10 Yellow Thera-Putty gross grip and twist grip Seated B wrist flexion/extension roll-up with RTB attached to PVC pipe   10/05/2023  THERAPEUTIC EXERCISE: To improve strength, endurance, ROM, and flexibility.  Demonstration, verbal and tactile cues throughout for technique.  Rec Bike - L1 x 6 min Seated lunge position R hip flexor stretch 3 x 30 Seated R hip hinge HS stretch with foot on floor 2 x 30 Seated R figure-4 piriformis stretch with foot positioned on 9 stool 2 x 30 Standing R glute stretch with foot on wooden box 2 x 30 Seated B hip ADD ball squeeze isometric 10 x 5, 2 sets Seated RTB B hip ABD/ER clam 10 x 3-5, 2 sets Seated RTB hip flexion march x 10, cues not to lean back when lifting R LE  MANUAL THERAPY: To promote normalized muscle tension, improved flexibility, improved joint mobility, increased ROM, and reduced pain utilizing connective tissue massage, therapeutic massage, and manual TP therapy. STM/DTM, manual TPR, and IASTM with foam roller to R iliopsoas, TFL and RF  SELF  CARE: Provided education to prevent loss of gains achieved with physical therapy.  Provided instruction in self-STM techniques to R hip flexors using tennis ball, foam roller or rolling pin.    10/03/2023 THERAPEUTIC EXERCISE: To improve strength, endurance, ROM, and flexibility.  Demonstration, verbal and tactile cues throughout for technique.  NuStep - L4 x 6 min (UE/LE to promote muscle warm-up and tissue perfusion) Supine R HS stretch with strap x 30 Hooklying R HS stretch with strap x 30 - preferred  Supine R ITB stretch with strap x 30 - painful Unable to achieve R SKTC or R bent knee glute stretch position in supine  Unable to achieve R figure-4 or KTOS piriformis stretch position in hooklying  Mod thomas R quad/hip flexor stretch with strap 2 x 30 Unable to achieve R side-sitting figure-4/pigeon piriformis stretch position in sitting Seated R figure-4 piriformis stretch with foot positioned on 9 stool 2 x 30 Standing R glute stretch with foot on wooden box 2 x 30 Seated B hip ADD ball squeeze isometric 10 x 5, 2 sets Seated RTB B hip ABD/ER clam 10 x 3-5, 2 sets Seated RTB hip flexion march x 10, cues not to lean back when lifting R LE   09/20/2023 - Eval SELF CARE:  Reviewed eval findings and role of PT in addressing identified deficits as well as instruction in ice massage for R lateral epicondylitis (see below).    PATIENT EDUCATION:  Education details: HEP update - Continue UE stretches and strengthening  Person educated: Patient Education method: Explanation, Demonstration, Verbal cues, Tactile cues, and MedBridgeGO app updated Education comprehension: verbalized understanding, returned demonstration, verbal cues required, tactile cues required, and needs further education  HOME EXERCISE PROGRAM: *Pt using MedBridgeGO app  Access Code: [email or web address] URL: [email or web address] Date: 10/11/2023 Prepared by: JoAnne  Kreis  Exercises - Seated Hip Flexor  Stretch  - 2-3 x daily - 7 x weekly - 3 reps - 30 sec hold - Seated Hamstring Stretch  - 2-3 x daily - 7 x weekly - 3 reps - 30 sec hold - Seated Piriformis Stretch  - 2-3 x daily - 7 x weekly - 3 reps - 30 sec hold - Standing Gluteal Stretch on Chair  - 2-3 x daily - 7 x weekly - 3 reps - 30 sec hold - Standing ITB Stretch (Mirrored)  - 2-3 x daily - 7 x weekly - 3 reps - 30 sec hold - Seated Hip Adduction Squeeze with Ball  - 1 x daily - 7 x weekly - 2 sets - 10 reps - 3-5 sec hold - Seated Hip Abduction with Resistance  - 1 x daily - 7 x weekly - 2 sets - 10 reps - 3 sec hold - Seated March with Resistance  - 1 x daily - 7 x weekly - 2 sets - 10 reps - 3 sec hold - Seated Wrist Extension Stretch  - 2 x daily - 7 x weekly - 3 reps - 30 sec hold - Seated Wrist Flexion Stretch  - 2 x daily - 7 x weekly - 3 reps - 30 sec hold - Putty Squeezes  - 1 x daily - 7 x weekly - 2 sets - 10 reps - 3 sec hold - Seated Eccentric Wrist Flexion with Dumbbell  - 1 x daily - 3 x weekly - 2 sets - 10 reps - 3 sec hold - Seated Eccentric Wrist Extension  - 1 x daily - 3 x weekly - 2 sets - 10 reps - 3 sec hold - Seated Wrist Supination Pronation with Can  - 1 x daily - 3 x weekly - 2 sets - 10 reps - 3 sec hold - Wrist Flexion and Extension with Resistance Bar  - 1 x daily - 3 x weekly - 2 sets - 10 reps - 3 sec hold  Patient Education - Ice Massage   ASSESSMENT:  CLINICAL IMPRESSION: Sherri Sullivan states that today she has some R hip pain and no pain in the elbow. Pt 's session today included exercises to strengthen her core, hip muscles, and lower extremities. Pt was able to perform all activities today w/ no reports of inc'd pain. Sherri Sullivan had some difficulty completing bridges w/ hip abduction as she struggled to abduct the L hip while in bridge position and had inc'd ER on the R hip. Additionally, pt had difficulty performing STS today d/t dec'd eccentric control and strength causing her to have quick and harsh descent  into the chair, especially when trying to perform the STS w/o UE support. Pt has not been able to do her HEP much over this week d/t making adjustments for her asthma. Pt plans to continue her current HEP and provide an update at her next visit. Pt's exercises today were paced slowly today to reduce cough/breathlessness. Pt reports that her hip feels better after today's session. Sherri Sullivan will benefit from continued skilled PT to address her deficits in ROM, strength, and flexibility to improve her mobility and activity tolerance w/ dec'd pain interference.   EVAL:  Sherri Sullivan is a 63 y.o. female who was referred to physical therapy for evaluation and treatment for R hip and R elbow pain.  Patient reports onset of R hip pain beginning ~1+ year ago.  Pain is worse with prolonged standing  or walking, climbing stairs, putting on socks, rolling over in bed and limits sleeping.  R elbow pain with more recent onset ~3-4 months ago and attributed to secondary to lateral epicondylitis.  Pain is worse with repetitive motion - folding laundry, working/playing on computer, lifting heavy items.  Patient has deficits in R hip ROM, proximal LE flexibility, R>L B LE, UE and grip strength, abnormal posture, and TTP with abnormal muscle tension which are interfering with ADLs and are impacting quality of life.  On LEFS patient scored 19/80 demonstrating moderate functional limitation.  On QuickDASH patient scored 43.2/100 demonstrating 43.2% disability.  Sherri Sullivan will benefit from skilled PT to address above deficits to improve mobility and activity tolerance with decreased pain interference.  OBJECTIVE IMPAIRMENTS: Abnormal gait, decreased activity tolerance, decreased balance, decreased endurance, decreased knowledge of condition, decreased mobility, difficulty walking, decreased ROM, decreased strength, increased fascial restrictions, impaired perceived functional ability, increased muscle spasms, impaired flexibility, impaired UE  functional use, improper body mechanics, postural dysfunction, and pain.   ACTIVITY LIMITATIONS: carrying, lifting, sitting, standing, squatting, sleeping, stairs, transfers, bed mobility, dressing, and locomotion level  PARTICIPATION LIMITATIONS: meal prep, cleaning, laundry, driving, shopping, and community activity  PERSONAL FACTORS: Fitness, Past/current experiences, Time since onset of injury/illness/exacerbation, and 3+ comorbidities: Arthritis, HTN, s/p R CTR 2024, lumbar spondylolisthesis with radiculopathy, s/p L5-S1 MAS PLIF 2016, cervical spondylosis/DDD, cervical radiculopathy, dizziness, LE edema, RTC tendinopathy, obesity, SVT s/p ablation, h/o B knee pain are also affecting patient's functional outcome.   REHAB POTENTIAL: Good  CLINICAL DECISION MAKING: Unstable/unpredictable  EVALUATION COMPLEXITY: High   GOALS: Goals reviewed with patient? Yes  SHORT TERM GOALS: Target date: 11/01/2023  Patient will be independent with initial HEP. Baseline:  10/03/23 - initial LE HEP provided today 10/05/23 - HEP reviewed and modified to improve patient tolerance Goal status: IN PROGRESS - 10/11/23 - Introduced UE HEP  2.  Patient will report at least 25% improvement in R hip and elbow pain to improve QOL. Baseline: R hip - 9/10 on eval, up to 10/10 at worst; R elbow - 3/10 on eval, up to 9/10 at worst Goal status: IN PROGRESS - 10/03/23 - no R elbow pain today  3.  Patient will improve R hip flexion AROM to >/= 90 and R hip ER AROM to >/= 15 to increase ease of car transfers. Baseline: Refer to above LE ROM table Goal status: INITIAL  LONG TERM GOALS: Target date: 12/13/2023   Patient will be independent with advanced/ongoing HEP to improve outcomes and carryover.  Baseline:  Goal status: INITIAL  2.  Patient will report at least 50-75% improvement in R hip and elbow pain to improve QOL. Baseline: R hip - 9/10 on eval, up to 10/10 at worst; R elbow - 3/10 on eval, up to 9/10 at  worst Goal status: INITIAL  3.  Patient will demonstrate functional pain free R hip ROM to perform ADLs.   Baseline: Refer to above LE ROM table Goal status: INITIAL  4.  Patient will demonstrate improved B UE and scapular strength to >/= 4 to 4+/5 for functional UE use. Baseline: Refer to above UE MMT table Goal status: INITIAL  5.  Patient will demonstrate improved B LE strength to >/= 4 to 4+/5 for improved stability and ease of mobility. Baseline: Refer to above LE MMT table Goal status: INITIAL  6.  Patient will be able to ambulate with normal gait pattern without increased pain and be able to ascend/descend stairs with 1 HR  and reciprocal step pattern safely to access home and community.  Baseline:  Goal status: INITIAL  7. Patient will report >/= 40/80 on LEFS to demonstrate improved functional ability. Baseline: 19 / 80 = 23.8 % Goal status: INITIAL  8.  Patient will report </= 29% on QuickDASH to demonstrate improved functional ability. Baseline: 43.2 / 100 = 43.2 % Goal status: INITIAL  9.  Patient will report <25% sleep disturbance due to pain. Baseline: Both R hip and elbow pain interfere with sleep Goal status: INITIAL    PLAN:  PT FREQUENCY: 2x/week  PT DURATION: 12 weeks  PLANNED INTERVENTIONS: 02835- PT Re-evaluation, 97750- Physical Performance Testing, 97110-Therapeutic exercises, 97530- Therapeutic activity, [id number]- Neuromuscular re-education, 97535- Self Care, 02859- Manual therapy, [phone]- Gait training, [phone]- Aquatic Therapy, [phone]- Electrical stimulation (unattended), [phone]- Ultrasound, 02966- Ionotophoresis 4mg /ml Dexamethasone , 79439 (1-2 muscles), 20561 (3+ muscles)- Dry Needling, Patient/Family education, Balance training, Stair training, Taping, Joint mobilization, Spinal mobilization, DME instructions, Cryotherapy, and Moist heat  PLAN FOR NEXT SESSION: Continue to incorporate LE and core/lumbopelvic strengthening - update HEP as indicated; progress  proximal LE stretching and R hip ROM; MT +/- TPDN and/or modalities to address abnormal muscle tension and pain - possible trial of ionto patch to R lateral epicondyle/wrist extensor group and/or R greater trochanter; review initial HEP for UE flexibility and strengthening PRN   Nikolina Simerson, Student-PT 10/18/2023, 5:13 PM

## 2023-10-20 ENCOUNTER — Ambulatory Visit

## 2023-10-20 DIAGNOSIS — M6281 Muscle weakness (generalized): Secondary | ICD-10-CM

## 2023-10-20 DIAGNOSIS — R2689 Other abnormalities of gait and mobility: Secondary | ICD-10-CM

## 2023-10-20 DIAGNOSIS — M25551 Pain in right hip: Secondary | ICD-10-CM | POA: Diagnosis not present

## 2023-10-20 DIAGNOSIS — M25521 Pain in right elbow: Secondary | ICD-10-CM

## 2023-10-20 NOTE — Therapy (Signed)
 OUTPATIENT PHYSICAL THERAPY TREATMENT   Patient Name: Sherri Sullivan MRN: [id number] DOB:07/31/1960, 63 y.o., female Today's Date: 10/20/2023  END OF SESSION:  PT End of Session - 10/20/23 1616     Visit Number 6    Date for PT Re-Evaluation 12/13/23    Authorization Type [employer]    Authorization Time Period Prior authorization not required    Progress Note Due on Visit 10    PT Start Time 1535    PT Stop Time 1615    PT Time Calculation (min) 40 min    Activity Tolerance Patient tolerated treatment well    Behavior During Therapy WFL for tasks assessed/performed              Past Medical History:  Diagnosis Date   Anginal pain (HCC)    Arthritis    L shoulder- has had injections, degenerative changes in lumbar spine    Back pain    Chicken pox as a child   Constipation    Dizziness    Dysrhythmia 2013   SVT- treated by ablation by Dr. Waddell to f/u with as needed basis    Encounter for preventative adult health care exam with abnormal findings 02/09/2015   HTN (hypertension)    Hypokalemia 02/09/2015   Joint pain    Leg edema    Measles as a child   Measles as a child   Obesity    Panic attack    during episode of feeling to crowded    Preventative health care 02/09/2015   Seasonal allergies    SOB (shortness of breath) 09/23/2011   a little bit; at rest; before ablation, SOB again now (08/2014- due to lack  of exercise)    SVT (supraventricular tachycardia) (HCC)    Uterine fibroid 12/17/2012   Cervical polyp per patient Follows with [facility], Dr Tanda Mulch   Past Surgical History:  Procedure Laterality Date   ABDOMINAL HYSTERECTOMY  1990's   CARDIAC ELECTROPHYSIOLOGY STUDY AND ABLATION  09/23/11   MAXIMUM ACCESS (MAS)POSTERIOR LUMBAR INTERBODY FUSION (PLIF) 1 LEVEL N/A 09/24/2014   Procedure: L5-S1 MAS PLIF ;  Surgeon: Fairy Levels, MD;  Location: [facility];  Service: Neurosurgery;  Laterality: N/A;  L5-S1 MAS PLIF fusion    SUPRAVENTRICULAR TACHYCARDIA ABLATION N/A 09/23/2011   Procedure: SUPRAVENTRICULAR TACHYCARDIA ABLATION;  Surgeon: Danelle LELON Waddell, MD;  Location: [facility] CATH LAB;  Service: Cardiovascular;  Laterality: N/A;   TUBAL LIGATION  1980's   Patient Active Problem List   Diagnosis Date Noted   Severe sleep apnea 11/15/2022   Carpal tunnel syndrome 11/01/2022   Numbness of hand 11/01/2022   Cervical spondylosis 11/01/2022   Cervical radiculopathy 11/01/2022   Needs sleep apnea assessment 08/16/2022   H/O colonoscopy with polypectomy 08/16/2022   History of COVID-19 12/06/2021   Dyspnea 11/27/2020   DDD (degenerative disc disease), cervical 11/27/2020   Atypical chest pain 01/18/2019   Acute left-sided thoracic back pain 01/18/2019   Hyperglycemia 01/09/2018   Colon polyp 08/29/2017   Pain in right knee 06/28/2017   Pain in left knee 06/28/2017   Vitamin D  deficiency 02/25/2017   Polyneuropathy 01/21/2016   Hypokalemia 02/09/2015   Encounter for preventative adult health care exam with abnormal findings 02/09/2015   Preventative health care 02/09/2015   Spondylolisthesis of lumbar region 09/24/2014   Spinal stenosis 05/22/2014   Spondylolisthesis at L5-S1 level 05/22/2014   Lumbar radiculopathy 05/22/2014   Neck pain 04/08/2014   Muscle cramps 01/13/2014  Tendinopathy of rotator cuff 11/28/2013   Essential hypertension, benign 11/28/2013   Urinary frequency 08/17/2013   Uterine fibroid 12/17/2012   Obesity    SVT (supraventricular tachycardia) (HCC) 09/21/2011   LOW BACK PAIN 01/04/2008   Hyperlipidemia 02/28/2007   Allergic rhinitis 02/28/2007   KNEE PAIN, LEFT 02/28/2007    PCP: Domenica Harlene LABOR, MD   REFERRING PROVIDER: Joane Artist RAMAN, MD   REFERRING DIAG:  [phone] (ICD-10-CM) - Right elbow pain  M25.551 (ICD-10-CM) - Right hip pain   THERAPY DIAG:  Pain in right hip  Pain in right elbow  Muscle weakness (generalized)  Other abnormalities of gait and  mobility  RATIONALE FOR EVALUATION AND TREATMENT: Rehabilitation  ONSET DATE: R hip ~1 year; R elbow 3-4 months  NEXT MD VISIT: 10/25/2023   SUBJECTIVE:   SUBJECTIVE STATEMENT: Pt reports not having as much coughing from asthma, reports also noting catching in R hip today with increased pain  EVAL:  Pt reports her R elbow has been getting better since she saw the MD - had been hurting for ~3 months prior to seeing MD.  She has been wearing elbow brace recommended by MD but not sure if she is positioning it properly as it continues to slide down her arm (does not have brace with her on eval).  Pain localized to R lateral epicondyle.  Still has difficulty lifting things with R hand.  R hip has been hurting closer to 1 year and continues to worsen.  Pain originates at lateral hip and radiates into R groin and anterior thigh, but denies numbness or tingling.  She has difficulty lifting her R foot in/out of a car.  PAIN: Are you having pain? Yes: NPRS scale: 6/10   Pain location: R lateral hip into groin and R anterior thigh  Pain description: stabbing  Aggravating factors: prolonged standing or walking, climbing stairs, putting on socks, rolling over in bed  Relieving factors: nothing so far (tried heat and ice), maybe rubbing motion when applying Aspercreme   Are you having pain? No - lateral epicondyle of R elbow  PERTINENT HISTORY: Arthritis, HTN, s/p R CTR 2024, lumbar spondylolisthesis with radiculopathy, s/p L5-S1 MAS PLIF 2016, cervical spondylosis/DDD, cervical radiculopathy, dizziness, LE edema, RTC tendinopathy, obesity, SVT s/p ablation, h/o B knee pain  PRECAUTIONS: None  HAND DOMINANCE: Right  RED FLAGS: None  WEIGHT BEARING RESTRICTIONS: No  FALLS:  Has patient fallen in last 6 months? No  LIVING ENVIRONMENT: Lives with: lives with their spouse Lives in: House/apartment Stairs: Yes: Internal: 14 x 2 steps; on right going up and External: 3+4 steps; none Has  following equipment at home: None  OCCUPATION: Retired  PLOF: Independent and Leisure: binge watching Netflix, light gardening   PATIENT GOALS: Better mobility with less pain.   OBJECTIVE: (objective measures completed at initial evaluation unless otherwise dated)  DIAGNOSTIC FINDINGS:  08/30/23 - Diagnostic Limited MSK Ultrasound of: Right lateral elbow Intact common extensor tendon origin at lateral epicondyle.  Small avulsion fleck present at superficial portion of lateral epicondyle. Increased vascular activity on Doppler consistent with neovascularity. No visible tears present in the tendon. Impression: Lateral epicondylitis  08/30/23 - DG hip IMPRESSION: Moderate to severe right hip degenerative change.  PATIENT SURVEYS:  Quick Dash: 43.2 / 100 = 43.2 % LEFS: 19 / 80 = 23.8 %, moderate functional limitation  COGNITION: Overall cognitive status: Within functional limits for tasks assessed     SENSATION: WFL  EDEMA:  N/A  POSTURE:  rounded shoulders, forward head, flexed trunk , and weight shift left  PALPATION: Increased muscle tension with TTP over R glutes and piriformis.  Mildly TTP over R greater trochanter.  TTP over R wrist extensor group and lateral epicondyle.  UPPER EXTREMITY ROM:  Grossly WFL  UPPER EXTREMITY MMT:  MMT Right eval Left eval  Shoulder flexion 4- 4  Shoulder extension 4 4  Shoulder abduction 4- 4+  Shoulder adduction    Shoulder internal rotation 4+ 4+  Shoulder external rotation 4- 4  Middle trapezius 3+ 4-  Lower trapezius 3- 3-  Elbow flexion 4- 4+  Elbow extension 4- 4-  Wrist flexion    Wrist extension    Wrist ulnar deviation    Wrist radial deviation    Wrist pronation    Wrist supination    Grip strength (lbs) 13.67# 26.33#  (Blank rows = not tested)  MUSCLE LENGTH: Hamstrings: mildly tight L>R ITB: mod tight B Piriformis: severe tight R>L Hip flexors: mod tight B Quads: mild tight B Heelcord:   LOWER  EXTREMITY ROM:  Active ROM Right eval Left eval  Hip flexion 74 91  Hip extension    Hip abduction    Hip adduction    Hip internal rotation 39 44  Hip external rotation 7 27  Knee flexion    Knee extension    Ankle dorsiflexion    Ankle plantarflexion    Ankle inversion    Ankle eversion     (Blank rows = not tested)  LOWER EXTREMITY MMT:  MMT Right eval Left eval  Hip flexion 3+ p! 4-  Hip extension 3- 3-  Hip abduction 3- 3+  Hip adduction 2+ 3-  Hip internal rotation 4 4+  Hip external rotation 2 4-  Knee flexion 4- 4  Knee extension 4- 4  Ankle dorsiflexion 4- 4  Ankle plantarflexion    Ankle inversion    Ankle eversion     (Blank rows = not tested)  LOWER EXTREMITY SPECIAL TESTS:  Hip special tests: Belvie (FABER) test: positive , Thomas test: positive , and Ober's test: negative  FUNCTIONAL TESTS:  5 times sit to stand: 27.40 sec  TRANSFERS: Assistive device utilized: None  Sit to stand: Complete Independence and Modified independence -typically relies on UE assist but able to complete sit to stand without UE assist although tendency for weight shift to L with R genu valgum Stand to sit: Complete Independence and Modified independence Chair to chair: Modified independence Floor: NT   TODAY'S TREATMENT: 10/20/2023  THERAPEUTIC EXERCISE: To improve strength, ROM, and flexibility.  Demonstration, verbal and tactile cues throughout for technique. Nustep L3x93min  LTR with orange pball x 10 B DKTC orange pball x 10 B Bridges 3x10 with RTB hip ABD  SELF CARE HOME MANAGEMENT: to review HEP and ensure proper/safe performance of exercises Seated hip ABD RTB unilateral 2x10 each side Seated marching RTB X 10 B - fatigue in R hip Seated HS stretch x 30 RLE Seated piriformis stretch leg on 9' stool x 30 RLE Seated hip flexor stretch BLE x 30 Standing ITB stretch x 30 RLE   10/18/2023  THERAPEUTIC EXERCISE: To improve strength, ROM, and flexibility.   Demonstration, verbal and tactile cues throughout for technique. Rec Bike - L1 x 6 min Seated R figure-4 piriformis stretch with foot positioned on wooden box x 30 (had to elevated treatment table, therefore may need to continue with 9 stool for a while more) Standing hip ABD 3 x  10 bil, UE support on wall ladder for balance Seated hip ADD isometric ball squeeze 10 x 3, 3 sets  NEUROMUSCULAR RE-EDUCATION: To improve balance, coordination, and reduce fall risk.  Hooklying TrA alt unilateral bent-knee fallout 2 x 10 Bridge + B hip ABD clam x 10 Seated alt hip flexion march with arms outstretched 2 x 60, 2nd set with 3# ankle weights  L unilateral heel raises x 8 with UE support on wall, unable on R  THERAPEUTIC ACTIVITIES: To improve functional performance.  Demonstration, verbal and tactile cues throughout for technique.  Sit <> stand from chair 2 x 10 - intermittently with UE assist from armrests, although some reps with arms crossed on chest - easier to complete sit to stand rise with more difficulty noted on eccentric lowering   10/11/2023  THERAPEUTIC EXERCISE: To improve strength, endurance, ROM, and flexibility.  Demonstration, verbal and tactile cues throughout for technique.  NuStep - L4 x 6 min (UE/LE to promote muscle warm-up and tissue perfusion) Seated R wrist extensor stretch x 30 Seated R wrist flexor stretch x 30 Seated R forearm pronation/supination 2# 10 x 5 Seated R eccentric wrist extension 2# x 10 Seated R eccentric wrist flexion 2# x 10 Yellow Thera-Putty gross grip and twist grip Seated B wrist flexion/extension roll-up with RTB attached to PVC pipe   10/05/2023  THERAPEUTIC EXERCISE: To improve strength, endurance, ROM, and flexibility.  Demonstration, verbal and tactile cues throughout for technique.  Rec Bike - L1 x 6 min Seated lunge position R hip flexor stretch 3 x 30 Seated R hip hinge HS stretch with foot on floor 2 x 30 Seated R figure-4 piriformis  stretch with foot positioned on 9 stool 2 x 30 Standing R glute stretch with foot on wooden box 2 x 30 Seated B hip ADD ball squeeze isometric 10 x 5, 2 sets Seated RTB B hip ABD/ER clam 10 x 3-5, 2 sets Seated RTB hip flexion march x 10, cues not to lean back when lifting R LE  MANUAL THERAPY: To promote normalized muscle tension, improved flexibility, improved joint mobility, increased ROM, and reduced pain utilizing connective tissue massage, therapeutic massage, and manual TP therapy. STM/DTM, manual TPR, and IASTM with foam roller to R iliopsoas, TFL and RF  SELF CARE: Provided education to prevent loss of gains achieved with physical therapy.  Provided instruction in self-STM techniques to R hip flexors using tennis ball, foam roller or rolling pin.    10/03/2023 THERAPEUTIC EXERCISE: To improve strength, endurance, ROM, and flexibility.  Demonstration, verbal and tactile cues throughout for technique.  NuStep - L4 x 6 min (UE/LE to promote muscle warm-up and tissue perfusion) Supine R HS stretch with strap x 30 Hooklying R HS stretch with strap x 30 - preferred  Supine R ITB stretch with strap x 30 - painful Unable to achieve R SKTC or R bent knee glute stretch position in supine  Unable to achieve R figure-4 or KTOS piriformis stretch position in hooklying  Mod thomas R quad/hip flexor stretch with strap 2 x 30 Unable to achieve R side-sitting figure-4/pigeon piriformis stretch position in sitting Seated R figure-4 piriformis stretch with foot positioned on 9 stool 2 x 30 Standing R glute stretch with foot on wooden box 2 x 30 Seated B hip ADD ball squeeze isometric 10 x 5, 2 sets Seated RTB B hip ABD/ER clam 10 x 3-5, 2 sets Seated RTB hip flexion march x 10, cues not to lean back when  lifting R LE   09/20/2023 - Eval SELF CARE:  Reviewed eval findings and role of PT in addressing identified deficits as well as instruction in ice massage for R lateral epicondylitis  (see below).    PATIENT EDUCATION:  Education details: HEP update - Continue UE stretches and strengthening  Person educated: Patient Education method: Explanation, Demonstration, Verbal cues, Tactile cues, and MedBridgeGO app updated Education comprehension: verbalized understanding, returned demonstration, verbal cues required, tactile cues required, and needs further education  HOME EXERCISE PROGRAM: *Pt using MedBridgeGO app  Access Code: [email or web address] URL: [email or web address] Date: 10/11/2023 Prepared by: Elijah Hidden  Exercises - Seated Hip Flexor Stretch  - 2-3 x daily - 7 x weekly - 3 reps - 30 sec hold - Seated Hamstring Stretch  - 2-3 x daily - 7 x weekly - 3 reps - 30 sec hold - Seated Piriformis Stretch  - 2-3 x daily - 7 x weekly - 3 reps - 30 sec hold - Standing Gluteal Stretch on Chair  - 2-3 x daily - 7 x weekly - 3 reps - 30 sec hold - Standing ITB Stretch (Mirrored)  - 2-3 x daily - 7 x weekly - 3 reps - 30 sec hold - Seated Hip Adduction Squeeze with Ball  - 1 x daily - 7 x weekly - 2 sets - 10 reps - 3-5 sec hold - Seated Hip Abduction with Resistance  - 1 x daily - 7 x weekly - 2 sets - 10 reps - 3 sec hold - Seated March with Resistance  - 1 x daily - 7 x weekly - 2 sets - 10 reps - 3 sec hold - Seated Wrist Extension Stretch  - 2 x daily - 7 x weekly - 3 reps - 30 sec hold - Seated Wrist Flexion Stretch  - 2 x daily - 7 x weekly - 3 reps - 30 sec hold - Putty Squeezes  - 1 x daily - 7 x weekly - 2 sets - 10 reps - 3 sec hold - Seated Eccentric Wrist Flexion with Dumbbell  - 1 x daily - 3 x weekly - 2 sets - 10 reps - 3 sec hold - Seated Eccentric Wrist Extension  - 1 x daily - 3 x weekly - 2 sets - 10 reps - 3 sec hold - Seated Wrist Supination Pronation with Can  - 1 x daily - 3 x weekly - 2 sets - 10 reps - 3 sec hold - Wrist Flexion and Extension with Resistance Bar  - 1 x daily - 3 x weekly - 2 sets - 10 reps - 3 sec hold  Patient Education -  Ice Massage   ASSESSMENT:  CLINICAL IMPRESSION: Pt with good response to treatment. She requested to review HEP for her R hip, no progressions made today d/t some increased R hip pain and catching reported before session. Pt showed good demo and understanding of HEP with minor cuing. No reports of pain in R elbow today. Sherri Sullivan will benefit from continued skilled PT to address her deficits in ROM, strength, and flexibility to improve her mobility and activity tolerance w/ dec'd pain interference.   EVAL:  Sherri Sullivan is a 63 y.o. female who was referred to physical therapy for evaluation and treatment for R hip and R elbow pain.  Patient reports onset of R hip pain beginning ~1+ year ago.  Pain is worse with prolonged standing or walking, climbing stairs, putting on socks, rolling over in bed  and limits sleeping.  R elbow pain with more recent onset ~3-4 months ago and attributed to secondary to lateral epicondylitis.  Pain is worse with repetitive motion - folding laundry, working/playing on computer, lifting heavy items.  Patient has deficits in R hip ROM, proximal LE flexibility, R>L B LE, UE and grip strength, abnormal posture, and TTP with abnormal muscle tension which are interfering with ADLs and are impacting quality of life.  On LEFS patient scored 19/80 demonstrating moderate functional limitation.  On QuickDASH patient scored 43.2/100 demonstrating 43.2% disability.  Harmoney will benefit from skilled PT to address above deficits to improve mobility and activity tolerance with decreased pain interference.  OBJECTIVE IMPAIRMENTS: Abnormal gait, decreased activity tolerance, decreased balance, decreased endurance, decreased knowledge of condition, decreased mobility, difficulty walking, decreased ROM, decreased strength, increased fascial restrictions, impaired perceived functional ability, increased muscle spasms, impaired flexibility, impaired UE functional use, improper body mechanics, postural  dysfunction, and pain.   ACTIVITY LIMITATIONS: carrying, lifting, sitting, standing, squatting, sleeping, stairs, transfers, bed mobility, dressing, and locomotion level  PARTICIPATION LIMITATIONS: meal prep, cleaning, laundry, driving, shopping, and community activity  PERSONAL FACTORS: Fitness, Past/current experiences, Time since onset of injury/illness/exacerbation, and 3+ comorbidities: Arthritis, HTN, s/p R CTR 2024, lumbar spondylolisthesis with radiculopathy, s/p L5-S1 MAS PLIF 2016, cervical spondylosis/DDD, cervical radiculopathy, dizziness, LE edema, RTC tendinopathy, obesity, SVT s/p ablation, h/o B knee pain are also affecting patient's functional outcome.   REHAB POTENTIAL: Good  CLINICAL DECISION MAKING: Unstable/unpredictable  EVALUATION COMPLEXITY: High   GOALS: Goals reviewed with patient? Yes  SHORT TERM GOALS: Target date: 11/01/2023  Patient will be independent with initial HEP. Baseline:  10/03/23 - initial LE HEP provided today 10/05/23 - HEP reviewed and modified to improve patient tolerance Goal status: IN PROGRESS - 10/20/23- MET for LE HEP  2.  Patient will report at least 25% improvement in R hip and elbow pain to improve QOL. Baseline: R hip - 9/10 on eval, up to 10/10 at worst; R elbow - 3/10 on eval, up to 9/10 at worst Goal status: IN PROGRESS - 10/03/23 - no R elbow pain today  3.  Patient will improve R hip flexion AROM to >/= 90 and R hip ER AROM to >/= 15 to increase ease of car transfers. Baseline: Refer to above LE ROM table Goal status: INITIAL  LONG TERM GOALS: Target date: 12/13/2023   Patient will be independent with advanced/ongoing HEP to improve outcomes and carryover.  Baseline:  Goal status: INITIAL  2.  Patient will report at least 50-75% improvement in R hip and elbow pain to improve QOL. Baseline: R hip - 9/10 on eval, up to 10/10 at worst; R elbow - 3/10 on eval, up to 9/10 at worst Goal status: INITIAL  3.  Patient will  demonstrate functional pain free R hip ROM to perform ADLs.   Baseline: Refer to above LE ROM table Goal status: INITIAL  4.  Patient will demonstrate improved B UE and scapular strength to >/= 4 to 4+/5 for functional UE use. Baseline: Refer to above UE MMT table Goal status: INITIAL  5.  Patient will demonstrate improved B LE strength to >/= 4 to 4+/5 for improved stability and ease of mobility. Baseline: Refer to above LE MMT table Goal status: INITIAL  6.  Patient will be able to ambulate with normal gait pattern without increased pain and be able to ascend/descend stairs with 1 HR and reciprocal step pattern safely to access home and community.  Baseline:  Goal status: INITIAL  7. Patient will report >/= 40/80 on LEFS to demonstrate improved functional ability. Baseline: 19 / 80 = 23.8 % Goal status: INITIAL  8.  Patient will report </= 29% on QuickDASH to demonstrate improved functional ability. Baseline: 43.2 / 100 = 43.2 % Goal status: INITIAL  9.  Patient will report <25% sleep disturbance due to pain. Baseline: Both R hip and elbow pain interfere with sleep Goal status: INITIAL    PLAN:  PT FREQUENCY: 2x/week  PT DURATION: 12 weeks  PLANNED INTERVENTIONS: 02835- PT Re-evaluation, 97750- Physical Performance Testing, 97110-Therapeutic exercises, 97530- Therapeutic activity, [id number]- Neuromuscular re-education, 97535- Self Care, 02859- Manual therapy, [phone]- Gait training, [phone]- Aquatic Therapy, [phone]- Electrical stimulation (unattended), [phone]- Ultrasound, [id number]- Ionotophoresis 4mg /ml Dexamethasone , 79439 (1-2 muscles), [id number] (3+ muscles)- Dry Needling, Patient/Family education, Balance training, Stair training, Taping, Joint mobilization, Spinal mobilization, DME instructions, Cryotherapy, and Moist heat  PLAN FOR NEXT SESSION: Continue to incorporate LE and core/lumbopelvic strengthening - update HEP as indicated; progress proximal LE stretching and R hip ROM; MT +/-  TPDN and/or modalities to address abnormal muscle tension and pain - possible trial of ionto patch to R lateral epicondyle/wrist extensor group and/or R greater trochanter; review initial HEP for UE flexibility and strengthening PRN   Sol LITTIE Gaskins, PTA 10/20/2023, 4:17 PM

## 2023-10-24 ENCOUNTER — Ambulatory Visit: Admitting: Physical Therapy

## 2023-10-24 ENCOUNTER — Encounter: Payer: Self-pay | Admitting: Physical Therapy

## 2023-10-24 DIAGNOSIS — M25521 Pain in right elbow: Secondary | ICD-10-CM

## 2023-10-24 DIAGNOSIS — R2689 Other abnormalities of gait and mobility: Secondary | ICD-10-CM

## 2023-10-24 DIAGNOSIS — M6281 Muscle weakness (generalized): Secondary | ICD-10-CM

## 2023-10-24 DIAGNOSIS — M25551 Pain in right hip: Secondary | ICD-10-CM | POA: Diagnosis not present

## 2023-10-24 NOTE — Therapy (Signed)
 OUTPATIENT PHYSICAL THERAPY TREATMENT  Progress Note  Reporting Period 09/20/2023 to 10/24/2023   See note below for Objective Data and Assessment of Progress/Goals.     Patient Name: Sherri Sullivan MRN: [id number] DOB:07-Aug-1960, 63 y.o., female Today's Date: 10/24/2023  END OF SESSION:  PT End of Session - 10/24/23 1403     Visit Number 7    Date for PT Re-Evaluation 12/13/23    Authorization Type [employer]    Authorization Time Period Prior authorization not required    Progress Note Due on Visit 17   MD PN for 10/25/23 on visit #7 (10/24/23)   PT Start Time 1403    PT Stop Time 1445    PT Time Calculation (min) 42 min    Activity Tolerance Patient tolerated treatment well    Behavior During Therapy [facility] for tasks assessed/performed               Past Medical History:  Diagnosis Date   Anginal pain (HCC)    Arthritis    L shoulder- has had injections, degenerative changes in lumbar spine    Back pain    Chicken pox as a child   Constipation    Dizziness    Dysrhythmia 2013   SVT- treated by ablation by Dr. Waddell to f/u with as needed basis    Encounter for preventative adult health care exam with abnormal findings 02/09/2015   HTN (hypertension)    Hypokalemia 02/09/2015   Joint pain    Leg edema    Measles as a child   Measles as a child   Obesity    Panic attack    during episode of feeling to crowded    Preventative health care 02/09/2015   Seasonal allergies    SOB (shortness of breath) 09/23/2011   a little bit; at rest; before ablation, SOB again now (08/2014- due to lack  of exercise)    SVT (supraventricular tachycardia) (HCC)    Uterine fibroid 12/17/2012   Cervical polyp per patient Follows with [facility], Dr Tanda Mulch   Past Surgical History:  Procedure Laterality Date   ABDOMINAL HYSTERECTOMY  1990's   CARDIAC ELECTROPHYSIOLOGY STUDY AND ABLATION  09/23/11   MAXIMUM ACCESS (MAS)POSTERIOR LUMBAR INTERBODY FUSION  (PLIF) 1 LEVEL N/A 09/24/2014   Procedure: L5-S1 MAS PLIF ;  Surgeon: Fairy Levels, MD;  Location: [facility] NEURO ORS;  Service: Neurosurgery;  Laterality: N/A;  L5-S1 MAS PLIF fusion   SUPRAVENTRICULAR TACHYCARDIA ABLATION N/A 09/23/2011   Procedure: SUPRAVENTRICULAR TACHYCARDIA ABLATION;  Surgeon: Danelle LELON Waddell, MD;  Location: [facility] CATH LAB;  Service: Cardiovascular;  Laterality: N/A;   TUBAL LIGATION  1980's   Patient Active Problem List   Diagnosis Date Noted   Severe sleep apnea 11/15/2022   Carpal tunnel syndrome 11/01/2022   Numbness of hand 11/01/2022   Cervical spondylosis 11/01/2022   Cervical radiculopathy 11/01/2022   Needs sleep apnea assessment 08/16/2022   H/O colonoscopy with polypectomy 08/16/2022   History of COVID-19 12/06/2021   Dyspnea 11/27/2020   DDD (degenerative disc disease), cervical 11/27/2020   Atypical chest pain 01/18/2019   Acute left-sided thoracic back pain 01/18/2019   Hyperglycemia 01/09/2018   Colon polyp 08/29/2017   Pain in right knee 06/28/2017   Pain in left knee 06/28/2017   Vitamin D  deficiency 02/25/2017   Polyneuropathy 01/21/2016   Hypokalemia 02/09/2015   Encounter for preventative adult health care exam with abnormal findings 02/09/2015   Preventative health care 02/09/2015  Spondylolisthesis of lumbar region 09/24/2014   Spinal stenosis 05/22/2014   Spondylolisthesis at L5-S1 level 05/22/2014   Lumbar radiculopathy 05/22/2014   Neck pain 04/08/2014   Muscle cramps 01/13/2014   Tendinopathy of rotator cuff 11/28/2013   Essential hypertension, benign 11/28/2013   Urinary frequency 08/17/2013   Uterine fibroid 12/17/2012   Obesity    SVT (supraventricular tachycardia) (HCC) 09/21/2011   LOW BACK PAIN 01/04/2008   Hyperlipidemia 02/28/2007   Allergic rhinitis 02/28/2007   KNEE PAIN, LEFT 02/28/2007    PCP: Domenica Harlene LABOR, MD   REFERRING PROVIDER: Joane Artist RAMAN, MD   REFERRING DIAG:  [phone] (ICD-10-CM) - Right elbow pain   M25.551 (ICD-10-CM) - Right hip pain   THERAPY DIAG:  Pain in right hip  Pain in right elbow  Muscle weakness (generalized)  Other abnormalities of gait and mobility  RATIONALE FOR EVALUATION AND TREATMENT: Rehabilitation  ONSET DATE: R hip ~1 year; R elbow 3-4 months  NEXT MD VISIT: 10/25/2023   SUBJECTIVE:   SUBJECTIVE STATEMENT: Pt reports 90% improvement in her R elbow, although her hip seems to be more slowly improving.  She Sullivan temporary relief from the stretches when performing her exercises but does not experience lasting relief.  EVAL:  Pt reports her R elbow has been getting better since she saw the MD - had been hurting for ~3 months prior to seeing MD.  She has been wearing elbow brace recommended by MD but not sure if she is positioning it properly as it continues to slide down her arm (does not have brace with her on eval).  Pain localized to R lateral epicondyle.  Still has difficulty lifting things with R hand.  R hip has been hurting closer to 1 year and continues to worsen.  Pain originates at lateral hip and radiates into R groin and anterior thigh, but denies numbness or tingling.  She has difficulty lifting her R foot in/out of a car.  PAIN: Are you having pain? Yes: NPRS scale: 6/10   Pain location: R lateral hip into groin and R anterior thigh  Pain description: stabbing  Aggravating factors: prolonged standing or walking, climbing stairs, putting on socks, rolling over in bed  Relieving factors: nothing so far (tried heat and ice), maybe rubbing motion when applying Aspercreme   Are you having pain? No - lateral epicondyle of R elbow  PERTINENT HISTORY: Arthritis, HTN, s/p R CTR 2024, lumbar spondylolisthesis with radiculopathy, s/p L5-S1 MAS PLIF 2016, cervical spondylosis/DDD, cervical radiculopathy, dizziness, LE edema, RTC tendinopathy, obesity, SVT s/p ablation, h/o B knee pain  PRECAUTIONS: None  HAND DOMINANCE: Right  RED  FLAGS: None  WEIGHT BEARING RESTRICTIONS: No  FALLS:  Has patient fallen in last 6 months? No  LIVING ENVIRONMENT: Lives with: lives with their spouse Lives in: House/apartment Stairs: Yes: Internal: 14 x 2 steps; on right going up and External: 3+4 steps; none Has following equipment at home: None  OCCUPATION: Retired  PLOF: Independent and Leisure: binge watching Netflix, light gardening   PATIENT GOALS: Better mobility with less pain.   OBJECTIVE: (objective measures completed at initial evaluation unless otherwise dated)  DIAGNOSTIC FINDINGS:  08/30/23 - Diagnostic Limited MSK Ultrasound of: Right lateral elbow Intact common extensor tendon origin at lateral epicondyle.  Small avulsion fleck present at superficial portion of lateral epicondyle. Increased vascular activity on Doppler consistent with neovascularity. No visible tears present in the tendon. Impression: Lateral epicondylitis  08/30/23 - DG hip IMPRESSION: Moderate to severe right  hip degenerative change.  PATIENT SURVEYS:  Quick Dash: 43.2 / 100 = 43.2 % LEFS: 19 / 80 = 23.8 %, moderate functional limitation  COGNITION: Overall cognitive status: Within functional limits for tasks assessed     SENSATION: WFL  EDEMA:  N/A  POSTURE:  rounded shoulders, forward head, flexed trunk , and weight shift left  PALPATION: Increased muscle tension with TTP over R glutes and piriformis.  Mildly TTP over R greater trochanter.  TTP over R wrist extensor group and lateral epicondyle.  UPPER EXTREMITY ROM:  [facility]  UPPER EXTREMITY MMT:  MMT Right eval Left eval R 10/24/23 L 10/24/23  Shoulder flexion 4- 4 4+ 4+  Shoulder extension 4 4 4+ 4+  Shoulder abduction 4- 4+ 4+ 4+  Shoulder adduction      Shoulder internal rotation 4+ 4+ 5 5  Shoulder external rotation 4- 4 4+ 4+  Middle trapezius 3+ 4- 4 4  Lower trapezius 3- 3- 3- 3-  Elbow flexion 4- 4+ 4+ 5  Elbow extension 4- 4- 4+ 4+  Wrist flexion       Wrist extension      Wrist ulnar deviation      Wrist radial deviation      Wrist pronation      Wrist supination      Grip strength (lbs) 13.67# 26.33# 24.33# 29.33#  (Blank rows = not tested)  MUSCLE LENGTH: Hamstrings: mildly tight L>R ITB: mod tight B Piriformis: severe tight R>L Hip flexors: mod tight B Quads: mild tight B Heelcord:   LOWER EXTREMITY ROM:  Active ROM Right eval Left eval R 10/24/23 L 10/24/23  Hip flexion 74 91 82 94  Hip extension      Hip abduction      Hip adduction      Hip internal rotation 39 44 38 44  Hip external rotation 7 27 20  32  Knee flexion      Knee extension      Ankle dorsiflexion      Ankle plantarflexion      Ankle inversion      Ankle eversion       (Blank rows = not tested)  LOWER EXTREMITY MMT:  MMT Right eval Left eval R 10/24/23 L 10/24/23  Hip flexion 3+ p! 4- 4- p! 4+  Hip extension 3- 3- 3+ 3+  Hip abduction 3- 3+ 3+ 4  Hip adduction 2+ 3- 3+ limited ROM 4-  Hip internal rotation 4 4+ 4+ 4+  Hip external rotation 2 4- 4- limited ROM 4 limited ROM  Knee flexion 4- 4 4 4   Knee extension 4- 4 4+ 4+  Ankle dorsiflexion 4- 4 4+ 4+  Ankle plantarflexion      Ankle inversion      Ankle eversion       (Blank rows = not tested)  LOWER EXTREMITY SPECIAL TESTS:  Hip special tests: Belvie (FABER) test: positive , Thomas test: positive , and Ober's test: negative  FUNCTIONAL TESTS:  5 times sit to stand: 27.40 sec  TRANSFERS: Assistive device utilized: None  Sit to stand: Complete Independence and Modified independence -typically relies on UE assist but able to complete sit to stand without UE assist although tendency for weight shift to L with R genu valgum Stand to sit: Complete Independence and Modified independence Chair to chair: Modified independence Floor: NT   TODAY'S TREATMENT:  10/24/2023  THERAPEUTIC EXERCISE: To improve strength, ROM, and flexibility.  Demonstration, verbal and tactile cues  throughout for  technique. NuStep L3 x 6 min  Hooklying windshield wipers (B hip IR/ER) x 20 L S/L R hip ER clam x 10  THERAPEUTIC ACTIVITIES: To improve functional performance.  Demonstration, verbal and tactile cues throughout for technique. Hip ROM assessment UE/LE MMT   MANUAL THERAPY: To promote improved flexibility, improved joint mobility, increased ROM, pain modulation, and reduced pain utilizing joint mobilization. R hip grade II hip A/P mobs - increased pain R hip grade II-III distraction MWM for R hip ER   10/20/2023  THERAPEUTIC EXERCISE: To improve strength, ROM, and flexibility.  Demonstration, verbal and tactile cues throughout for technique. Nustep L3x52min  LTR with orange pball x 10 B DKTC orange pball x 10 B Bridges 3x10 with RTB hip ABD  SELF CARE HOME MANAGEMENT: to review HEP and ensure proper/safe performance of exercises Seated hip ABD RTB unilateral 2x10 each side Seated marching RTB X 10 B - fatigue in R hip Seated HS stretch x 30 RLE Seated piriformis stretch leg on 9' stool x 30 RLE Seated hip flexor stretch BLE x 30 Standing ITB stretch x 30 RLE   10/18/2023  THERAPEUTIC EXERCISE: To improve strength, ROM, and flexibility.  Demonstration, verbal and tactile cues throughout for technique. Rec Bike - L1 x 6 min Seated R figure-4 piriformis stretch with foot positioned on wooden box x 30 (had to elevated treatment table, therefore may need to continue with 9 stool for a while more) Standing hip ABD 3 x 10 bil, UE support on wall ladder for balance Seated hip ADD isometric ball squeeze 10 x 3, 3 sets  NEUROMUSCULAR RE-EDUCATION: To improve balance, coordination, and reduce fall risk.  Hooklying TrA alt unilateral bent-knee fallout 2 x 10 Bridge + B hip ABD clam x 10 Seated alt hip flexion march with arms outstretched 2 x 60, 2nd set with 3# ankle weights  L unilateral heel raises x 8 with UE support on wall, unable on R  THERAPEUTIC ACTIVITIES: To improve  functional performance.  Demonstration, verbal and tactile cues throughout for technique.  Sit <> stand from chair 2 x 10 - intermittently with UE assist from armrests, although some reps with arms crossed on chest - easier to complete sit to stand rise with more difficulty noted on eccentric lowering   10/11/2023  THERAPEUTIC EXERCISE: To improve strength, endurance, ROM, and flexibility.  Demonstration, verbal and tactile cues throughout for technique.  NuStep - L4 x 6 min (UE/LE to promote muscle warm-up and tissue perfusion) Seated R wrist extensor stretch x 30 Seated R wrist flexor stretch x 30 Seated R forearm pronation/supination 2# 10 x 5 Seated R eccentric wrist extension 2# x 10 Seated R eccentric wrist flexion 2# x 10 Yellow Thera-Putty gross grip and twist grip Seated B wrist flexion/extension roll-up with RTB attached to PVC pipe   10/05/2023  THERAPEUTIC EXERCISE: To improve strength, endurance, ROM, and flexibility.  Demonstration, verbal and tactile cues throughout for technique.  Rec Bike - L1 x 6 min Seated lunge position R hip flexor stretch 3 x 30 Seated R hip hinge HS stretch with foot on floor 2 x 30 Seated R figure-4 piriformis stretch with foot positioned on 9 stool 2 x 30 Standing R glute stretch with foot on wooden box 2 x 30 Seated B hip ADD ball squeeze isometric 10 x 5, 2 sets Seated RTB B hip ABD/ER clam 10 x 3-5, 2 sets Seated RTB hip flexion march x 10, cues not to lean back  when lifting R LE  MANUAL THERAPY: To promote normalized muscle tension, improved flexibility, improved joint mobility, increased ROM, and reduced pain utilizing connective tissue massage, therapeutic massage, and manual TP therapy. STM/DTM, manual TPR, and IASTM with foam roller to R iliopsoas, TFL and RF  SELF CARE: Provided education to prevent loss of gains achieved with physical therapy.  Provided instruction in self-STM techniques to R hip flexors using tennis ball, foam  roller or rolling pin.    10/03/2023 THERAPEUTIC EXERCISE: To improve strength, endurance, ROM, and flexibility.  Demonstration, verbal and tactile cues throughout for technique.  NuStep - L4 x 6 min (UE/LE to promote muscle warm-up and tissue perfusion) Supine R HS stretch with strap x 30 Hooklying R HS stretch with strap x 30 - preferred  Supine R ITB stretch with strap x 30 - painful Unable to achieve R SKTC or R bent knee glute stretch position in supine  Unable to achieve R figure-4 or KTOS piriformis stretch position in hooklying  Mod thomas R quad/hip flexor stretch with strap 2 x 30 Unable to achieve R side-sitting figure-4/pigeon piriformis stretch position in sitting Seated R figure-4 piriformis stretch with foot positioned on 9 stool 2 x 30 Standing R glute stretch with foot on wooden box 2 x 30 Seated B hip ADD ball squeeze isometric 10 x 5, 2 sets Seated RTB B hip ABD/ER clam 10 x 3-5, 2 sets Seated RTB hip flexion march x 10, cues not to lean back when lifting R LE   09/20/2023 - Eval SELF CARE:  Reviewed eval findings and role of PT in addressing identified deficits as well as instruction in ice massage for R lateral epicondylitis (see below).    PATIENT EDUCATION:  Education details: progress with PT, ongoing PT POC, and HEP update  Person educated: Patient Education method: Explanation, Demonstration, Verbal cues, Tactile cues, and MedBridgeGO app updated Education comprehension: verbalized understanding, returned demonstration, verbal cues required, tactile cues required, and needs further education  HOME EXERCISE PROGRAM: *Pt using MedBridge[employer] app  Access Code: [email or web address] URL: [email or web address] Date: 10/24/2023 Prepared by: Sherri Sullivan  Exercises - Seated Hip Flexor Stretch  - 2-3 x daily - 7 x weekly - 3 reps - 30 sec hold - Seated Hamstring Stretch  - 2-3 x daily - 7 x weekly - 3 reps - 30 sec hold - Seated Piriformis Stretch  - 2-3  x daily - 7 x weekly - 3 reps - 30 sec hold - Standing Gluteal Stretch on Chair  - 2-3 x daily - 7 x weekly - 3 reps - 30 sec hold - Standing ITB Stretch (Mirrored)  - 2-3 x daily - 7 x weekly - 3 reps - 30 sec hold - Seated Hip Adduction Squeeze with Ball  - 1 x daily - 7 x weekly - 2 sets - 10 reps - 3-5 sec hold - Seated Hip Abduction with Resistance  - 1 x daily - 7 x weekly - 2 sets - 10 reps - 3 sec hold - Seated March with Resistance  - 1 x daily - 7 x weekly - 2 sets - 10 reps - 3 sec hold - Seated Wrist Extension Stretch  - 2 x daily - 7 x weekly - 3 reps - 30 sec hold - Seated Wrist Flexion Stretch  - 2 x daily - 7 x weekly - 3 reps - 30 sec hold - Putty Squeezes  - 1 x daily - 7 x weekly - 2 sets -  10 reps - 3 sec hold - Seated Eccentric Wrist Flexion with Dumbbell  - 1 x daily - 3 x weekly - 2 sets - 10 reps - 3 sec hold - Seated Eccentric Wrist Extension  - 1 x daily - 3 x weekly - 2 sets - 10 reps - 3 sec hold - Seated Wrist Supination Pronation with Can  - 1 x daily - 3 x weekly - 2 sets - 10 reps - 3 sec hold - Wrist Flexion and Extension with Resistance Bar  - 1 x daily - 3 x weekly - 2 sets - 10 reps - 3 sec hold - Supine Hip Internal and External Rotation  - 1 x daily - 7 x weekly - 2 sets - 10 reps - 3 sec hold - Clamshell  - 1 x daily - 3 x weekly - 2 sets - 10 reps - 3 sec hold  Patient Education - Ice Massage   ASSESSMENT:  CLINICAL IMPRESSION: Sherri Sullivan reports 90% improvement in her R elbow, although her hip seems to be more slowly improving with only 40-50% improvement noted in her R hip thus far.  Overall B UE strength improving including significant gain in R grip strength, with remaining weakness mostly localized to B lower traps.  R hip flexion and ER ROM improving but still remain limited.  Overall BLE strength also improving with greatest weakness remaining proximally R>L.  Sherri Sullivan temporary relief from her HEP stretches, however feels like the pain eventually  returns.  Limited tolerance for hip joint mobilization other than distraction but able to achieve improvement in R hip ER ROM with MWM.  She currently Sullivan pain localized to R greater trochanter, therefore may consider trial of ionto patch next visit.  Sherri Sullivan is progressing well towards her PT goals and will benefit from continued skilled PT to address her ongoing deficits in ROM, strength, and flexibility to improve her mobility and activity tolerance w/ decreased pain interference.   EVAL:  Sherri Sullivan is a 63 y.o. female who was referred to physical therapy for evaluation and treatment for R hip and R elbow pain.  Patient reports onset of R hip pain beginning ~1+ year ago.  Pain is worse with prolonged standing or walking, climbing stairs, putting on socks, rolling over in bed and limits sleeping.  R elbow pain with more recent onset ~3-4 months ago and attributed to secondary to lateral epicondylitis.  Pain is worse with repetitive motion - folding laundry, working/playing on computer, lifting heavy items.  Patient has deficits in R hip ROM, proximal LE flexibility, R>L B LE, UE and grip strength, abnormal posture, and TTP with abnormal muscle tension which are interfering with ADLs and are impacting quality of life.  On LEFS patient scored 19/80 demonstrating moderate functional limitation.  On QuickDASH patient scored 43.2/100 demonstrating 43.2% disability.  [place] will benefit from skilled PT to address above deficits to improve mobility and activity tolerance with decreased pain interference.  OBJECTIVE IMPAIRMENTS: Abnormal gait, decreased activity tolerance, decreased balance, decreased endurance, decreased knowledge of condition, decreased mobility, difficulty walking, decreased ROM, decreased strength, increased fascial restrictions, impaired perceived functional ability, increased muscle spasms, impaired flexibility, impaired UE functional use, improper body mechanics, postural dysfunction, and  pain.   ACTIVITY LIMITATIONS: carrying, lifting, sitting, standing, squatting, sleeping, stairs, transfers, bed mobility, dressing, and locomotion level  PARTICIPATION LIMITATIONS: meal prep, cleaning, laundry, driving, shopping, and community activity  PERSONAL FACTORS: Fitness, Past/current experiences, Time since onset of injury/illness/exacerbation, and  3+ comorbidities: Arthritis, HTN, s/p R CTR 2024, lumbar spondylolisthesis with radiculopathy, s/p L5-S1 MAS PLIF 2016, cervical spondylosis/DDD, cervical radiculopathy, dizziness, LE edema, RTC tendinopathy, obesity, SVT s/p ablation, h/o B knee pain are also affecting patient's functional outcome.   REHAB POTENTIAL: Good  CLINICAL DECISION MAKING: Unstable/unpredictable  EVALUATION COMPLEXITY: High   GOALS: Goals reviewed with patient? Yes  SHORT TERM GOALS: Target date: 11/01/2023  Patient will be independent with initial HEP. Baseline:  10/03/23 - initial LE HEP provided today 10/05/23 - HEP reviewed and modified to improve patient tolerance 10/20/23 - MET for LE HEP Goal status: MET - 10/24/23  2.  Patient will report at least 25% improvement in R hip and elbow pain to improve QOL. Baseline: R hip - 9/10 on eval, up to 10/10 at worst; R elbow - 3/10 on eval, up to 9/10 at worst 10/03/23 - no R elbow pain today Goal status: MET - 10/24/23 - 90% improvement in he R elbow; 40-50% improvement in her R hip  3.  Patient will improve R hip flexion AROM to >/= 90 and R hip ER AROM to >/= 15 to increase ease of car transfers. Baseline: Refer to above LE ROM table Goal status: PARTIALLY MET - 10/24/23 - R hip flexion 82, ER 20  LONG TERM GOALS: Target date: 12/13/2023   Patient will be independent with advanced/ongoing HEP to improve outcomes and carryover.  Baseline:  Goal status: IN PROGRESS - 10/24/23 - Met for current HEP, anticipate further updates  2.  Patient will report at least 50-75% improvement in R hip and elbow pain to  improve QOL. Baseline: R hip - 9/10 on eval, up to 10/10 at worst; R elbow - 3/10 on eval, up to 9/10 at worst Goal status: IN PROGRESS - 10/24/23 - 90% improvement in he R elbow; 40-50% improvement in her R hip  3.  Patient will demonstrate functional pain free R hip ROM to perform ADLs.   Baseline: Refer to above LE ROM table Goal status: IN PROGRESS - 10/24/23 - still limited in most motions  4.  Patient will demonstrate improved B UE and scapular strength to >/= 4 to 4+/5 for functional UE use. Baseline: Refer to above UE MMT table Goal status: IN PROGRESS - 10/24/23 - met except lower traps 3-/5  5.  Patient will demonstrate improved B LE strength to >/= 4 to 4+/5 for improved stability and ease of mobility. Baseline: Refer to above LE MMT table Goal status: IN PROGRESS - 10/24/23  6.  Patient will be able to ambulate with normal gait pattern without increased pain and be able to ascend/descend stairs with 1 HR and reciprocal step pattern safely to access home and community.  Baseline:  Goal status: INITIAL  7. Patient will report >/= 40/80 on LEFS to demonstrate improved functional ability. Baseline: 19 / 80 = 23.8 % Goal status: INITIAL  8.  Patient will report </= 29% on QuickDASH to demonstrate improved functional ability. Baseline: 43.2 / 100 = 43.2 % Goal status: INITIAL  9.  Patient will report <25% sleep disturbance due to pain. Baseline: Both R hip and elbow pain interfere with sleep Goal status: IN PROGRESS - 10/24/23 - pain interferes with sleep almost every night 20-30%   PLAN:  PT FREQUENCY: 2x/week  PT DURATION: 12 weeks  PLANNED INTERVENTIONS: 97164- PT Re-evaluation, 97750- Physical Performance Testing, 97110-Therapeutic exercises, 97530- Therapeutic activity, [id number]- Neuromuscular re-education, 97535- Self Care, [id number]- Manual therapy, [id number]- Gait training, [phone]- Aquatic  Therapy, G0283- Electrical stimulation (unattended), [phone]- Ultrasound, 02966-  Ionotophoresis 4mg /ml Dexamethasone , 20560 (1-2 muscles), 20561 (3+ muscles)- Dry Needling, Patient/Family education, Balance training, Stair training, Taping, Joint mobilization, Spinal mobilization, DME instructions, Cryotherapy, and Moist heat  PLAN FOR NEXT SESSION: Continue to incorporate LE and core/lumbopelvic strengthening - update HEP as indicated; progress proximal LE stretching and R hip ROM; MT +/- TPDN and/or modalities to address abnormal muscle tension and pain - possible trial of ionto patch to R greater trochanter; review initial HEP for UE flexibility and strengthening PRN   Cyera Balboni M Shanzay Hepworth, PT 10/24/2023, 7:27 PM

## 2023-10-25 ENCOUNTER — Other Ambulatory Visit: Payer: Self-pay

## 2023-10-25 ENCOUNTER — Ambulatory Visit: Admitting: Family Medicine

## 2023-10-25 VITALS — BP 124/84 | HR 66 | Ht 62.0 in | Wt 224.0 lb

## 2023-10-25 DIAGNOSIS — M25521 Pain in right elbow: Secondary | ICD-10-CM

## 2023-10-25 DIAGNOSIS — M25551 Pain in right hip: Secondary | ICD-10-CM | POA: Diagnosis not present

## 2023-10-25 NOTE — Progress Notes (Signed)
   [name] Ileana Collet, PhD, LAT, ATC acting as a scribe for Artist Lloyd, MD.  Sherri Sullivan is a 63 y.o. female who presents to [facility] today for f/u R hip and R elbow pain. Pt was last seen by Dr. Lloyd on 08/30/23 and was referred to PT, completing 7 visit.  Today, pt reports R elbow is feeling great. Elbow brace is helpful. R hip pain continues. Pt locates pain to the lateral aspect of her R hip. Intermittent radiating pain.   Dx imaging: 08/30/23 R hip XR  Pertinent review of systems: No fevers or chills  Relevant historical information: Sleep apnea   Exam:  BP 124/84   Pulse 66   Ht 5' 2 (1.575 m)   Wt 224 lb (101.6 kg)   SpO2 99%   BMI 40.97 kg/m  General: Well Developed, well nourished, and in no acute distress.   MSK: Right hip normal-appearing decreased range of motion.  Tender palpation greater trochanter redo strength hip abduction.    Lab and Radiology Results  Procedure: Real-time Ultrasound Guided Injection of right lateral hip greater trochanter bursa Device: Philips Affiniti 50G/GE Logiq Images permanently stored and available for review in PACS Verbal informed consent obtained.  Discussed risks and benefits of procedure. Warned about infection, bleeding, hyperglycemia damage to structures among others. Patient expresses understanding and agreement Time-out conducted.   Noted no overlying erythema, induration, or other signs of local infection.   Skin prepped in a sterile fashion.   Local anesthesia: Topical Ethyl chloride.   With sterile technique and under real time ultrasound guidance: 40 mg of Kenalog  and 2 mL of Marcaine  injected into greater trochanter bursa. Fluid seen entering the bursa.   Completed without difficulty   Pain immediately resolved suggesting accurate placement of the medication.   Advised to call if fevers/chills, erythema, induration, drainage, or persistent bleeding.   Images permanently stored and  available for review in the ultrasound unit.  Impression: Technically successful ultrasound guided injection.         Assessment and Plan: 63 y.o. female with right lateral hip pain due to bursitis and tendinitis.  She does have a fair amount of arthritis visible on x-ray of the right hip however her pain is more lateral than anterior which would be not typical for arthritis.  She has had some improvement with PT.  Plan for greater trochanter bursa injection today and continued PT.  If not better enough next step would be MRI of the hip without contrast.  Lateral epicondylitis of the right elbow has improved considerably with PT.  Plan to continue home exercise program. PDMP not reviewed this encounter. Orders Placed This Encounter  Procedures   US  LIMITED JOINT SPACE STRUCTURES LOW RIGHT(NO LINKED CHARGES)    Reason for Exam (SYMPTOM  OR DIAGNOSIS REQUIRED):   right hip pain    Preferred imaging location?:   [facility]   No orders of the defined types were placed in this encounter.    Discussed warning signs or symptoms. Please see discharge instructions. Patient expresses understanding.   The above documentation has been reviewed and is accurate and complete Artist Lloyd, M.D.

## 2023-10-25 NOTE — Patient Instructions (Addendum)
 Thank you for coming in today.   You received an injection today. Seek immediate medical attention if the joint becomes red, extremely painful, or is oozing fluid.   Check back as needed.   If not better, let us  know, and we will order a MRI

## 2023-10-29 ENCOUNTER — Other Ambulatory Visit: Payer: Self-pay | Admitting: Family

## 2023-11-01 ENCOUNTER — Ambulatory Visit: Admitting: Physical Therapy

## 2023-11-04 ENCOUNTER — Ambulatory Visit: Attending: Family Medicine

## 2023-11-04 DIAGNOSIS — R2689 Other abnormalities of gait and mobility: Secondary | ICD-10-CM | POA: Diagnosis not present

## 2023-11-04 DIAGNOSIS — M25551 Pain in right hip: Secondary | ICD-10-CM | POA: Insufficient documentation

## 2023-11-04 DIAGNOSIS — M6281 Muscle weakness (generalized): Secondary | ICD-10-CM | POA: Diagnosis not present

## 2023-11-04 DIAGNOSIS — M25521 Pain in right elbow: Secondary | ICD-10-CM | POA: Insufficient documentation

## 2023-11-04 NOTE — Therapy (Signed)
 OUTPATIENT PHYSICAL THERAPY TREATMENT     Patient Name: Sherri Sullivan MRN: [id number] DOB:05/24/1960, 63 y.o., female Today's Date: 11/04/2023  END OF SESSION:  PT End of Session - 11/04/23 0954     Visit Number 8    Date for PT Re-Evaluation 12/13/23    Authorization Type [employer]    Authorization Time Period Prior authorization not required    Progress Note Due on Visit 17   MD PN for 10/25/23 on visit #7 (10/24/23)   PT Start Time 0935    PT Stop Time 1015    PT Time Calculation (min) 40 min    Activity Tolerance Patient tolerated treatment well    Behavior During Therapy [facility] for tasks assessed/performed                Past Medical History:  Diagnosis Date   Anginal pain (HCC)    Arthritis    L shoulder- has had injections, degenerative changes in lumbar spine    Back pain    Chicken pox as a child   Constipation    Dizziness    Dysrhythmia 2013   SVT- treated by ablation by Dr. Waddell to f/u with as needed basis    Encounter for preventative adult health care exam with abnormal findings 02/09/2015   HTN (hypertension)    Hypokalemia 02/09/2015   Joint pain    Leg edema    Measles as a child   Measles as a child   Obesity    Panic attack    during episode of feeling to crowded    Preventative health care 02/09/2015   Seasonal allergies    SOB (shortness of breath) 09/23/2011   a little bit; at rest; before ablation, SOB again now (08/2014- due to lack  of exercise)    SVT (supraventricular tachycardia) (HCC)    Uterine fibroid 12/17/2012   Cervical polyp per patient Follows with [facility], Dr Tanda Mulch   Past Surgical History:  Procedure Laterality Date   ABDOMINAL HYSTERECTOMY  1990's   CARDIAC ELECTROPHYSIOLOGY STUDY AND ABLATION  09/23/11   MAXIMUM ACCESS (MAS)POSTERIOR LUMBAR INTERBODY FUSION (PLIF) 1 LEVEL N/A 09/24/2014   Procedure: L5-S1 MAS PLIF ;  Surgeon: Fairy Levels, MD;  Location: [facility];  Service:  Neurosurgery;  Laterality: N/A;  L5-S1 MAS PLIF fusion   SUPRAVENTRICULAR TACHYCARDIA ABLATION N/A 09/23/2011   Procedure: SUPRAVENTRICULAR TACHYCARDIA ABLATION;  Surgeon: Danelle LELON Waddell, MD;  Location: [facility] CATH LAB;  Service: Cardiovascular;  Laterality: N/A;   TUBAL LIGATION  1980's   Patient Active Problem List   Diagnosis Date Noted   Severe sleep apnea 11/15/2022   Carpal tunnel syndrome 11/01/2022   Numbness of hand 11/01/2022   Cervical spondylosis 11/01/2022   Cervical radiculopathy 11/01/2022   Needs sleep apnea assessment 08/16/2022   H/O colonoscopy with polypectomy 08/16/2022   History of COVID-19 12/06/2021   Dyspnea 11/27/2020   DDD (degenerative disc disease), cervical 11/27/2020   Atypical chest pain 01/18/2019   Acute left-sided thoracic back pain 01/18/2019   Hyperglycemia 01/09/2018   Colon polyp 08/29/2017   Pain in right knee 06/28/2017   Pain in left knee 06/28/2017   Vitamin D  deficiency 02/25/2017   Polyneuropathy 01/21/2016   Hypokalemia 02/09/2015   Encounter for preventative adult health care exam with abnormal findings 02/09/2015   Preventative health care 02/09/2015   Spondylolisthesis of lumbar region 09/24/2014   Spinal stenosis 05/22/2014   Spondylolisthesis at L5-S1 level 05/22/2014  Lumbar radiculopathy 05/22/2014   Neck pain 04/08/2014   Muscle cramps 01/13/2014   Tendinopathy of rotator cuff 11/28/2013   Essential hypertension, benign 11/28/2013   Urinary frequency 08/17/2013   Uterine fibroid 12/17/2012   Obesity    SVT (supraventricular tachycardia) (HCC) 09/21/2011   LOW BACK PAIN 01/04/2008   Hyperlipidemia 02/28/2007   Allergic rhinitis 02/28/2007   KNEE PAIN, LEFT 02/28/2007    PCP: Domenica Harlene LABOR, MD   REFERRING PROVIDER: Joane Artist RAMAN, MD   REFERRING DIAG:  [phone] (ICD-10-CM) - Right elbow pain  M25.551 (ICD-10-CM) - Right hip pain   THERAPY DIAG:  Pain in right hip  Pain in right elbow  Muscle weakness  (generalized)  Other abnormalities of gait and mobility  RATIONALE FOR EVALUATION AND TREATMENT: Rehabilitation  ONSET DATE: R hip ~1 year; R elbow 3-4 months  NEXT MD VISIT: 10/25/2023   SUBJECTIVE:   SUBJECTIVE STATEMENT: The cortizone shot, she got last week, really helped with hip pain. She is going to help her granddaughter move into her dorm on Sunday.  EVAL:  Pt reports her R elbow has been getting better since she saw the MD - had been hurting for ~3 months prior to seeing MD.  She has been wearing elbow brace recommended by MD but not sure if she is positioning it properly as it continues to slide down her arm (does not have brace with her on eval).  Pain localized to R lateral epicondyle.  Still has difficulty lifting things with R hand.  R hip has been hurting closer to 1 year and continues to worsen.  Pain originates at lateral hip and radiates into R groin and anterior thigh, but denies numbness or tingling.  She has difficulty lifting her R foot in/out of a car.  PAIN: Are you having pain? Yes: NPRS scale: 4/10   Pain location: R lateral hip into groin and R anterior thigh  Pain description: stabbing  Aggravating factors: prolonged standing or walking, climbing stairs, putting on socks, rolling over in bed  Relieving factors: nothing so far (tried heat and ice), maybe rubbing motion when applying Aspercreme   Are you having pain? No - lateral epicondyle of R elbow  PERTINENT HISTORY: Arthritis, HTN, s/p R CTR 2024, lumbar spondylolisthesis with radiculopathy, s/p L5-S1 MAS PLIF 2016, cervical spondylosis/DDD, cervical radiculopathy, dizziness, LE edema, RTC tendinopathy, obesity, SVT s/p ablation, h/o B knee pain  PRECAUTIONS: None  HAND DOMINANCE: Right  RED FLAGS: None  WEIGHT BEARING RESTRICTIONS: No  FALLS:  Has patient fallen in last 6 months? No  LIVING ENVIRONMENT: Lives with: lives with their spouse Lives in: House/apartment Stairs: Yes: Internal: 14  x 2 steps; on right going up and External: 3+4 steps; none Has following equipment at home: None  OCCUPATION: Retired  PLOF: Independent and Leisure: binge watching Netflix, light gardening   PATIENT GOALS: Better mobility with less pain.   OBJECTIVE: (objective measures completed at initial evaluation unless otherwise dated)  DIAGNOSTIC FINDINGS:  08/30/23 - Diagnostic Limited MSK Ultrasound of: Right lateral elbow Intact common extensor tendon origin at lateral epicondyle.  Small avulsion fleck present at superficial portion of lateral epicondyle. Increased vascular activity on Doppler consistent with neovascularity. No visible tears present in the tendon. Impression: Lateral epicondylitis  08/30/23 - DG hip IMPRESSION: Moderate to severe right hip degenerative change.  PATIENT SURVEYS:  Quick Dash: 43.2 / 100 = 43.2 % LEFS: 19 / 80 = 23.8 %, moderate functional limitation  COGNITION: Overall cognitive  status: Within functional limits for tasks assessed     SENSATION: WFL  EDEMA:  N/A  POSTURE:  rounded shoulders, forward head, flexed trunk , and weight shift left  PALPATION: Increased muscle tension with TTP over R glutes and piriformis.  Mildly TTP over R greater trochanter.  TTP over R wrist extensor group and lateral epicondyle.  UPPER EXTREMITY ROM:  Grossly [facility]  UPPER EXTREMITY MMT:  MMT Right eval Left eval R 10/24/23 L 10/24/23  Shoulder flexion 4- 4 4+ 4+  Shoulder extension 4 4 4+ 4+  Shoulder abduction 4- 4+ 4+ 4+  Shoulder adduction      Shoulder internal rotation 4+ 4+ 5 5  Shoulder external rotation 4- 4 4+ 4+  Middle trapezius 3+ 4- 4 4  Lower trapezius 3- 3- 3- 3-  Elbow flexion 4- 4+ 4+ 5  Elbow extension 4- 4- 4+ 4+  Wrist flexion      Wrist extension      Wrist ulnar deviation      Wrist radial deviation      Wrist pronation      Wrist supination      Grip strength (lbs) 13.67# 26.33# 24.33# 29.33#  (Blank rows = not tested)  MUSCLE  LENGTH: Hamstrings: mildly tight L>R ITB: mod tight B Piriformis: severe tight R>L Hip flexors: mod tight B Quads: mild tight B Heelcord:   LOWER EXTREMITY ROM:  Active ROM Right eval Left eval R 10/24/23 L 10/24/23 R 11/04/23  Hip flexion 74 91 82 94 90  Hip extension       Hip abduction       Hip adduction       Hip internal rotation 39 44 38 44   Hip external rotation 7 27 20  32 15  Knee flexion       Knee extension       Ankle dorsiflexion       Ankle plantarflexion       Ankle inversion       Ankle eversion        (Blank rows = not tested)  LOWER EXTREMITY MMT:  MMT Right eval Left eval R 10/24/23 L 10/24/23  Hip flexion 3+ p! 4- 4- p! 4+  Hip extension 3- 3- 3+ 3+  Hip abduction 3- 3+ 3+ 4  Hip adduction 2+ 3- 3+ limited ROM 4-  Hip internal rotation 4 4+ 4+ 4+  Hip external rotation 2 4- 4- limited ROM 4 limited ROM  Knee flexion 4- 4 4 4   Knee extension 4- 4 4+ 4+  Ankle dorsiflexion 4- 4 4+ 4+  Ankle plantarflexion      Ankle inversion      Ankle eversion       (Blank rows = not tested)  LOWER EXTREMITY SPECIAL TESTS:  Hip special tests: Belvie (FABER) test: positive , Thomas test: positive , and Ober's test: negative  FUNCTIONAL TESTS:  5 times sit to stand: 27.40 sec  TRANSFERS: Assistive device utilized: None  Sit to stand: Complete Independence and Modified independence -typically relies on UE assist but able to complete sit to stand without UE assist although tendency for weight shift to L with R genu valgum Stand to sit: Complete Independence and Modified independence Chair to chair: Modified independence Floor: NT   TODAY'S TREATMENT: 11/04/2023  THERAPEUTIC EXERCISE: To improve strength, ROM, and flexibility.  Demonstration, verbal and tactile cues throughout for technique. NuStep L5 x 6 min  Seated piriformis stretch on 9' stool 2x30  Seated gluteal lunge stretch on 9' stool 2x30  THERAPEUTIC ACTIVITIES: To improve functional performance.   Demonstration, verbal and tactile cues throughout for technique. Stairs assessed; ascending pt leans to L side and pulls herself up on RLE; no issues with descending Standing hip abduction x 20 BLE; YTB at ankles x 10 Standing hip extension 10 BLE; YTB at ankles x 10  Side stepping YTB at ankles 3x along counter Standing marching YTB at forefoot x 10 BLE Fwd stepping over pool noodle x 15 BLE- R foot started to drag Lateral stepping over pool noodle x 15 BLE  10/24/2023  THERAPEUTIC EXERCISE: To improve strength, ROM, and flexibility.  Demonstration, verbal and tactile cues throughout for technique. NuStep L3 x 6 min  Hooklying windshield wipers (B hip IR/ER) x 20 L S/L R hip ER clam x 10  THERAPEUTIC ACTIVITIES: To improve functional performance.  Demonstration, verbal and tactile cues throughout for technique. Hip ROM assessment UE/LE MMT   MANUAL THERAPY: To promote improved flexibility, improved joint mobility, increased ROM, pain modulation, and reduced pain utilizing joint mobilization. R hip grade II hip A/P mobs - increased pain R hip grade II-III distraction MWM for R hip ER   10/20/2023  THERAPEUTIC EXERCISE: To improve strength, ROM, and flexibility.  Demonstration, verbal and tactile cues throughout for technique. Nustep L3x53min  LTR with orange pball x 10 B DKTC orange pball x 10 B Bridges 3x10 with RTB hip ABD  SELF CARE HOME MANAGEMENT: to review HEP and ensure proper/safe performance of exercises Seated hip ABD RTB unilateral 2x10 each side Seated marching RTB X 10 B - fatigue in R hip Seated HS stretch x 30 RLE Seated piriformis stretch leg on 9' stool x 30 RLE Seated hip flexor stretch BLE x 30 Standing ITB stretch x 30 RLE   10/18/2023  THERAPEUTIC EXERCISE: To improve strength, ROM, and flexibility.  Demonstration, verbal and tactile cues throughout for technique. Rec Bike - L1 x 6 min Seated R figure-4 piriformis stretch with foot positioned on wooden  box x 30 (had to elevated treatment table, therefore may need to continue with 9 stool for a while more) Standing hip ABD 3 x 10 bil, UE support on wall ladder for balance Seated hip ADD isometric ball squeeze 10 x 3, 3 sets  NEUROMUSCULAR RE-EDUCATION: To improve balance, coordination, and reduce fall risk.  Hooklying TrA alt unilateral bent-knee fallout 2 x 10 Bridge + B hip ABD clam x 10 Seated alt hip flexion march with arms outstretched 2 x 60, 2nd set with 3# ankle weights  L unilateral heel raises x 8 with UE support on wall, unable on R  THERAPEUTIC ACTIVITIES: To improve functional performance.  Demonstration, verbal and tactile cues throughout for technique.  Sit <> stand from chair 2 x 10 - intermittently with UE assist from armrests, although some reps with arms crossed on chest - easier to complete sit to stand rise with more difficulty noted on eccentric lowering   10/11/2023  THERAPEUTIC EXERCISE: To improve strength, endurance, ROM, and flexibility.  Demonstration, verbal and tactile cues throughout for technique.  NuStep - L4 x 6 min (UE/LE to promote muscle warm-up and tissue perfusion) Seated R wrist extensor stretch x 30 Seated R wrist flexor stretch x 30 Seated R forearm pronation/supination 2# 10 x 5 Seated R eccentric wrist extension 2# x 10 Seated R eccentric wrist flexion 2# x 10 Yellow Thera-Putty gross grip and twist grip Seated B wrist flexion/extension roll-up with RTB  attached to PVC pipe   10/05/2023  THERAPEUTIC EXERCISE: To improve strength, endurance, ROM, and flexibility.  Demonstration, verbal and tactile cues throughout for technique.  Rec Bike - L1 x 6 min Seated lunge position R hip flexor stretch 3 x 30 Seated R hip hinge HS stretch with foot on floor 2 x 30 Seated R figure-4 piriformis stretch with foot positioned on 9 stool 2 x 30 Standing R glute stretch with foot on wooden box 2 x 30 Seated B hip ADD ball squeeze isometric 10 x 5,  2 sets Seated RTB B hip ABD/ER clam 10 x 3-5, 2 sets Seated RTB hip flexion march x 10, cues not to lean back when lifting R LE  MANUAL THERAPY: To promote normalized muscle tension, improved flexibility, improved joint mobility, increased ROM, and reduced pain utilizing connective tissue massage, therapeutic massage, and manual TP therapy. STM/DTM, manual TPR, and IASTM with foam roller to R iliopsoas, TFL and RF  SELF CARE: Provided education to prevent loss of gains achieved with physical therapy.  Provided instruction in self-STM techniques to R hip flexors using tennis ball, foam roller or rolling pin.    10/03/2023 THERAPEUTIC EXERCISE: To improve strength, endurance, ROM, and flexibility.  Demonstration, verbal and tactile cues throughout for technique.  NuStep - L4 x 6 min (UE/LE to promote muscle warm-up and tissue perfusion) Supine R HS stretch with strap x 30 Hooklying R HS stretch with strap x 30 - preferred  Supine R ITB stretch with strap x 30 - painful Unable to achieve R SKTC or R bent knee glute stretch position in supine  Unable to achieve R figure-4 or KTOS piriformis stretch position in hooklying  Mod thomas R quad/hip flexor stretch with strap 2 x 30 Unable to achieve R side-sitting figure-4/pigeon piriformis stretch position in sitting Seated R figure-4 piriformis stretch with foot positioned on 9 stool 2 x 30 Standing R glute stretch with foot on wooden box 2 x 30 Seated B hip ADD ball squeeze isometric 10 x 5, 2 sets Seated RTB B hip ABD/ER clam 10 x 3-5, 2 sets Seated RTB hip flexion march x 10, cues not to lean back when lifting R LE   09/20/2023 - Eval SELF CARE:  Reviewed eval findings and role of PT in addressing identified deficits as well as instruction in ice massage for R lateral epicondylitis (see below).    PATIENT EDUCATION:  Education details: progress with PT, ongoing PT POC, and HEP update  Person educated: Patient Education method:  Explanation, Demonstration, Verbal cues, Tactile cues, and MedBridgeGO app updated Education comprehension: verbalized understanding, returned demonstration, verbal cues required, tactile cues required, and needs further education  HOME EXERCISE PROGRAM: *Pt using MedBridge[employer] app  Access Code: [email or web address] URL: [email or web address] Date: 11/04/2023 Prepared by: Alexiana Laverdure  Exercises - Seated Hip Flexor Stretch  - 2-3 x daily - 7 x weekly - 3 reps - 30 sec hold - Seated Hamstring Stretch  - 2-3 x daily - 7 x weekly - 3 reps - 30 sec hold - Seated Piriformis Stretch  - 2-3 x daily - 7 x weekly - 3 reps - 30 sec hold - Standing Gluteal Stretch on Chair  - 2-3 x daily - 7 x weekly - 3 reps - 30 sec hold - Standing ITB Stretch (Mirrored)  - 2-3 x daily - 7 x weekly - 3 reps - 30 sec hold - Seated Hip Adduction Squeeze with Ball  - 1 x daily - 7  x weekly - 2 sets - 10 reps - 3-5 sec hold - Seated Hip Abduction with Resistance  - 1 x daily - 7 x weekly - 2 sets - 10 reps - 3 sec hold - Seated March with Resistance  - 1 x daily - 7 x weekly - 2 sets - 10 reps - 3 sec hold - Seated Wrist Extension Stretch  - 2 x daily - 7 x weekly - 3 reps - 30 sec hold - Seated Wrist Flexion Stretch  - 2 x daily - 7 x weekly - 3 reps - 30 sec hold - Putty Squeezes  - 1 x daily - 7 x weekly - 2 sets - 10 reps - 3 sec hold - Seated Eccentric Wrist Flexion with Dumbbell  - 1 x daily - 3 x weekly - 2 sets - 10 reps - 3 sec hold - Seated Eccentric Wrist Extension  - 1 x daily - 3 x weekly - 2 sets - 10 reps - 3 sec hold - Seated Wrist Supination Pronation with Can  - 1 x daily - 3 x weekly - 2 sets - 10 reps - 3 sec hold - Wrist Flexion and Extension with Resistance Bar  - 1 x daily - 3 x weekly - 2 sets - 10 reps - 3 sec hold - Supine Hip Internal and External Rotation  - 1 x daily - 7 x weekly - 2 sets - 10 reps - 3 sec hold - Clamshell  - 1 x daily - 3 x weekly - 2 sets - 10 reps - 3 sec hold -  Standing Hip Abduction with Resistance at Ankles and Counter Support  - 1 x daily - 3-4 x weekly - 2 sets - 10 reps - Standing Hip Extension with Resistance at Ankles and Counter Support  - 1 x daily - 3-4 x weekly - 2 sets - 10 reps - Side Stepping with Resistance at Ankles and Counter Support  - 1 x daily - 3-4 x weekly - 2 sets - 10 reps - Marching with Resistance  - 1 x daily - 3-4 x weekly - 2 sets - 10 reps  Patient Education - Ice Massage   ASSESSMENT:  CLINICAL IMPRESSION: Added more standing exercises today. Pt doing better with  stairs, subjectively but she does still show some compensation. She becomes fatigued pretty quickly in RLE, as her R foot tends to catch during session with stepping exercises. Pt has met STG #3 for hip flexion and ER. Nanetta is progressing well towards her PT goals and will benefit from continued skilled PT to address her ongoing deficits in ROM, strength, and flexibility to improve her mobility and activity tolerance w/ decreased pain interference.   EVAL:  ALKA FALWELL is a 63 y.o. female who was referred to physical therapy for evaluation and treatment for R hip and R elbow pain.  Patient reports onset of R hip pain beginning ~1+ year ago.  Pain is worse with prolonged standing or walking, climbing stairs, putting on socks, rolling over in bed and limits sleeping.  R elbow pain with more recent onset ~3-4 months ago and attributed to secondary to lateral epicondylitis.  Pain is worse with repetitive motion - folding laundry, working/playing on computer, lifting heavy items.  Patient has deficits in R hip ROM, proximal LE flexibility, R>L B LE, UE and grip strength, abnormal posture, and TTP with abnormal muscle tension which are interfering with ADLs and are impacting quality of  life.  On LEFS patient scored 19/80 demonstrating moderate functional limitation.  On QuickDASH patient scored 43.2/100 demonstrating 43.2% disability.  Aleanna will benefit from skilled PT to  address above deficits to improve mobility and activity tolerance with decreased pain interference.  OBJECTIVE IMPAIRMENTS: Abnormal gait, decreased activity tolerance, decreased balance, decreased endurance, decreased knowledge of condition, decreased mobility, difficulty walking, decreased ROM, decreased strength, increased fascial restrictions, impaired perceived functional ability, increased muscle spasms, impaired flexibility, impaired UE functional use, improper body mechanics, postural dysfunction, and pain.   ACTIVITY LIMITATIONS: carrying, lifting, sitting, standing, squatting, sleeping, stairs, transfers, bed mobility, dressing, and locomotion level  PARTICIPATION LIMITATIONS: meal prep, cleaning, laundry, driving, shopping, and community activity  PERSONAL FACTORS: Fitness, Past/current experiences, Time since onset of injury/illness/exacerbation, and 3+ comorbidities: Arthritis, HTN, s/p R CTR 2024, lumbar spondylolisthesis with radiculopathy, s/p L5-S1 MAS PLIF 2016, cervical spondylosis/DDD, cervical radiculopathy, dizziness, LE edema, RTC tendinopathy, obesity, SVT s/p ablation, h/o B knee pain are also affecting patient's functional outcome.   REHAB POTENTIAL: Good  CLINICAL DECISION MAKING: Unstable/unpredictable  EVALUATION COMPLEXITY: High   GOALS: Goals reviewed with patient? Yes  SHORT TERM GOALS: Target date: 11/01/2023  Patient will be independent with initial HEP. Baseline:  10/03/23 - initial LE HEP provided today 10/05/23 - HEP reviewed and modified to improve patient tolerance 10/20/23 - MET for LE HEP Goal status: MET - 10/24/23  2.  Patient will report at least 25% improvement in R hip and elbow pain to improve QOL. Baseline: R hip - 9/10 on eval, up to 10/10 at worst; R elbow - 3/10 on eval, up to 9/10 at worst 10/03/23 - no R elbow pain today Goal status: MET - 10/24/23 - 90% improvement in he R elbow; 40-50% improvement in her R hip  3.  Patient will improve R  hip flexion AROM to >/= 90 and R hip ER AROM to >/= 15 to increase ease of car transfers. Baseline: Refer to above LE ROM table Goal status: MET - 10/24/23 - R hip flexion 90, ER 15  LONG TERM GOALS: Target date: 12/13/2023   Patient will be independent with advanced/ongoing HEP to improve outcomes and carryover.  Baseline:  Goal status: IN PROGRESS - 10/24/23 - Met for current HEP, anticipate further updates  2.  Patient will report at least 50-75% improvement in R hip and elbow pain to improve QOL. Baseline: R hip - 9/10 on eval, up to 10/10 at worst; R elbow - 3/10 on eval, up to 9/10 at worst Goal status: IN PROGRESS - 10/24/23 - 90% improvement in he R elbow; 40-50% improvement in her R hip  3.  Patient will demonstrate functional pain free R hip ROM to perform ADLs.   Baseline: Refer to above LE ROM table Goal status: IN PROGRESS - 10/24/23 - still limited in most motions  4.  Patient will demonstrate improved B UE and scapular strength to >/= 4 to 4+/5 for functional UE use. Baseline: Refer to above UE MMT table Goal status: IN PROGRESS - 10/24/23 - met except lower traps 3-/5  5.  Patient will demonstrate improved B LE strength to >/= 4 to 4+/5 for improved stability and ease of mobility. Baseline: Refer to above LE MMT table Goal status: IN PROGRESS - 10/24/23  6.  Patient will be able to ambulate with normal gait pattern without increased pain and be able to ascend/descend stairs with 1 HR and reciprocal step pattern safely to access home and community.  Baseline:  Goal status: IN PROGRESS- 11/04/23 see under treatment  7. Patient will report >/= 40/80 on LEFS to demonstrate improved functional ability. Baseline: 19 / 80 = 23.8 % Goal status: INITIAL  8.  Patient will report </= 29% on QuickDASH to demonstrate improved functional ability. Baseline: 43.2 / 100 = 43.2 % Goal status: INITIAL  9.  Patient will report <25% sleep disturbance due to pain. Baseline: Both R hip and  elbow pain interfere with sleep Goal status: IN PROGRESS - 10/24/23 - pain interferes with sleep almost every night 20-30%   PLAN:  PT FREQUENCY: 2x/week  PT DURATION: 12 weeks  PLANNED INTERVENTIONS: 97164- PT Re-evaluation, 97750- Physical Performance Testing, 97110-Therapeutic exercises, 97530- Therapeutic activity, 97112- Neuromuscular re-education, 97535- Self Care, [id number]- Manual therapy, [phone]- Gait training, [phone]- Aquatic Therapy, [phone]- Electrical stimulation (unattended), [phone]- Ultrasound, [id number]- Ionotophoresis 4mg /ml Dexamethasone , 79439 (1-2 muscles), [id number] (3+ muscles)- Dry Needling, Patient/Family education, Balance training, Stair training, Taping, Joint mobilization, Spinal mobilization, DME instructions, Cryotherapy, and Moist heat  PLAN FOR NEXT SESSION: Continue to incorporate LE and core/lumbopelvic strengthening - update HEP as indicated; progress proximal LE stretching and R hip ROM; MT +/- TPDN and/or modalities to address abnormal muscle tension and pain - possible trial of ionto patch to R greater trochanter; review initial HEP for UE flexibility and strengthening PRN   Schneur Crowson L Jujuan Dugo, PTA 11/04/2023, 10:16 AM

## 2023-11-07 ENCOUNTER — Encounter: Payer: Self-pay | Admitting: Physical Therapy

## 2023-11-07 ENCOUNTER — Ambulatory Visit: Admitting: Physical Therapy

## 2023-11-07 DIAGNOSIS — M25551 Pain in right hip: Secondary | ICD-10-CM

## 2023-11-07 DIAGNOSIS — R2689 Other abnormalities of gait and mobility: Secondary | ICD-10-CM

## 2023-11-07 DIAGNOSIS — M25521 Pain in right elbow: Secondary | ICD-10-CM

## 2023-11-07 DIAGNOSIS — G4733 Obstructive sleep apnea (adult) (pediatric): Secondary | ICD-10-CM | POA: Diagnosis not present

## 2023-11-07 DIAGNOSIS — M6281 Muscle weakness (generalized): Secondary | ICD-10-CM

## 2023-11-07 NOTE — Therapy (Signed)
 OUTPATIENT PHYSICAL THERAPY TREATMENT     Patient Name: ROBINN OVERHOLT MRN: [id number] DOB:Apr 09, 1960, 63 y.o., female Today's Date: 11/07/2023  END OF SESSION:  PT End of Session - 11/07/23 1147     Visit Number 9    Date for PT Re-Evaluation 12/13/23    Authorization Type [employer]    Authorization Time Period Prior authorization not required    Progress Note Due on Visit 18    PT Start Time 1146    PT Stop Time 1226    PT Time Calculation (min) 40 min    Activity Tolerance Patient tolerated treatment well    Behavior During Therapy WFL for tasks assessed/performed                Past Medical History:  Diagnosis Date   Anginal pain (HCC)    Arthritis    L shoulder- has had injections, degenerative changes in lumbar spine    Back pain    Chicken pox as a child   Constipation    Dizziness    Dysrhythmia 2013   SVT- treated by ablation by Dr. Waddell to f/u with as needed basis    Encounter for preventative adult health care exam with abnormal findings 02/09/2015   HTN (hypertension)    Hypokalemia 02/09/2015   Joint pain    Leg edema    Measles as a child   Measles as a child   Obesity    Panic attack    during episode of feeling to crowded    Preventative health care 02/09/2015   Seasonal allergies    SOB (shortness of breath) 09/23/2011   a little bit; at rest; before ablation, SOB again now (08/2014- due to lack  of exercise)    SVT (supraventricular tachycardia) (HCC)    Uterine fibroid 12/17/2012   Cervical polyp per patient Follows with [facility], Dr Tanda Mulch   Past Surgical History:  Procedure Laterality Date   ABDOMINAL HYSTERECTOMY  1990's   CARDIAC ELECTROPHYSIOLOGY STUDY AND ABLATION  09/23/11   MAXIMUM ACCESS (MAS)POSTERIOR LUMBAR INTERBODY FUSION (PLIF) 1 LEVEL N/A 09/24/2014   Procedure: L5-S1 MAS PLIF ;  Surgeon: Fairy Levels, MD;  Location: [facility];  Service: Neurosurgery;  Laterality: N/A;  L5-S1 MAS PLIF  fusion   SUPRAVENTRICULAR TACHYCARDIA ABLATION N/A 09/23/2011   Procedure: SUPRAVENTRICULAR TACHYCARDIA ABLATION;  Surgeon: Danelle LELON Waddell, MD;  Location: [facility] C[facility];  Service: Cardiovascular;  Laterality: N/A;   TUBAL LIGATION  1980's   Patient Active Problem List   Diagnosis Date Noted   Severe sleep apnea 11/15/2022   Carpal tunnel syndrome 11/01/2022   Numbness of hand 11/01/2022   Cervical spondylosis 11/01/2022   Cervical radiculopathy 11/01/2022   Needs sleep apnea assessment 08/16/2022   H/O colonoscopy with polypectomy 08/16/2022   History of COVID-19 12/06/2021   Dyspnea 11/27/2020   DDD (degenerative disc disease), cervical 11/27/2020   Atypical chest pain 01/18/2019   Acute left-sided thoracic back pain 01/18/2019   Hyperglycemia 01/09/2018   Colon polyp 08/29/2017   Pain in right knee 06/28/2017   Pain in left knee 06/28/2017   Vitamin D  deficiency 02/25/2017   Polyneuropathy 01/21/2016   Hypokalemia 02/09/2015   Encounter for preventative adult health care exam with abnormal findings 02/09/2015   Preventative health care 02/09/2015   Spondylolisthesis of lumbar region 09/24/2014   Spinal stenosis 05/22/2014   Spondylolisthesis at L5-S1 level 05/22/2014   Lumbar radiculopathy 05/22/2014   Neck pain 04/08/2014  Muscle cramps 01/13/2014   Tendinopathy of rotator cuff 11/28/2013   Essential hypertension, benign 11/28/2013   Urinary frequency 08/17/2013   Uterine fibroid 12/17/2012   Obesity    SVT (supraventricular tachycardia) (HCC) 09/21/2011   LOW BACK PAIN 01/04/2008   Hyperlipidemia 02/28/2007   Allergic rhinitis 02/28/2007   KNEE PAIN, LEFT 02/28/2007    PCP: Domenica Harlene LABOR, MD   REFERRING PROVIDER: Joane Artist RAMAN, MD   REFERRING DIAG:  [phone] (ICD-10-CM) - Right elbow pain  M25.551 (ICD-10-CM) - Right hip pain   THERAPY DIAG:  Pain in right hip  Pain in right elbow  Muscle weakness (generalized)  Other abnormalities of gait and  mobility  RATIONALE FOR EVALUATION AND TREATMENT: Rehabilitation  ONSET DATE: R hip ~1 year; R elbow 3-4 months  NEXT MD VISIT: No upcoming appt scheduled at this time (11/07/23)   SUBJECTIVE:   SUBJECTIVE STATEMENT: Pt reports that her R hip pain has returned and it is about the same as it was before the shot. Pt helped her granddaughter move into dorms at [employer]. If shot doesn't work, will be scheduling MRI per doctor.   EVAL:  Pt reports her R elbow has been getting better since she saw the MD - had been hurting for ~3 months prior to seeing MD.  She has been wearing elbow brace recommended by MD but not sure if she is positioning it properly as it continues to slide down her arm (does not have brace with her on eval).  Pain localized to R lateral epicondyle.  Still has difficulty lifting things with R hand.  R hip has been hurting closer to 1 year and continues to worsen.  Pain originates at lateral hip and radiates into R groin and anterior thigh, but denies numbness or tingling.  She has difficulty lifting her R foot in/out of a car.  PAIN: Are you having pain? Yes: NPRS scale: 5/10   Pain location: R lateral hip into groin and R anterior thigh  Pain description: stabbing  Aggravating factors: prolonged standing or walking, climbing stairs, putting on socks, rolling over in bed  Relieving factors: nothing so far (tried heat and ice), maybe rubbing motion when applying Aspercreme   Are you having pain? No - lateral epicondyle of R elbow  PERTINENT HISTORY: Arthritis, HTN, s/p R CTR 2024, lumbar spondylolisthesis with radiculopathy, s/p L5-S1 MAS PLIF 2016, cervical spondylosis/DDD, cervical radiculopathy, dizziness, LE edema, RTC tendinopathy, obesity, SVT s/p ablation, h/o B knee pain  PRECAUTIONS: None  HAND DOMINANCE: Right  RED FLAGS: None  WEIGHT BEARING RESTRICTIONS: No  FALLS:  Has patient fallen in last 6 months? No  LIVING ENVIRONMENT: Lives with: lives with  their spouse Lives in: House/apartment Stairs: Yes: Internal: 14 x 2 steps; on right going up and External: 3+4 steps; none Has following equipment at home: None  OCCUPATION: Retired  PLOF: Independent and Leisure: binge watching Netflix, light gardening   PATIENT GOALS: Better mobility with less pain.   OBJECTIVE: (objective measures completed at initial evaluation unless otherwise dated)  DIAGNOSTIC FINDINGS:  08/30/23 - Diagnostic Limited MSK Ultrasound of: Right lateral elbow Intact common extensor tendon origin at lateral epicondyle.  Small avulsion fleck present at superficial portion of lateral epicondyle. Increased vascular activity on Doppler consistent with neovascularity. No visible tears present in the tendon. Impression: Lateral epicondylitis  08/30/23 - DG hip IMPRESSION: Moderate to severe right hip degenerative change.  PATIENT SURVEYS:  Quick Dash: 43.2 / 100 = 43.2 % LEFS:  19 / 80 = 23.8 %, moderate functional limitation  COGNITION: Overall cognitive status: Within functional limits for tasks assessed     SENSATION: WFL  EDEMA:  N/A  POSTURE:  rounded shoulders, forward head, flexed trunk , and weight shift left  PALPATION: Increased muscle tension with TTP over R glutes and piriformis.  Mildly TTP over R greater trochanter.  TTP over R wrist extensor group and lateral epicondyle.  UPPER EXTREMITY ROM:  Grossly [facility]  UPPER EXTREMITY MMT:  MMT Right eval Left eval R 10/24/23 L 10/24/23  Shoulder flexion 4- 4 4+ 4+  Shoulder extension 4 4 4+ 4+  Shoulder abduction 4- 4+ 4+ 4+  Shoulder adduction      Shoulder internal rotation 4+ 4+ 5 5  Shoulder external rotation 4- 4 4+ 4+  Middle trapezius 3+ 4- 4 4  Lower trapezius 3- 3- 3- 3-  Elbow flexion 4- 4+ 4+ 5  Elbow extension 4- 4- 4+ 4+  Wrist flexion      Wrist extension      Wrist ulnar deviation      Wrist radial deviation      Wrist pronation      Wrist supination      Grip strength (lbs)  13.67# 26.33# 24.33# 29.33#  (Blank rows = not tested)  MUSCLE LENGTH: Hamstrings: mildly tight L>R ITB: mod tight B Piriformis: severe tight R>L Hip flexors: mod tight B Quads: mild tight B Heelcord:   LOWER EXTREMITY ROM:  Active ROM Right eval Left eval R 10/24/23 L 10/24/23 R 11/04/23  Hip flexion 74 91 82 94 90  Hip extension       Hip abduction       Hip adduction       Hip internal rotation 39 44 38 44   Hip external rotation 7 27 20  32 15  Knee flexion       Knee extension       Ankle dorsiflexion       Ankle plantarflexion       Ankle inversion       Ankle eversion        (Blank rows = not tested)  LOWER EXTREMITY MMT:  MMT Right eval Left eval R 10/24/23 L 10/24/23 R 11/07/23 L 11/07/23  Hip flexion 3+ p! 4- 4- p! 4+ 4-; p!  4+  Hip extension 3- 3- 3+ 3+ 3+ 3+  Hip abduction 3- 3+ 3+ 4 4 4   Hip adduction 2+ 3- 3+ limited ROM 4- 3+; limited ROM 4-  Hip internal rotation 4 4+ 4+ 4+ 4+ 4+  Hip external rotation 2 4- 4- limited ROM 4 limited ROM 4+ 4+  Knee flexion 4- 4 4 4 4  4+  Knee extension 4- 4 4+ 4+ 4 4+  Ankle dorsiflexion 4- 4 4+ 4+ 4+ 4+  Ankle plantarflexion        Ankle inversion        Ankle eversion         (Blank rows = not tested)  LOWER EXTREMITY SPECIAL TESTS:  Hip special tests: Belvie (FABER) test: positive , Thomas test: positive , and Ober's test: negative  FUNCTIONAL TESTS:  5 times sit to stand: 27.40 sec  TRANSFERS: Assistive device utilized: None  Sit to stand: Complete Independence and Modified independence -typically relies on UE assist but able to complete sit to stand without UE assist although tendency for weight shift to L with R genu valgum Stand to sit: Complete Independence and  Modified independence Chair to chair: Modified independence Floor: NT   TODAY'S TREATMENT: 11/07/2023  THERAPEUTIC EXERCISE: To improve strength, ROM, and flexibility.   NuStep L5x6 min  Seated Piriformis Stretch w/ foot on 9 stool- 2x30 RLE  LE  MMT (LTG 5)  Side Stepping w/ RTB at distal thighs 2x20' Standing Hip Ext x15 B  Standing Hip Add x15 B THERAPEUTIC ACTIVITIES: To improve functional performance.  Demonstration, verbal and tactile cues throughout for technique. Functional Squats w/ 5# kb on 9 stool- lifting mechanics provided before  Fwd Step ups onto 4 2x1'- pt reports R hip feels wobbly on the step today    11/04/2023  THERAPEUTIC EXERCISE: To improve strength, ROM, and flexibility.  Demonstration, verbal and tactile cues throughout for technique. NuStep L5 x 6 min  Seated piriformis stretch on 9' stool 2x30 Seated gluteal lunge stretch on 9' stool 2x30  THERAPEUTIC ACTIVITIES: To improve functional performance.  Demonstration, verbal and tactile cues throughout for technique. Stairs assessed; ascending pt leans to L side and pulls herself up on RLE; no issues with descending Standing hip abduction x 20 BLE; YTB at ankles x 10 Standing hip extension 10 BLE; YTB at ankles x 10  Side stepping YTB at ankles 3x along counter Standing marching YTB at forefoot x 10 BLE Fwd stepping over pool noodle x 15 BLE- R foot started to drag Lateral stepping over pool noodle x 15 BLE  10/24/2023  THERAPEUTIC EXERCISE: To improve strength, ROM, and flexibility.  Demonstration, verbal and tactile cues throughout for technique. NuStep L3 x 6 min  Hooklying windshield wipers (B hip IR/ER) x 20 L S/L R hip ER clam x 10  THERAPEUTIC ACTIVITIES: To improve functional performance.  Demonstration, verbal and tactile cues throughout for technique. Hip ROM assessment UE/LE MMT   MANUAL THERAPY: To promote improved flexibility, improved joint mobility, increased ROM, pain modulation, and reduced pain utilizing joint mobilization. R hip grade II hip A/P mobs - increased pain R hip grade II-III distraction MWM for R hip ER   10/20/2023  THERAPEUTIC EXERCISE: To improve strength, ROM, and flexibility.  Demonstration, verbal and tactile  cues throughout for technique. Nustep L3x34min  LTR with orange pball x 10 B DKTC orange pball x 10 B Bridges 3x10 with RTB hip ABD  SELF CARE HOME MANAGEMENT: to review HEP and ensure proper/safe performance of exercises Seated hip ABD RTB unilateral 2x10 each side Seated marching RTB X 10 B - fatigue in R hip Seated HS stretch x 30 RLE Seated piriformis stretch leg on 9' stool x 30 RLE Seated hip flexor stretch BLE x 30 Standing ITB stretch x 30 RLE   10/18/2023  THERAPEUTIC EXERCISE: To improve strength, ROM, and flexibility.  Demonstration, verbal and tactile cues throughout for technique. Rec Bike - L1 x 6 min Seated R figure-4 piriformis stretch with foot positioned on wooden box x 30 (had to elevated treatment table, therefore may need to continue with 9 stool for a while more) Standing hip ABD 3 x 10 bil, UE support on wall ladder for balance Seated hip ADD isometric ball squeeze 10 x 3, 3 sets  NEUROMUSCULAR RE-EDUCATION: To improve balance, coordination, and reduce fall risk.  Hooklying TrA alt unilateral bent-knee fallout 2 x 10 Bridge + B hip ABD clam x 10 Seated alt hip flexion march with arms outstretched 2 x 60, 2nd set with 3# ankle weights  L unilateral heel raises x 8 with UE support on wall, unable on R  THERAPEUTIC ACTIVITIES: To improve functional performance.  Demonstration, verbal and tactile cues throughout for technique.  Sit <> stand from chair 2 x 10 - intermittently with UE assist from armrests, although some reps with arms crossed on chest - easier to complete sit to stand rise with more difficulty noted on eccentric lowering   10/11/2023  THERAPEUTIC EXERCISE: To improve strength, endurance, ROM, and flexibility.  Demonstration, verbal and tactile cues throughout for technique.  NuStep - L4 x 6 min (UE/LE to promote muscle warm-up and tissue perfusion) Seated R wrist extensor stretch x 30 Seated R wrist flexor stretch x 30 Seated R forearm  pronation/supination 2# 10 x 5 Seated R eccentric wrist extension 2# x 10 Seated R eccentric wrist flexion 2# x 10 Yellow Thera-Putty gross grip and twist grip Seated B wrist flexion/extension roll-up with RTB attached to PVC pipe   10/05/2023  THERAPEUTIC EXERCISE: To improve strength, endurance, ROM, and flexibility.  Demonstration, verbal and tactile cues throughout for technique.  Rec Bike - L1 x 6 min Seated lunge position R hip flexor stretch 3 x 30 Seated R hip hinge HS stretch with foot on floor 2 x 30 Seated R figure-4 piriformis stretch with foot positioned on 9 stool 2 x 30 Standing R glute stretch with foot on wooden box 2 x 30 Seated B hip ADD ball squeeze isometric 10 x 5, 2 sets Seated RTB B hip ABD/ER clam 10 x 3-5, 2 sets Seated RTB hip flexion march x 10, cues not to lean back when lifting R LE  MANUAL THERAPY: To promote normalized muscle tension, improved flexibility, improved joint mobility, increased ROM, and reduced pain utilizing connective tissue massage, therapeutic massage, and manual TP therapy. STM/DTM, manual TPR, and IASTM with foam roller to R iliopsoas, TFL and RF  SELF CARE: Provided education to prevent loss of gains achieved with physical therapy.  Provided instruction in self-STM techniques to R hip flexors using tennis ball, foam roller or rolling pin.    10/03/2023 THERAPEUTIC EXERCISE: To improve strength, endurance, ROM, and flexibility.  Demonstration, verbal and tactile cues throughout for technique.  NuStep - L4 x 6 min (UE/LE to promote muscle warm-up and tissue perfusion) Supine R HS stretch with strap x 30 Hooklying R HS stretch with strap x 30 - preferred  Supine R ITB stretch with strap x 30 - painful Unable to achieve R SKTC or R bent knee glute stretch position in supine  Unable to achieve R figure-4 or KTOS piriformis stretch position in hooklying  Mod thomas R quad/hip flexor stretch with strap 2 x 30 Unable to achieve R  side-sitting figure-4/pigeon piriformis stretch position in sitting Seated R figure-4 piriformis stretch with foot positioned on 9 stool 2 x 30 Standing R glute stretch with foot on wooden box 2 x 30 Seated B hip ADD ball squeeze isometric 10 x 5, 2 sets Seated RTB B hip ABD/ER clam 10 x 3-5, 2 sets Seated RTB hip flexion march x 10, cues not to lean back when lifting R LE   09/20/2023 - Eval SELF CARE:  Reviewed eval findings and role of PT in addressing identified deficits as well as instruction in ice massage for R lateral epicondylitis (see below).    PATIENT EDUCATION:  Education details: progress with PT, ongoing PT POC, and HEP update  Person educated: Patient Education method: Explanation, Demonstration, Verbal cues, Tactile cues, and MedBridgeGO app updated Education comprehension: verbalized understanding, returned demonstration, verbal cues required, tactile cues required, and  needs further education  HOME EXERCISE PROGRAM: *Pt using MedBridge[employer] app  Access Code: [email or web address] URL: [email or web address] Date: 11/04/2023 Prepared by: Braylin Clark  Exercises - Seated Hip Flexor Stretch  - 2-3 x daily - 7 x weekly - 3 reps - 30 sec hold - Seated Hamstring Stretch  - 2-3 x daily - 7 x weekly - 3 reps - 30 sec hold - Seated Piriformis Stretch  - 2-3 x daily - 7 x weekly - 3 reps - 30 sec hold - Standing Gluteal Stretch on Chair  - 2-3 x daily - 7 x weekly - 3 reps - 30 sec hold - Standing ITB Stretch (Mirrored)  - 2-3 x daily - 7 x weekly - 3 reps - 30 sec hold - Seated Hip Adduction Squeeze with Ball  - 1 x daily - 7 x weekly - 2 sets - 10 reps - 3-5 sec hold - Seated Hip Abduction with Resistance  - 1 x daily - 7 x weekly - 2 sets - 10 reps - 3 sec hold - Seated March with Resistance  - 1 x daily - 7 x weekly - 2 sets - 10 reps - 3 sec hold - Seated Wrist Extension Stretch  - 2 x daily - 7 x weekly - 3 reps - 30 sec hold - Seated Wrist Flexion Stretch  - 2 x  daily - 7 x weekly - 3 reps - 30 sec hold - Putty Squeezes  - 1 x daily - 7 x weekly - 2 sets - 10 reps - 3 sec hold - Seated Eccentric Wrist Flexion with Dumbbell  - 1 x daily - 3 x weekly - 2 sets - 10 reps - 3 sec hold - Seated Eccentric Wrist Extension  - 1 x daily - 3 x weekly - 2 sets - 10 reps - 3 sec hold - Seated Wrist Supination Pronation with Can  - 1 x daily - 3 x weekly - 2 sets - 10 reps - 3 sec hold - Wrist Flexion and Extension with Resistance Bar  - 1 x daily - 3 x weekly - 2 sets - 10 reps - 3 sec hold - Supine Hip Internal and External Rotation  - 1 x daily - 7 x weekly - 2 sets - 10 reps - 3 sec hold - Clamshell  - 1 x daily - 3 x weekly - 2 sets - 10 reps - 3 sec hold - Standing Hip Abduction with Resistance at Ankles and Counter Support  - 1 x daily - 3-4 x weekly - 2 sets - 10 reps - Standing Hip Extension with Resistance at Ankles and Counter Support  - 1 x daily - 3-4 x weekly - 2 sets - 10 reps - Side Stepping with Resistance at Ankles and Counter Support  - 1 x daily - 3-4 x weekly - 2 sets - 10 reps - Marching with Resistance  - 1 x daily - 3-4 x weekly - 2 sets - 10 reps  Patient Education - Ice Massage   ASSESSMENT:  CLINICAL IMPRESSION: Mar reports that her pain is back after the cortisone shot she had last week. She reports that she did not have any increase in physical activity to stir her pain back up over the weekend. Pt reports that she was not scheduled for any additional appointment with her provider but, she was told that if the shot does not work after a certain amount of time she will be scheduled to have  a MRI of the R hip. Reassessed LTGs #1 & #5, noted no significant decreases nor increases in B LE strength at this time (chart above). Pt has significant weakness in B hip ext, and R hip add. Continued adding more standing strengthening exercises. Pt stated that activities in today's session were challenging but did not increase her R hip pain which was  reported at a 5/10 at the beginning of today's session. Pt will continue to benefit from skill PT intervention to address current deficits to improve mobility and activity tolerance w/ dec'd pain interference.   EVAL:  SHILO PHILIPSON is a 63 y.o. female who was referred to physical therapy for evaluation and treatment for R hip and R elbow pain.  Patient reports onset of R hip pain beginning ~1+ year ago.  Pain is worse with prolonged standing or walking, climbing stairs, putting on socks, rolling over in bed and limits sleeping.  R elbow pain with more recent onset ~3-4 months ago and attributed to secondary to lateral epicondylitis.  Pain is worse with repetitive motion - folding laundry, working/playing on computer, lifting heavy items.  Patient has deficits in R hip ROM, proximal LE flexibility, R>L B LE, UE and grip strength, abnormal posture, and TTP with abnormal muscle tension which are interfering with ADLs and are impacting quality of life.  On LEFS patient scored 19/80 demonstrating moderate functional limitation.  On QuickDASH patient scored 43.2/100 demonstrating 43.2% disability.  Latanja will benefit from skilled PT to address above deficits to improve mobility and activity tolerance with decreased pain interference.  OBJECTIVE IMPAIRMENTS: Abnormal gait, decreased activity tolerance, decreased balance, decreased endurance, decreased knowledge of condition, decreased mobility, difficulty walking, decreased ROM, decreased strength, increased fascial restrictions, impaired perceived functional ability, increased muscle spasms, impaired flexibility, impaired UE functional use, improper body mechanics, postural dysfunction, and pain.   ACTIVITY LIMITATIONS: carrying, lifting, sitting, standing, squatting, sleeping, stairs, transfers, bed mobility, dressing, and locomotion level  PARTICIPATION LIMITATIONS: meal prep, cleaning, laundry, driving, shopping, and community activity  PERSONAL FACTORS:  Fitness, Past/current experiences, Time since onset of injury/illness/exacerbation, and 3+ comorbidities: Arthritis, HTN, s/p R CTR 2024, lumbar spondylolisthesis with radiculopathy, s/p L5-S1 MAS PLIF 2016, cervical spondylosis/DDD, cervical radiculopathy, dizziness, LE edema, RTC tendinopathy, obesity, SVT s/p ablation, h/o B knee pain are also affecting patient's functional outcome.   REHAB POTENTIAL: Good  CLINICAL DECISION MAKING: Unstable/unpredictable  EVALUATION COMPLEXITY: High   GOALS: Goals reviewed with patient? Yes  SHORT TERM GOALS: Target date: 11/01/2023  Patient will be independent with initial HEP. Baseline:  10/03/23 - initial LE HEP provided today 10/05/23 - HEP reviewed and modified to improve patient tolerance 10/20/23 - MET for LE HEP Goal status: MET - 10/24/23  2.  Patient will report at least 25% improvement in R hip and elbow pain to improve QOL. Baseline: R hip - 9/10 on eval, up to 10/10 at worst; R elbow - 3/10 on eval, up to 9/10 at worst 10/03/23 - no R elbow pain today Goal status: MET - 10/24/23 - 90% improvement in he R elbow; 40-50% improvement in her R hip  3.  Patient will improve R hip flexion AROM to >/= 90 and R hip ER AROM to >/= 15 to increase ease of car transfers. Baseline: Refer to above LE ROM table Goal status: MET - 10/24/23 - R hip flexion 90, ER 15  LONG TERM GOALS: Target date: 12/13/2023   Patient will be independent with advanced/ongoing HEP to improve outcomes and  carryover.  Baseline:  10/24/23 - Met for current HEP, anticipate further updates  Goal status: IN PROGRESS - 11/07/23- Met for current HEP   2.  Patient will report at least 50-75% improvement in R hip and elbow pain to improve QOL. Baseline: R hip - 9/10 on eval, up to 10/10 at worst; R elbow - 3/10 on eval, up to 9/10 at worst Goal status: IN PROGRESS - 10/24/23 - 90% improvement in he R elbow; 40-50% improvement in her R hip  3.  Patient will demonstrate functional  pain free R hip ROM to perform ADLs.   Baseline: Refer to above LE ROM table Goal status: IN PROGRESS - 10/24/23 - still limited in most motions  4.  Patient will demonstrate improved B UE and scapular strength to >/= 4 to 4+/5 for functional UE use. Baseline: Refer to above UE MMT table Goal status: IN PROGRESS - 10/24/23 - met except lower traps 3-/5  5.  Patient will demonstrate improved B LE strength to >/= 4 to 4+/5 for improved stability and ease of mobility. Baseline: Refer to above LE MMT table 10/24/23- See chart above Goal status: IN PROGRESS - 11/07/2023- MMT chart above   6.  Patient will be able to ambulate with normal gait pattern without increased pain and be able to ascend/descend stairs with 1 HR and reciprocal step pattern safely to access home and community.  Baseline:  Goal status: IN PROGRESS- 11/04/23 see under treatment  7. Patient will report >/= 40/80 on LEFS to demonstrate improved functional ability. Baseline: 19 / 80 = 23.8 % Goal status: INITIAL  8.  Patient will report </= 29% on [employer] to demonstrate improved functional ability. Baseline: 43.2 / 100 = 43.2 % Goal status: INITIAL  9.  Patient will report <25% sleep disturbance due to pain. Baseline: Both R hip and elbow pain interfere with sleep Goal status: IN PROGRESS - 10/24/23 - pain interferes with sleep almost every night 20-30%   PLAN:  PT FREQUENCY: 2x/week  PT DURATION: 12 weeks  PLANNED INTERVENTIONS: 97164- PT Re-evaluation, 97750- Physical Performance Testing, 97110-Therapeutic exercises, 97530- Therapeutic activity, 97112- Neuromuscular re-education, 97535- Self Care, 02859- Manual therapy, [phone]- Gait training, [phone]- Aquatic Therapy, [phone]- Electrical stimulation (unattended), [phone]- Ultrasound, [id number]- Ionotophoresis 4mg /ml Dexamethasone , 79439 (1-2 muscles), [id number] (3+ muscles)- Dry Needling, Patient/Family education, Balance training, Stair training, Taping, Joint mobilization, Spinal  mobilization, DME instructions, Cryotherapy, and Moist heat  PLAN FOR NEXT SESSION: Continue to incorporate LE and core/lumbopelvic strengthening - update HEP as indicated; progress proximal LE stretching and R hip ROM; MT +/- TPDN and/or modalities to address abnormal muscle tension and pain - possible trial of ionto patch to R greater trochanter  Ashaunte Standley, Student-PT 11/07/2023, 12:39 PM

## 2023-11-10 ENCOUNTER — Ambulatory Visit: Admitting: Physical Therapy

## 2023-11-10 ENCOUNTER — Encounter: Payer: Self-pay | Admitting: Physical Therapy

## 2023-11-10 DIAGNOSIS — M25521 Pain in right elbow: Secondary | ICD-10-CM

## 2023-11-10 DIAGNOSIS — M25551 Pain in right hip: Secondary | ICD-10-CM

## 2023-11-10 DIAGNOSIS — R2689 Other abnormalities of gait and mobility: Secondary | ICD-10-CM

## 2023-11-10 DIAGNOSIS — M6281 Muscle weakness (generalized): Secondary | ICD-10-CM

## 2023-11-10 NOTE — Therapy (Signed)
 OUTPATIENT PHYSICAL THERAPY TREATMENT     Patient Name: Sherri Sullivan MRN: [id number] DOB:23-Jan-1961, 63 y.o., female Today's Date: 11/10/2023  END OF SESSION:  PT End of Session - 11/10/23 1452     Visit Number 10    Date for PT Re-Evaluation 12/13/23    Authorization Type [employer]    Authorization Time Period Prior authorization not required    Progress Note Due on Visit 17    PT Start Time 1452   pt was a few mins late   PT Stop Time 1530    PT Time Calculation (min) 38 min    Activity Tolerance Patient tolerated treatment well    Behavior During Therapy WFL for tasks assessed/performed                Past Medical History:  Diagnosis Date   Anginal pain (HCC)    Arthritis    L shoulder- has had injections, degenerative changes in lumbar spine    Back pain    Chicken pox as a child   Constipation    Dizziness    Dysrhythmia 2013   SVT- treated by ablation by Dr. Waddell to f/u with as needed basis    Encounter for preventative adult health care exam with abnormal findings 02/09/2015   HTN (hypertension)    Hypokalemia 02/09/2015   Joint pain    Leg edema    Measles as a child   Measles as a child   Obesity    Panic attack    during episode of feeling to crowded    Preventative health care 02/09/2015   Seasonal allergies    SOB (shortness of breath) 09/23/2011   a little bit; at rest; before ablation, SOB again now (08/2014- due to lack  of exercise)    SVT (supraventricular tachycardia) (HCC)    Uterine fibroid 12/17/2012   Cervical polyp per patient Follows with [facility], Dr Tanda Mulch   Past Surgical History:  Procedure Laterality Date   ABDOMINAL HYSTERECTOMY  1990's   CARDIAC ELECTROPHYSIOLOGY STUDY AND ABLATION  09/23/11   MAXIMUM ACCESS (MAS)POSTERIOR LUMBAR INTERBODY FUSION (PLIF) 1 LEVEL N/A 09/24/2014   Procedure: L5-S1 MAS PLIF ;  Surgeon: Fairy Levels, MD;  Location: [facility];  Service: Neurosurgery;   Laterality: N/A;  L5-S1 MAS PLIF fusion   SUPRAVENTRICULAR TACHYCARDIA ABLATION N/A 09/23/2011   Procedure: SUPRAVENTRICULAR TACHYCARDIA ABLATION;  Surgeon: Danelle LELON Waddell, MD;  Location: [facility] CATH LAB;  Service: Cardiovascular;  Laterality: N/A;   TUBAL LIGATION  1980's   Patient Active Problem List   Diagnosis Date Noted   Severe sleep apnea 11/15/2022   Carpal tunnel syndrome 11/01/2022   Numbness of hand 11/01/2022   Cervical spondylosis 11/01/2022   Cervical radiculopathy 11/01/2022   Needs sleep apnea assessment 08/16/2022   H/O colonoscopy with polypectomy 08/16/2022   History of COVID-19 12/06/2021   Dyspnea 11/27/2020   DDD (degenerative disc disease), cervical 11/27/2020   Atypical chest pain 01/18/2019   Acute left-sided thoracic back pain 01/18/2019   Hyperglycemia 01/09/2018   Colon polyp 08/29/2017   Pain in right knee 06/28/2017   Pain in left knee 06/28/2017   Vitamin D  deficiency 02/25/2017   Polyneuropathy 01/21/2016   Hypokalemia 02/09/2015   Encounter for preventative adult health care exam with abnormal findings 02/09/2015   Preventative health care 02/09/2015   Spondylolisthesis of lumbar region 09/24/2014   Spinal stenosis 05/22/2014   Spondylolisthesis at L5-S1 level 05/22/2014   Lumbar radiculopathy  05/22/2014   Neck pain 04/08/2014   Muscle cramps 01/13/2014   Tendinopathy of rotator cuff 11/28/2013   Essential hypertension, benign 11/28/2013   Urinary frequency 08/17/2013   Uterine fibroid 12/17/2012   Obesity    SVT (supraventricular tachycardia) (HCC) 09/21/2011   LOW BACK PAIN 01/04/2008   Hyperlipidemia 02/28/2007   Allergic rhinitis 02/28/2007   KNEE PAIN, LEFT 02/28/2007    PCP: Domenica Harlene LABOR, MD   REFERRING PROVIDER: Joane Artist RAMAN, MD   REFERRING DIAG:  [id number] (ICD-10-CM) - Right elbow pain  M25.551 (ICD-10-CM) - Right hip pain   THERAPY DIAG:  Pain in right hip  Pain in right elbow  Muscle weakness (generalized)  Other  abnormalities of gait and mobility  RATIONALE FOR EVALUATION AND TREATMENT: Rehabilitation  ONSET DATE: R hip ~1 year; R elbow 3-4 months  NEXT MD VISIT: No upcoming appt scheduled at this time (11/07/23)   SUBJECTIVE:   SUBJECTIVE STATEMENT: Pt reports her pain is around the same as last time. She has not done anything she thinks caused this episode. States that HEP is going okay.   EVAL:  Pt reports her R elbow has been getting better since she saw the MD - had been hurting for ~3 months prior to seeing MD.  She has been wearing elbow brace recommended by MD but not sure if she is positioning it properly as it continues to slide down her arm (does not have brace with her on eval).  Pain localized to R lateral epicondyle.  Still has difficulty lifting things with R hand.  R hip has been hurting closer to 1 year and continues to worsen.  Pain originates at lateral hip and radiates into R groin and anterior thigh, but denies numbness or tingling.  She has difficulty lifting her R foot in/out of a car.  PAIN: Are you having pain? Yes: NPRS scale: 6/10   Pain location: R lateral hip into groin and R anterior thigh  Pain description: stabbing  Aggravating factors: prolonged standing or walking, climbing stairs, putting on socks, rolling over in bed  Relieving factors: nothing so far (tried heat and ice), maybe rubbing motion when applying Aspercreme   Are you having pain? No - lateral epicondyle of R elbow  PERTINENT HISTORY: Arthritis, HTN, s/p R CTR 2024, lumbar spondylolisthesis with radiculopathy, s/p L5-S1 MAS PLIF 2016, cervical spondylosis/DDD, cervical radiculopathy, dizziness, LE edema, RTC tendinopathy, obesity, SVT s/p ablation, h/o B knee pain  PRECAUTIONS: None  HAND DOMINANCE: Right  RED FLAGS: None  WEIGHT BEARING RESTRICTIONS: No  FALLS:  Has patient fallen in last 6 months? No  LIVING ENVIRONMENT: Lives with: lives with their spouse Lives in:  House/apartment Stairs: Yes: Internal: 14 x 2 steps; on right going up and External: 3+4 steps; none Has following equipment at home: None  OCCUPATION: Retired  PLOF: Independent and Leisure: binge watching Netflix, light gardening   PATIENT GOALS: Better mobility with less pain.   OBJECTIVE: (objective measures completed at initial evaluation unless otherwise dated)  DIAGNOSTIC FINDINGS:  08/30/23 - Diagnostic Limited MSK Ultrasound of: Right lateral elbow Intact common extensor tendon origin at lateral epicondyle.  Small avulsion fleck present at superficial portion of lateral epicondyle. Increased vascular activity on Doppler consistent with neovascularity. No visible tears present in the tendon. Impression: Lateral epicondylitis  08/30/23 - DG hip IMPRESSION: Moderate to severe right hip degenerative change.  PATIENT SURVEYS:  Quick Dash: 43.2 / 100 = 43.2 % LEFS: 19 / 80 = 23.8 %,  moderate functional limitation  COGNITION: Overall cognitive status: Within functional limits for tasks assessed     SENSATION: WFL  EDEMA:  N/A  POSTURE:  rounded shoulders, forward head, flexed trunk , and weight shift left  PALPATION: Increased muscle tension with TTP over R glutes and piriformis.  Mildly TTP over R greater trochanter.  TTP over R wrist extensor group and lateral epicondyle.  UPPER EXTREMITY ROM:  Grossly [facility]  UPPER EXTREMITY MMT:  MMT Right eval Left eval R 10/24/23 L 10/24/23  Shoulder flexion 4- 4 4+ 4+  Shoulder extension 4 4 4+ 4+  Shoulder abduction 4- 4+ 4+ 4+  Shoulder adduction      Shoulder internal rotation 4+ 4+ 5 5  Shoulder external rotation 4- 4 4+ 4+  Middle trapezius 3+ 4- 4 4  Lower trapezius 3- 3- 3- 3-  Elbow flexion 4- 4+ 4+ 5  Elbow extension 4- 4- 4+ 4+  Wrist flexion      Wrist extension      Wrist ulnar deviation      Wrist radial deviation      Wrist pronation      Wrist supination      Grip strength (lbs) 13.67# 26.33# 24.33#  29.33#  (Blank rows = not tested)  MUSCLE LENGTH: Hamstrings: mildly tight L>R ITB: mod tight B Piriformis: severe tight R>L Hip flexors: mod tight B Quads: mild tight B Heelcord:   LOWER EXTREMITY ROM:  Active ROM Right eval Left eval R 10/24/23 L 10/24/23 R 11/04/23  Hip flexion 74 91 82 94 90  Hip extension       Hip abduction       Hip adduction       Hip internal rotation 39 44 38 44   Hip external rotation 7 27 20  32 15  Knee flexion       Knee extension       Ankle dorsiflexion       Ankle plantarflexion       Ankle inversion       Ankle eversion        (Blank rows = not tested)  LOWER EXTREMITY MMT:  MMT Right eval Left eval R 10/24/23 L 10/24/23 R 11/07/23 L 11/07/23  Hip flexion 3+ p! 4- 4- p! 4+ 4-; p!  4+  Hip extension 3- 3- 3+ 3+ 3+ 3+  Hip abduction 3- 3+ 3+ 4 4 4   Hip adduction 2+ 3- 3+ limited ROM 4- 3+; limited ROM 4-  Hip internal rotation 4 4+ 4+ 4+ 4+ 4+  Hip external rotation 2 4- 4- limited ROM 4 limited ROM 4+ 4+  Knee flexion 4- 4 4 4 4  4+  Knee extension 4- 4 4+ 4+ 4 4+  Ankle dorsiflexion 4- 4 4+ 4+ 4+ 4+  Ankle plantarflexion        Ankle inversion        Ankle eversion         (Blank rows = not tested)  LOWER EXTREMITY SPECIAL TESTS:  Hip special tests: Belvie (FABER) test: positive , Thomas test: positive , and Ober's test: negative  FUNCTIONAL TESTS:  5 times sit to stand: 27.40 sec  TRANSFERS: Assistive device utilized: None  Sit to stand: Complete Independence and Modified independence -typically relies on UE assist but able to complete sit to stand without UE assist although tendency for weight shift to L with R genu valgum Stand to sit: Complete Independence and Modified independence Chair to chair: Modified  independence Floor: NT   TODAY'S TREATMENT:  11/10/23 THERAPEUTIC EXERCISE: To improve strength, ROM, and flexibility.   Rec Bike- L2x52min  Seated piriformis stretch w/ foot on 9 stool, 2x30 RLE  Standing hip ext 2x10 B  RTB at ankles  Standing hip abd 2x10 B RTB at ankles  Seated Marches w/ RTB under forefoot 2x10- pt expressed more difficulty on R side  THERAPEUTIC ACTIVITIES: To improve functional performance.  Demonstration, verbal and tactile cues throughout for technique. Functional Squats w/ mirror w/ 5# kb on 9' stool 2x10  Fwd Step Ups on 4 2 x10- RLE leading; 2nd set caused pain in R hip  Fwd step up + high knee march 4' x5 B- expressed difficulty  Lateral Step Ups and Overs onto 4 step- 2x10 B    11/07/2023  THERAPEUTIC EXERCISE: To improve strength, ROM, and flexibility.   NuStep L5x6 min  Seated Piriformis Stretch w/ foot on 9 stool- 2x30 RLE  LE MMT (LTG 5)  Side Stepping w/ RTB at distal thighs 2x20' Standing Hip Ext x15 B  Standing Hip Add x15 B THERAPEUTIC ACTIVITIES: To improve functional performance.  Demonstration, verbal and tactile cues throughout for technique. Functional Squats w/ 5# kb on 9 stool- lifting mechanics provided before  Fwd Step ups onto 4 2x1'- pt reports R hip feels wobbly on the step today    11/04/2023  THERAPEUTIC EXERCISE: To improve strength, ROM, and flexibility.  Demonstration, verbal and tactile cues throughout for technique. NuStep L5 x 6 min  Seated piriformis stretch on 9' stool 2x30 Seated gluteal lunge stretch on 9' stool 2x30  THERAPEUTIC ACTIVITIES: To improve functional performance.  Demonstration, verbal and tactile cues throughout for technique. Stairs assessed; ascending pt leans to L side and pulls herself up on RLE; no issues with descending Standing hip abduction x 20 BLE; YTB at ankles x 10 Standing hip extension 10 BLE; YTB at ankles x 10  Side stepping YTB at ankles 3x along counter Standing marching YTB at forefoot x 10 BLE Fwd stepping over pool noodle x 15 BLE- R foot started to drag Lateral stepping over pool noodle x 15 BLE   10/24/2023  THERAPEUTIC EXERCISE: To improve strength, ROM, and flexibility.  Demonstration,  verbal and tactile cues throughout for technique. NuStep L3 x 6 min  Hooklying windshield wipers (B hip IR/ER) x 20 L S/L R hip ER clam x 10  THERAPEUTIC ACTIVITIES: To improve functional performance.  Demonstration, verbal and tactile cues throughout for technique. Hip ROM assessment UE/LE MMT   MANUAL THERAPY: To promote improved flexibility, improved joint mobility, increased ROM, pain modulation, and reduced pain utilizing joint mobilization. R hip grade II hip A/P mobs - increased pain R hip grade II-III distraction MWM for R hip ER   10/20/2023  THERAPEUTIC EXERCISE: To improve strength, ROM, and flexibility.  Demonstration, verbal and tactile cues throughout for technique. Nustep L3x15min  LTR with orange pball x 10 B DKTC orange pball x 10 B Bridges 3x10 with RTB hip ABD  SELF CARE HOME MANAGEMENT: to review HEP and ensure proper/safe performance of exercises Seated hip ABD RTB unilateral 2x10 each side Seated marching RTB X 10 B - fatigue in R hip Seated HS stretch x 30 RLE Seated piriformis stretch leg on 9' stool x 30 RLE Seated hip flexor stretch BLE x 30 Standing ITB stretch x 30 RLE   10/18/2023  THERAPEUTIC EXERCISE: To improve strength, ROM, and flexibility.  Demonstration, verbal and tactile cues throughout for  technique. Rec Bike - L1 x 6 min Seated R figure-4 piriformis stretch with foot positioned on wooden box x 30 (had to elevated treatment table, therefore may need to continue with 9 stool for a while more) Standing hip ABD 3 x 10 bil, UE support on wall ladder for balance Seated hip ADD isometric ball squeeze 10 x 3, 3 sets  NEUROMUSCULAR RE-EDUCATION: To improve balance, coordination, and reduce fall risk.  Hooklying TrA alt unilateral bent-knee fallout 2 x 10 Bridge + B hip ABD clam x 10 Seated alt hip flexion march with arms outstretched 2 x 60, 2nd set with 3# ankle weights  L unilateral heel raises x 8 with UE support on wall, unable on  R  THERAPEUTIC ACTIVITIES: To improve functional performance.  Demonstration, verbal and tactile cues throughout for technique.  Sit <> stand from chair 2 x 10 - intermittently with UE assist from armrests, although some reps with arms crossed on chest - easier to complete sit to stand rise with more difficulty noted on eccentric lowering   10/11/2023  THERAPEUTIC EXERCISE: To improve strength, endurance, ROM, and flexibility.  Demonstration, verbal and tactile cues throughout for technique.  NuStep - L4 x 6 min (UE/LE to promote muscle warm-up and tissue perfusion) Seated R wrist extensor stretch x 30 Seated R wrist flexor stretch x 30 Seated R forearm pronation/supination 2# 10 x 5 Seated R eccentric wrist extension 2# x 10 Seated R eccentric wrist flexion 2# x 10 Yellow Thera-Putty gross grip and twist grip Seated B wrist flexion/extension roll-up with RTB attached to PVC pipe   10/05/2023  THERAPEUTIC EXERCISE: To improve strength, endurance, ROM, and flexibility.  Demonstration, verbal and tactile cues throughout for technique.  Rec Bike - L1 x 6 min Seated lunge position R hip flexor stretch 3 x 30 Seated R hip hinge HS stretch with foot on floor 2 x 30 Seated R figure-4 piriformis stretch with foot positioned on 9 stool 2 x 30 Standing R glute stretch with foot on wooden box 2 x 30 Seated B hip ADD ball squeeze isometric 10 x 5, 2 sets Seated RTB B hip ABD/ER clam 10 x 3-5, 2 sets Seated RTB hip flexion march x 10, cues not to lean back when lifting R LE  MANUAL THERAPY: To promote normalized muscle tension, improved flexibility, improved joint mobility, increased ROM, and reduced pain utilizing connective tissue massage, therapeutic massage, and manual TP therapy. STM/DTM, manual TPR, and IASTM with foam roller to R iliopsoas, TFL and RF  SELF CARE: Provided education to prevent loss of gains achieved with physical therapy.  Provided instruction in self-STM techniques  to R hip flexors using tennis ball, foam roller or rolling pin.    10/03/2023 THERAPEUTIC EXERCISE: To improve strength, endurance, ROM, and flexibility.  Demonstration, verbal and tactile cues throughout for technique.  NuStep - L4 x 6 min (UE/LE to promote muscle warm-up and tissue perfusion) Supine R HS stretch with strap x 30 Hooklying R HS stretch with strap x 30 - preferred  Supine R ITB stretch with strap x 30 - painful Unable to achieve R SKTC or R bent knee glute stretch position in supine  Unable to achieve R figure-4 or KTOS piriformis stretch position in hooklying  Mod thomas R quad/hip flexor stretch with strap 2 x 30 Unable to achieve R side-sitting figure-4/pigeon piriformis stretch position in sitting Seated R figure-4 piriformis stretch with foot positioned on 9 stool 2 x 30 Standing R glute stretch  with foot on wooden box 2 x 30 Seated B hip ADD ball squeeze isometric 10 x 5, 2 sets Seated RTB B hip ABD/ER clam 10 x 3-5, 2 sets Seated RTB hip flexion march x 10, cues not to lean back when lifting R LE   09/20/2023 - Eval SELF CARE:  Reviewed eval findings and role of PT in addressing identified deficits as well as instruction in ice massage for R lateral epicondylitis (see below).    PATIENT EDUCATION:  Education details: progress with PT, ongoing PT POC, and HEP update  Person educated: Patient Education method: Explanation, Demonstration, Verbal cues, Tactile cues, and MedBridgeGO app updated Education comprehension: verbalized understanding, returned demonstration, verbal cues required, tactile cues required, and needs further education  HOME EXERCISE PROGRAM: *Pt using MedBridgeGO app  Access Code: [email or web address] URL: [email or web address] Date: 11/04/2023 Prepared by: Braylin Clark  Exercises - Seated Hip Flexor Stretch  - 2-3 x daily - 7 x weekly - 3 reps - 30 sec hold - Seated Hamstring Stretch  - 2-3 x daily - 7 x weekly - 3 reps - 30  sec hold - Seated Piriformis Stretch  - 2-3 x daily - 7 x weekly - 3 reps - 30 sec hold - Standing Gluteal Stretch on Chair  - 2-3 x daily - 7 x weekly - 3 reps - 30 sec hold - Standing ITB Stretch (Mirrored)  - 2-3 x daily - 7 x weekly - 3 reps - 30 sec hold - Seated Hip Adduction Squeeze with Ball  - 1 x daily - 7 x weekly - 2 sets - 10 reps - 3-5 sec hold - Seated Hip Abduction with Resistance  - 1 x daily - 7 x weekly - 2 sets - 10 reps - 3 sec hold - Seated March with Resistance  - 1 x daily - 7 x weekly - 2 sets - 10 reps - 3 sec hold - Seated Wrist Extension Stretch  - 2 x daily - 7 x weekly - 3 reps - 30 sec hold - Seated Wrist Flexion Stretch  - 2 x daily - 7 x weekly - 3 reps - 30 sec hold - Putty Squeezes  - 1 x daily - 7 x weekly - 2 sets - 10 reps - 3 sec hold - Seated Eccentric Wrist Flexion with Dumbbell  - 1 x daily - 3 x weekly - 2 sets - 10 reps - 3 sec hold - Seated Eccentric Wrist Extension  - 1 x daily - 3 x weekly - 2 sets - 10 reps - 3 sec hold - Seated Wrist Supination Pronation with Can  - 1 x daily - 3 x weekly - 2 sets - 10 reps - 3 sec hold - Wrist Flexion and Extension with Resistance Bar  - 1 x daily - 3 x weekly - 2 sets - 10 reps - 3 sec hold - Supine Hip Internal and External Rotation  - 1 x daily - 7 x weekly - 2 sets - 10 reps - 3 sec hold - Clamshell  - 1 x daily - 3 x weekly - 2 sets - 10 reps - 3 sec hold - Standing Hip Abduction with Resistance at Ankles and Counter Support  - 1 x daily - 3-4 x weekly - 2 sets - 10 reps - Standing Hip Extension with Resistance at Ankles and Counter Support  - 1 x daily - 3-4 x weekly - 2 sets - 10 reps - Side Stepping with  Resistance at Ankles and Counter Support  - 1 x daily - 3-4 x weekly - 2 sets - 10 reps - Marching with Resistance  - 1 x daily - 3-4 x weekly - 2 sets - 10 reps  Patient Education - Ice Massage   ASSESSMENT:  CLINICAL IMPRESSION: Lakesa reports that her pain is still present. She is doing her  exercises at home when she can. Pt recently had a loss of a family member and is coping with that, preparing for a funeral this weekend. Pt was able to participate in all activities in today's treatment session. She reported R hip pain with step ups (both fwd and lateral) and required additional break time to sit. She had more difficulty with the forward step ups with contralateral high knee march and could only perform half the amount on those d/t inc'd pain and weakness. She expressed today was challenging but, she enjoys coming in for therapy. Florina will continue to benefit from skilled PT intervention to address deficits in order to improve her mobility and activity tolerance w/ dec'd pain interference.     EVAL:  SHAIMA SARDINAS is a 63 y.o. female who was referred to physical therapy for evaluation and treatment for R hip and R elbow pain.  Patient reports onset of R hip pain beginning ~1+ year ago.  Pain is worse with prolonged standing or walking, climbing stairs, putting on socks, rolling over in bed and limits sleeping.  R elbow pain with more recent onset ~3-4 months ago and attributed to secondary to lateral epicondylitis.  Pain is worse with repetitive motion - folding laundry, working/playing on computer, lifting heavy items.  Patient has deficits in R hip ROM, proximal LE flexibility, R>L B LE, UE and grip strength, abnormal posture, and TTP with abnormal muscle tension which are interfering with ADLs and are impacting quality of life.  On LEFS patient scored 19/80 demonstrating moderate functional limitation.  On QuickDASH patient scored 43.2/100 demonstrating 43.2% disability.  Jeffrie will benefit from skilled PT to address above deficits to improve mobility and activity tolerance with decreased pain interference.  OBJECTIVE IMPAIRMENTS: Abnormal gait, decreased activity tolerance, decreased balance, decreased endurance, decreased knowledge of condition, decreased mobility, difficulty walking,  decreased ROM, decreased strength, increased fascial restrictions, impaired perceived functional ability, increased muscle spasms, impaired flexibility, impaired UE functional use, improper body mechanics, postural dysfunction, and pain.   ACTIVITY LIMITATIONS: carrying, lifting, sitting, standing, squatting, sleeping, stairs, transfers, bed mobility, dressing, and locomotion level  PARTICIPATION LIMITATIONS: meal prep, cleaning, laundry, driving, shopping, and community activity  PERSONAL FACTORS: Fitness, Past/current experiences, Time since onset of injury/illness/exacerbation, and 3+ comorbidities: Arthritis, HTN, s/p R CTR 2024, lumbar spondylolisthesis with radiculopathy, s/p L5-S1 MAS PLIF 2016, cervical spondylosis/DDD, cervical radiculopathy, dizziness, LE edema, RTC tendinopathy, obesity, SVT s/p ablation, h/o B knee pain are also affecting patient's functional outcome.   REHAB POTENTIAL: Good  CLINICAL DECISION MAKING: Unstable/unpredictable  EVALUATION COMPLEXITY: High   GOALS: Goals reviewed with patient? Yes  SHORT TERM GOALS: Target date: 11/01/2023  Patient will be independent with initial HEP. Baseline:  10/03/23 - initial LE HEP provided today 10/05/23 - HEP reviewed and modified to improve patient tolerance 10/20/23 - MET for LE HEP Goal status: MET - 10/24/23  2.  Patient will report at least 25% improvement in R hip and elbow pain to improve QOL. Baseline: R hip - 9/10 on eval, up to 10/10 at worst; R elbow - 3/10 on eval, up to 9/10 at  worst 10/03/23 - no R elbow pain today Goal status: MET - 10/24/23 - 90% improvement in he R elbow; 40-50% improvement in her R hip  3.  Patient will improve R hip flexion AROM to >/= 90 and R hip ER AROM to >/= 15 to increase ease of car transfers. Baseline: Refer to above LE ROM table Goal status: MET - 10/24/23 - R hip flexion 90, ER 15  LONG TERM GOALS: Target date: 12/13/2023   Patient will be independent with advanced/ongoing  HEP to improve outcomes and carryover.  Baseline:  10/24/23 - Met for current HEP, anticipate further updates  Goal status: IN PROGRESS - 11/07/23- Met for current HEP   2.  Patient will report at least 50-75% improvement in R hip and elbow pain to improve QOL. Baseline: R hip - 9/10 on eval, up to 10/10 at worst; R elbow - 3/10 on eval, up to 9/10 at worst Goal status: IN PROGRESS - 10/24/23 - 90% improvement in he R elbow; 40-50% improvement in her R hip  3.  Patient will demonstrate functional pain free R hip ROM to perform ADLs.   Baseline: Refer to above LE ROM table Goal status: IN PROGRESS - 10/24/23 - still limited in most motions  4.  Patient will demonstrate improved B UE and scapular strength to >/= 4 to 4+/5 for functional UE use. Baseline: Refer to above UE MMT table Goal status: IN PROGRESS - 10/24/23 - met except lower traps 3-/5  5.  Patient will demonstrate improved B LE strength to >/= 4 to 4+/5 for improved stability and ease of mobility. Baseline: Refer to above LE MMT table 10/24/23- See chart above Goal status: IN PROGRESS - 11/07/2023- MMT chart above   6.  Patient will be able to ambulate with normal gait pattern without increased pain and be able to ascend/descend stairs with 1 HR and reciprocal step pattern safely to access home and community.  Baseline:  Goal status: IN PROGRESS- 11/04/23 see under treatment  7. Patient will report >/= 40/80 on LEFS to demonstrate improved functional ability. Baseline: 19 / 80 = 23.8 % Goal status: INITIAL  8.  Patient will report </= 29% on QuickDASH to demonstrate improved functional ability. Baseline: 43.2 / 100 = 43.2 % Goal status: INITIAL  9.  Patient will report <25% sleep disturbance due to pain. Baseline: Both R hip and elbow pain interfere with sleep Goal status: IN PROGRESS - 10/24/23 - pain interferes with sleep almost every night 20-30%   PLAN:  PT FREQUENCY: 2x/week  PT DURATION: 12 weeks  PLANNED  INTERVENTIONS: 97164- PT Re-evaluation, 97750- Physical Performance Testing, 97110-Therapeutic exercises, 97530- Therapeutic activity, 97112- Neuromuscular re-education, 97535- Self Care, 02859- Manual therapy, [phone]- Gait training, [phone]- Aquatic Therapy, [phone]- Electrical stimulation (unattended), [phone]- Ultrasound, [id number]- Ionotophoresis 4mg /ml Dexamethasone , 79439 (1-2 muscles), 20561 (3+ muscles)- Dry Needling, Patient/Family education, Balance training, Stair training, Taping, Joint mobilization, Spinal mobilization, DME instructions, Cryotherapy, and Moist heat  PLAN FOR NEXT SESSION: Continue to incorporate LE and core/lumbopelvic strengthening - update HEP as indicated; progress proximal LE stretching and R hip ROM; MT +/- TPDN and/or modalities/ STM to address abnormal muscle tension and pain - possible trial of ionto patch to R greater trochanter  Issis Lindseth, Student-PT 11/10/2023, 5:54 PM

## 2023-11-12 ENCOUNTER — Other Ambulatory Visit: Payer: Self-pay | Admitting: Family Medicine

## 2023-11-14 ENCOUNTER — Other Ambulatory Visit: Payer: Self-pay | Admitting: Cardiology

## 2023-11-14 DIAGNOSIS — J301 Allergic rhinitis due to pollen: Secondary | ICD-10-CM | POA: Diagnosis not present

## 2023-11-14 DIAGNOSIS — G4733 Obstructive sleep apnea (adult) (pediatric): Secondary | ICD-10-CM | POA: Diagnosis not present

## 2023-11-14 DIAGNOSIS — J4530 Mild persistent asthma, uncomplicated: Secondary | ICD-10-CM | POA: Diagnosis not present

## 2023-11-14 DIAGNOSIS — Z6841 Body Mass Index (BMI) 40.0 and over, adult: Secondary | ICD-10-CM | POA: Diagnosis not present

## 2023-11-14 NOTE — Progress Notes (Signed)
 [facility] Pulmonary, Critical Care and Sleep Medicine   Name: Sherri Sullivan MRN: [id number] DOB: 1960-04-16 Referring provider: Harlene Horton, MD  Chief Complaint  Patient presents with  . Follow-up    Breathing has been much better    Summary  63 y.o. female former smoker with obstructive sleep apnea, asthma, and allergic rhinitis.  Subjective   Breathing is much better.  Sinuses improved also.  Has been using symbicort twice per day and flonase  at night.  Symbicort is expensive, but it has helped a lot.  Uses CPAP nightly.  Not issues with mask fit or pressure.  Her air conditioner broke last week and this has made it difficult to sleep.  This should be getting fixed later this week.  Past Medical History  Arthritis, HTN, Panic attacks, Allergies, SVT   Past Surgical History  She  has a past surgical history that includes Cardiac electrophysiology study and ablation (09/23/2011); Abdominal hysterectomy (1990); Tubal ligation (1980); Posterior fusion lumbar spine (09/24/2014); and Carpal tunnel release.  Social History  She  reports that she has quit smoking. She has never used smokeless tobacco. She reports current alcohol use.  Family History  Her family history includes Cataracts in her mother; Diabetes in her mother; Heart disease in her father and mother; Hypertension in her father and mother; Stroke in her mother.    Vital Signs  BP 126/84   Pulse 72   Temp 98.1 F (36.7 C) (Oral)   Resp 16   Ht 1.575 m (5' 2)   Wt 99.9 kg (220 lb 3.2 oz)   SpO2 98%   BMI 40.28 kg/m   Physical Exam   General - pleasant ENT - no sinus tenderness, no oral exudate Cardiac - regular rate and rhythm, no murmur Chest - breath sounds equal, no wheeze or rales Ext - no edema, cyanosis, or clubbing Skin - no rashes Psych - normal mood and behavior  Pulmonary Tests  FeNO 10/12/23 >> 61 ppb Spirometry 10/12/23 >> FEV1 1.60 (86%), FEV1 88%  Sleep Tests  HST  10/01/22 >> AHI 31.5, SpO2 low 79%  Auto CPAP 10/12/23 to 11/10/23 >> used on 26 of 30 nights with average 6 hrs 1 min.  Average AHI 0.2 with median CPAP 6 and 95 th percentile CPAP 8 cm H2O    Chest Imaging    Cardiac Tests  Echo 09/14/22 >> EF 60 to 65%, mild LVH, grade 1 DD      Assessment/Plan   Severe obstructive sleep apnea. - she is compliant with CPAP and reports benefit from therapy - she uses [employer] for her DME - current CPAP ordered August 2024 - continue auto CPAP 5 to 15 cm H2O  Mild, persistent asthma. - advised her to try decreasing use of symbicort until she uses it prn - if she needs to be on maintenance inhaler therapy, then she will need a more affordable option than symbicort  Allergic and CPAP rhinitis. - can change to prn flonase  and zyrtec  Morbid obesity with BMI 40.28. - she was previously enrolled with weight management program with [facility], but much of the interventions were not covered by insurance and was too expensive - she has modified her diet and has been exercising on a regular basis - zepbound  was not covered by her insurance plan  Patient Instructions   Patient Instructions  Try decreasing how often you use symbicort until you are just using as needed.  If you get to  the point that you feel the need to be on a regular inhaler regimen again, then send us  a message and we can look into options that have better insurance coverage.  You can use flonase  as needed.  Follow up in 6 months.  Allergies and Medications  Allergies[1]    Medication List       * Accurate as of November 14, 2023  1:30 PM. If you have any questions, ask your nurse or doctor.          CONTINUE taking these medications    Advanced Probiotic-14 3 billion cell Cap Generic drug: Lactobac 66-Bifido 4-S.thermo Take by mouth.   ALPRAZolam  0.25 mg tablet Commonly known as: XANAX  Take 0.25 mg by mouth 3 (three) times a day as needed for anxiety.   budesonide-formoteroL  160-4.5 mcg/actuation inhaler Commonly known as: Symbicort Inhale 2 puffs in the morning and 2 puffs before bedtime.   cetirizine 10 mg tablet Commonly known as: ZyrTEC Take by mouth.   Daily Multivitamin-Minerals Tab Generic drug: multivitamin with minerals Take 1 tablet by mouth.   diclofenac  75 mg EC tablet Commonly known as: VOLTAREN    estradioL  2 mg tablet Commonly known as: ESTRACE    fluticasone  propionate 50 mcg/spray nasal spray Commonly known as: FLONASE  2 sprays.   methocarbamoL  500 mg tablet Commonly known as: ROBAXIN  Take by mouth.   Omega-3 2100 1,050-1,200 mg Cap Generic drug: omega-3 (fish oil) Take 353 mg by mouth.   potassium chloride  20 mEq ER tablet   triamterene -hydroCHLOROthiazide 37.5-25 mg per tablet Commonly known as: MAXZIDE-25 Take 1 tablet by mouth.       STOP taking these medications    Zepbound  2.5 mg/0.5 mL subcutaneous pen injector Generic drug: tirzepatide  (weight loss) Stopped by: Carolynne Allan, MD        Time Spent  On the day of the visit I spent 26 minutes preparing to see the patient, obtaining and/or reviewing separately obtained history, performing a medically appropriate examination and evaluation, counseling and educating the patient/caregiver, and documenting clinical information in the electronic medical record.This time does not include any time spent performing procedures or assesments that are separately billable.    Signature  Carolynne Allan, MD 11/14/2023, 1:30 PM  [address]   [address]   [address] [place], [place] [address] [place], [place] [address]  [place], [place] [address] [phone]  [phone]  [phone]       [1] Allergies Allergen Reactions  . Doxycycline  Diarrhea and Nausea And Vomiting  *Some images could not be shown.

## 2023-11-15 ENCOUNTER — Ambulatory Visit: Admitting: Physical Therapy

## 2023-11-15 DIAGNOSIS — M25551 Pain in right hip: Secondary | ICD-10-CM | POA: Diagnosis not present

## 2023-11-15 DIAGNOSIS — R2689 Other abnormalities of gait and mobility: Secondary | ICD-10-CM

## 2023-11-15 DIAGNOSIS — M6281 Muscle weakness (generalized): Secondary | ICD-10-CM

## 2023-11-15 DIAGNOSIS — M25521 Pain in right elbow: Secondary | ICD-10-CM

## 2023-11-15 NOTE — Therapy (Signed)
 OUTPATIENT PHYSICAL THERAPY TREATMENT     Patient Name: Sherri Sullivan MRN: [id number] DOB:19-Oct-1960, 63 y.o., female Today's Date: 11/15/2023  END OF SESSION:  PT End of Session - 11/15/23 1404     Visit Number 11    Date for PT Re-Evaluation 12/13/23    Authorization Type [employer]    Authorization Time Period Prior authorization not required    Progress Note Due on Visit 17    PT Start Time 1405    PT Stop Time 1443    PT Time Calculation (min) 38 min    Activity Tolerance Patient tolerated treatment well    Behavior During Therapy WFL for tasks assessed/performed                Past Medical History:  Diagnosis Date   Anginal pain (HCC)    Arthritis    L shoulder- has had injections, degenerative changes in lumbar spine    Back pain    Chicken pox as a child   Constipation    Dizziness    Dysrhythmia 2013   SVT- treated by ablation by Dr. Waddell to f/u with as needed basis    Encounter for preventative adult health care exam with abnormal findings 02/09/2015   HTN (hypertension)    Hypokalemia 02/09/2015   Joint pain    Leg edema    Measles as a child   Measles as a child   Obesity    Panic attack    during episode of feeling to crowded    Preventative health care 02/09/2015   Seasonal allergies    SOB (shortness of breath) 09/23/2011   a little bit; at rest; before ablation, SOB again now (08/2014- due to lack  of exercise)    SVT (supraventricular tachycardia) (HCC)    Uterine fibroid 12/17/2012   Cervical polyp per patient Follows with [facility], Dr Tanda Mulch   Past Surgical History:  Procedure Laterality Date   ABDOMINAL HYSTERECTOMY  1990's   CARDIAC ELECTROPHYSIOLOGY STUDY AND ABLATION  09/23/11   MAXIMUM ACCESS (MAS)POSTERIOR LUMBAR INTERBODY FUSION (PLIF) 1 LEVEL N/A 09/24/2014   Procedure: L5-S1 MAS PLIF ;  Surgeon: Fairy Levels, MD;  Location: [facility];  Service: Neurosurgery;  Laterality: N/A;  L5-S1 MAS PLIF  fusion   SUPRAVENTRICULAR TACHYCARDIA ABLATION N/A 09/23/2011   Procedure: SUPRAVENTRICULAR TACHYCARDIA ABLATION;  Surgeon: Danelle LELON Waddell, MD;  Location: [facility] CATH LAB;  Service: Cardiovascular;  Laterality: N/A;   TUBAL LIGATION  1980's   Patient Active Problem List   Diagnosis Date Noted   Severe sleep apnea 11/15/2022   Carpal tunnel syndrome 11/01/2022   Numbness of hand 11/01/2022   Cervical spondylosis 11/01/2022   Cervical radiculopathy 11/01/2022   Needs sleep apnea assessment 08/16/2022   H/O colonoscopy with polypectomy 08/16/2022   History of COVID-19 12/06/2021   Dyspnea 11/27/2020   DDD (degenerative disc disease), cervical 11/27/2020   Atypical chest pain 01/18/2019   Acute left-sided thoracic back pain 01/18/2019   Hyperglycemia 01/09/2018   Colon polyp 08/29/2017   Pain in right knee 06/28/2017   Pain in left knee 06/28/2017   Vitamin D  deficiency 02/25/2017   Polyneuropathy 01/21/2016   Hypokalemia 02/09/2015   Encounter for preventative adult health care exam with abnormal findings 02/09/2015   Preventative health care 02/09/2015   Spondylolisthesis of lumbar region 09/24/2014   Spinal stenosis 05/22/2014   Spondylolisthesis at L5-S1 level 05/22/2014   Lumbar radiculopathy 05/22/2014   Neck pain 04/08/2014  Muscle cramps 01/13/2014   Tendinopathy of rotator cuff 11/28/2013   Essential hypertension, benign 11/28/2013   Urinary frequency 08/17/2013   Uterine fibroid 12/17/2012   Obesity    SVT (supraventricular tachycardia) (HCC) 09/21/2011   LOW BACK PAIN 01/04/2008   Hyperlipidemia 02/28/2007   Allergic rhinitis 02/28/2007   KNEE PAIN, LEFT 02/28/2007    PCP: Domenica Harlene LABOR, MD   REFERRING PROVIDER: Joane Artist RAMAN, MD   REFERRING DIAG:  [phone] (ICD-10-CM) - Right elbow pain  M25.551 (ICD-10-CM) - Right hip pain   THERAPY DIAG:  Pain in right hip  Pain in right elbow  Muscle weakness (generalized)  Other abnormalities of gait and  mobility  RATIONALE FOR EVALUATION AND TREATMENT: Rehabilitation  ONSET DATE: R hip ~1 year; R elbow 3-4 months  NEXT MD VISIT: No upcoming appt scheduled at this time (11/07/23)   SUBJECTIVE:   SUBJECTIVE STATEMENT: Pt reports she had a bit of an emotional weekend. She says pain wise she is good today, not in as much pain. Noticed that her elbow now has some numbness to it started on Sunday.   EVAL:  Pt reports her R elbow has been getting better since she saw the MD - had been hurting for ~3 months prior to seeing MD.  She has been wearing elbow brace recommended by MD but not sure if she is positioning it properly as it continues to slide down her arm (does not have brace with her on eval).  Pain localized to R lateral epicondyle.  Still has difficulty lifting things with R hand.  R hip has been hurting closer to 1 year and continues to worsen.  Pain originates at lateral hip and radiates into R groin and anterior thigh, but denies numbness or tingling.  She has difficulty lifting her R foot in/out of a car.  PAIN: Are you having pain? Yes: NPRS scale: 5/10   Pain location: R lateral hip into groin and R anterior thigh  Pain description: stabbing  Aggravating factors: prolonged standing or walking, climbing stairs, putting on socks, rolling over in bed  Relieving factors: nothing so far (tried heat and ice), maybe rubbing motion when applying Aspercreme   Are you having pain? No - lateral epicondyle of R elbow  PERTINENT HISTORY: Arthritis, HTN, s/p R CTR 2024, lumbar spondylolisthesis with radiculopathy, s/p L5-S1 MAS PLIF 2016, cervical spondylosis/DDD, cervical radiculopathy, dizziness, LE edema, RTC tendinopathy, obesity, SVT s/p ablation, h/o B knee pain  PRECAUTIONS: None  HAND DOMINANCE: Right  RED FLAGS: None  WEIGHT BEARING RESTRICTIONS: No  FALLS:  Has patient fallen in last 6 months? No  LIVING ENVIRONMENT: Lives with: lives with their spouse Lives in:  House/apartment Stairs: Yes: Internal: 14 x 2 steps; on right going up and External: 3+4 steps; none Has following equipment at home: None  OCCUPATION: Retired  PLOF: Independent and Leisure: binge watching Netflix, light gardening   PATIENT GOALS: Better mobility with less pain.   OBJECTIVE: (objective measures completed at initial evaluation unless otherwise dated)  DIAGNOSTIC FINDINGS:  08/30/23 - Diagnostic Limited MSK Ultrasound of: Right lateral elbow Intact common extensor tendon origin at lateral epicondyle.  Small avulsion fleck present at superficial portion of lateral epicondyle. Increased vascular activity on Doppler consistent with neovascularity. No visible tears present in the tendon. Impression: Lateral epicondylitis  08/30/23 - DG hip IMPRESSION: Moderate to severe right hip degenerative change.  PATIENT SURVEYS:  Quick Dash: 43.2 / 100 = 43.2 % LEFS: 19 / 80 =  23.8 %, moderate functional limitation  COGNITION: Overall cognitive status: Within functional limits for tasks assessed     SENSATION: WFL  EDEMA:  N/A  POSTURE:  rounded shoulders, forward head, flexed trunk , and weight shift left  PALPATION: Increased muscle tension with TTP over R glutes and piriformis.  Mildly TTP over R greater trochanter.  TTP over R wrist extensor group and lateral epicondyle.  UPPER EXTREMITY ROM:  Grossly [facility]  UPPER EXTREMITY MMT:  MMT Right eval Left eval R 10/24/23 L 10/24/23  Shoulder flexion 4- 4 4+ 4+  Shoulder extension 4 4 4+ 4+  Shoulder abduction 4- 4+ 4+ 4+  Shoulder adduction      Shoulder internal rotation 4+ 4+ 5 5  Shoulder external rotation 4- 4 4+ 4+  Middle trapezius 3+ 4- 4 4  Lower trapezius 3- 3- 3- 3-  Elbow flexion 4- 4+ 4+ 5  Elbow extension 4- 4- 4+ 4+  Wrist flexion      Wrist extension      Wrist ulnar deviation      Wrist radial deviation      Wrist pronation      Wrist supination      Grip strength (lbs) 13.67# 26.33# 24.33#  29.33#  (Blank rows = not tested)  MUSCLE LENGTH: Hamstrings: mildly tight L>R ITB: mod tight B Piriformis: severe tight R>L Hip flexors: mod tight B Quads: mild tight B Heelcord:   LOWER EXTREMITY ROM:  Active ROM Right eval Left eval R 10/24/23 L 10/24/23 R 11/04/23  Hip flexion 74 91 82 94 90  Hip extension       Hip abduction       Hip adduction       Hip internal rotation 39 44 38 44   Hip external rotation 7 27 20  32 15  Knee flexion       Knee extension       Ankle dorsiflexion       Ankle plantarflexion       Ankle inversion       Ankle eversion        (Blank rows = not tested)  LOWER EXTREMITY MMT:  MMT Right eval Left eval R 10/24/23 L 10/24/23 R 11/07/23 L 11/07/23  Hip flexion 3+ p! 4- 4- p! 4+ 4-; p!  4+  Hip extension 3- 3- 3+ 3+ 3+ 3+  Hip abduction 3- 3+ 3+ 4 4 4   Hip adduction 2+ 3- 3+ limited ROM 4- 3+; limited ROM 4-  Hip internal rotation 4 4+ 4+ 4+ 4+ 4+  Hip external rotation 2 4- 4- limited ROM 4 limited ROM 4+ 4+  Knee flexion 4- 4 4 4 4  4+  Knee extension 4- 4 4+ 4+ 4 4+  Ankle dorsiflexion 4- 4 4+ 4+ 4+ 4+  Ankle plantarflexion        Ankle inversion        Ankle eversion         (Blank rows = not tested)  LOWER EXTREMITY SPECIAL TESTS:  Hip special tests: Belvie (FABER) test: positive , Thomas test: positive , and Ober's test: negative  FUNCTIONAL TESTS:  5 times sit to stand: 27.40 sec  TRANSFERS: Assistive device utilized: None  Sit to stand: Complete Independence and Modified independence -typically relies on UE assist but able to complete sit to stand without UE assist although tendency for weight shift to L with R genu valgum Stand to sit: Complete Independence and Modified independence Chair to  chair: Modified independence Floor: NT   TODAY'S TREATMENT: 11/15/23 THERAPEUTIC EXERCISE: To improve strength, ROM, and flexibility.   NuStep L5x36min  Standing hip ext x15 B w/ RTB at ankles  Standing hip abd 2x10 RTB  Standing Marches  1# B, 2x30 Alt hip flexion marches w/ 2-3s holds 2x10  THERAPEUTIC ACTIVITIES: To improve functional performance.  Demonstration, verbal and tactile cues throughout for technique. Functional Squats w/ mirror w/ 10# kb on 9' stool 2x10  Stationary lunge x10  Stationary lunge w/ contra 5# kb x10 B- audible popping from L knee  Fwd Step Ups + contra load 5# kb  Lateral Step ups contra load 5# kb 2x10 B (alt lead foot)  11/10/23 THERAPEUTIC EXERCISE: To improve strength, ROM, and flexibility.   Rec Bike- L2x29min  Seated piriformis stretch w/ foot on 9 stool, 2x30 RLE  Standing hip ext 2x10 B RTB at ankles  Standing hip abd 2x10 B RTB at ankles  Seated Marches w/ RTB under forefoot 2x10- pt expressed more difficulty on R side  THERAPEUTIC ACTIVITIES: To improve functional performance.  Demonstration, verbal and tactile cues throughout for technique. Functional Squats w/ mirror w/ 5# kb on 9' stool 2x10  Fwd Step Ups on 4 2 x10- RLE leading; 2nd set caused pain in R hip  Fwd step up + high knee march 4' x5 B- expressed difficulty  Lateral Step Ups and Overs onto 4 step- 2x10 B    11/07/2023  THERAPEUTIC EXERCISE: To improve strength, ROM, and flexibility.   NuStep L5x6 min  Seated Piriformis Stretch w/ foot on 9 stool- 2x30 RLE  LE MMT (LTG 5)  Side Stepping w/ RTB at distal thighs 2x20' Standing Hip Ext x15 B  Standing Hip Add x15 B THERAPEUTIC ACTIVITIES: To improve functional performance.  Demonstration, verbal and tactile cues throughout for technique. Functional Squats w/ 5# kb on 9 stool- lifting mechanics provided before  Fwd Step ups onto 4 2x1'- pt reports R hip feels wobbly on the step today    11/04/2023  THERAPEUTIC EXERCISE: To improve strength, ROM, and flexibility.  Demonstration, verbal and tactile cues throughout for technique. NuStep L5 x 6 min  Seated piriformis stretch on 9' stool 2x30 Seated gluteal lunge stretch on 9' stool 2x30  THERAPEUTIC  ACTIVITIES: To improve functional performance.  Demonstration, verbal and tactile cues throughout for technique. Stairs assessed; ascending pt leans to L side and pulls herself up on RLE; no issues with descending Standing hip abduction x 20 BLE; YTB at ankles x 10 Standing hip extension 10 BLE; YTB at ankles x 10  Side stepping YTB at ankles 3x along counter Standing marching YTB at forefoot x 10 BLE Fwd stepping over pool noodle x 15 BLE- R foot started to drag Lateral stepping over pool noodle x 15 BLE   10/24/2023  THERAPEUTIC EXERCISE: To improve strength, ROM, and flexibility.  Demonstration, verbal and tactile cues throughout for technique. NuStep L3 x 6 min  Hooklying windshield wipers (B hip IR/ER) x 20 L S/L R hip ER clam x 10  THERAPEUTIC ACTIVITIES: To improve functional performance.  Demonstration, verbal and tactile cues throughout for technique. Hip ROM assessment UE/LE MMT   MANUAL THERAPY: To promote improved flexibility, improved joint mobility, increased ROM, pain modulation, and reduced pain utilizing joint mobilization. R hip grade II hip A/P mobs - increased pain R hip grade II-III distraction MWM for R hip ER   10/20/2023  THERAPEUTIC EXERCISE: To improve strength, ROM, and flexibility.  Demonstration, verbal and tactile cues throughout for technique. Nustep L3x36min  LTR with orange pball x 10 B DKTC orange pball x 10 B Bridges 3x10 with RTB hip ABD  SELF CARE HOME MANAGEMENT: to review HEP and ensure proper/safe performance of exercises Seated hip ABD RTB unilateral 2x10 each side Seated marching RTB X 10 B - fatigue in R hip Seated HS stretch x 30 RLE Seated piriformis stretch leg on 9' stool x 30 RLE Seated hip flexor stretch BLE x 30 Standing ITB stretch x 30 RLE   10/18/2023  THERAPEUTIC EXERCISE: To improve strength, ROM, and flexibility.  Demonstration, verbal and tactile cues throughout for technique. Rec Bike - L1 x 6 min Seated R figure-4  piriformis stretch with foot positioned on wooden box x 30 (had to elevated treatment table, therefore may need to continue with 9 stool for a while more) Standing hip ABD 3 x 10 bil, UE support on wall ladder for balance Seated hip ADD isometric ball squeeze 10 x 3, 3 sets  NEUROMUSCULAR RE-EDUCATION: To improve balance, coordination, and reduce fall risk.  Hooklying TrA alt unilateral bent-knee fallout 2 x 10 Bridge + B hip ABD clam x 10 Seated alt hip flexion march with arms outstretched 2 x 60, 2nd set with 3# ankle weights  L unilateral heel raises x 8 with UE support on wall, unable on R  THERAPEUTIC ACTIVITIES: To improve functional performance.  Demonstration, verbal and tactile cues throughout for technique.  Sit <> stand from chair 2 x 10 - intermittently with UE assist from armrests, although some reps with arms crossed on chest - easier to complete sit to stand rise with more difficulty noted on eccentric lowering   10/11/2023  THERAPEUTIC EXERCISE: To improve strength, endurance, ROM, and flexibility.  Demonstration, verbal and tactile cues throughout for technique.  NuStep - L4 x 6 min (UE/LE to promote muscle warm-up and tissue perfusion) Seated R wrist extensor stretch x 30 Seated R wrist flexor stretch x 30 Seated R forearm pronation/supination 2# 10 x 5 Seated R eccentric wrist extension 2# x 10 Seated R eccentric wrist flexion 2# x 10 Yellow Thera-Putty gross grip and twist grip Seated B wrist flexion/extension roll-up with RTB attached to PVC pipe   10/05/2023  THERAPEUTIC EXERCISE: To improve strength, endurance, ROM, and flexibility.  Demonstration, verbal and tactile cues throughout for technique.  Rec Bike - L1 x 6 min Seated lunge position R hip flexor stretch 3 x 30 Seated R hip hinge HS stretch with foot on floor 2 x 30 Seated R figure-4 piriformis stretch with foot positioned on 9 stool 2 x 30 Standing R glute stretch with foot on wooden box 2 x  30 Seated B hip ADD ball squeeze isometric 10 x 5, 2 sets Seated RTB B hip ABD/ER clam 10 x 3-5, 2 sets Seated RTB hip flexion march x 10, cues not to lean back when lifting R LE  MANUAL THERAPY: To promote normalized muscle tension, improved flexibility, improved joint mobility, increased ROM, and reduced pain utilizing connective tissue massage, therapeutic massage, and manual TP therapy. STM/DTM, manual TPR, and IASTM with foam roller to R iliopsoas, TFL and RF  SELF CARE: Provided education to prevent loss of gains achieved with physical therapy.  Provided instruction in self-STM techniques to R hip flexors using tennis ball, foam roller or rolling pin.    10/03/2023 THERAPEUTIC EXERCISE: To improve strength, endurance, ROM, and flexibility.  Demonstration, verbal and tactile cues throughout for technique.  NuStep - L4 x 6 min (UE/LE to promote muscle warm-up and tissue perfusion) Supine R HS stretch with strap x 30 Hooklying R HS stretch with strap x 30 - preferred  Supine R ITB stretch with strap x 30 - painful Unable to achieve R SKTC or R bent knee glute stretch position in supine  Unable to achieve R figure-4 or KTOS piriformis stretch position in hooklying  Mod thomas R quad/hip flexor stretch with strap 2 x 30 Unable to achieve R side-sitting figure-4/pigeon piriformis stretch position in sitting Seated R figure-4 piriformis stretch with foot positioned on 9 stool 2 x 30 Standing R glute stretch with foot on wooden box 2 x 30 Seated B hip ADD ball squeeze isometric 10 x 5, 2 sets Seated RTB B hip ABD/ER clam 10 x 3-5, 2 sets Seated RTB hip flexion march x 10, cues not to lean back when lifting R LE   09/20/2023 - Eval SELF CARE:  Reviewed eval findings and role of PT in addressing identified deficits as well as instruction in ice massage for R lateral epicondylitis (see below).    PATIENT EDUCATION:  Education details: progress with PT, ongoing PT POC, and HEP  update  Person educated: Patient Education method: Explanation, Demonstration, Verbal cues, Tactile cues, and MedBridge[employer] app updated Education comprehension: verbalized understanding, returned demonstration, verbal cues required, tactile cues required, and needs further education  HOME EXERCISE PROGRAM: *Pt using MedBridgeGO app  Access Code: [email or web address] URL: [email or web address] Date: 11/15/2023 Prepared by: Eusebio Saba  Exercises - Seated Hip Flexor Stretch  - 2-3 x daily - 7 x weekly - 3 reps - 30 sec hold - Seated Hamstring Stretch  - 2-3 x daily - 7 x weekly - 3 reps - 30 sec hold - Seated Piriformis Stretch  - 2-3 x daily - 7 x weekly - 3 reps - 30 sec hold - Standing Gluteal Stretch on Chair  - 2-3 x daily - 7 x weekly - 3 reps - 30 sec hold - Standing ITB Stretch (Mirrored)  - 2-3 x daily - 7 x weekly - 3 reps - 30 sec hold - Seated Hip Adduction Squeeze with Ball  - 1 x daily - 7 x weekly - 2 sets - 10 reps - 3-5 sec hold - Seated Hip Abduction with Resistance  - 1 x daily - 7 x weekly - 2 sets - 10 reps - 3 sec hold - Seated March with Resistance  - 1 x daily - 7 x weekly - 2 sets - 10 reps - 3 sec hold - Seated Wrist Extension Stretch  - 2 x daily - 7 x weekly - 3 reps - 30 sec hold - Seated Wrist Flexion Stretch  - 2 x daily - 7 x weekly - 3 reps - 30 sec hold - Putty Squeezes  - 1 x daily - 7 x weekly - 2 sets - 10 reps - 3 sec hold - Seated Eccentric Wrist Flexion with Dumbbell  - 1 x daily - 3 x weekly - 2 sets - 10 reps - 3 sec hold - Seated Eccentric Wrist Extension  - 1 x daily - 3 x weekly - 2 sets - 10 reps - 3 sec hold - Seated Wrist Supination Pronation with Can  - 1 x daily - 3 x weekly - 2 sets - 10 reps - 3 sec hold - Wrist Flexion and Extension with Resistance Bar  - 1 x daily - 3 x weekly - 2  sets - 10 reps - 3 sec hold - Supine Hip Internal and External Rotation  - 1 x daily - 7 x weekly - 2 sets - 10 reps - 3 sec hold - Clamshell  - 1 x daily  - 3 x weekly - 2 sets - 10 reps - 3 sec hold - Standing Hip Abduction with Resistance at Ankles and Counter Support  - 1 x daily - 3-4 x weekly - 2 sets - 10 reps - Standing Hip Extension with Resistance at Ankles and Counter Support  - 1 x daily - 3-4 x weekly - 2 sets - 10 reps - Side Stepping with Resistance at Ankles and Counter Support  - 1 x daily - 3-4 x weekly - 2 sets - 10 reps - Marching with Resistance  - 1 x daily - 3-4 x weekly - 2 sets - 10 reps - Standing Marching  - 1 x daily - 7 x weekly - 2 sets - 10 reps - 2-3s hold  Patient Education - Ice Massage   ASSESSMENT:  CLINICAL IMPRESSION: Tekeyah reports she is doing better with the R hip pain today, reports 5/10 pain. She has still been making time to do her exercises at home but, was unable to do all of them over the weekend d/t family affairs. Pt performed all activities today without any reports of inc'd R hip pain. She was able to increase weight for her squats today and be introduced to more LE strengthening with lunges today. Patient required verbal cuing to ensure proper body mechanics for the lunges but, adjusted and corrected herself well throughout the exercises. She is still having difficulty with SLS activities such as high knee marches as she cannot hold the position for 2-3s at a time without a contralateral lean. Added high knee marching to her HEP to work on strengthening hip flexors to help with this. [place] will continue to benefit from skilled PT intervention to address deficits in order to improve her mobility and activity tolerance w/ dec'd pain interference.    EVAL:  AISHI COURTS is a 63 y.o. female who was referred to physical therapy for evaluation and treatment for R hip and R elbow pain.  Patient reports onset of R hip pain beginning ~1+ year ago.  Pain is worse with prolonged standing or walking, climbing stairs, putting on socks, rolling over in bed and limits sleeping.  R elbow pain with more recent onset  ~3-4 months ago and attributed to secondary to lateral epicondylitis.  Pain is worse with repetitive motion - folding laundry, working/playing on computer, lifting heavy items.  Patient has deficits in R hip ROM, proximal LE flexibility, R>L B LE, UE and grip strength, abnormal posture, and TTP with abnormal muscle tension which are interfering with ADLs and are impacting quality of life.  On LEFS patient scored 19/80 demonstrating moderate functional limitation.  On QuickDASH patient scored 43.2/100 demonstrating 43.2% disability.  [place] will benefit from skilled PT to address above deficits to improve mobility and activity tolerance with decreased pain interference.  OBJECTIVE IMPAIRMENTS: Abnormal gait, decreased activity tolerance, decreased balance, decreased endurance, decreased knowledge of condition, decreased mobility, difficulty walking, decreased ROM, decreased strength, increased fascial restrictions, impaired perceived functional ability, increased muscle spasms, impaired flexibility, impaired UE functional use, improper body mechanics, postural dysfunction, and pain.   ACTIVITY LIMITATIONS: carrying, lifting, sitting, standing, squatting, sleeping, stairs, transfers, bed mobility, dressing, and locomotion level  PARTICIPATION LIMITATIONS: meal prep, cleaning, laundry, driving, shopping, and  community activity  PERSONAL FACTORS: Fitness, Past/current experiences, Time since onset of injury/illness/exacerbation, and 3+ comorbidities: Arthritis, HTN, s/p R CTR 2024, lumbar spondylolisthesis with radiculopathy, s/p L5-S1 MAS PLIF 2016, cervical spondylosis/DDD, cervical radiculopathy, dizziness, LE edema, RTC tendinopathy, obesity, SVT s/p ablation, h/o B knee pain are also affecting patient's functional outcome.   REHAB POTENTIAL: Good  CLINICAL DECISION MAKING: Unstable/unpredictable  EVALUATION COMPLEXITY: High   GOALS: Goals reviewed with patient? Yes  SHORT TERM GOALS: Target date:  11/01/2023  Patient will be independent with initial HEP. Baseline:  10/03/23 - initial LE HEP provided today 10/05/23 - HEP reviewed and modified to improve patient tolerance 10/20/23 - MET for LE HEP Goal status: MET - 10/24/23  2.  Patient will report at least 25% improvement in R hip and elbow pain to improve QOL. Baseline: R hip - 9/10 on eval, up to 10/10 at worst; R elbow - 3/10 on eval, up to 9/10 at worst 10/03/23 - no R elbow pain today Goal status: MET - 10/24/23 - 90% improvement in he R elbow; 40-50% improvement in her R hip  3.  Patient will improve R hip flexion AROM to >/= 90 and R hip ER AROM to >/= 15 to increase ease of car transfers. Baseline: Refer to above LE ROM table Goal status: MET - 10/24/23 - R hip flexion 90, ER 15  LONG TERM GOALS: Target date: 12/13/2023   Patient will be independent with advanced/ongoing HEP to improve outcomes and carryover.  Baseline:  10/24/23 - Met for current HEP, anticipate further updates  Goal status: IN PROGRESS - 11/07/23- Met for current HEP   2.  Patient will report at least 50-75% improvement in R hip and elbow pain to improve QOL. Baseline: R hip - 9/10 on eval, up to 10/10 at worst; R elbow - 3/10 on eval, up to 9/10 at worst Goal status: IN PROGRESS - 10/24/23 - 90% improvement in he R elbow; 40-50% improvement in her R hip  3.  Patient will demonstrate functional pain free R hip ROM to perform ADLs.   Baseline: Refer to above LE ROM table Goal status: IN PROGRESS - 10/24/23 - still limited in most motions  4.  Patient will demonstrate improved B UE and scapular strength to >/= 4 to 4+/5 for functional UE use. Baseline: Refer to above UE MMT table Goal status: IN PROGRESS - 10/24/23 - met except lower traps 3-/5  5.  Patient will demonstrate improved B LE strength to >/= 4 to 4+/5 for improved stability and ease of mobility. Baseline: Refer to above LE MMT table 10/24/23- See chart above Goal status: IN PROGRESS - 11/07/2023-  MMT chart above   6.  Patient will be able to ambulate with normal gait pattern without increased pain and be able to ascend/descend stairs with 1 HR and reciprocal step pattern safely to access home and community.  Baseline:  Goal status: IN PROGRESS- 11/04/23 see under treatment  7. Patient will report >/= 40/80 on LEFS to demonstrate improved functional ability. Baseline: 19 / 80 = 23.8 % Goal status: INITIAL  8.  Patient will report </= 29% on QuickDASH to demonstrate improved functional ability. Baseline: 43.2 / 100 = 43.2 % Goal status: INITIAL  9.  Patient will report <25% sleep disturbance due to pain. Baseline: Both R hip and elbow pain interfere with sleep 10/24/23 - pain interferes with sleep almost every night 20-30% Goal status: IN PROGRESS - 11/15/23 - pain interferes with sleep every  night, waking at least once so, rated 60% today    PLAN:  PT FREQUENCY: 2x/week  PT DURATION: 12 weeks  PLANNED INTERVENTIONS: 97164- PT Re-evaluation, 97750- Physical Performance Testing, 97110-Therapeutic exercises, 97530- Therapeutic activity, 97112- Neuromuscular re-education, 97535- Self Care, 02859- Manual therapy, [phone]- Gait training, [phone]- Aquatic Therapy, [phone]- Electrical stimulation (unattended), 97035- Ultrasound, [id number]- Ionotophoresis 4mg /ml Dexamethasone , 79439 (1-2 muscles), 20561 (3+ muscles)- Dry Needling, Patient/Family education, Balance training, Stair training, Taping, Joint mobilization, Spinal mobilization, DME instructions, Cryotherapy, and Moist heat  PLAN FOR NEXT SESSION: Ensure pt is comfortable with current HEP (expressed concern that she will not have more approved visits after 8/28-discuss 8/21); Continue to incorporate LE and core/lumbopelvic strengthening - update HEP as indicated; progress proximal LE stretching and R hip ROM; MT +/- TPDN and/or modalities/ STM to address abnormal muscle tension and pain - possible trial of ionto patch to R greater trochanter (if  pt reports it is bothering her more)   Eusebio Saba, Student-PT 11/15/2023, 5:18 PM

## 2023-11-16 ENCOUNTER — Telehealth: Payer: Self-pay | Admitting: *Deleted

## 2023-11-16 ENCOUNTER — Other Ambulatory Visit: Payer: Self-pay | Admitting: Family Medicine

## 2023-11-16 NOTE — Telephone Encounter (Unsigned)
 Requesting: alprazolam  0.25mg  Contract: {yes/no:20286} UDS:  Last Visit: Visit date not found Next Visit: Visit date not found Last Refill: ***  Please Advise

## 2023-11-17 ENCOUNTER — Ambulatory Visit: Admitting: Physical Therapy

## 2023-11-17 ENCOUNTER — Encounter: Payer: Self-pay | Admitting: Physical Therapy

## 2023-11-17 DIAGNOSIS — M25551 Pain in right hip: Secondary | ICD-10-CM

## 2023-11-17 DIAGNOSIS — M25521 Pain in right elbow: Secondary | ICD-10-CM

## 2023-11-17 DIAGNOSIS — R2689 Other abnormalities of gait and mobility: Secondary | ICD-10-CM

## 2023-11-17 DIAGNOSIS — M6281 Muscle weakness (generalized): Secondary | ICD-10-CM

## 2023-11-17 MED ORDER — ALPRAZOLAM 0.25 MG PO TABS: ORAL_TABLET | ORAL | 0 refills | Status: DC

## 2023-11-17 NOTE — Addendum Note (Signed)
 Addended by: ESTELLE GILLIS D on: 11/17/2023 09:26 AM   Modules accepted: Orders

## 2023-11-17 NOTE — Therapy (Signed)
 OUTPATIENT PHYSICAL THERAPY TREATMENT     Patient Name: Sherri Sullivan MRN: [id number] DOB:06/25/1960, 63 y.o., female Today's Date: 11/17/2023  END OF SESSION:  PT End of Session - 11/17/23 1315     Visit Number 12    Date for PT Re-Evaluation 12/13/23    Authorization Type [employer]    Authorization Time Period Prior authorization not required    Progress Note Due on Visit 17    PT Start Time 1315    PT Stop Time 1359    PT Time Calculation (min) 44 min    Activity Tolerance Patient tolerated treatment well    Behavior During Therapy WFL for tasks assessed/performed                 Past Medical History:  Diagnosis Date   Anginal pain (HCC)    Arthritis    L shoulder- has had injections, degenerative changes in lumbar spine    Back pain    Chicken pox as a child   Constipation    Dizziness    Dysrhythmia 2013   SVT- treated by ablation by Dr. Waddell to f/u with as needed basis    Encounter for preventative adult health care exam with abnormal findings 02/09/2015   HTN (hypertension)    Hypokalemia 02/09/2015   Joint pain    Leg edema    Measles as a child   Measles as a child   Obesity    Panic attack    during episode of feeling to crowded    Preventative health care 02/09/2015   Seasonal allergies    SOB (shortness of breath) 09/23/2011   a little bit; at rest; before ablation, SOB again now (08/2014- due to lack  of exercise)    SVT (supraventricular tachycardia) (HCC)    Uterine fibroid 12/17/2012   Cervical polyp per patient Follows with [facility], Dr Tanda Mulch   Past Surgical History:  Procedure Laterality Date   ABDOMINAL HYSTERECTOMY  1990's   CARDIAC ELECTROPHYSIOLOGY STUDY AND ABLATION  09/23/11   MAXIMUM ACCESS (MAS)POSTERIOR LUMBAR INTERBODY FUSION (PLIF) 1 LEVEL N/A 09/24/2014   Procedure: L5-S1 MAS PLIF ;  Surgeon: Fairy Levels, MD;  Location: [facility];  Service: Neurosurgery;  Laterality: N/A;  L5-S1 MAS  PLIF fusion   SUPRAVENTRICULAR TACHYCARDIA ABLATION N/A 09/23/2011   Procedure: SUPRAVENTRICULAR TACHYCARDIA ABLATION;  Surgeon: Danelle LELON Waddell, MD;  Location: [facility] CATH LAB;  Service: Cardiovascular;  Laterality: N/A;   TUBAL LIGATION  1980's   Patient Active Problem List   Diagnosis Date Noted   Severe sleep apnea 11/15/2022   Carpal tunnel syndrome 11/01/2022   Numbness of hand 11/01/2022   Cervical spondylosis 11/01/2022   Cervical radiculopathy 11/01/2022   Needs sleep apnea assessment 08/16/2022   H/O colonoscopy with polypectomy 08/16/2022   History of COVID-19 12/06/2021   Dyspnea 11/27/2020   DDD (degenerative disc disease), cervical 11/27/2020   Atypical chest pain 01/18/2019   Acute left-sided thoracic back pain 01/18/2019   Hyperglycemia 01/09/2018   Colon polyp 08/29/2017   Pain in right knee 06/28/2017   Pain in left knee 06/28/2017   Vitamin D  deficiency 02/25/2017   Polyneuropathy 01/21/2016   Hypokalemia 02/09/2015   Encounter for preventative adult health care exam with abnormal findings 02/09/2015   Preventative health care 02/09/2015   Spondylolisthesis of lumbar region 09/24/2014   Spinal stenosis 05/22/2014   Spondylolisthesis at L5-S1 level 05/22/2014   Lumbar radiculopathy 05/22/2014   Neck pain 04/08/2014  Muscle cramps 01/13/2014   Tendinopathy of rotator cuff 11/28/2013   Essential hypertension, benign 11/28/2013   Urinary frequency 08/17/2013   Uterine fibroid 12/17/2012   Obesity    SVT (supraventricular tachycardia) (HCC) 09/21/2011   LOW BACK PAIN 01/04/2008   Hyperlipidemia 02/28/2007   Allergic rhinitis 02/28/2007   KNEE PAIN, LEFT 02/28/2007    PCP: Domenica Harlene LABOR, MD   REFERRING PROVIDER: Joane Artist RAMAN, MD   REFERRING DIAG:  [phone] (ICD-10-CM) - Right elbow pain  M25.551 (ICD-10-CM) - Right hip pain   THERAPY DIAG:  Pain in right hip  Pain in right elbow  Muscle weakness (generalized)  Other abnormalities of gait and  mobility  RATIONALE FOR EVALUATION AND TREATMENT: Rehabilitation  ONSET DATE: R hip ~1 year; R elbow 3-4 months  NEXT MD VISIT: No upcoming appt scheduled at this time (11/07/23)   SUBJECTIVE:   SUBJECTIVE STATEMENT: Pt reports her pain is nothing that I can't bear today. She reports her R elbow pain is gone but still gets the occasional numb/tingly feeling.   EVAL:  Pt reports her R elbow has been getting better since she saw the MD - had been hurting for ~3 months prior to seeing MD.  She has been wearing elbow brace recommended by MD but not sure if she is positioning it properly as it continues to slide down her arm (does not have brace with her on eval).  Pain localized to R lateral epicondyle.  Still has difficulty lifting things with R hand.  R hip has been hurting closer to 1 year and continues to worsen.  Pain originates at lateral hip and radiates into R groin and anterior thigh, but denies numbness or tingling.  She has difficulty lifting her R foot in/out of a car.  PAIN: Are you having pain? Yes: NPRS scale: 6-7/10   Pain location: R lateral hip into R anterior thigh  Pain description: nagging  Aggravating factors: prolonged standing or walking, climbing stairs, putting on socks, rolling over in bed  Relieving factors: nothing so far (tried heat and ice), maybe rubbing motion when applying Aspercreme   Are you having pain? No - lateral epicondyle of R elbow  PERTINENT HISTORY: Arthritis, HTN, s/p R CTR 2024, lumbar spondylolisthesis with radiculopathy, s/p L5-S1 MAS PLIF 2016, cervical spondylosis/DDD, cervical radiculopathy, dizziness, LE edema, RTC tendinopathy, obesity, SVT s/p ablation, h/o B knee pain  PRECAUTIONS: None  HAND DOMINANCE: Right  RED FLAGS: None  WEIGHT BEARING RESTRICTIONS: No  FALLS:  Has patient fallen in last 6 months? No  LIVING ENVIRONMENT: Lives with: lives with their spouse Lives in: House/apartment Stairs: Yes: Internal: 14 x 2  steps; on right going up and External: 3+4 steps; none Has following equipment at home: None  OCCUPATION: Retired  PLOF: Independent and Leisure: binge watching Netflix, light gardening   PATIENT GOALS: Better mobility with less pain.   OBJECTIVE: (objective measures completed at initial evaluation unless otherwise dated)  DIAGNOSTIC FINDINGS:  08/30/23 - Diagnostic Limited MSK Ultrasound of: Right lateral elbow Intact common extensor tendon origin at lateral epicondyle.  Small avulsion fleck present at superficial portion of lateral epicondyle. Increased vascular activity on Doppler consistent with neovascularity. No visible tears present in the tendon. Impression: Lateral epicondylitis  08/30/23 - DG hip IMPRESSION: Moderate to severe right hip degenerative change.  PATIENT SURVEYS:  Quick Dash: 43.2 / 100 = 43.2 % LEFS: 19 / 80 = 23.8 %, moderate functional limitation  COGNITION: Overall cognitive status: Within functional  limits for tasks assessed     SENSATION: WFL  EDEMA:  N/A  POSTURE:  rounded shoulders, forward head, flexed trunk , and weight shift left  PALPATION: Increased muscle tension with TTP over R glutes and piriformis.  Mildly TTP over R greater trochanter.  TTP over R wrist extensor group and lateral epicondyle.  UPPER EXTREMITY ROM:  Grossly [facility]  UPPER EXTREMITY MMT:  MMT Right eval Left eval R 10/24/23 L 10/24/23  Shoulder flexion 4- 4 4+ 4+  Shoulder extension 4 4 4+ 4+  Shoulder abduction 4- 4+ 4+ 4+  Shoulder adduction      Shoulder internal rotation 4+ 4+ 5 5  Shoulder external rotation 4- 4 4+ 4+  Middle trapezius 3+ 4- 4 4  Lower trapezius 3- 3- 3- 3-  Elbow flexion 4- 4+ 4+ 5  Elbow extension 4- 4- 4+ 4+  Wrist flexion      Wrist extension      Wrist ulnar deviation      Wrist radial deviation      Wrist pronation      Wrist supination      Grip strength (lbs) 13.67# 26.33# 24.33# 29.33#  (Blank rows = not tested)  MUSCLE  LENGTH: Hamstrings: mildly tight L>R ITB: mod tight B Piriformis: severe tight R>L Hip flexors: mod tight B Quads: mild tight B Heelcord:   LOWER EXTREMITY ROM:  Active ROM Right eval Left eval R 10/24/23 L 10/24/23 R 11/04/23  Hip flexion 74 91 82 94 90  Hip extension       Hip abduction       Hip adduction       Hip internal rotation 39 44 38 44   Hip external rotation 7 27 20  32 15  Knee flexion       Knee extension       Ankle dorsiflexion       Ankle plantarflexion       Ankle inversion       Ankle eversion        (Blank rows = not tested)  LOWER EXTREMITY MMT:  MMT Right eval Left eval R 10/24/23 L 10/24/23 R 11/07/23 L 11/07/23  Hip flexion 3+ p! 4- 4- p! 4+ 4- p!  4+  Hip extension 3- 3- 3+ 3+ 3+ 3+  Hip abduction 3- 3+ 3+ 4 4 4   Hip adduction 2+ 3- 3+ limited ROM 4- 3+; limited ROM 4-  Hip internal rotation 4 4+ 4+ 4+ 4+ 4+  Hip external rotation 2 4- 4- limited ROM 4 limited ROM 4+ 4+  Knee flexion 4- 4 4 4 4  4+  Knee extension 4- 4 4+ 4+ 4 4+  Ankle dorsiflexion 4- 4 4+ 4+ 4+ 4+  Ankle plantarflexion        Ankle inversion        Ankle eversion         (Blank rows = not tested)  LOWER EXTREMITY SPECIAL TESTS:  Hip special tests: Belvie (FABER) test: positive , Thomas test: positive , and Ober's test: negative  FUNCTIONAL TESTS:  5 times sit to stand: 27.40 sec  TRANSFERS: Assistive device utilized: None  Sit to stand: Complete Independence and Modified independence -typically relies on UE assist but able to complete sit to stand without UE assist although tendency for weight shift to L with R genu valgum Stand to sit: Complete Independence and Modified independence Chair to chair: Modified independence Floor: NT   TODAY'S TREATMENT:  11/17/2023  THERAPEUTIC EXERCISE: To improve strength, endurance, ROM, and flexibility.  Demonstration, verbal and tactile cues throughout for technique.  Rec Bike - L3 x 6 min  Hooklying B hip IR/ER rotation x  10 Hooklying B butterfly hip adductor stretch 3 x 30 Supine R hip adductor stretch with strap 3 x 30 Seated R hip adductor stretch x 30 - better stretch with supine stretches  THERAPEUTIC ACTIVITIES: To improve functional performance.  Demonstration, verbal and tactile cues throughout for technique. QuickDASH: 2.3 / 100 = 2.3 % LEFS: 49 / 80 = 61.3 %  NEUROMUSCULAR RE-EDUCATION: To improve coordination and kinesthesia. Bridge + GTB hip ABD/ER clam (only moving as far as she can keep hips level) x 10  MODALITIES:  Iontophoresis with 1.0 mL 4mg /ml Dexamethasone  - 4-6 hour patch (71mA-min) to R greater trochanter (patch #1 of 6)   11/15/23 THERAPEUTIC EXERCISE: To improve strength, ROM, and flexibility.   NuStep L5x22min  Standing hip ext x15 B w/ RTB at ankles  Standing hip abd 2x10 RTB  Standing Marches 1# B, 2x30 Alt hip flexion marches w/ 2-3s holds 2x10  THERAPEUTIC ACTIVITIES: To improve functional performance.  Demonstration, verbal and tactile cues throughout for technique. Functional Squats w/ mirror w/ 10# kb on 9' stool 2x10  Stationary lunge x10  Stationary lunge w/ contra 5# kb x10 B- audible popping from L knee  Fwd Step Ups + contra load 5# kb  Lateral Step ups contra load 5# kb 2x10 B (alt lead foot)    11/10/23 THERAPEUTIC EXERCISE: To improve strength, ROM, and flexibility.   Rec Bike- L2x85min  Seated piriformis stretch w/ foot on 9 stool, 2x30 RLE  Standing hip ext 2x10 B RTB at ankles  Standing hip abd 2x10 B RTB at ankles  Seated Marches w/ RTB under forefoot 2x10- pt expressed more difficulty on R side  THERAPEUTIC ACTIVITIES: To improve functional performance.  Demonstration, verbal and tactile cues throughout for technique. Functional Squats w/ mirror w/ 5# kb on 9' stool 2x10  Fwd Step Ups on 4 2 x10- RLE leading; 2nd set caused pain in R hip  Fwd step up + high knee march 4' x5 B- expressed difficulty  Lateral Step Ups and Overs onto 4 step- 2x10  B    11/07/2023  THERAPEUTIC EXERCISE: To improve strength, ROM, and flexibility.   NuStep L5x6 min  Seated Piriformis Stretch w/ foot on 9 stool- 2x30 RLE  LE MMT (LTG 5)  Side Stepping w/ RTB at distal thighs 2x20' Standing Hip Ext x15 B  Standing Hip Add x15 B THERAPEUTIC ACTIVITIES: To improve functional performance.  Demonstration, verbal and tactile cues throughout for technique. Functional Squats w/ 5# kb on 9 stool- lifting mechanics provided before  Fwd Step ups onto 4 2x1'- pt reports R hip feels wobbly on the step today    11/04/2023  THERAPEUTIC EXERCISE: To improve strength, ROM, and flexibility.  Demonstration, verbal and tactile cues throughout for technique. NuStep L5 x 6 min  Seated piriformis stretch on 9' stool 2x30 Seated gluteal lunge stretch on 9' stool 2x30  THERAPEUTIC ACTIVITIES: To improve functional performance.  Demonstration, verbal and tactile cues throughout for technique. Stairs assessed; ascending pt leans to L side and pulls herself up on RLE; no issues with descending Standing hip abduction x 20 BLE; YTB at ankles x 10 Standing hip extension 10 BLE; YTB at ankles x 10  Side stepping YTB at ankles 3x along counter Standing marching YTB at forefoot x 10 BLE  Fwd stepping over pool noodle x 15 BLE- R foot started to drag Lateral stepping over pool noodle x 15 BLE   PATIENT EDUCATION:  Education details: HEP update and Ionto patch wearing instructions  Person educated: Patient Education method: Explanation, Demonstration, Verbal cues, Tactile cues, Handouts, and MedBridgeGO app updated Education comprehension: verbalized understanding, returned demonstration, verbal cues required, tactile cues required, and needs further education  HOME EXERCISE PROGRAM: *Pt using MedBridgeGO app  Access Code: [email or web address] URL: [email or web address] Date: 11/17/2023 Prepared by: Elijah Hidden  Exercises - Seated Wrist Extension Stretch  - 2 x  daily - 7 x weekly - 3 reps - 30 sec hold - Seated Wrist Flexion Stretch  - 2 x daily - 7 x weekly - 3 reps - 30 sec hold - Seated Hip Flexor Stretch  - 2-3 x daily - 7 x weekly - 3 reps - 30 sec hold - Putty Squeezes  - 1 x daily - 7 x weekly - 2 sets - 10 reps - 3 sec hold - Seated Hamstring Stretch  - 2-3 x daily - 7 x weekly - 3 reps - 30 sec hold - Seated Piriformis Stretch  - 2-3 x daily - 7 x weekly - 3 reps - 30 sec hold - Standing Gluteal Stretch on Chair  - 2-3 x daily - 7 x weekly - 3 reps - 30 sec hold - Standing ITB Stretch (Mirrored)  - 2-3 x daily - 7 x weekly - 3 reps - 30 sec hold - Seated Hip Adduction Squeeze with Ball  - 1 x daily - 7 x weekly - 2 sets - 10 reps - 3-5 sec hold - Seated Hip Abduction with Resistance  - 1 x daily - 7 x weekly - 2 sets - 10 reps - 3 sec hold - Seated Eccentric Wrist Flexion with Dumbbell  - 1 x daily - 3 x weekly - 2 sets - 10 reps - 3 sec hold - Seated Eccentric Wrist Extension  - 1 x daily - 3 x weekly - 2 sets - 10 reps - 3 sec hold - Seated Wrist Supination Pronation with Can  - 1 x daily - 3 x weekly - 2 sets - 10 reps - 3 sec hold - Wrist Flexion and Extension with Resistance Bar  - 1 x daily - 3 x weekly - 2 sets - 10 reps - 3 sec hold - Supine Hip Internal and External Rotation  - 1 x daily - 7 x weekly - 2 sets - 10 reps - 3 sec hold - Clamshell  - 1 x daily - 3 x weekly - 2 sets - 10 reps - 3 sec hold - Standing Hip Abduction with Resistance at Ankles and Counter Support  - 1 x daily - 3-4 x weekly - 2 sets - 10 reps - Standing Hip Extension with Resistance at Ankles and Counter Support  - 1 x daily - 3-4 x weekly - 2 sets - 10 reps - Side Stepping with Resistance at Ankles and Counter Support  - 1 x daily - 3-4 x weekly - 2 sets - 10 reps - Marching with Resistance  - 1 x daily - 3-4 x weekly - 2 sets - 10 reps - Standing Marching  - 1 x daily - 7 x weekly - 2 sets - 10 reps - 2-3s hold - Supine Hip Adductor Stretch  - 2 x daily - 7 x  weekly - 3 reps - 30 sec hold -  Hip Adductors and Hamstring Stretch with Strap  - 2 x daily - 7 x weekly - 3 reps - 30 sec hold - Bridge with Hip Abduction and Resistance - Ground Touches  - 1 x daily - 3 x weekly - 2 sets - 10 reps - 3 sec hold  Patient Education - Ice Massage - Ionto Pt Instructions - [facility]   ASSESSMENT:  CLINICAL IMPRESSION: Sherri Sullivan continues to report moderate pain levels but states her pain is nothing that I can't bear. R hip flexibility and ROM gradually improving but still significantly limited in R hip ER - added hip adductor stretches to help improve flexibility for restoration of functional ROM.  Bridges introduced with GTB hip ABD/ER clam to increase glute and hip rotator muscle activation.  Session concluded with initial trial of ionto patch to R greater trochanter.  Sherri Sullivan will benefit from continued skilled PT to address ongoing ROM/flexibility and strength deficits to improve mobility and activity tolerance with decreased pain interference.   EVAL:  Sherri Sullivan is a 63 y.o. female who was referred to physical therapy for evaluation and treatment for R hip and R elbow pain.  Patient reports onset of R hip pain beginning ~1+ year ago.  Pain is worse with prolonged standing or walking, climbing stairs, putting on socks, rolling over in bed and limits sleeping.  R elbow pain with more recent onset ~3-4 months ago and attributed to secondary to lateral epicondylitis.  Pain is worse with repetitive motion - folding laundry, working/playing on computer, lifting heavy items.  Patient has deficits in R hip ROM, proximal LE flexibility, R>L B LE, UE and grip strength, abnormal posture, and TTP with abnormal muscle tension which are interfering with ADLs and are impacting quality of life.  On LEFS patient scored 19/80 demonstrating moderate functional limitation.  On QuickDASH patient scored 43.2/100 demonstrating 43.2% disability.  Sherri Sullivan will benefit from skilled PT to address  above deficits to improve mobility and activity tolerance with decreased pain interference.  OBJECTIVE IMPAIRMENTS: Abnormal gait, decreased activity tolerance, decreased balance, decreased endurance, decreased knowledge of condition, decreased mobility, difficulty walking, decreased ROM, decreased strength, increased fascial restrictions, impaired perceived functional ability, increased muscle spasms, impaired flexibility, impaired UE functional use, improper body mechanics, postural dysfunction, and pain.   ACTIVITY LIMITATIONS: carrying, lifting, sitting, standing, squatting, sleeping, stairs, transfers, bed mobility, dressing, and locomotion level  PARTICIPATION LIMITATIONS: meal prep, cleaning, laundry, driving, shopping, and community activity  PERSONAL FACTORS: Fitness, Past/current experiences, Time since onset of injury/illness/exacerbation, and 3+ comorbidities: Arthritis, HTN, s/p R CTR 2024, lumbar spondylolisthesis with radiculopathy, s/p L5-S1 MAS PLIF 2016, cervical spondylosis/DDD, cervical radiculopathy, dizziness, LE edema, RTC tendinopathy, obesity, SVT s/p ablation, h/o B knee pain are also affecting patient's functional outcome.   REHAB POTENTIAL: Good  CLINICAL DECISION MAKING: Unstable/unpredictable  EVALUATION COMPLEXITY: High   GOALS: Goals reviewed with patient? Yes  SHORT TERM GOALS: Target date: 11/01/2023  Patient will be independent with initial HEP. Baseline:  10/03/23 - initial LE HEP provided today 10/05/23 - HEP reviewed and modified to improve patient tolerance 10/20/23 - MET for LE HEP Goal status: MET - 10/24/23  2.  Patient will report at least 25% improvement in R hip and elbow pain to improve QOL. Baseline: R hip - 9/10 on eval, up to 10/10 at worst; R elbow - 3/10 on eval, up to 9/10 at worst 10/03/23 - no R elbow pain today Goal status: MET - 10/24/23 - 90% improvement in he R  elbow; 40-50% improvement in her R hip  3.  Patient will improve R hip  flexion AROM to >/= 90 and R hip ER AROM to >/= 15 to increase ease of car transfers. Baseline: Refer to above LE ROM table Goal status: MET - 10/24/23 - R hip flexion 90, ER 15  LONG TERM GOALS: Target date: 12/13/2023   Patient will be independent with advanced/ongoing HEP to improve outcomes and carryover.  Baseline:  10/24/23 - Met for current HEP, anticipate further updates  Goal status: IN PROGRESS - 11/07/23- Met for current HEP   2.  Patient will report at least 50-75% improvement in R hip and elbow pain to improve QOL. Baseline: R hip - 9/10 on eval, up to 10/10 at worst; R elbow - 3/10 on eval, up to 9/10 at worst Goal status: IN PROGRESS - 10/24/23 - 90% improvement in he R elbow; 40-50% improvement in her R hip  3.  Patient will demonstrate functional pain free R hip ROM to perform ADLs.   Baseline: Refer to above LE ROM table Goal status: IN PROGRESS - 10/24/23 - still limited in most motions  4.  Patient will demonstrate improved B UE and scapular strength to >/= 4 to 4+/5 for functional UE use. Baseline: Refer to above UE MMT table Goal status: IN PROGRESS - 10/24/23 - met except lower traps 3-/5  5.  Patient will demonstrate improved B LE strength to >/= 4 to 4+/5 for improved stability and ease of mobility. Baseline: Refer to above LE MMT table 10/24/23- See chart above Goal status: IN PROGRESS - 11/07/2023- MMT chart above   6.  Patient will be able to ambulate with normal gait pattern without increased pain and be able to ascend/descend stairs with 1 HR and reciprocal step pattern safely to access home and community.  Baseline:  Goal status: IN PROGRESS - 11/04/23 - ascending pt leans to L side and pulls herself up on RLE; no issues with descending  7. Patient will report >/= 40/80 on LEFS to demonstrate improved functional ability. Baseline: 19 / 80 = 23.8 % Goal status: MET - 11/17/23 - 49 / 80 = 61.3 %  8.  Patient will report </= 29% on QuickDASH to demonstrate  improved functional ability. Baseline: 43.2 / 100 = 43.2 % Goal status: MET - 11/17/23 - 2.3 / 100 = 2.3 %  9.  Patient will report <25% sleep disturbance due to pain. Baseline: Both R hip and elbow pain interfere with sleep 10/24/23 - pain interferes with sleep almost every night 20-30% Goal status: IN PROGRESS - 11/15/23 - pain interferes with sleep every night, waking at least once so, rated 60% today    PLAN:  PT FREQUENCY: 2x/week  PT DURATION: 12 weeks  PLANNED INTERVENTIONS: [id number]- PT Re-evaluation, 97750- Physical Performance Testing, 97110-Therapeutic exercises, 97530- Therapeutic activity, 97112- Neuromuscular re-education, 97535- Self Care, [id number]- Manual therapy, [phone]- Gait training, [phone]- Aquatic Therapy, [phone]- Electrical stimulation (unattended), 97035- Ultrasound, 02966- Ionotophoresis 4mg /ml Dexamethasone , 79439 (1-2 muscles), [id number] (3+ muscles)- Dry Needling, Patient/Family education, Balance training, Stair training, Taping, Joint mobilization, Spinal mobilization, DME instructions, Cryotherapy, and Moist heat  PLAN FOR NEXT SESSION: Assess response to initial ionto patch; progress proximal LE stretching and R hip ROM; continue to incorporate LE and core/lumbopelvic strengthening - update HEP as indicated; MT +/- TPDN and/or modalities/ STM to address abnormal muscle tension and pain - ionto patch #2 of 6 to R greater trochanter if benefit noted or trial of kinesiotaping  if no benefit from [facility], PT 11/17/2023, 5:25 PM

## 2023-11-21 ENCOUNTER — Encounter: Payer: Self-pay | Admitting: Physical Therapy

## 2023-11-21 ENCOUNTER — Ambulatory Visit: Admitting: Physical Therapy

## 2023-11-21 DIAGNOSIS — M25551 Pain in right hip: Secondary | ICD-10-CM | POA: Diagnosis not present

## 2023-11-21 DIAGNOSIS — M25521 Pain in right elbow: Secondary | ICD-10-CM

## 2023-11-21 DIAGNOSIS — M6281 Muscle weakness (generalized): Secondary | ICD-10-CM

## 2023-11-21 DIAGNOSIS — R2689 Other abnormalities of gait and mobility: Secondary | ICD-10-CM

## 2023-11-21 NOTE — Therapy (Signed)
 OUTPATIENT PHYSICAL THERAPY TREATMENT     Patient Name: Sherri Sullivan MRN: [id number] DOB:10/08/1960, 63 y.o., female Today's Date: 11/21/2023  END OF SESSION:  PT End of Session - 11/21/23 1313     Visit Number 13    Date for PT Re-Evaluation 12/13/23    Authorization Type [employer]    Authorization Time Period Prior authorization not required    Progress Note Due on Visit 17    PT Start Time 1313    PT Stop Time 1353    PT Time Calculation (min) 40 min    Activity Tolerance Patient tolerated treatment well    Behavior During Therapy WFL for tasks assessed/performed                  Past Medical History:  Diagnosis Date   Anginal pain (HCC)    Arthritis    L shoulder- has had injections, degenerative changes in lumbar spine    Back pain    Chicken pox as a child   Constipation    Dizziness    Dysrhythmia 2013   SVT- treated by ablation by Dr. Waddell to f/u with as needed basis    Encounter for preventative adult health care exam with abnormal findings 02/09/2015   HTN (hypertension)    Hypokalemia 02/09/2015   Joint pain    Leg edema    Measles as a child   Measles as a child   Obesity    Panic attack    during episode of feeling to crowded    Preventative health care 02/09/2015   Seasonal allergies    SOB (shortness of breath) 09/23/2011   a little bit; at rest; before ablation, SOB again now (08/2014- due to lack  of exercise)    SVT (supraventricular tachycardia) (HCC)    Uterine fibroid 12/17/2012   Cervical polyp per patient Follows with [facility], Dr Tanda Mulch   Past Surgical History:  Procedure Laterality Date   ABDOMINAL HYSTERECTOMY  1990's   CARDIAC ELECTROPHYSIOLOGY STUDY AND ABLATION  09/23/11   MAXIMUM ACCESS (MAS)POSTERIOR LUMBAR INTERBODY FUSION (PLIF) 1 LEVEL N/A 09/24/2014   Procedure: L5-S1 MAS PLIF ;  Surgeon: Fairy Levels, MD;  Location: [facility];  Service: Neurosurgery;  Laterality: N/A;  L5-S1 MAS  PLIF fusion   SUPRAVENTRICULAR TACHYCARDIA ABLATION N/A 09/23/2011   Procedure: SUPRAVENTRICULAR TACHYCARDIA ABLATION;  Surgeon: Danelle LELON Waddell, MD;  Location: [facility] CATH LAB;  Service: Cardiovascular;  Laterality: N/A;   TUBAL LIGATION  1980's   Patient Active Problem List   Diagnosis Date Noted   Severe sleep apnea 11/15/2022   Carpal tunnel syndrome 11/01/2022   Numbness of hand 11/01/2022   Cervical spondylosis 11/01/2022   Cervical radiculopathy 11/01/2022   Needs sleep apnea assessment 08/16/2022   H/O colonoscopy with polypectomy 08/16/2022   History of COVID-19 12/06/2021   Dyspnea 11/27/2020   DDD (degenerative disc disease), cervical 11/27/2020   Atypical chest pain 01/18/2019   Acute left-sided thoracic back pain 01/18/2019   Hyperglycemia 01/09/2018   Colon polyp 08/29/2017   Pain in right knee 06/28/2017   Pain in left knee 06/28/2017   Vitamin D  deficiency 02/25/2017   Polyneuropathy 01/21/2016   Hypokalemia 02/09/2015   Encounter for preventative adult health care exam with abnormal findings 02/09/2015   Preventative health care 02/09/2015   Spondylolisthesis of lumbar region 09/24/2014   Spinal stenosis 05/22/2014   Spondylolisthesis at L5-S1 level 05/22/2014   Lumbar radiculopathy 05/22/2014   Neck pain  04/08/2014   Muscle cramps 01/13/2014   Tendinopathy of rotator cuff 11/28/2013   Essential hypertension, benign 11/28/2013   Urinary frequency 08/17/2013   Uterine fibroid 12/17/2012   Obesity    SVT (supraventricular tachycardia) (HCC) 09/21/2011   LOW BACK PAIN 01/04/2008   Hyperlipidemia 02/28/2007   Allergic rhinitis 02/28/2007   KNEE PAIN, LEFT 02/28/2007    PCP: Domenica Harlene LABOR, MD   REFERRING PROVIDER: Joane Artist RAMAN, MD   REFERRING DIAG:  [id number] (ICD-10-CM) - Right elbow pain  M25.551 (ICD-10-CM) - Right hip pain   THERAPY DIAG:  No diagnosis found.  RATIONALE FOR EVALUATION AND TREATMENT: Rehabilitation  ONSET DATE: R hip ~1 year; R  elbow 3-4 months  NEXT MD VISIT: No upcoming appt scheduled at this time (11/07/23)   SUBJECTIVE:   SUBJECTIVE STATEMENT: Pt reports 1-2 days relief from the ionto patch but then the pain came back.   EVAL:  Pt reports her R elbow has been getting better since she saw the MD - had been hurting for ~3 months prior to seeing MD.  She has been wearing elbow brace recommended by MD but not sure if she is positioning it properly as it continues to slide down her arm (does not have brace with her on eval).  Pain localized to R lateral epicondyle.  Still has difficulty lifting things with R hand.  R hip has been hurting closer to 1 year and continues to worsen.  Pain originates at lateral hip and radiates into R groin and anterior thigh, but denies numbness or tingling.  She has difficulty lifting her R foot in/out of a car.  PAIN: Are you having pain? Yes: NPRS scale: 5/10   Pain location: R lateral hip into R anterior thigh  Pain description: nagging  Aggravating factors: prolonged standing or walking, climbing stairs, putting on socks, rolling over in bed  Relieving factors: nothing so far (tried heat and ice), maybe rubbing motion when applying Aspercreme   Are you having pain? No - lateral epicondyle of R elbow  PERTINENT HISTORY: Arthritis, HTN, s/p R CTR 2024, lumbar spondylolisthesis with radiculopathy, s/p L5-S1 MAS PLIF 2016, cervical spondylosis/DDD, cervical radiculopathy, dizziness, LE edema, RTC tendinopathy, obesity, SVT s/p ablation, h/o B knee pain  PRECAUTIONS: None  HAND DOMINANCE: Right  RED FLAGS: None  WEIGHT BEARING RESTRICTIONS: No  FALLS:  Has patient fallen in last 6 months? No  LIVING ENVIRONMENT: Lives with: lives with their spouse Lives in: House/apartment Stairs: Yes: Internal: 14 x 2 steps; on right going up and External: 3+4 steps; none Has following equipment at home: None  OCCUPATION: Retired  PLOF: Independent and Leisure: binge watching Netflix,  light gardening   PATIENT GOALS: Better mobility with less pain.   OBJECTIVE: (objective measures completed at initial evaluation unless otherwise dated)  DIAGNOSTIC FINDINGS:  08/30/23 - Diagnostic Limited MSK Ultrasound of: Right lateral elbow Intact common extensor tendon origin at lateral epicondyle.  Small avulsion fleck present at superficial portion of lateral epicondyle. Increased vascular activity on Doppler consistent with neovascularity. No visible tears present in the tendon. Impression: Lateral epicondylitis  08/30/23 - DG hip IMPRESSION: Moderate to severe right hip degenerative change.  PATIENT SURVEYS:  Quick Dash: 43.2 / 100 = 43.2 % LEFS: 19 / 80 = 23.8 %, moderate functional limitation  COGNITION: Overall cognitive status: Within functional limits for tasks assessed     SENSATION: WFL  EDEMA:  N/A  POSTURE:  rounded shoulders, forward head, flexed trunk , and  weight shift left  PALPATION: Increased muscle tension with TTP over R glutes and piriformis.  Mildly TTP over R greater trochanter.  TTP over R wrist extensor group and lateral epicondyle.  UPPER EXTREMITY ROM:  Grossly [facility]  UPPER EXTREMITY MMT:  MMT Right eval Left eval R 10/24/23 L 10/24/23  Shoulder flexion 4- 4 4+ 4+  Shoulder extension 4 4 4+ 4+  Shoulder abduction 4- 4+ 4+ 4+  Shoulder adduction      Shoulder internal rotation 4+ 4+ 5 5  Shoulder external rotation 4- 4 4+ 4+  Middle trapezius 3+ 4- 4 4  Lower trapezius 3- 3- 3- 3-  Elbow flexion 4- 4+ 4+ 5  Elbow extension 4- 4- 4+ 4+  Wrist flexion      Wrist extension      Wrist ulnar deviation      Wrist radial deviation      Wrist pronation      Wrist supination      Grip strength (lbs) 13.67# 26.33# 24.33# 29.33#  (Blank rows = not tested)  MUSCLE LENGTH: Hamstrings: mildly tight L>R ITB: mod tight B Piriformis: severe tight R>L Hip flexors: mod tight B Quads: mild tight B Heelcord:   LOWER EXTREMITY ROM:  Active  ROM Right eval Left eval R 10/24/23 L 10/24/23 R 11/04/23  Hip flexion 74 91 82 94 90  Hip extension       Hip abduction       Hip adduction       Hip internal rotation 39 44 38 44   Hip external rotation 7 27 20  32 15  Knee flexion       Knee extension       Ankle dorsiflexion       Ankle plantarflexion       Ankle inversion       Ankle eversion        (Blank rows = not tested)  LOWER EXTREMITY MMT:  MMT Right eval Left eval R 10/24/23 L 10/24/23 R 11/07/23 L 11/07/23  Hip flexion 3+ p! 4- 4- p! 4+ 4- p!  4+  Hip extension 3- 3- 3+ 3+ 3+ 3+  Hip abduction 3- 3+ 3+ 4 4 4   Hip adduction 2+ 3- 3+ limited ROM 4- 3+; limited ROM 4-  Hip internal rotation 4 4+ 4+ 4+ 4+ 4+  Hip external rotation 2 4- 4- limited ROM 4 limited ROM 4+ 4+  Knee flexion 4- 4 4 4 4  4+  Knee extension 4- 4 4+ 4+ 4 4+  Ankle dorsiflexion 4- 4 4+ 4+ 4+ 4+  Ankle plantarflexion        Ankle inversion        Ankle eversion         (Blank rows = not tested)  LOWER EXTREMITY SPECIAL TESTS:  Hip special tests: Belvie (FABER) test: positive , Thomas test: positive , and Ober's test: negative  FUNCTIONAL TESTS:  5 times sit to stand: 27.40 sec  TRANSFERS: Assistive device utilized: None  Sit to stand: Complete Independence and Modified independence -typically relies on UE assist but able to complete sit to stand without UE assist although tendency for weight shift to L with R genu valgum Stand to sit: Complete Independence and Modified independence Chair to chair: Modified independence Floor: NT   TODAY'S TREATMENT:  11/21/2023 THERAPEUTIC EXERCISE: To improve strength, endurance, ROM, and flexibility.  Demonstration, verbal and tactile cues throughout for technique.  NuStep - L5 x 6 min (UE/LE)  Modified R pigeon pose piriformis/hip ER stretch (side-sitting on elevated table with foot supported on seat of chair) 3 x 30  NEUROMUSCULAR RE-EDUCATION: To improve balance, coordination, kinesthesia, and  proprioception. Standing Fitter extension (1 black/1 blue) 2 x 10, UE support on back of chair for balance  Standing Fitter abduction (1 black/1 blue) 2 x 10, UE support on back of chair for balance Standing hip flexion/ABD/ER arcs with 2# ankle weights 2 x 10 on L, 2 x 8 on R; UE support on back of chair for balance  MODALITIES:  Iontophoresis with 1.0 mL 4mg /ml Dexamethasone  - 4-6 hour patch (40mA-min) to R greater trochanter (patch #2 of 6)    11/17/2023  THERAPEUTIC EXERCISE: To improve strength, endurance, ROM, and flexibility.  Demonstration, verbal and tactile cues throughout for technique.  Rec Bike - L3 x 6 min  Hooklying B hip IR/ER rotation x 10 Hooklying B butterfly hip adductor stretch 3 x 30 Supine R hip adductor stretch with strap 3 x 30 Seated R hip adductor stretch x 30 - better stretch with supine stretches  THERAPEUTIC ACTIVITIES: To improve functional performance.  Demonstration, verbal and tactile cues throughout for technique. QuickDASH: 2.3 / 100 = 2.3 % LEFS: 49 / 80 = 61.3 %  NEUROMUSCULAR RE-EDUCATION: To improve coordination and kinesthesia. Bridge + GTB hip ABD/ER clam (only moving as far as she can keep hips level) x 10  MODALITIES:  Iontophoresis with 1.0 mL 4mg /ml Dexamethasone  - 4-6 hour patch (37mA-min) to R greater trochanter (patch #1 of 6)   11/15/23 THERAPEUTIC EXERCISE: To improve strength, ROM, and flexibility.   NuStep L5x56min  Standing hip ext x15 B w/ RTB at ankles  Standing hip abd 2x10 RTB  Standing Marches 1# B, 2x30 Alt hip flexion marches w/ 2-3s holds 2x10  THERAPEUTIC ACTIVITIES: To improve functional performance.  Demonstration, verbal and tactile cues throughout for technique. Functional Squats w/ mirror w/ 10# kb on 9' stool 2x10  Stationary lunge x10  Stationary lunge w/ contra 5# kb x10 B- audible popping from L knee  Fwd Step Ups + contra load 5# kb  Lateral Step ups contra load 5# kb 2x10 B (alt lead foot)     11/10/23 THERAPEUTIC EXERCISE: To improve strength, ROM, and flexibility.   Rec Bike- L2x81min  Seated piriformis stretch w/ foot on 9 stool, 2x30 RLE  Standing hip ext 2x10 B RTB at ankles  Standing hip abd 2x10 B RTB at ankles  Seated Marches w/ RTB under forefoot 2x10- pt expressed more difficulty on R side  THERAPEUTIC ACTIVITIES: To improve functional performance.  Demonstration, verbal and tactile cues throughout for technique. Functional Squats w/ mirror w/ 5# kb on 9' stool 2x10  Fwd Step Ups on 4 2 x10- RLE leading; 2nd set caused pain in R hip  Fwd step up + high knee march 4' x5 B- expressed difficulty  Lateral Step Ups and Overs onto 4 step- 2x10 B    11/07/2023  THERAPEUTIC EXERCISE: To improve strength, ROM, and flexibility.   NuStep L5x6 min  Seated Piriformis Stretch w/ foot on 9 stool- 2x30 RLE  LE MMT (LTG 5)  Side Stepping w/ RTB at distal thighs 2x20' Standing Hip Ext x15 B  Standing Hip Add x15 B THERAPEUTIC ACTIVITIES: To improve functional performance.  Demonstration, verbal and tactile cues throughout for technique. Functional Squats w/ 5# kb on 9 stool- lifting mechanics provided before  Fwd Step ups onto 4 2x1'- pt reports R hip feels wobbly  on the step today    11/04/2023  THERAPEUTIC EXERCISE: To improve strength, ROM, and flexibility.  Demonstration, verbal and tactile cues throughout for technique. NuStep L5 x 6 min  Seated piriformis stretch on 9' stool 2x30 Seated gluteal lunge stretch on 9' stool 2x30  THERAPEUTIC ACTIVITIES: To improve functional performance.  Demonstration, verbal and tactile cues throughout for technique. Stairs assessed; ascending pt leans to L side and pulls herself up on RLE; no issues with descending Standing hip abduction x 20 BLE; YTB at ankles x 10 Standing hip extension 10 BLE; YTB at ankles x 10  Side stepping YTB at ankles 3x along counter Standing marching YTB at forefoot x 10 BLE Fwd stepping over pool  noodle x 15 BLE- R foot started to drag Lateral stepping over pool noodle x 15 BLE   PATIENT EDUCATION:  Education details: Ionto patch wearing instructions  Person educated: Patient Education method: Explanation Education comprehension: verbalized understanding  HOME EXERCISE PROGRAM: *Pt using MedBridgeGO app  Access Code: [email or web address] URL: [email or web address] Date: 11/17/2023 Prepared by: Elijah Hidden  Exercises - Seated Wrist Extension Stretch  - 2 x daily - 7 x weekly - 3 reps - 30 sec hold - Seated Wrist Flexion Stretch  - 2 x daily - 7 x weekly - 3 reps - 30 sec hold - Seated Hip Flexor Stretch  - 2-3 x daily - 7 x weekly - 3 reps - 30 sec hold - Putty Squeezes  - 1 x daily - 7 x weekly - 2 sets - 10 reps - 3 sec hold - Seated Hamstring Stretch  - 2-3 x daily - 7 x weekly - 3 reps - 30 sec hold - Seated Piriformis Stretch  - 2-3 x daily - 7 x weekly - 3 reps - 30 sec hold - Standing Gluteal Stretch on Chair  - 2-3 x daily - 7 x weekly - 3 reps - 30 sec hold - Standing ITB Stretch (Mirrored)  - 2-3 x daily - 7 x weekly - 3 reps - 30 sec hold - Seated Hip Adduction Squeeze with Ball  - 1 x daily - 7 x weekly - 2 sets - 10 reps - 3-5 sec hold - Seated Hip Abduction with Resistance  - 1 x daily - 7 x weekly - 2 sets - 10 reps - 3 sec hold - Seated Eccentric Wrist Flexion with Dumbbell  - 1 x daily - 3 x weekly - 2 sets - 10 reps - 3 sec hold - Seated Eccentric Wrist Extension  - 1 x daily - 3 x weekly - 2 sets - 10 reps - 3 sec hold - Seated Wrist Supination Pronation with Can  - 1 x daily - 3 x weekly - 2 sets - 10 reps - 3 sec hold - Wrist Flexion and Extension with Resistance Bar  - 1 x daily - 3 x weekly - 2 sets - 10 reps - 3 sec hold - Supine Hip Internal and External Rotation  - 1 x daily - 7 x weekly - 2 sets - 10 reps - 3 sec hold - Clamshell  - 1 x daily - 3 x weekly - 2 sets - 10 reps - 3 sec hold - Standing Hip Abduction with Resistance at Ankles and  Counter Support  - 1 x daily - 3-4 x weekly - 2 sets - 10 reps - Standing Hip Extension with Resistance at Ankles and Counter Support  - 1 x daily - 3-4 x  weekly - 2 sets - 10 reps - Side Stepping with Resistance at Ankles and Counter Support  - 1 x daily - 3-4 x weekly - 2 sets - 10 reps - Marching with Resistance  - 1 x daily - 3-4 x weekly - 2 sets - 10 reps - Standing Marching  - 1 x daily - 7 x weekly - 2 sets - 10 reps - 2-3s hold - Supine Hip Adductor Stretch  - 2 x daily - 7 x weekly - 3 reps - 30 sec hold - Hip Adductors and Hamstring Stretch with Strap  - 2 x daily - 7 x weekly - 3 reps - 30 sec hold - Bridge with Hip Abduction and Resistance - Ground Touches  - 1 x daily - 3 x weekly - 2 sets - 10 reps - 3 sec hold  Patient Education - Ice Massage - Ionto Pt Instructions - [facility]   ASSESSMENT:  CLINICAL IMPRESSION: Trysten notes benefit from the ionto patch lasting 1-2 days but then pain has been creeping back up, although not quite as bad today.  Overall, she notes 95% improvement in her R elbow pain and 70% improvement in her R hip pain.  R hip pain remains primarily lateral hip extending along ITB and lateral HS.  She reports new stretches are going well and denies need for review today, therefore progressed hip strengthening with continued emphasis on glutes and hip ROM.  Proceeded with 2nd ionto patch application as benefit noted from initial application.   Khloey will benefit from continued skilled PT to address ongoing ROM/flexibility and strength deficits to improve mobility and activity tolerance with decreased pain interference.   EVAL:  ALYLAH BLAKNEY is a 63 y.o. female who was referred to physical therapy for evaluation and treatment for R hip and R elbow pain.  Patient reports onset of R hip pain beginning ~1+ year ago.  Pain is worse with prolonged standing or walking, climbing stairs, putting on socks, rolling over in bed and limits sleeping.  R elbow pain with more recent  onset ~3-4 months ago and attributed to secondary to lateral epicondylitis.  Pain is worse with repetitive motion - folding laundry, working/playing on computer, lifting heavy items.  Patient has deficits in R hip ROM, proximal LE flexibility, R>L B LE, UE and grip strength, abnormal posture, and TTP with abnormal muscle tension which are interfering with ADLs and are impacting quality of life.  On LEFS patient scored 19/80 demonstrating moderate functional limitation.  On QuickDASH patient scored 43.2/100 demonstrating 43.2% disability.  Glady will benefit from skilled PT to address above deficits to improve mobility and activity tolerance with decreased pain interference.  OBJECTIVE IMPAIRMENTS: Abnormal gait, decreased activity tolerance, decreased balance, decreased endurance, decreased knowledge of condition, decreased mobility, difficulty walking, decreased ROM, decreased strength, increased fascial restrictions, impaired perceived functional ability, increased muscle spasms, impaired flexibility, impaired UE functional use, improper body mechanics, postural dysfunction, and pain.   ACTIVITY LIMITATIONS: carrying, lifting, sitting, standing, squatting, sleeping, stairs, transfers, bed mobility, dressing, and locomotion level  PARTICIPATION LIMITATIONS: meal prep, cleaning, laundry, driving, shopping, and community activity  PERSONAL FACTORS: Fitness, Past/current experiences, Time since onset of injury/illness/exacerbation, and 3+ comorbidities: Arthritis, HTN, s/p R CTR 2024, lumbar spondylolisthesis with radiculopathy, s/p L5-S1 MAS PLIF 2016, cervical spondylosis/DDD, cervical radiculopathy, dizziness, LE edema, RTC tendinopathy, obesity, SVT s/p ablation, h/o B knee pain are also affecting patient's functional outcome.   REHAB POTENTIAL: Good  CLINICAL DECISION MAKING: Unstable/unpredictable  EVALUATION COMPLEXITY: High   GOALS: Goals reviewed with patient? Yes  SHORT TERM GOALS: Target  date: 11/01/2023  Patient will be independent with initial HEP. Baseline:  10/03/23 - initial LE HEP provided today 10/05/23 - HEP reviewed and modified to improve patient tolerance 10/20/23 - MET for LE HEP Goal status: MET - 10/24/23  2.  Patient will report at least 25% improvement in R hip and elbow pain to improve QOL. Baseline: R hip - 9/10 on eval, up to 10/10 at worst; R elbow - 3/10 on eval, up to 9/10 at worst 10/03/23 - no R elbow pain today Goal status: MET - 10/24/23 - 90% improvement in he R elbow; 40-50% improvement in her R hip  3.  Patient will improve R hip flexion AROM to >/= 90 and R hip ER AROM to >/= 15 to increase ease of car transfers. Baseline: Refer to above LE ROM table Goal status: MET - 10/24/23 - R hip flexion 90, ER 15  LONG TERM GOALS: Target date: 12/13/2023   Patient will be independent with advanced/ongoing HEP to improve outcomes and carryover.  Baseline:  10/24/23 - Met for current HEP, anticipate further updates 11/07/23 - Met for current HEP Goal status: IN PROGRESS - 11/21/23 - HEP going well including latest updates  2.  Patient will report at least 50-75% improvement in R hip and elbow pain to improve QOL. Baseline: R hip - 9/10 on eval, up to 10/10 at worst; R elbow - 3/10 on eval, up to 9/10 at worst 10/24/23 - 90% improvement in the R elbow; 40-50% improvement in her R hip Goal status: IN PROGRESS - 11/21/23 - 95% improvement in the R elbow; 70% improvement in her R hip  3.  Patient will demonstrate functional pain free R hip ROM to perform ADLs.   Baseline: Refer to above LE ROM table Goal status: IN PROGRESS - 10/24/23 - still limited in most motions  4.  Patient will demonstrate improved B UE and scapular strength to >/= 4 to 4+/5 for functional UE use. Baseline: Refer to above UE MMT table Goal status: IN PROGRESS - 10/24/23 - met except lower traps 3-/5  5.  Patient will demonstrate improved B LE strength to >/= 4 to 4+/5 for improved stability  and ease of mobility. Baseline: Refer to above LE MMT table 10/24/23- See chart above Goal status: IN PROGRESS - 11/07/2023- MMT chart above   6.  Patient will be able to ambulate with normal gait pattern without increased pain and be able to ascend/descend stairs with 1 HR and reciprocal step pattern safely to access home and community.  Baseline:  Goal status: IN PROGRESS - 11/04/23 - ascending pt leans to L side and pulls herself up on RLE; no issues with descending  7. Patient will report >/= 40/80 on LEFS to demonstrate improved functional ability. Baseline: 19 / 80 = 23.8 % Goal status: MET - 11/17/23 - 49 / 80 = 61.3 %  8.  Patient will report </= 29% on QuickDASH to demonstrate improved functional ability. Baseline: 43.2 / 100 = 43.2 % Goal status: MET - 11/17/23 - 2.3 / 100 = 2.3 %  9.  Patient will report <25% sleep disturbance due to pain. Baseline: Both R hip and elbow pain interfere with sleep 10/24/23 - pain interferes with sleep almost every night 20-30% Goal status: IN PROGRESS - 11/15/23 - pain interferes with sleep every night, waking at least once so, rated 60% today  PLAN:  PT FREQUENCY: 2x/week  PT DURATION: 12 weeks  PLANNED INTERVENTIONS: 02835- PT Re-evaluation, 97750- Physical Performance Testing, 97110-Therapeutic exercises, 97530- Therapeutic activity, 97112- Neuromuscular re-education, [phone]- Self Care, 02859- Manual therapy, [phone]- Gait training, [phone]- Aquatic Therapy, [phone]- Electrical stimulation (unattended), [phone]- Ultrasound, [id number]- Ionotophoresis 4mg /ml Dexamethasone , [id number] (1-2 muscles), [id number] (3+ muscles)- Dry Needling, Patient/Family education, Balance training, Stair training, Taping, Joint mobilization, Spinal mobilization, DME instructions, Cryotherapy, and Moist heat  PLAN FOR NEXT SESSION: Assess response to 2nd ionto patch; progress proximal LE stretching and R hip ROM; continue to incorporate LE and core/lumbopelvic strengthening - update HEP as  indicated; MT +/- TPDN and/or modalities/ STM to address abnormal muscle tension and pain - ionto patch #3 of 6 to R greater trochanter if continued benefit noted vs trial of kinesiotaping    Elijah CHRISTELLA Hidden, PT 11/21/2023, 1:55 PM

## 2023-11-24 ENCOUNTER — Encounter

## 2023-11-29 ENCOUNTER — Ambulatory Visit: Attending: Family Medicine

## 2023-11-29 DIAGNOSIS — M6281 Muscle weakness (generalized): Secondary | ICD-10-CM | POA: Insufficient documentation

## 2023-11-29 DIAGNOSIS — M25521 Pain in right elbow: Secondary | ICD-10-CM | POA: Insufficient documentation

## 2023-11-29 DIAGNOSIS — M25551 Pain in right hip: Secondary | ICD-10-CM | POA: Diagnosis not present

## 2023-11-29 DIAGNOSIS — R2689 Other abnormalities of gait and mobility: Secondary | ICD-10-CM | POA: Diagnosis not present

## 2023-11-29 NOTE — Therapy (Signed)
 OUTPATIENT PHYSICAL THERAPY TREATMENT     Patient Name: Sherri Sullivan MRN: [id number] DOB:01-Feb-1961, 63 y.o., female Today's Date: 11/29/2023  END OF SESSION:  PT End of Session - 11/29/23 1316     Visit Number 14    Date for PT Re-Evaluation 12/13/23    Authorization Type [employer]    Authorization Time Period Prior authorization not required    Progress Note Due on Visit 17    PT Start Time 1314    PT Stop Time 1402    PT Time Calculation (min) 48 min    Activity Tolerance Patient tolerated treatment well    Behavior During Therapy WFL for tasks assessed/performed                  Past Medical History:  Diagnosis Date   Anginal pain (HCC)    Arthritis    L shoulder- has had injections, degenerative changes in lumbar spine    Back pain    Chicken pox as a child   Constipation    Dizziness    Dysrhythmia 2013   SVT- treated by ablation by Dr. Waddell to f/u with as needed basis    Encounter for preventative adult health care exam with abnormal findings 02/09/2015   HTN (hypertension)    Hypokalemia 02/09/2015   Joint pain    Leg edema    Measles as a child   Measles as a child   Obesity    Panic attack    during episode of feeling to crowded    Preventative health care 02/09/2015   Seasonal allergies    SOB (shortness of breath) 09/23/2011   a little bit; at rest; before ablation, SOB again now (08/2014- due to lack  of exercise)    SVT (supraventricular tachycardia) (HCC)    Uterine fibroid 12/17/2012   Cervical polyp per patient Follows with [facility], Dr Tanda Mulch   Past Surgical History:  Procedure Laterality Date   ABDOMINAL HYSTERECTOMY  1990's   CARDIAC ELECTROPHYSIOLOGY STUDY AND ABLATION  09/23/11   MAXIMUM ACCESS (MAS)POSTERIOR LUMBAR INTERBODY FUSION (PLIF) 1 LEVEL N/A 09/24/2014   Procedure: L5-S1 MAS PLIF ;  Surgeon: Fairy Levels, MD;  Location: [facility];  Service: Neurosurgery;  Laterality: N/A;  L5-S1 MAS  PLIF fusion   SUPRAVENTRICULAR TACHYCARDIA ABLATION N/A 09/23/2011   Procedure: SUPRAVENTRICULAR TACHYCARDIA ABLATION;  Surgeon: Danelle LELON Waddell, MD;  Location: [facility] CATH LAB;  Service: Cardiovascular;  Laterality: N/A;   TUBAL LIGATION  1980's   Patient Active Problem List   Diagnosis Date Noted   Severe sleep apnea 11/15/2022   Carpal tunnel syndrome 11/01/2022   Numbness of hand 11/01/2022   Cervical spondylosis 11/01/2022   Cervical radiculopathy 11/01/2022   Needs sleep apnea assessment 08/16/2022   H/O colonoscopy with polypectomy 08/16/2022   History of COVID-19 12/06/2021   Dyspnea 11/27/2020   DDD (degenerative disc disease), cervical 11/27/2020   Atypical chest pain 01/18/2019   Acute left-sided thoracic back pain 01/18/2019   Hyperglycemia 01/09/2018   Colon polyp 08/29/2017   Pain in right knee 06/28/2017   Pain in left knee 06/28/2017   Vitamin D  deficiency 02/25/2017   Polyneuropathy 01/21/2016   Hypokalemia 02/09/2015   Encounter for preventative adult health care exam with abnormal findings 02/09/2015   Preventative health care 02/09/2015   Spondylolisthesis of lumbar region 09/24/2014   Spinal stenosis 05/22/2014   Spondylolisthesis at L5-S1 level 05/22/2014   Lumbar radiculopathy 05/22/2014   Neck pain  04/08/2014   Muscle cramps 01/13/2014   Tendinopathy of rotator cuff 11/28/2013   Essential hypertension, benign 11/28/2013   Urinary frequency 08/17/2013   Uterine fibroid 12/17/2012   Obesity    SVT (supraventricular tachycardia) (HCC) 09/21/2011   LOW BACK PAIN 01/04/2008   Hyperlipidemia 02/28/2007   Allergic rhinitis 02/28/2007   KNEE PAIN, LEFT 02/28/2007    PCP: Domenica Harlene LABOR, MD   REFERRING PROVIDER: Joane Artist RAMAN, MD   REFERRING DIAG:  [phone] (ICD-10-CM) - Right elbow pain  M25.551 (ICD-10-CM) - Right hip pain   THERAPY DIAG:  Pain in right hip  Pain in right elbow  Muscle weakness (generalized)  Other abnormalities of gait and  mobility  RATIONALE FOR EVALUATION AND TREATMENT: Rehabilitation  ONSET DATE: R hip ~1 year; R elbow 3-4 months  NEXT MD VISIT: No upcoming appt scheduled at this time (11/07/23)   SUBJECTIVE:   SUBJECTIVE STATEMENT: Pt reports not able to tell the difference from ionto patch, R elbow has not been a problem for her.   EVAL:  Pt reports her R elbow has been getting better since she saw the MD - had been hurting for ~3 months prior to seeing MD.  She has been wearing elbow brace recommended by MD but not sure if she is positioning it properly as it continues to slide down her arm (does not have brace with her on eval).  Pain localized to R lateral epicondyle.  Still has difficulty lifting things with R hand.  R hip has been hurting closer to 1 year and continues to worsen.  Pain originates at lateral hip and radiates into R groin and anterior thigh, but denies numbness or tingling.  She has difficulty lifting her R foot in/out of a car.  PAIN: Are you having pain? Yes: NPRS scale: 5/10   Pain location: R lateral hip into R anterior thigh  Pain description: nagging  Aggravating factors: prolonged standing or walking, climbing stairs, putting on socks, rolling over in bed  Relieving factors: nothing so far (tried heat and ice), maybe rubbing motion when applying Aspercreme   Are you having pain? No - lateral epicondyle of R elbow  PERTINENT HISTORY: Arthritis, HTN, s/p R CTR 2024, lumbar spondylolisthesis with radiculopathy, s/p L5-S1 MAS PLIF 2016, cervical spondylosis/DDD, cervical radiculopathy, dizziness, LE edema, RTC tendinopathy, obesity, SVT s/p ablation, h/o B knee pain  PRECAUTIONS: None  HAND DOMINANCE: Right  RED FLAGS: None  WEIGHT BEARING RESTRICTIONS: No  FALLS:  Has patient fallen in last 6 months? No  LIVING ENVIRONMENT: Lives with: lives with their spouse Lives in: House/apartment Stairs: Yes: Internal: 14 x 2 steps; on right going up and External: 3+4 steps;  none Has following equipment at home: None  OCCUPATION: Retired  PLOF: Independent and Leisure: binge watching Netflix, light gardening   PATIENT GOALS: Better mobility with less pain.   OBJECTIVE: (objective measures completed at initial evaluation unless otherwise dated)  DIAGNOSTIC FINDINGS:  08/30/23 - Diagnostic Limited MSK Ultrasound of: Right lateral elbow Intact common extensor tendon origin at lateral epicondyle.  Small avulsion fleck present at superficial portion of lateral epicondyle. Increased vascular activity on Doppler consistent with neovascularity. No visible tears present in the tendon. Impression: Lateral epicondylitis  08/30/23 - DG hip IMPRESSION: Moderate to severe right hip degenerative change.  PATIENT SURVEYS:  Quick Dash: 43.2 / 100 = 43.2 % LEFS: 19 / 80 = 23.8 %, moderate functional limitation  COGNITION: Overall cognitive status: Within functional limits for tasks  assessed     SENSATION: WFL  EDEMA:  N/A  POSTURE:  rounded shoulders, forward head, flexed trunk , and weight shift left  PALPATION: Increased muscle tension with TTP over R glutes and piriformis.  Mildly TTP over R greater trochanter.  TTP over R wrist extensor group and lateral epicondyle.  UPPER EXTREMITY ROM:  Grossly [facility]  UPPER EXTREMITY MMT:  MMT Right eval Left eval R 10/24/23 L 10/24/23  Shoulder flexion 4- 4 4+ 4+  Shoulder extension 4 4 4+ 4+  Shoulder abduction 4- 4+ 4+ 4+  Shoulder adduction      Shoulder internal rotation 4+ 4+ 5 5  Shoulder external rotation 4- 4 4+ 4+  Middle trapezius 3+ 4- 4 4  Lower trapezius 3- 3- 3- 3-  Elbow flexion 4- 4+ 4+ 5  Elbow extension 4- 4- 4+ 4+  Wrist flexion      Wrist extension      Wrist ulnar deviation      Wrist radial deviation      Wrist pronation      Wrist supination      Grip strength (lbs) 13.67# 26.33# 24.33# 29.33#  (Blank rows = not tested)  MUSCLE LENGTH: Hamstrings: mildly tight L>R ITB: mod tight  B Piriformis: severe tight R>L Hip flexors: mod tight B Quads: mild tight B Heelcord:   LOWER EXTREMITY ROM:  Active ROM Right eval Left eval R 10/24/23 L 10/24/23 R 11/04/23  Hip flexion 74 91 82 94 90  Hip extension       Hip abduction       Hip adduction       Hip internal rotation 39 44 38 44   Hip external rotation 7 27 20  32 15  Knee flexion       Knee extension       Ankle dorsiflexion       Ankle plantarflexion       Ankle inversion       Ankle eversion        (Blank rows = not tested)  LOWER EXTREMITY MMT:  MMT Right eval Left eval R 10/24/23 L 10/24/23 R 11/07/23 L 11/07/23  Hip flexion 3+ p! 4- 4- p! 4+ 4- p!  4+  Hip extension 3- 3- 3+ 3+ 3+ 3+  Hip abduction 3- 3+ 3+ 4 4 4   Hip adduction 2+ 3- 3+ limited ROM 4- 3+; limited ROM 4-  Hip internal rotation 4 4+ 4+ 4+ 4+ 4+  Hip external rotation 2 4- 4- limited ROM 4 limited ROM 4+ 4+  Knee flexion 4- 4 4 4 4  4+  Knee extension 4- 4 4+ 4+ 4 4+  Ankle dorsiflexion 4- 4 4+ 4+ 4+ 4+  Ankle plantarflexion        Ankle inversion        Ankle eversion         (Blank rows = not tested)  LOWER EXTREMITY SPECIAL TESTS:  Hip special tests: Belvie (FABER) test: positive , Thomas test: positive , and Ober's test: negative  FUNCTIONAL TESTS:  5 times sit to stand: 27.40 sec  TRANSFERS: Assistive device utilized: None  Sit to stand: Complete Independence and Modified independence -typically relies on UE assist but able to complete sit to stand without UE assist although tendency for weight shift to L with R genu valgum Stand to sit: Complete Independence and Modified independence Chair to chair: Modified independence Floor: NT   TODAY'S TREATMENT: 11/29/2023 THERAPEUTIC EXERCISE: To improve strength,  endurance, ROM, and flexibility.  Demonstration, verbal and tactile cues throughout for technique.  NuStep - L5 x 6 min (UE/LE) Heel raises from step 6' x 20 Modified R pigeon pose piriformis/hip ER stretch foot on 9' stool  added 2 pads to elevate surface 3 x 30  NEUROMUSCULAR RE-EDUCATION Step ups RLE 6' x 10  Lateral step up RLE 6' x 10  Single leg RDL R/L x 10 from airex pad Toe tap to 9' stool from airex pad 2x10 Standing hip abduction BLE x 10 from airex pad Standing hip extension BLE from airex pad x 10 Clock taps; fwd, lat, posterior 5x each LE from airex pad   MANUAL THERAPY: To promote normalized muscle tension, improve joint mobility and/or for pain modulation  R LLD for pain relief and for hip joint distraction Traditional hip mobs grade III inferior glide 3 bouts 15x  11/21/2023 THERAPEUTIC EXERCISE: To improve strength, endurance, ROM, and flexibility.  Demonstration, verbal and tactile cues throughout for technique.  NuStep - L5 x 6 min (UE/LE) Modified R pigeon pose piriformis/hip ER stretch (side-sitting on elevated table with foot supported on seat of chair) 3 x 30  NEUROMUSCULAR RE-EDUCATION: To improve balance, coordination, kinesthesia, and proprioception. Standing Fitter extension (1 black/1 blue) 2 x 10, UE support on back of chair for balance  Standing Fitter abduction (1 black/1 blue) 2 x 10, UE support on back of chair for balance Standing hip flexion/ABD/ER arcs with 2# ankle weights 2 x 10 on L, 2 x 8 on R; UE support on back of chair for balance  MODALITIES:  Iontophoresis with 1.0 mL 4mg /ml Dexamethasone  - 4-6 hour patch (27mA-min) to R greater trochanter (patch #2 of 6)    11/17/2023  THERAPEUTIC EXERCISE: To improve strength, endurance, ROM, and flexibility.  Demonstration, verbal and tactile cues throughout for technique.  Rec Bike - L3 x 6 min  Hooklying B hip IR/ER rotation x 10 Hooklying B butterfly hip adductor stretch 3 x 30 Supine R hip adductor stretch with strap 3 x 30 Seated R hip adductor stretch x 30 - better stretch with supine stretches  THERAPEUTIC ACTIVITIES: To improve functional performance.  Demonstration, verbal and tactile cues throughout for  technique. QuickDASH: 2.3 / 100 = 2.3 % LEFS: 49 / 80 = 61.3 %  NEUROMUSCULAR RE-EDUCATION: To improve coordination and kinesthesia. Bridge + GTB hip ABD/ER clam (only moving as far as she can keep hips level) x 10  MODALITIES:  Iontophoresis with 1.0 mL 4mg /ml Dexamethasone  - 4-6 hour patch (8mA-min) to R greater trochanter (patch #1 of 6)   11/15/23 THERAPEUTIC EXERCISE: To improve strength, ROM, and flexibility.   NuStep L5x103min  Standing hip ext x15 B w/ RTB at ankles  Standing hip abd 2x10 RTB  Standing Marches 1# B, 2x30 Alt hip flexion marches w/ 2-3s holds 2x10  THERAPEUTIC ACTIVITIES: To improve functional performance.  Demonstration, verbal and tactile cues throughout for technique. Functional Squats w/ mirror w/ 10# kb on 9' stool 2x10  Stationary lunge x10  Stationary lunge w/ contra 5# kb x10 B- audible popping from L knee  Fwd Step Ups + contra load 5# kb  Lateral Step ups contra load 5# kb 2x10 B (alt lead foot)    11/10/23 THERAPEUTIC EXERCISE: To improve strength, ROM, and flexibility.   Rec Bike- L2x59min  Seated piriformis stretch w/ foot on 9 stool, 2x30 RLE  Standing hip ext 2x10 B RTB at ankles  Standing hip abd 2x10 B RTB at  ankles  Seated Marches w/ RTB under forefoot 2x10- pt expressed more difficulty on R side  THERAPEUTIC ACTIVITIES: To improve functional performance.  Demonstration, verbal and tactile cues throughout for technique. Functional Squats w/ mirror w/ 5# kb on 9' stool 2x10  Fwd Step Ups on 4 2 x10- RLE leading; 2nd set caused pain in R hip  Fwd step up + high knee march 4' x5 B- expressed difficulty  Lateral Step Ups and Overs onto 4 step- 2x10 B    11/07/2023  THERAPEUTIC EXERCISE: To improve strength, ROM, and flexibility.   NuStep L5x6 min  Seated Piriformis Stretch w/ foot on 9 stool- 2x30 RLE  LE MMT (LTG 5)  Side Stepping w/ RTB at distal thighs 2x20' Standing Hip Ext x15 B  Standing Hip Add x15 B THERAPEUTIC  ACTIVITIES: To improve functional performance.  Demonstration, verbal and tactile cues throughout for technique. Functional Squats w/ 5# kb on 9 stool- lifting mechanics provided before  Fwd Step ups onto 4 2x1'- pt reports R hip feels wobbly on the step today    11/04/2023  THERAPEUTIC EXERCISE: To improve strength, ROM, and flexibility.  Demonstration, verbal and tactile cues throughout for technique. NuStep L5 x 6 min  Seated piriformis stretch on 9' stool 2x30 Seated gluteal lunge stretch on 9' stool 2x30  THERAPEUTIC ACTIVITIES: To improve functional performance.  Demonstration, verbal and tactile cues throughout for technique. Stairs assessed; ascending pt leans to L side and pulls herself up on RLE; no issues with descending Standing hip abduction x 20 BLE; YTB at ankles x 10 Standing hip extension 10 BLE; YTB at ankles x 10  Side stepping YTB at ankles 3x along counter Standing marching YTB at forefoot x 10 BLE Fwd stepping over pool noodle x 15 BLE- R foot started to drag Lateral stepping over pool noodle x 15 BLE   PATIENT EDUCATION:  Education details: Ionto patch wearing instructions  Person educated: Patient Education method: Explanation Education comprehension: verbalized understanding  HOME EXERCISE PROGRAM: *Pt using MedBridgeGO app  Access Code: [email or web address] URL: [email or web address] Date: 11/17/2023 Prepared by: Elijah Hidden  Exercises - Seated Wrist Extension Stretch  - 2 x daily - 7 x weekly - 3 reps - 30 sec hold - Seated Wrist Flexion Stretch  - 2 x daily - 7 x weekly - 3 reps - 30 sec hold - Seated Hip Flexor Stretch  - 2-3 x daily - 7 x weekly - 3 reps - 30 sec hold - Putty Squeezes  - 1 x daily - 7 x weekly - 2 sets - 10 reps - 3 sec hold - Seated Hamstring Stretch  - 2-3 x daily - 7 x weekly - 3 reps - 30 sec hold - Seated Piriformis Stretch  - 2-3 x daily - 7 x weekly - 3 reps - 30 sec hold - Standing Gluteal Stretch on Chair  - 2-3 x  daily - 7 x weekly - 3 reps - 30 sec hold - Standing ITB Stretch (Mirrored)  - 2-3 x daily - 7 x weekly - 3 reps - 30 sec hold - Seated Hip Adduction Squeeze with Ball  - 1 x daily - 7 x weekly - 2 sets - 10 reps - 3-5 sec hold - Seated Hip Abduction with Resistance  - 1 x daily - 7 x weekly - 2 sets - 10 reps - 3 sec hold - Seated Eccentric Wrist Flexion with Dumbbell  - 1 x daily - 3 x weekly -  2 sets - 10 reps - 3 sec hold - Seated Eccentric Wrist Extension  - 1 x daily - 3 x weekly - 2 sets - 10 reps - 3 sec hold - Seated Wrist Supination Pronation with Can  - 1 x daily - 3 x weekly - 2 sets - 10 reps - 3 sec hold - Wrist Flexion and Extension with Resistance Bar  - 1 x daily - 3 x weekly - 2 sets - 10 reps - 3 sec hold - Supine Hip Internal and External Rotation  - 1 x daily - 7 x weekly - 2 sets - 10 reps - 3 sec hold - Clamshell  - 1 x daily - 3 x weekly - 2 sets - 10 reps - 3 sec hold - Standing Hip Abduction with Resistance at Ankles and Counter Support  - 1 x daily - 3-4 x weekly - 2 sets - 10 reps - Standing Hip Extension with Resistance at Ankles and Counter Support  - 1 x daily - 3-4 x weekly - 2 sets - 10 reps - Side Stepping with Resistance at Ankles and Counter Support  - 1 x daily - 3-4 x weekly - 2 sets - 10 reps - Marching with Resistance  - 1 x daily - 3-4 x weekly - 2 sets - 10 reps - Standing Marching  - 1 x daily - 7 x weekly - 2 sets - 10 reps - 2-3s hold - Supine Hip Adductor Stretch  - 2 x daily - 7 x weekly - 3 reps - 30 sec hold - Hip Adductors and Hamstring Stretch with Strap  - 2 x daily - 7 x weekly - 3 reps - 30 sec hold - Bridge with Hip Abduction and Resistance - Ground Touches  - 1 x daily - 3 x weekly - 2 sets - 10 reps - 3 sec hold  Patient Education - Ice Massage - Ionto Pt Instructions - [facility]   ASSESSMENT:  CLINICAL IMPRESSION: Pt showed a good response to treatment. During mobilizations pain subsided, however afterwards no change in pain.  Emphasized standing single leg balance and functional strength of RLE.  Sherri Sullivan will benefit from continued skilled PT to address ongoing ROM/flexibility and strength deficits to improve mobility and activity tolerance with decreased pain interference.   EVAL:  Sherri Sullivan is a 63 y.o. female who was referred to physical therapy for evaluation and treatment for R hip and R elbow pain.  Patient reports onset of R hip pain beginning ~1+ year ago.  Pain is worse with prolonged standing or walking, climbing stairs, putting on socks, rolling over in bed and limits sleeping.  R elbow pain with more recent onset ~3-4 months ago and attributed to secondary to lateral epicondylitis.  Pain is worse with repetitive motion - folding laundry, working/playing on computer, lifting heavy items.  Patient has deficits in R hip ROM, proximal LE flexibility, R>L B LE, UE and grip strength, abnormal posture, and TTP with abnormal muscle tension which are interfering with ADLs and are impacting quality of life.  On LEFS patient scored 19/80 demonstrating moderate functional limitation.  On QuickDASH patient scored 43.2/100 demonstrating 43.2% disability.  Sherri Sullivan will benefit from skilled PT to address above deficits to improve mobility and activity tolerance with decreased pain interference.  OBJECTIVE IMPAIRMENTS: Abnormal gait, decreased activity tolerance, decreased balance, decreased endurance, decreased knowledge of condition, decreased mobility, difficulty walking, decreased ROM, decreased strength, increased fascial restrictions, impaired perceived functional ability, increased  muscle spasms, impaired flexibility, impaired UE functional use, improper body mechanics, postural dysfunction, and pain.   ACTIVITY LIMITATIONS: carrying, lifting, sitting, standing, squatting, sleeping, stairs, transfers, bed mobility, dressing, and locomotion level  PARTICIPATION LIMITATIONS: meal prep, cleaning, laundry, driving, shopping, and  community activity  PERSONAL FACTORS: Fitness, Past/current experiences, Time since onset of injury/illness/exacerbation, and 3+ comorbidities: Arthritis, HTN, s/p R CTR 2024, lumbar spondylolisthesis with radiculopathy, s/p L5-S1 MAS PLIF 2016, cervical spondylosis/DDD, cervical radiculopathy, dizziness, LE edema, RTC tendinopathy, obesity, SVT s/p ablation, h/o B knee pain are also affecting patient's functional outcome.   REHAB POTENTIAL: Good  CLINICAL DECISION MAKING: Unstable/unpredictable  EVALUATION COMPLEXITY: High   GOALS: Goals reviewed with patient? Yes  SHORT TERM GOALS: Target date: 11/01/2023  Patient will be independent with initial HEP. Baseline:  10/03/23 - initial LE HEP provided today 10/05/23 - HEP reviewed and modified to improve patient tolerance 10/20/23 - MET for LE HEP Goal status: MET - 10/24/23  2.  Patient will report at least 25% improvement in R hip and elbow pain to improve QOL. Baseline: R hip - 9/10 on eval, up to 10/10 at worst; R elbow - 3/10 on eval, up to 9/10 at worst 10/03/23 - no R elbow pain today Goal status: MET - 10/24/23 - 90% improvement in he R elbow; 40-50% improvement in her R hip  3.  Patient will improve R hip flexion AROM to >/= 90 and R hip ER AROM to >/= 15 to increase ease of car transfers. Baseline: Refer to above LE ROM table Goal status: MET - 10/24/23 - R hip flexion 90, ER 15  LONG TERM GOALS: Target date: 12/13/2023   Patient will be independent with advanced/ongoing HEP to improve outcomes and carryover.  Baseline:  10/24/23 - Met for current HEP, anticipate further updates 11/07/23 - Met for current HEP Goal status: IN PROGRESS - 11/21/23 - HEP going well including latest updates  2.  Patient will report at least 50-75% improvement in R hip and elbow pain to improve QOL. Baseline: R hip - 9/10 on eval, up to 10/10 at worst; R elbow - 3/10 on eval, up to 9/10 at worst 10/24/23 - 90% improvement in the R elbow; 40-50%  improvement in her R hip Goal status: IN PROGRESS - 11/21/23 - 95% improvement in the R elbow; 70% improvement in her R hip  3.  Patient will demonstrate functional pain free R hip ROM to perform ADLs.   Baseline: Refer to above LE ROM table Goal status: IN PROGRESS - 10/24/23 - still limited in most motions  4.  Patient will demonstrate improved B UE and scapular strength to >/= 4 to 4+/5 for functional UE use. Baseline: Refer to above UE MMT table Goal status: IN PROGRESS - 10/24/23 - met except lower traps 3-/5  5.  Patient will demonstrate improved B LE strength to >/= 4 to 4+/5 for improved stability and ease of mobility. Baseline: Refer to above LE MMT table 10/24/23- See chart above Goal status: IN PROGRESS - 11/07/2023- MMT chart above   6.  Patient will be able to ambulate with normal gait pattern without increased pain and be able to ascend/descend stairs with 1 HR and reciprocal step pattern safely to access home and community.  Baseline:  Goal status: IN PROGRESS - 11/04/23 - ascending pt leans to L side and pulls herself up on RLE; no issues with descending  7. Patient will report >/= 40/80 on LEFS to demonstrate improved functional ability.  Baseline: 19 / 80 = 23.8 % Goal status: MET - 11/17/23 - 49 / 80 = 61.3 %  8.  Patient will report </= 29% on QuickDASH to demonstrate improved functional ability. Baseline: 43.2 / 100 = 43.2 % Goal status: MET - 11/17/23 - 2.3 / 100 = 2.3 %  9.  Patient will report <25% sleep disturbance due to pain. Baseline: Both R hip and elbow pain interfere with sleep 10/24/23 - pain interferes with sleep almost every night 20-30% Goal status: IN PROGRESS - 11/15/23 - pain interferes with sleep every night, waking at least once so, rated 60% today    PLAN:  PT FREQUENCY: 2x/week  PT DURATION: 12 weeks  PLANNED INTERVENTIONS: 97164- PT Re-evaluation, 97750- Physical Performance Testing, 97110-Therapeutic exercises, 97530- Therapeutic activity,  97112- Neuromuscular re-education, 97535- Self Care, 02859- Manual therapy, [id number]- Gait training, [phone]- Aquatic Therapy, [phone]- Electrical stimulation (unattended), [id number]- Ultrasound, 02966- Ionotophoresis 4mg /ml Dexamethasone , 79439 (1-2 muscles), 20561 (3+ muscles)- Dry Needling, Patient/Family education, Balance training, Stair training, Taping, Joint mobilization, Spinal mobilization, DME instructions, Cryotherapy, and Moist heat  PLAN FOR NEXT SESSION: per PT; try KT tape to greater trochanter area; progress proximal LE stretching and R hip ROM; continue to incorporate LE and core/lumbopelvic strengthening - update HEP as indicated; MT +/- TPDN and/or modalities/ STM to address abnormal muscle tension and pain   Chukwuemeka Artola L Serayah Yazdani, PTA 11/29/2023, 2:12 PM

## 2023-12-01 ENCOUNTER — Ambulatory Visit

## 2023-12-01 DIAGNOSIS — M25551 Pain in right hip: Secondary | ICD-10-CM

## 2023-12-01 DIAGNOSIS — M6281 Muscle weakness (generalized): Secondary | ICD-10-CM

## 2023-12-01 DIAGNOSIS — M25521 Pain in right elbow: Secondary | ICD-10-CM

## 2023-12-01 DIAGNOSIS — R2689 Other abnormalities of gait and mobility: Secondary | ICD-10-CM

## 2023-12-01 NOTE — Therapy (Addendum)
 OUTPATIENT PHYSICAL THERAPY TREATMENT / DISCHARGE SUMMARY     Patient Name: Sherri Sullivan MRN: [id number] DOB:12-Feb-1961, 63 y.o., female Today's Date: 12/01/2023  END OF SESSION:  PT End of Session - 12/01/23 1353     Visit Number 15    Date for PT Re-Evaluation 12/13/23    Authorization Type [employer]    Authorization Time Period Prior authorization not required    Progress Note Due on Visit 17    PT Start Time 1308    PT Stop Time 1350    PT Time Calculation (min) 42 min    Activity Tolerance Patient tolerated treatment well    Behavior During Therapy WFL for tasks assessed/performed                   Past Medical History:  Diagnosis Date   Anginal pain (HCC)    Arthritis    L shoulder- has had injections, degenerative changes in lumbar spine    Back pain    Chicken pox as a child   Constipation    Dizziness    Dysrhythmia 2013   SVT- treated by ablation by Dr. Waddell to f/u with as needed basis    Encounter for preventative adult health care exam with abnormal findings 02/09/2015   HTN (hypertension)    Hypokalemia 02/09/2015   Joint pain    Leg edema    Measles as a child   Measles as a child   Obesity    Panic attack    during episode of feeling to crowded    Preventative health care 02/09/2015   Seasonal allergies    SOB (shortness of breath) 09/23/2011   a little bit; at rest; before ablation, SOB again now (08/2014- due to lack  of exercise)    SVT (supraventricular tachycardia) (HCC)    Uterine fibroid 12/17/2012   Cervical polyp per patient Follows with [facility], Dr Tanda Mulch   Past Surgical History:  Procedure Laterality Date   ABDOMINAL HYSTERECTOMY  1990's   CARDIAC ELECTROPHYSIOLOGY STUDY AND ABLATION  09/23/11   MAXIMUM ACCESS (MAS)POSTERIOR LUMBAR INTERBODY FUSION (PLIF) 1 LEVEL N/A 09/24/2014   Procedure: L5-S1 MAS PLIF ;  Surgeon: Fairy Levels, MD;  Location: [facility];  Service: Neurosurgery;   Laterality: N/A;  L5-S1 MAS PLIF fusion   SUPRAVENTRICULAR TACHYCARDIA ABLATION N/A 09/23/2011   Procedure: SUPRAVENTRICULAR TACHYCARDIA ABLATION;  Surgeon: Danelle LELON Waddell, MD;  Location: [facility] CATH LAB;  Service: Cardiovascular;  Laterality: N/A;   TUBAL LIGATION  1980's   Patient Active Problem List   Diagnosis Date Noted   Severe sleep apnea 11/15/2022   Carpal tunnel syndrome 11/01/2022   Numbness of hand 11/01/2022   Cervical spondylosis 11/01/2022   Cervical radiculopathy 11/01/2022   Needs sleep apnea assessment 08/16/2022   H/O colonoscopy with polypectomy 08/16/2022   History of COVID-19 12/06/2021   Dyspnea 11/27/2020   DDD (degenerative disc disease), cervical 11/27/2020   Atypical chest pain 01/18/2019   Acute left-sided thoracic back pain 01/18/2019   Hyperglycemia 01/09/2018   Colon polyp 08/29/2017   Pain in right knee 06/28/2017   Pain in left knee 06/28/2017   Vitamin D  deficiency 02/25/2017   Polyneuropathy 01/21/2016   Hypokalemia 02/09/2015   Encounter for preventative adult health care exam with abnormal findings 02/09/2015   Preventative health care 02/09/2015   Spondylolisthesis of lumbar region 09/24/2014   Spinal stenosis 05/22/2014   Spondylolisthesis at L5-S1 level 05/22/2014   Lumbar radiculopathy 05/22/2014  Neck pain 04/08/2014   Muscle cramps 01/13/2014   Tendinopathy of rotator cuff 11/28/2013   Essential hypertension, benign 11/28/2013   Urinary frequency 08/17/2013   Uterine fibroid 12/17/2012   Obesity    SVT (supraventricular tachycardia) (HCC) 09/21/2011   LOW BACK PAIN 01/04/2008   Hyperlipidemia 02/28/2007   Allergic rhinitis 02/28/2007   KNEE PAIN, LEFT 02/28/2007    PCP: Domenica Harlene LABOR, MD   REFERRING PROVIDER: Joane Artist RAMAN, MD   REFERRING DIAG:  [phone] (ICD-10-CM) - Right elbow pain  M25.551 (ICD-10-CM) - Right hip pain   THERAPY DIAG:  Pain in right hip  Pain in right elbow  Muscle weakness (generalized)  Other  abnormalities of gait and mobility  RATIONALE FOR EVALUATION AND TREATMENT: Rehabilitation  ONSET DATE: R hip ~1 year; R elbow 3-4 months  NEXT MD VISIT: No upcoming appt scheduled at this time (11/07/23)   SUBJECTIVE:   SUBJECTIVE STATEMENT: Same pain in R hip today.  EVAL:  Pt reports her R elbow has been getting better since she saw the MD - had been hurting for ~3 months prior to seeing MD.  She has been wearing elbow brace recommended by MD but not sure if she is positioning it properly as it continues to slide down her arm (does not have brace with her on eval).  Pain localized to R lateral epicondyle.  Still has difficulty lifting things with R hand.  R hip has been hurting closer to 1 year and continues to worsen.  Pain originates at lateral hip and radiates into R groin and anterior thigh, but denies numbness or tingling.  She has difficulty lifting her R foot in/out of a car.  PAIN: Are you having pain? Yes: NPRS scale: 5/10   Pain location: R lateral hip into R anterior thigh  Pain description: nagging  Aggravating factors: prolonged standing or walking, climbing stairs, putting on socks, rolling over in bed  Relieving factors: nothing so far (tried heat and ice), maybe rubbing motion when applying Aspercreme   Are you having pain? No - lateral epicondyle of R elbow  PERTINENT HISTORY: Arthritis, HTN, s/p R CTR 2024, lumbar spondylolisthesis with radiculopathy, s/p L5-S1 MAS PLIF 2016, cervical spondylosis/DDD, cervical radiculopathy, dizziness, LE edema, RTC tendinopathy, obesity, SVT s/p ablation, h/o B knee pain  PRECAUTIONS: None  HAND DOMINANCE: Right  RED FLAGS: None  WEIGHT BEARING RESTRICTIONS: No  FALLS:  Has patient fallen in last 6 months? No  LIVING ENVIRONMENT: Lives with: lives with their spouse Lives in: House/apartment Stairs: Yes: Internal: 14 x 2 steps; on right going up and External: 3+4 steps; none Has following equipment at home:  None  OCCUPATION: Retired  PLOF: Independent and Leisure: binge watching Netflix, light gardening   PATIENT GOALS: Better mobility with less pain.   OBJECTIVE: (objective measures completed at initial evaluation unless otherwise dated)  DIAGNOSTIC FINDINGS:  08/30/23 - Diagnostic Limited MSK Ultrasound of: Right lateral elbow Intact common extensor tendon origin at lateral epicondyle.  Small avulsion fleck present at superficial portion of lateral epicondyle. Increased vascular activity on Doppler consistent with neovascularity. No visible tears present in the tendon. Impression: Lateral epicondylitis  08/30/23 - DG hip IMPRESSION: Moderate to severe right hip degenerative change.  PATIENT SURVEYS:  Quick Dash: 43.2 / 100 = 43.2 % LEFS: 19 / 80 = 23.8 %, moderate functional limitation  COGNITION: Overall cognitive status: Within functional limits for tasks assessed     SENSATION: WFL  EDEMA:  N/A  POSTURE:  rounded shoulders, forward head, flexed trunk , and weight shift left  PALPATION: Increased muscle tension with TTP over R glutes and piriformis.  Mildly TTP over R greater trochanter.  TTP over R wrist extensor group and lateral epicondyle.  UPPER EXTREMITY ROM:  Grossly [facility]  UPPER EXTREMITY MMT:  MMT Right eval Left eval R 10/24/23 L 10/24/23  Shoulder flexion 4- 4 4+ 4+  Shoulder extension 4 4 4+ 4+  Shoulder abduction 4- 4+ 4+ 4+  Shoulder adduction      Shoulder internal rotation 4+ 4+ 5 5  Shoulder external rotation 4- 4 4+ 4+  Middle trapezius 3+ 4- 4 4  Lower trapezius 3- 3- 3- 3-  Elbow flexion 4- 4+ 4+ 5  Elbow extension 4- 4- 4+ 4+  Wrist flexion      Wrist extension      Wrist ulnar deviation      Wrist radial deviation      Wrist pronation      Wrist supination      Grip strength (lbs) 13.67# 26.33# 24.33# 29.33#  (Blank rows = not tested)  MUSCLE LENGTH: Hamstrings: mildly tight L>R ITB: mod tight B Piriformis: severe tight R>L Hip  flexors: mod tight B Quads: mild tight B Heelcord:   LOWER EXTREMITY ROM:  Active ROM Right eval Left eval R 10/24/23 L 10/24/23 R 11/04/23 R 12/01/23  Hip flexion 74 91 82 94 90 97- mild pain; 100 after manual  Hip extension        Hip abduction        Hip adduction        Hip internal rotation 39 44 38 44  41- mild pain  Hip external rotation 7 27 20  32 15 20- mild pain  Knee flexion        Knee extension        Ankle dorsiflexion        Ankle plantarflexion        Ankle inversion        Ankle eversion         (Blank rows = not tested)  LOWER EXTREMITY MMT:  MMT Right eval Left eval R 10/24/23 L 10/24/23 R 11/07/23 L 11/07/23  Hip flexion 3+ p! 4- 4- p! 4+ 4- p!  4+  Hip extension 3- 3- 3+ 3+ 3+ 3+  Hip abduction 3- 3+ 3+ 4 4 4   Hip adduction 2+ 3- 3+ limited ROM 4- 3+; limited ROM 4-  Hip internal rotation 4 4+ 4+ 4+ 4+ 4+  Hip external rotation 2 4- 4- limited ROM 4 limited ROM 4+ 4+  Knee flexion 4- 4 4 4 4  4+  Knee extension 4- 4 4+ 4+ 4 4+  Ankle dorsiflexion 4- 4 4+ 4+ 4+ 4+  Ankle plantarflexion        Ankle inversion        Ankle eversion         (Blank rows = not tested)  LOWER EXTREMITY SPECIAL TESTS:  Hip special tests: Belvie (FABER) test: positive , Thomas test: positive , and Ober's test: negative  FUNCTIONAL TESTS:  5 times sit to stand: 27.40 sec  TRANSFERS: Assistive device utilized: None  Sit to stand: Complete Independence and Modified independence -typically relies on UE assist but able to complete sit to stand without UE assist although tendency for weight shift to L with R genu valgum Stand to sit: Complete Independence and Modified independence Chair to chair: Modified independence Floor: NT  TODAY'S TREATMENT: 12/01/2023 THERAPEUTIC EXERCISE: To improve strength, endurance, ROM, and flexibility.  Demonstration, verbal and tactile cues throughout for technique.  NuStep - L5 x 6 min (UE/LE) Hip hinge with RLE on 9' stool x 10   NEUROMUSCULAR  RE-EDUCATION Single leg RDL R/L 2 x 10 from airex pad Toe tap to 9' stool from airex pad 2x10 Standing hip abduction BLE x 10 from airex pad Standing hip flexion BLE x 10 airex pad Clock taps; fwd, lat, posterior 10x each LE from airex pad Leg press 20lb 2x10 BLE  MANUAL THERAPY: To promote normalized muscle tension, improve joint mobility and/or for pain modulation  Traditional hip mobs grade III inferior glide 3 bouts 15x  11/29/2023 THERAPEUTIC EXERCISE: To improve strength, endurance, ROM, and flexibility.  Demonstration, verbal and tactile cues throughout for technique.  NuStep - L5 x 6 min (UE/LE) Heel raises from step 6' x 20 Modified R pigeon pose piriformis/hip ER stretch foot on 9' stool added 2 pads to elevate surface 3 x 30  NEUROMUSCULAR RE-EDUCATION Step ups RLE 6' x 10  Lateral step up RLE 6' x 10  Single leg RDL R/L x 10 from airex pad Toe tap to 9' stool from airex pad 2x10 Standing hip abduction BLE x 10 from airex pad Standing hip extension BLE from airex pad x 10 Clock taps; fwd, lat, posterior 5x each LE from airex pad   MANUAL THERAPY: To promote normalized muscle tension, improve joint mobility and/or for pain modulation  R LLD for pain relief and for hip joint distraction Traditional hip mobs grade III inferior glide 3 bouts 15x  11/21/2023 THERAPEUTIC EXERCISE: To improve strength, endurance, ROM, and flexibility.  Demonstration, verbal and tactile cues throughout for technique.  NuStep - L5 x 6 min (UE/LE) Modified R pigeon pose piriformis/hip ER stretch (side-sitting on elevated table with foot supported on seat of chair) 3 x 30  NEUROMUSCULAR RE-EDUCATION: To improve balance, coordination, kinesthesia, and proprioception. Standing Fitter extension (1 black/1 blue) 2 x 10, UE support on back of chair for balance  Standing Fitter abduction (1 black/1 blue) 2 x 10, UE support on back of chair for balance Standing hip flexion/ABD/ER arcs with 2# ankle  weights 2 x 10 on L, 2 x 8 on R; UE support on back of chair for balance  MODALITIES:  Iontophoresis with 1.0 mL 4mg /ml Dexamethasone  - 4-6 hour patch (49mA-min) to R greater trochanter (patch #2 of 6)    11/17/2023  THERAPEUTIC EXERCISE: To improve strength, endurance, ROM, and flexibility.  Demonstration, verbal and tactile cues throughout for technique.  Rec Bike - L3 x 6 min  Hooklying B hip IR/ER rotation x 10 Hooklying B butterfly hip adductor stretch 3 x 30 Supine R hip adductor stretch with strap 3 x 30 Seated R hip adductor stretch x 30 - better stretch with supine stretches  THERAPEUTIC ACTIVITIES: To improve functional performance.  Demonstration, verbal and tactile cues throughout for technique. QuickDASH: 2.3 / 100 = 2.3 % LEFS: 49 / 80 = 61.3 %  NEUROMUSCULAR RE-EDUCATION: To improve coordination and kinesthesia. Bridge + GTB hip ABD/ER clam (only moving as far as she can keep hips level) x 10  MODALITIES:  Iontophoresis with 1.0 mL 4mg /ml Dexamethasone  - 4-6 hour patch (40mA-min) to R greater trochanter (patch #1 of 6)   11/15/23 THERAPEUTIC EXERCISE: To improve strength, ROM, and flexibility.   NuStep L5x35min  Standing hip ext x15 B w/ RTB at ankles  Standing hip abd 2x10  RTB  Standing Marches 1# B, 2x30 Alt hip flexion marches w/ 2-3s holds 2x10  THERAPEUTIC ACTIVITIES: To improve functional performance.  Demonstration, verbal and tactile cues throughout for technique. Functional Squats w/ mirror w/ 10# kb on 9' stool 2x10  Stationary lunge x10  Stationary lunge w/ contra 5# kb x10 B- audible popping from L knee  Fwd Step Ups + contra load 5# kb  Lateral Step ups contra load 5# kb 2x10 B (alt lead foot)    11/10/23 THERAPEUTIC EXERCISE: To improve strength, ROM, and flexibility.   Rec Bike- L2x62min  Seated piriformis stretch w/ foot on 9 stool, 2x30 RLE  Standing hip ext 2x10 B RTB at ankles  Standing hip abd 2x10 B RTB at ankles  Seated Marches w/  RTB under forefoot 2x10- pt expressed more difficulty on R side  THERAPEUTIC ACTIVITIES: To improve functional performance.  Demonstration, verbal and tactile cues throughout for technique. Functional Squats w/ mirror w/ 5# kb on 9' stool 2x10  Fwd Step Ups on 4 2 x10- RLE leading; 2nd set caused pain in R hip  Fwd step up + high knee march 4' x5 B- expressed difficulty  Lateral Step Ups and Overs onto 4 step- 2x10 B    11/07/2023  THERAPEUTIC EXERCISE: To improve strength, ROM, and flexibility.   NuStep L5x6 min  Seated Piriformis Stretch w/ foot on 9 stool- 2x30 RLE  LE MMT (LTG 5)  Side Stepping w/ RTB at distal thighs 2x20' Standing Hip Ext x15 B  Standing Hip Add x15 B THERAPEUTIC ACTIVITIES: To improve functional performance.  Demonstration, verbal and tactile cues throughout for technique. Functional Squats w/ 5# kb on 9 stool- lifting mechanics provided before  Fwd Step ups onto 4 2x1'- pt reports R hip feels wobbly on the step today    11/04/2023  THERAPEUTIC EXERCISE: To improve strength, ROM, and flexibility.  Demonstration, verbal and tactile cues throughout for technique. NuStep L5 x 6 min  Seated piriformis stretch on 9' stool 2x30 Seated gluteal lunge stretch on 9' stool 2x30  THERAPEUTIC ACTIVITIES: To improve functional performance.  Demonstration, verbal and tactile cues throughout for technique. Stairs assessed; ascending pt leans to L side and pulls herself up on RLE; no issues with descending Standing hip abduction x 20 BLE; YTB at ankles x 10 Standing hip extension 10 BLE; YTB at ankles x 10  Side stepping YTB at ankles 3x along counter Standing marching YTB at forefoot x 10 BLE Fwd stepping over pool noodle x 15 BLE- R foot started to drag Lateral stepping over pool noodle x 15 BLE   PATIENT EDUCATION:  Education details: Ionto patch wearing instructions  Person educated: Patient Education method: Explanation Education comprehension: verbalized  understanding  HOME EXERCISE PROGRAM: *Pt using MedBridge[employer] app  Access Code: [email or web address] URL: [email or web address] Date: 11/17/2023 Prepared by: Elijah Hidden  Exercises - Seated Wrist Extension Stretch  - 2 x daily - 7 x weekly - 3 reps - 30 sec hold - Seated Wrist Flexion Stretch  - 2 x daily - 7 x weekly - 3 reps - 30 sec hold - Seated Hip Flexor Stretch  - 2-3 x daily - 7 x weekly - 3 reps - 30 sec hold - Putty Squeezes  - 1 x daily - 7 x weekly - 2 sets - 10 reps - 3 sec hold - Seated Hamstring Stretch  - 2-3 x daily - 7 x weekly - 3 reps - 30 sec hold - Seated Piriformis Stretch  -  2-3 x daily - 7 x weekly - 3 reps - 30 sec hold - Standing Gluteal Stretch on Chair  - 2-3 x daily - 7 x weekly - 3 reps - 30 sec hold - Standing ITB Stretch (Mirrored)  - 2-3 x daily - 7 x weekly - 3 reps - 30 sec hold - Seated Hip Adduction Squeeze with Ball  - 1 x daily - 7 x weekly - 2 sets - 10 reps - 3-5 sec hold - Seated Hip Abduction with Resistance  - 1 x daily - 7 x weekly - 2 sets - 10 reps - 3 sec hold - Seated Eccentric Wrist Flexion with Dumbbell  - 1 x daily - 3 x weekly - 2 sets - 10 reps - 3 sec hold - Seated Eccentric Wrist Extension  - 1 x daily - 3 x weekly - 2 sets - 10 reps - 3 sec hold - Seated Wrist Supination Pronation with Can  - 1 x daily - 3 x weekly - 2 sets - 10 reps - 3 sec hold - Wrist Flexion and Extension with Resistance Bar  - 1 x daily - 3 x weekly - 2 sets - 10 reps - 3 sec hold - Supine Hip Internal and External Rotation  - 1 x daily - 7 x weekly - 2 sets - 10 reps - 3 sec hold - Clamshell  - 1 x daily - 3 x weekly - 2 sets - 10 reps - 3 sec hold - Standing Hip Abduction with Resistance at Ankles and Counter Support  - 1 x daily - 3-4 x weekly - 2 sets - 10 reps - Standing Hip Extension with Resistance at Ankles and Counter Support  - 1 x daily - 3-4 x weekly - 2 sets - 10 reps - Side Stepping with Resistance at Ankles and Counter Support  - 1 x daily -  3-4 x weekly - 2 sets - 10 reps - Marching with Resistance  - 1 x daily - 3-4 x weekly - 2 sets - 10 reps - Standing Marching  - 1 x daily - 7 x weekly - 2 sets - 10 reps - 2-3s hold - Supine Hip Adductor Stretch  - 2 x daily - 7 x weekly - 3 reps - 30 sec hold - Hip Adductors and Hamstring Stretch with Strap  - 2 x daily - 7 x weekly - 3 reps - 30 sec hold - Bridge with Hip Abduction and Resistance - Ground Touches  - 1 x daily - 3 x weekly - 2 sets - 10 reps - 3 sec hold  Patient Education - Ice Massage - Ionto Pt Instructions - [facility]   ASSESSMENT:  CLINICAL IMPRESSION: Good response to treatment. Hip AROM has improved with flexion, especially after MT. She responds very well to the hip mobilizations. Ralynn will benefit from continued skilled PT to address ongoing ROM/flexibility and strength deficits to improve mobility and activity tolerance with decreased pain interference.   EVAL:  BRAIDEN PRESUTTI is a 63 y.o. female who was referred to physical therapy for evaluation and treatment for R hip and R elbow pain.  Patient reports onset of R hip pain beginning ~1+ year ago.  Pain is worse with prolonged standing or walking, climbing stairs, putting on socks, rolling over in bed and limits sleeping.  R elbow pain with more recent onset ~3-4 months ago and attributed to secondary to lateral epicondylitis.  Pain is worse with repetitive motion -  folding laundry, working/playing on computer, lifting heavy items.  Patient has deficits in R hip ROM, proximal LE flexibility, R>L B LE, UE and grip strength, abnormal posture, and TTP with abnormal muscle tension which are interfering with ADLs and are impacting quality of life.  On LEFS patient scored 19/80 demonstrating moderate functional limitation.  On QuickDASH patient scored 43.2/100 demonstrating 43.2% disability.  Adelai will benefit from skilled PT to address above deficits to improve mobility and activity tolerance with decreased pain  interference.  OBJECTIVE IMPAIRMENTS: Abnormal gait, decreased activity tolerance, decreased balance, decreased endurance, decreased knowledge of condition, decreased mobility, difficulty walking, decreased ROM, decreased strength, increased fascial restrictions, impaired perceived functional ability, increased muscle spasms, impaired flexibility, impaired UE functional use, improper body mechanics, postural dysfunction, and pain.   ACTIVITY LIMITATIONS: carrying, lifting, sitting, standing, squatting, sleeping, stairs, transfers, bed mobility, dressing, and locomotion level  PARTICIPATION LIMITATIONS: meal prep, cleaning, laundry, driving, shopping, and community activity  PERSONAL FACTORS: Fitness, Past/current experiences, Time since onset of injury/illness/exacerbation, and 3+ comorbidities: Arthritis, HTN, s/p R CTR 2024, lumbar spondylolisthesis with radiculopathy, s/p L5-S1 MAS PLIF 2016, cervical spondylosis/DDD, cervical radiculopathy, dizziness, LE edema, RTC tendinopathy, obesity, SVT s/p ablation, h/o B knee pain are also affecting patient's functional outcome.   REHAB POTENTIAL: Good  CLINICAL DECISION MAKING: Unstable/unpredictable  EVALUATION COMPLEXITY: High   GOALS: Goals reviewed with patient? Yes  SHORT TERM GOALS: Target date: 11/01/2023  Patient will be independent with initial HEP. Baseline:  10/03/23 - initial LE HEP provided today 10/05/23 - HEP reviewed and modified to improve patient tolerance 10/20/23 - MET for LE HEP Goal status: MET - 10/24/23  2.  Patient will report at least 25% improvement in R hip and elbow pain to improve QOL. Baseline: R hip - 9/10 on eval, up to 10/10 at worst; R elbow - 3/10 on eval, up to 9/10 at worst 10/03/23 - no R elbow pain today Goal status: MET - 10/24/23 - 90% improvement in he R elbow; 40-50% improvement in her R hip  3.  Patient will improve R hip flexion AROM to >/= 90 and R hip ER AROM to >/= 15 to increase ease of car  transfers. Baseline: Refer to above LE ROM table Goal status: MET - 10/24/23 - R hip flexion 90, ER 15  LONG TERM GOALS: Target date: 12/13/2023   Patient will be independent with advanced/ongoing HEP to improve outcomes and carryover.  Baseline:  10/24/23 - Met for current HEP, anticipate further updates 11/07/23 - Met for current HEP Goal status: IN PROGRESS - 11/21/23 - HEP going well including latest updates  2.  Patient will report at least 50-75% improvement in R hip and elbow pain to improve QOL. Baseline: R hip - 9/10 on eval, up to 10/10 at worst; R elbow - 3/10 on eval, up to 9/10 at worst 10/24/23 - 90% improvement in the R elbow; 40-50% improvement in her R hip Goal status: IN PROGRESS - 11/21/23 - 95% improvement in the R elbow; 70% improvement in her R hip  3.  Patient will demonstrate functional pain free R hip ROM to perform ADLs.   Baseline: Refer to above LE ROM table Goal status: IN PROGRESS - 10/24/23 - still limited in most motions  4.  Patient will demonstrate improved B UE and scapular strength to >/= 4 to 4+/5 for functional UE use. Baseline: Refer to above UE MMT table Goal status: IN PROGRESS - 10/24/23 - met except lower traps 3-/5  5.  Patient will demonstrate improved B LE strength to >/= 4 to 4+/5 for improved stability and ease of mobility. Baseline: Refer to above LE MMT table 10/24/23- See chart above Goal status: IN PROGRESS - 11/07/2023- MMT chart above   6.  Patient will be able to ambulate with normal gait pattern without increased pain and be able to ascend/descend stairs with 1 HR and reciprocal step pattern safely to access home and community.  Baseline:  Goal status: IN PROGRESS - 11/04/23 - ascending pt leans to L side and pulls herself up on RLE; no issues with descending  7. Patient will report >/= 40/80 on LEFS to demonstrate improved functional ability. Baseline: 19 / 80 = 23.8 % Goal status: MET - 11/17/23 - 49 / 80 = 61.3 %  8.  Patient will  report </= 29% on QuickDASH to demonstrate improved functional ability. Baseline: 43.2 / 100 = 43.2 % Goal status: MET - 11/17/23 - 2.3 / 100 = 2.3 %  9.  Patient will report <25% sleep disturbance due to pain. Baseline: Both R hip and elbow pain interfere with sleep 10/24/23 - pain interferes with sleep almost every night 20-30% Goal status: IN PROGRESS - 11/15/23 - pain interferes with sleep every night, waking at least once so, rated 60% today    PLAN:  PT FREQUENCY: 2x/week  PT DURATION: 12 weeks  PLANNED INTERVENTIONS: 97164- PT Re-evaluation, 97750- Physical Performance Testing, 97110-Therapeutic exercises, 97530- Therapeutic activity, 97112- Neuromuscular re-education, 97535- Self Care, 02859- Manual therapy, [phone]- Gait training, [phone]- Aquatic Therapy, [phone]- Electrical stimulation (unattended), 97035- Ultrasound, 02966- Ionotophoresis 4mg /ml Dexamethasone , 79439 (1-2 muscles), 20561 (3+ muscles)- Dry Needling, Patient/Family education, Balance training, Stair training, Taping, Joint mobilization, Spinal mobilization, DME instructions, Cryotherapy, and Moist heat  PLAN FOR NEXT SESSION: per PT; try KT tape to greater trochanter area; progress proximal LE stretching and R hip ROM; continue to incorporate LE and core/lumbopelvic strengthening - update HEP as indicated; MT +/- TPDN and/or modalities/ STM to address abnormal muscle tension and pain   Sharrell Krawiec L Jakylan Ron, PTA 12/01/2023, 2:03 PM   PHYSICAL THERAPY DISCHARGE SUMMARY  Visits from Start of Care: 15  Current functional level related to goals / functional outcomes: Refer to above clinical impression and goal assessment for status as of last visit on 12/01/2023. Patient cancelled her last 3 scheduled visits and did not return to PT prior tot he end of her certification period, therefore will proceed with discharge from PT for this episode.    Remaining deficits: As above. Unable to formally assess status at discharge due to  failure to return to PT.    Education / Equipment: HEP   Patient agrees to discharge. Patient goals were partially met. Patient is being discharged due to not returning since the last visit.  Elijah EMERSON Hidden, PT 03/06/2024, 10:48 AM  [facility] [address]  [address] [place], [place], [address] Phone: [phone]   Fax:  [phone]

## 2023-12-04 DIAGNOSIS — G4733 Obstructive sleep apnea (adult) (pediatric): Secondary | ICD-10-CM | POA: Diagnosis not present

## 2023-12-05 ENCOUNTER — Encounter: Admitting: Physical Therapy

## 2023-12-08 ENCOUNTER — Encounter: Admitting: Physical Therapy

## 2023-12-08 DIAGNOSIS — G4733 Obstructive sleep apnea (adult) (pediatric): Secondary | ICD-10-CM | POA: Diagnosis not present

## 2023-12-11 ENCOUNTER — Other Ambulatory Visit: Payer: Self-pay | Admitting: Cardiology

## 2023-12-12 ENCOUNTER — Ambulatory Visit

## 2023-12-16 ENCOUNTER — Other Ambulatory Visit: Payer: Self-pay | Admitting: Family Medicine

## 2023-12-23 DIAGNOSIS — Z79899 Other long term (current) drug therapy: Secondary | ICD-10-CM | POA: Insufficient documentation

## 2023-12-23 DIAGNOSIS — Z0000 Encounter for general adult medical examination without abnormal findings: Secondary | ICD-10-CM | POA: Insufficient documentation

## 2023-12-23 NOTE — Assessment & Plan Note (Signed)
 Patient encouraged to maintain heart healthy diet, regular exercise, adequate sleep. Consider daily probiotics. Take medications as prescribed

## 2023-12-23 NOTE — Assessment & Plan Note (Signed)
 UDS and contract updated today

## 2023-12-23 NOTE — Progress Notes (Signed)
 Subjective:     Patient ID: Sherri Sullivan, female    DOB: Feb 20, 1961, 63 y.o.   MRN: [id number]  Chief Complaint  Patient presents with   4 month follow up    HPI  Discussed the use of AI scribe software for clinical note transcription with the patient, who gave verbal consent to proceed.  History of Present Illness Sherri Sullivan is a 63 year old female with chronic low back pain who presents for follow up chronic conditions.   She experiences persistent lower back pain and spasms following a lumbar fusion surgery performed approximately ten years ago. The pain is steady, with episodes of throbbing and burning sensations, primarily on the right side, sometimes radiating to the hip. Pain intensity ranges from 7 to 8 daily and worsens with activity, particularly when using stairs. Physical therapy in July and August provided some strength improvement but did not significantly alleviate the pain. She uses diclofenac  daily for pain management.  She takes potassium supplements twice daily for low potassium and Protonix  40 mg daily for heartburn. Her blood pressure is stable, monitored regularly at home. She uses Flonase  at bedtime and Zyrtec in the morning for allergies.  Sleep disturbances due to back pain are present, and she has been prescribed alprazolam  to assist with CPAP machine use for severe sleep apnea. She reports difficulty adjusting to the CPAP, feeling 'suffocated' by the nasal mask, and has inconsistent usage patterns.  She has asthma and uses albuterol  and budesonide inhalers, two puffs each, twice daily. Despite this, she experienced a severe asthma attack recently and reports persistent congestion. She has a BMI over 40 and a history of severe sleep apnea.  Follows with pulmonology, sports medicine, GYN,  Sports medicine- Dr. Velma  Anxiety Alprazolam  0.25 mg take 1/2 to 1 tablet by mouth at bedtime as needed for anxiety  HLD-Lipitor 10 mg daily  HTN-metoprolol  25 mg  daily, triamterene -hydrochlorothiazide (MAXZIDE) 37.5-25 mg daily BP- Home Bps in range Potassium 20 mg by mouth twice daily  Asthma-, OSA with pulmonology, Dr. Sood Symbicort daily, albuterol  as needed  Vitamin D  deficiency-1000 units vitamin D  daily  Chronic pain Diclofenac  (Voltaren ) 75 mg EC tablet  GERD-Protonix  40 mg daily   Low estrogen, postmenopausal Estradiol  2 mg tablet daily  Taking medications as prescribed, denies adverse Ses.  Patient denies fever, chills, SOB, CP, palpitations, dyspnea, edema, HA, vision changes, N/V/D, abdominal pain, urinary symptoms, rash, weight changes, and recent illness or hospitalizations.    History of Present Illness              Health Maintenance Due  Topic Date Due   Pneumococcal Vaccine: 50+ Years (2 of 2 - PPSV23, PCV20, or PCV21) 06/07/2013   Influenza Vaccine  10/28/2023    Past Medical History:  Diagnosis Date   Anginal pain    Arthritis    L shoulder- has had injections, degenerative changes in lumbar spine    Back pain    Chicken pox as a child   Constipation    Dizziness    Dysrhythmia 2013   SVT- treated by ablation by Dr. Waddell to f/u with as needed basis    Encounter for preventative adult health care exam with abnormal findings 02/09/2015   HTN (hypertension)    Hypokalemia 02/09/2015   Joint pain    Leg edema    Measles as a child   Measles as a child   Obesity    Panic attack  during episode of feeling to crowded    Preventative health care 02/09/2015   Seasonal allergies    SOB (shortness of breath) 09/23/2011   a little bit; at rest; before ablation, SOB again now (08/2014- due to lack  of exercise)    SVT (supraventricular tachycardia)    Uterine fibroid 12/17/2012   Cervical polyp per patient Follows with [facility], Dr Tanda Mulch    Past Surgical History:  Procedure Laterality Date   ABDOMINAL HYSTERECTOMY  1990's   CARDIAC ELECTROPHYSIOLOGY STUDY AND ABLATION  09/23/11    MAXIMUM ACCESS (MAS)POSTERIOR LUMBAR INTERBODY FUSION (PLIF) 1 LEVEL N/A 09/24/2014   Procedure: L5-S1 MAS PLIF ;  Surgeon: Fairy Levels, MD;  Location: [facility];  Service: Neurosurgery;  Laterality: N/A;  L5-S1 MAS PLIF fusion   SUPRAVENTRICULAR TACHYCARDIA ABLATION N/A 09/23/2011   Procedure: SUPRAVENTRICULAR TACHYCARDIA ABLATION;  Surgeon: Danelle LELON Birmingham, MD;  Location: [facility] CATH LAB;  Service: Cardiovascular;  Laterality: N/A;   TUBAL LIGATION  1980's    Family History  Problem Relation Age of Onset   Diabetes Mother        type 2   Heart disease Mother    Hypertension Mother    Stroke Mother    Obesity Mother    Heart disease Father    Obesity Father    Cancer Sister        lung   Hypertension Daughter    Lupus Daughter    Diabetes Maternal Aunt    Kidney disease Maternal Aunt    Diabetes Maternal Uncle    Stroke Maternal Grandmother    Breast cancer Cousin    Colon cancer Neg Hx    Esophageal cancer Neg Hx    Rectal cancer Neg Hx    Stomach cancer Neg Hx     Social History   Socioeconomic History   Marital status: Married    Spouse name: Debby   Number of children: 2   Years of education: Not on file   Highest education level: 12th grade  Occupational History   Occupation: Retired  Tobacco Use   Smoking status: Former    Current packs/day: 0.00    Average packs/day: 0.1 packs/day for 8.0 years (1.0 ttl pk-yrs)    Types: Cigarettes    Start date: 03/29/1976    Quit date: 03/29/1984    Years since quitting: 39.7   Smokeless tobacco: Never  Vaping Use   Vaping status: Never Used  Substance and Sexual Activity   Alcohol use: Yes    Alcohol/week: 14.0 standard drinks of alcohol    Types: 14 Glasses of wine per week    Comment: Drinks 2-3 glasses wine on the weekends   Drug use: No   Sexual activity: Not Currently  Other Topics Concern   Not on file  Social History Narrative   No major restrictions   Lives with husband, daughter, grand daughter in a 2  story home.    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   Social Drivers of Corporate investment banker Strain: Low Risk  (12/26/2023)   Overall Financial Resource Strain (CARDIA)    Difficulty of Paying Living Expenses: Not hard at all  Food Insecurity: No Food Insecurity (12/26/2023)   Hunger Vital Sign    Worried About Running Out of Food in the Last Year: Never true    Ran Out of Food in the Last Year: Never true  Transportation Needs: No Transportation Needs (12/26/2023)   PRAPARE - Administrator, Civil Service (Medical): No    Lack of Transportation (Non-Medical): No  Physical Activity: Inactive (12/26/2023)   Exercise Vital Sign    Days of Exercise per Week: 0 days    Minutes of Exercise per Session: Not on file  Stress: No Stress Concern Present (12/26/2023)   [employer] of Occupational Health - Occupational Stress Questionnaire    Feeling of Stress: Not at all  Social Connections: Moderately Isolated (12/26/2023)   Social Connection and Isolation Panel    Frequency of Communication with Friends and Family: More than three times a week    Frequency of Social Gatherings with Friends and Family: More than three times a week    Attends Religious Services: Patient declined    Database administrator or Organizations: No    Attends Engineer, structural: Not on file    Marital Status: Married  Catering manager Violence: Not At Risk (03/28/2023)   Humiliation, Afraid, Rape, and Kick questionnaire    Fear of Current or Ex-Partner: No    Emotionally Abused: No    Physically Abused: No    Sexually Abused: No    Outpatient Medications Prior to Visit  Medication Sig Dispense Refill   albuterol  (VENTOLIN  HFA) 108 (90 Base) MCG/ACT inhaler INHALE 2 PUFFS BY MOUTH EVERY 6 HOURS AS NEEDED FOR WHEEZING FOR SHORTNESS OF BREATH 18 g 0   ALPRAZolam  (XANAX ) 0.25 MG tablet Take 1/2 to 1 (one-half to one) tablet by mouth at bedtime as  needed for anxiety 30 tablet 0   ascorbic acid (VITAMIN C) 100 MG tablet      atorvastatin  (LIPITOR) 10 MG tablet Take 1 tablet by mouth once daily 90 tablet 0   budesonide-formoterol (SYMBICORT) 160-4.5 MCG/ACT inhaler Inhale 2 puffs into the lungs 2 (two) times daily.     Cholecalciferol (D 1000) 25 MCG (1000 UT) capsule      diclofenac  (VOLTAREN ) 75 MG EC tablet Take 1 tablet by mouth twice daily 180 tablet 1   estradiol  (ESTRACE ) 2 MG tablet Take 1 tablet by mouth once daily 90 tablet 0   fluticasone  (FLONASE ) 50 MCG/ACT nasal spray Place 2 sprays into both nostrils daily. 48 g 1   methocarbamol  (ROBAXIN ) 500 MG tablet Take 1 tablet (500 mg total) by mouth every 6 (six) hours as needed for muscle spasms. 90 tablet 0   metoprolol  succinate (TOPROL -XL) 25 MG 24 hr tablet Take 1 tablet (25 mg total) by mouth daily. Take with or immediately following a meal 90 tablet 0   Multiple Vitamins-Minerals (ZINC PO)      OMEGA-3 KRILL OIL PO Take 353 mg by mouth daily. MegaRed Joint Care     pantoprazole  (PROTONIX ) 40 MG tablet Take 1 tablet by mouth once daily 30 tablet 0   potassium chloride  SA (KLOR-CON  M20) 20 MEQ tablet Take 1 tablet (20 mEq total) by mouth 2 (two) times daily. 180 tablet 1   Probiotic Product (PROBIOTIC PO) Take 1 tablet by mouth in the morning.     triamterene -hydrochlorothiazide (MAXZIDE-25) 37.5-25 MG tablet Take 1 tablet by mouth once daily 90 tablet 1   Wheat Dextrin (BENEFIBER) POWD Take 2-3 scoop by mouth daily as needed.     tirzepatide  (ZEPBOUND ) 2.5 MG/0.5ML injection vial Inject 0.5 mLs into the skin once a week. (Patient not taking: Reported on 12/27/2023)     No facility-administered medications prior to visit.  Allergies  Allergen Reactions   Doxycycline  Diarrhea and Nausea And Vomiting    ROS    See HPI Objective:    Physical Exam General: No acute distress. Awake and conversant. +obese Eyes: Normal conjunctiva, anicteric. Round symmetric pupils.   Respiratory: CTAB. Respirations are non-labored. No wheezing.  Skin: Warm. No rashes or ulcers.  Psych: Alert and oriented. Cooperative, Appropriate mood and affect, Normal judgment.  CV: RRR. No murmur. +1 b/l LE edema  MSK: Normal ambulation. No clubbing or cyanosis.  Neuro:  CN II-XII grossly normal.    BP 126/68 (BP Location: Right Arm, Patient Position: Sitting, Cuff Size: Large)   Pulse 73   Temp 98.2 F (36.8 C) (Oral)   Resp 12   Ht 5' 2 (1.575 m)   Wt 222 lb 3.2 oz (100.8 kg)   SpO2 97%   BMI 40.64 kg/m  Wt Readings from Last 3 Encounters:  12/27/23 222 lb 3.2 oz (100.8 kg)  10/25/23 224 lb (101.6 kg)  08/30/23 225 lb (102.1 kg)       Assessment & Plan:   Problem List Items Addressed This Visit     DDD (degenerative disc disease), cervical   Relevant Medications   semaglutide-weight management (WEGOVY) 0.25 MG/0.5ML SOAJ SQ injection   Essential hypertension, benign   Well controlled, no changes to meds. Encouraged heart healthy diet such as the DASH diet and exercise as tolerated.        Relevant Medications   semaglutide-weight management (WEGOVY) 0.25 MG/0.5ML SOAJ SQ injection   High risk medication use - Primary   UDS and contract updated today.      Relevant Orders   Drug Monitoring Panel [id number] , Urine   Hyperlipidemia   Tolerating statin.  Encourage heart healthy diet such as MIND or DASH diet, increase exercise, avoid trans fats, simple carbohydrates and processed foods, consider a krill or fish or flaxseed oil cap daily.         Relevant Medications   semaglutide-weight management (WEGOVY) 0.25 MG/0.5ML SOAJ SQ injection   Other Relevant Orders   Comp Met (CMET)   LOW BACK PAIN   Relevant Medications   semaglutide-weight management (WEGOVY) 0.25 MG/0.5ML SOAJ SQ injection   Other Relevant Orders   Ambulatory referral to Orthopedic Surgery   Moderate persistent asthma   Asthma Follows with Pulmonology, Dr. Shellia. Managed with budesonide  and albuterol  inhalers. Recent severe breakthrough episode reported. - Continue budesonide and albuterol  inhalers as prescribed. - Continue Flonase  and Zyrtec for allergy management.      Obesity   Rx- Wegovy inj weekly Encouraged DASH or MIND diet, decrease po intake and increase exercise as tolerated. Needs 7-8 hours of sleep nightly. Avoid trans fats, eat small, frequent meals every 4-5 hours with lean proteins, complex carbs and healthy fats. Minimize simple carbs will.        Relevant Medications   semaglutide-weight management (WEGOVY) 0.25 MG/0.5ML SOAJ SQ injection   Severe sleep apnea   Severe obstructive sleep apnea with CPAP intolerance r/t anxiety. Discussed weight loss benefits on sleep apnea. - Continue CPAP therapy with alprazolam  as needed for CPAP tolerance. - Attempt to obtain [facility] for weight loss, which may improve sleep apnea.      Vitamin D  deficiency   Supplement and monitor      Relevant Orders   Vitamin D  (25 hydroxy)   Other Visit Diagnoses       Postmenopausal estrogen deficiency         OSA (  obstructive sleep apnea)       Relevant Medications   semaglutide-weight management (WEGOVY) 0.25 MG/0.5ML SOAJ SQ injection      HCM Pap-follows with GYN, records requested [facility]- Last 06/2022-GYN follows [facility]- Last 2024, WNL; Repeat 2 years Immunizations-influenza, PNA due- given today in office  Assessment and Plan Chronic low back pain, has been seen by sports medicine and recently finished PT with little relief. Hx  lumbar fusion Persistent pain post-lumbar fusion, rated 7-8/10 with activity, affecting sleep. Physical therapy improved strength but not pain. Discussed risks of long-term diclofenac  use. Considered lumbar injections for pain relief.  - Refer to Dr. Donnice Car for orthopedic evaluation   Obesity, BMI >= 40 Obesity with BMI 40.64. Previous weight loss medication attempts denied by insurance. Discussed weight loss benefits on back pain,  HTN, and sleep apnea. Plan to seek approval for [facility]. - Submit prior authorization for [employer]. - Discuss weight loss goals of 1-2 pounds per week, aiming for 4-8 pounds per month. - Advise on balanced diet and lifestyle modifications.   General Health Maintenance Received pneumonia and flu vaccinations. Mammogram completed. Gynecology follow-up scheduled for November. - Ensure follow-up with gynecology in November.   I have discontinued Mayra Jolliffe. Bracknell's tirzepatide . I am also having her start on Wegovy. Additionally, I am having her maintain her OMEGA-3 KRILL OIL PO, Probiotic Product (PROBIOTIC PO), Benefiber, estradiol , methocarbamol , D 1000, Multiple Vitamins-Minerals (ZINC PO), ascorbic acid, fluticasone , albuterol , triamterene -hydrochlorothiazide, potassium chloride  SA, budesonide-formoterol, atorvastatin , pantoprazole , metoprolol  succinate, ALPRAZolam , and diclofenac .  Meds ordered this encounter  Medications   semaglutide-weight management (WEGOVY) 0.25 MG/0.5ML SOAJ SQ injection    Sig: Inject 0.25 mg into the skin once a week. After 4 weeks, can increase to 0.5 mg weekly for 4 weeks, then 1 mg weekly for 4 weeks    Dispense:  2 mL    Refill:  3    Supervising Provider:   DOMENICA BLACKBIRD A [[id number]]

## 2023-12-23 NOTE — Assessment & Plan Note (Signed)
 Supplement and monitor

## 2023-12-23 NOTE — Assessment & Plan Note (Signed)
 Tolerating statin. Encourage heart healthy diet such as MIND or DASH diet, increase exercise, avoid trans fats, simple carbohydrates and processed foods, consider a krill or fish or flaxseed oil cap daily.

## 2023-12-23 NOTE — Assessment & Plan Note (Signed)
 Well controlled, no changes to meds. Encouraged heart healthy diet such as the DASH diet and exercise as tolerated.

## 2023-12-27 ENCOUNTER — Ambulatory Visit: Admitting: Student

## 2023-12-27 ENCOUNTER — Encounter: Payer: Self-pay | Admitting: Student

## 2023-12-27 VITALS — BP 126/68 | HR 73 | Temp 98.2°F | Resp 12 | Ht 62.0 in | Wt 222.2 lb

## 2023-12-27 DIAGNOSIS — M545 Low back pain, unspecified: Secondary | ICD-10-CM

## 2023-12-27 DIAGNOSIS — M5030 Other cervical disc degeneration, unspecified cervical region: Secondary | ICD-10-CM

## 2023-12-27 DIAGNOSIS — G4733 Obstructive sleep apnea (adult) (pediatric): Secondary | ICD-10-CM

## 2023-12-27 DIAGNOSIS — J4540 Moderate persistent asthma, uncomplicated: Secondary | ICD-10-CM

## 2023-12-27 DIAGNOSIS — I10 Essential (primary) hypertension: Secondary | ICD-10-CM

## 2023-12-27 DIAGNOSIS — Z0000 Encounter for general adult medical examination without abnormal findings: Secondary | ICD-10-CM

## 2023-12-27 DIAGNOSIS — G4730 Sleep apnea, unspecified: Secondary | ICD-10-CM

## 2023-12-27 DIAGNOSIS — E559 Vitamin D deficiency, unspecified: Secondary | ICD-10-CM | POA: Diagnosis not present

## 2023-12-27 DIAGNOSIS — Z79899 Other long term (current) drug therapy: Secondary | ICD-10-CM

## 2023-12-27 DIAGNOSIS — G8929 Other chronic pain: Secondary | ICD-10-CM

## 2023-12-27 DIAGNOSIS — Z780 Asymptomatic menopausal state: Secondary | ICD-10-CM

## 2023-12-27 DIAGNOSIS — Z6841 Body Mass Index (BMI) 40.0 and over, adult: Secondary | ICD-10-CM

## 2023-12-27 DIAGNOSIS — E66813 Obesity, class 3: Secondary | ICD-10-CM | POA: Diagnosis not present

## 2023-12-27 DIAGNOSIS — E785 Hyperlipidemia, unspecified: Secondary | ICD-10-CM

## 2023-12-27 DIAGNOSIS — Z23 Encounter for immunization: Secondary | ICD-10-CM | POA: Diagnosis not present

## 2023-12-27 MED ORDER — WEGOVY 0.25 MG/0.5ML ~~LOC~~ SOAJ: 0.2500 mg | SUBCUTANEOUS | 3 refills | Status: DC

## 2023-12-27 NOTE — Assessment & Plan Note (Addendum)
 Severe obstructive sleep apnea with CPAP intolerance r/t anxiety. Discussed weight loss benefits on sleep apnea. - Continue CPAP therapy with alprazolam  as needed for CPAP tolerance. - Attempt to obtain [facility] for weight loss, which may improve sleep apnea.

## 2023-12-27 NOTE — Addendum Note (Signed)
 Addended by: ESTELLE GILLIS D on: 12/27/2023 03:25 PM   Modules accepted: Orders

## 2023-12-27 NOTE — Assessment & Plan Note (Signed)
 Asthma Follows with Pulmonology, Dr. Shellia. Managed with budesonide and albuterol  inhalers. Recent severe breakthrough episode reported. - Continue budesonide and albuterol  inhalers as prescribed. - Continue Flonase  and Zyrtec for allergy management.

## 2023-12-27 NOTE — Assessment & Plan Note (Signed)
 Rx- Wegovy inj weekly Encouraged DASH or MIND diet, decrease po intake and increase exercise as tolerated. Needs 7-8 hours of sleep nightly. Avoid trans fats, eat small, frequent meals every 4-5 hours with lean proteins, complex carbs and healthy fats. Minimize simple carbs will.

## 2023-12-28 LAB — COMPREHENSIVE METABOLIC PANEL WITH GFR
ALT: 15 U/L (ref 0–35)
AST: 17 U/L (ref 0–37)
Albumin: 4 g/dL (ref 3.5–5.2)
Alkaline Phosphatase: 56 U/L (ref 39–117)
BUN: 12 mg/dL (ref 6–23)
CO2: 28 meq/L (ref 19–32)
Calcium: 10.5 mg/dL (ref 8.4–10.5)
Chloride: 106 meq/L (ref 96–112)
Creatinine, Ser: 0.74 mg/dL (ref 0.40–1.20)
GFR: 86.05 mL/min (ref >60.00)
Glucose, Bld: 91 mg/dL (ref 70–99)
Potassium: 4.7 meq/L (ref 3.5–5.1)
Sodium: 141 meq/L (ref 135–145)
Total Bilirubin: 1 mg/dL (ref 0.2–1.2)
Total Protein: 7 g/dL (ref 6.0–8.3)

## 2023-12-28 LAB — VITAMIN D 25 HYDROXY (VIT D DEFICIENCY, FRACTURES): VITD: 36.31 ng/mL (ref 30.00–100.00)

## 2023-12-29 ENCOUNTER — Other Ambulatory Visit: Payer: Self-pay | Admitting: Student

## 2023-12-29 ENCOUNTER — Ambulatory Visit: Payer: Self-pay | Admitting: Student

## 2023-12-29 DIAGNOSIS — I10 Essential (primary) hypertension: Secondary | ICD-10-CM

## 2023-12-29 DIAGNOSIS — E785 Hyperlipidemia, unspecified: Secondary | ICD-10-CM

## 2023-12-29 DIAGNOSIS — G4733 Obstructive sleep apnea (adult) (pediatric): Secondary | ICD-10-CM

## 2023-12-29 DIAGNOSIS — M545 Low back pain, unspecified: Secondary | ICD-10-CM

## 2023-12-29 DIAGNOSIS — Z6841 Body Mass Index (BMI) 40.0 and over, adult: Secondary | ICD-10-CM

## 2023-12-29 DIAGNOSIS — M5030 Other cervical disc degeneration, unspecified cervical region: Secondary | ICD-10-CM

## 2023-12-29 LAB — DRUG MONITORING PANEL 376104, URINE
Amphetamines: NEGATIVE ng/mL (ref <500)
Barbiturates: NEGATIVE ng/mL (ref <300)
Benzodiazepines: NEGATIVE ng/mL (ref <100)
Cocaine Metabolite: NEGATIVE ng/mL (ref <150)
Desmethyltramadol: NEGATIVE ng/mL (ref <100)
Opiates: NEGATIVE ng/mL (ref <100)
Oxycodone: NEGATIVE ng/mL (ref <100)
Tramadol: NEGATIVE ng/mL (ref <100)

## 2023-12-29 LAB — DM TEMPLATE

## 2023-12-30 ENCOUNTER — Other Ambulatory Visit (HOSPITAL_COMMUNITY): Payer: Self-pay

## 2024-01-02 ENCOUNTER — Other Ambulatory Visit (HOSPITAL_COMMUNITY): Payer: Self-pay

## 2024-01-02 ENCOUNTER — Telehealth: Payer: Self-pay

## 2024-01-02 NOTE — Telephone Encounter (Signed)
 Pharmacy Patient Advocate Encounter  Received notification from [employer] that Prior Authorization for [facility] 0.25mg /0.52ml  has been DENIED.  Full denial letter will be uploaded to the media tab. See denial reason below.   PA #/Case ID/Reference #: [id number]

## 2024-01-02 NOTE — Telephone Encounter (Signed)
 Pharmacy Patient Advocate Encounter   Received notification from RX Request Messages that prior authorization for Wegovy 0.25mg /0.90ml is required/requested.   Insurance verification completed.   The patient is insured through [facility] ADVANTAGE/RX ADVANCE.   Per test claim: PA required; PA submitted to above mentioned insurance via Latent Key/confirmation #/EOC [facility] Status is pending

## 2024-01-03 ENCOUNTER — Other Ambulatory Visit (HOSPITAL_COMMUNITY): Payer: Self-pay

## 2024-01-03 ENCOUNTER — Other Ambulatory Visit: Payer: Self-pay | Admitting: Student

## 2024-01-03 DIAGNOSIS — I10 Essential (primary) hypertension: Secondary | ICD-10-CM

## 2024-01-03 DIAGNOSIS — G4733 Obstructive sleep apnea (adult) (pediatric): Secondary | ICD-10-CM

## 2024-01-03 DIAGNOSIS — M5030 Other cervical disc degeneration, unspecified cervical region: Secondary | ICD-10-CM

## 2024-01-03 DIAGNOSIS — E66813 Obesity, class 3: Secondary | ICD-10-CM

## 2024-01-03 DIAGNOSIS — M545 Low back pain, unspecified: Secondary | ICD-10-CM

## 2024-01-03 DIAGNOSIS — E785 Hyperlipidemia, unspecified: Secondary | ICD-10-CM

## 2024-01-04 ENCOUNTER — Other Ambulatory Visit: Payer: Self-pay | Admitting: Student

## 2024-01-04 DIAGNOSIS — I10 Essential (primary) hypertension: Secondary | ICD-10-CM

## 2024-01-04 DIAGNOSIS — G8929 Other chronic pain: Secondary | ICD-10-CM

## 2024-01-04 DIAGNOSIS — G4733 Obstructive sleep apnea (adult) (pediatric): Secondary | ICD-10-CM

## 2024-01-04 DIAGNOSIS — E785 Hyperlipidemia, unspecified: Secondary | ICD-10-CM

## 2024-01-04 DIAGNOSIS — M5030 Other cervical disc degeneration, unspecified cervical region: Secondary | ICD-10-CM

## 2024-01-04 DIAGNOSIS — E66813 Obesity, class 3: Secondary | ICD-10-CM

## 2024-01-04 NOTE — Telephone Encounter (Signed)
 Pt notified;appointment rescheduled

## 2024-01-04 NOTE — Telephone Encounter (Signed)
 I submitted a PA for the Zepbound  and the insurance denied it because it says the Zepbound  is a non-formulary medication.

## 2024-01-09 ENCOUNTER — Encounter: Payer: Self-pay | Admitting: Student

## 2024-01-10 DIAGNOSIS — J4530 Mild persistent asthma, uncomplicated: Secondary | ICD-10-CM | POA: Diagnosis not present

## 2024-01-10 DIAGNOSIS — Z6841 Body Mass Index (BMI) 40.0 and over, adult: Secondary | ICD-10-CM | POA: Diagnosis not present

## 2024-01-10 DIAGNOSIS — J310 Chronic rhinitis: Secondary | ICD-10-CM | POA: Diagnosis not present

## 2024-01-10 DIAGNOSIS — G4733 Obstructive sleep apnea (adult) (pediatric): Secondary | ICD-10-CM | POA: Diagnosis not present

## 2024-01-29 ENCOUNTER — Other Ambulatory Visit: Payer: Self-pay | Admitting: Family Medicine

## 2024-02-04 ENCOUNTER — Other Ambulatory Visit: Payer: Self-pay | Admitting: Family Medicine

## 2024-02-06 ENCOUNTER — Telehealth: Payer: Self-pay | Admitting: Family Medicine

## 2024-02-06 DIAGNOSIS — M25551 Pain in right hip: Secondary | ICD-10-CM

## 2024-02-06 NOTE — Telephone Encounter (Signed)
 Patient called stating that physical therapy has not helped and she would like to proceed with an MRI as recommended by Dr Joane.  Please advise.

## 2024-02-06 NOTE — Telephone Encounter (Signed)
 Order placed for MRI R Hip to [facility].

## 2024-02-06 NOTE — Telephone Encounter (Signed)
 Per visit note 10/25/23:  Assessment and Plan: 63 y.o. female with right lateral hip pain due to bursitis and tendinitis.  She does have a fair amount of arthritis visible on x-ray of the right hip however her pain is more lateral than anterior which would be not typical for arthritis.  She has had some improvement with PT.  Plan for greater trochanter bursa injection today and continued PT.  If not better enough next step would be MRI of the hip without contrast.   Lateral epicondylitis of the right elbow has improved considerably with PT.  Plan to continue home exercise program.

## 2024-02-08 ENCOUNTER — Ambulatory Visit: Admitting: Student

## 2024-02-10 ENCOUNTER — Other Ambulatory Visit: Payer: Self-pay | Admitting: Family Medicine

## 2024-02-10 DIAGNOSIS — E785 Hyperlipidemia, unspecified: Secondary | ICD-10-CM

## 2024-02-12 ENCOUNTER — Ambulatory Visit (HOSPITAL_BASED_OUTPATIENT_CLINIC_OR_DEPARTMENT_OTHER)
Admission: RE | Admit: 2024-02-12 | Discharge: 2024-02-12 | Disposition: A | Discharge status: Home / Self Care | Source: Ambulatory Visit | Attending: Family Medicine | Admitting: Family Medicine

## 2024-02-12 DIAGNOSIS — M25551 Pain in right hip: Secondary | ICD-10-CM | POA: Insufficient documentation

## 2024-02-12 DIAGNOSIS — M1611 Unilateral primary osteoarthritis, right hip: Secondary | ICD-10-CM | POA: Diagnosis not present

## 2024-02-12 DIAGNOSIS — M7601 Gluteal tendinitis, right hip: Secondary | ICD-10-CM | POA: Diagnosis not present

## 2024-02-14 ENCOUNTER — Ambulatory Visit: Payer: Self-pay | Admitting: Family Medicine

## 2024-02-14 NOTE — Progress Notes (Signed)
 Right hip MRI shows medium to severe hip arthritis.  There is tendinitis on the outside of the hip and at the posterior aspect of the hip.  However the arthritis is quite bad.  Recommend return to clinic after Thanksgiving to go over the results of full detail and discuss treatment plan and options.

## 2024-02-15 DIAGNOSIS — Z1272 Encounter for screening for malignant neoplasm of vagina: Secondary | ICD-10-CM | POA: Diagnosis not present

## 2024-02-15 DIAGNOSIS — N951 Menopausal and female climacteric states: Secondary | ICD-10-CM | POA: Diagnosis not present

## 2024-02-15 DIAGNOSIS — Z6839 Body mass index (BMI) 39.0-39.9, adult: Secondary | ICD-10-CM | POA: Diagnosis not present

## 2024-02-15 DIAGNOSIS — N952 Postmenopausal atrophic vaginitis: Secondary | ICD-10-CM | POA: Diagnosis not present

## 2024-02-15 DIAGNOSIS — Z124 Encounter for screening for malignant neoplasm of cervix: Secondary | ICD-10-CM | POA: Diagnosis not present

## 2024-02-15 LAB — HM PAP SMEAR: HM Pap smear: NEGATIVE

## 2024-02-28 ENCOUNTER — Encounter: Payer: Self-pay | Admitting: Family Medicine

## 2024-02-28 ENCOUNTER — Other Ambulatory Visit: Payer: Self-pay

## 2024-02-28 ENCOUNTER — Ambulatory Visit: Admitting: Family Medicine

## 2024-02-28 VITALS — BP 128/70 | HR 80 | Ht 62.0 in | Wt 227.0 lb

## 2024-02-28 DIAGNOSIS — M25551 Pain in right hip: Secondary | ICD-10-CM

## 2024-02-28 NOTE — Patient Instructions (Addendum)
 Thank you for coming in today.   You received an injection today. Seek immediate medical attention if the joint becomes red, extremely painful, or is oozing fluid.   See you back as needed.

## 2024-02-28 NOTE — Progress Notes (Signed)
 I, Sherri Sullivan, CMA acting as a scribe for Sherri Lloyd, MD.  Sherri Sullivan is a 63 y.o. female who presents to [facility] today for f/u R hip pain w/ MRI review. Pt was last seen by Dr. Lloyd on 10/25/23 and was given a R GT steroid injection and advised to cont PT.  She cont'd PT through Sept 4th, completing 15 total visits. Pt called the office early Nov reports no benefit w/ PT and a MRI was ordered.  Today, pt reports no change in sx since last visit. Only got about 1 week of relief after last injection.   Dx imaging: 02/12/24 R hip MRI 08/30/23 R hip XR  Pertinent review of systems: No fevers or chills  Relevant historical information: Hypertension   Exam:  BP 128/70   Pulse 80   Ht 5' 2 (1.575 m)   Wt 227 lb (103 kg)   SpO2 97%   BMI 41.52 kg/m  General: Well Developed, well nourished, and in no acute distress.   MSK: Right hip decreased range of motion.    Lab and Radiology Results  Procedure: Real-time Ultrasound Guided Injection of right hip intra-articular injection anterior approach Device: Philips Affiniti 50G/GE Logiq Images permanently stored and available for review in PACS Verbal informed consent obtained.  Discussed risks and benefits of procedure. Warned about infection, bleeding, hyperglycemia damage to structures among others. Patient expresses understanding and agreement Time-out conducted.   Noted no overlying erythema, induration, or other signs of local infection.   Skin prepped in a sterile fashion.   Local anesthesia: Topical Ethyl chloride.   With sterile technique and under real time ultrasound guidance: 40 mg of Kenalog  and 2 mL eaters of Marcaine  injected into hip joint. Fluid seen entering the joint capsule.   Completed without difficulty   Pain immediately resolved suggesting accurate placement of the medication.   Advised to call if fevers/chills, erythema, induration, drainage, or persistent bleeding.    Images permanently stored and available for review in the ultrasound unit.  Impression: Technically successful ultrasound guided injection.   MR HIP WITHOUT IV CONTRAST   COMPARISON: X-ray 08/30/2023   CLINICAL HISTORY: Right hip pain. Tendinitis, bursal abnormality.   PULSE SEQUENCES: AX T1, Ax T2 FS, Cor T1, COR STIR & SMALL FOV COR PD FS without contrast.   FINDINGS:   Bones and labrum: There is moderate to severe degenerative arthrosis of the right hip with joint space loss, collar osteophyte formation and subchondral reactive edema mild cystic change. There is a small joint effusion. Degenerative changes are seen in the labrum without evidence of a displaced labral tear. Pelvis, sacrum and SI joints are unremarkable. Mild degenerative changes are seen in the pubic symphysis.   Musculotendinous structures: Mild insertional tendinosis is seen in the gluteus medius and minimus tendons. Mild tendinosis is present origin the hamstrings. No accelerated tendinosis or myositis is present. There is no bursal collection. There is mild increased signal in the quadratus femoris muscle, left greater than right suggesting ischiofemoral impingement. Clinical correlation.   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 No fracture.   Mild insertional tendinosis of the gluteus medius and minimus tendons. Mild tendinosis at the origin of the hamstrings.   There is mild increased signal in the quadratus femoris muscle, left greater than right, suggesting ischiofemoral impingement. Clinical correlation.   Electronically signed by: Sherri Satchel MD  02/13/2024 11:44 AM EST RP Workstation: [id number] Sherri Sullivan Sherri Sullivan, personally (independently) visualized and performed the interpretation of the images attached in this note.      Assessment and Plan: 63 y.o. female with right hip pain.  Pain  is located the lateral and anterior hip.  Previous treatment for lateral hip related pain has not been helpful.  Today she had a diagnostic and therapeutic intra-articular hip injection which provided immediate relief indicating that the majority of her pain is coming from her hip joint.  Hopefully she will have some long-term benefit from this injection but if she does not I would recommend orthopedic surgery consultation given the severe nature of her arthritis.  She already has a relationship with Dr. Ernie.   PDMP not reviewed this encounter. Orders Placed This Encounter  Procedures   US  LIMITED JOINT SPACE STRUCTURES LOW RIGHT(NO LINKED CHARGES)    Reason for Exam (SYMPTOM  OR DIAGNOSIS REQUIRED):   right hip pain    Preferred imaging location?:   [facility]   No orders of the defined types were placed in this encounter.    Discussed warning signs or symptoms. Please see discharge instructions. Patient expresses understanding.   The above documentation has been reviewed and is accurate and complete Sherri Sullivan, M.D.

## 2024-03-06 ENCOUNTER — Other Ambulatory Visit: Payer: Self-pay | Admitting: Family Medicine

## 2024-04-02 ENCOUNTER — Other Ambulatory Visit (HOSPITAL_BASED_OUTPATIENT_CLINIC_OR_DEPARTMENT_OTHER): Payer: Self-pay

## 2024-04-03 ENCOUNTER — Ambulatory Visit: Payer: PPO

## 2024-04-06 ENCOUNTER — Ambulatory Visit

## 2024-04-11 ENCOUNTER — Ambulatory Visit (INDEPENDENT_AMBULATORY_CARE_PROVIDER_SITE_OTHER): Admitting: *Deleted

## 2024-04-11 ENCOUNTER — Other Ambulatory Visit: Payer: Self-pay | Admitting: Family Medicine

## 2024-04-11 ENCOUNTER — Telehealth: Payer: Self-pay | Admitting: *Deleted

## 2024-04-11 ENCOUNTER — Encounter: Payer: Self-pay | Admitting: Family Medicine

## 2024-04-11 VITALS — Ht 62.0 in | Wt 222.0 lb

## 2024-04-11 DIAGNOSIS — Z780 Asymptomatic menopausal state: Secondary | ICD-10-CM | POA: Diagnosis not present

## 2024-04-11 DIAGNOSIS — Z1231 Encounter for screening mammogram for malignant neoplasm of breast: Secondary | ICD-10-CM

## 2024-04-11 DIAGNOSIS — Z124 Encounter for screening for malignant neoplasm of cervix: Secondary | ICD-10-CM | POA: Diagnosis not present

## 2024-04-11 DIAGNOSIS — Z0000 Encounter for general adult medical examination without abnormal findings: Secondary | ICD-10-CM | POA: Diagnosis not present

## 2024-04-11 DIAGNOSIS — Z6841 Body Mass Index (BMI) 40.0 and over, adult: Secondary | ICD-10-CM

## 2024-04-11 MED ORDER — ALPRAZOLAM 0.25 MG PO TABS: ORAL_TABLET | ORAL | 0 refills | Status: AC

## 2024-04-11 MED ORDER — ZEPBOUND 2.5 MG/0.5ML ~~LOC~~ SOAJ: 2.5000 mg | SUBCUTANEOUS | 1 refills | Status: DC

## 2024-04-11 NOTE — Progress Notes (Signed)
 "  Please attest this visit in the absence of patient primary care provider.   Chief Complaint  Patient presents with   Medicare Wellness     Subjective:   Sherri Sullivan is a 64 y.o. female who presents for a Medicare Annual Wellness Visit.  Visit info / Clinical Intake: Medicare Wellness Visit Type:: Subsequent Annual Wellness Visit Persons participating in visit and providing information:: patient Medicare Wellness Visit Mode:: Telephone If telephone:: video declined Since this visit was completed virtually, some vitals may be partially provided or unavailable. Missing vitals are due to the limitations of the virtual format.: Unable to obtain vitals - no equipment Patient Location:: home Provider Location:: office Interpreter Needed?: No Pre-visit prep was completed: yes AWV questionnaire completed by patient prior to visit?: no Living arrangements:: lives with spouse/significant other Patient's Overall Health Status Rating: (!) fair Typical amount of pain: (!) a lot (ranges from some to a lot) Does pain affect daily life?: (!) yes Are you currently prescribed opioids?: no  Dietary Habits and Nutritional Risks How many meals a day?: 2 Eats fruit and vegetables daily?: yes (doesn't eat fruit as much) Most meals are obtained by: preparing own meals In the last 2 weeks, have you had any of the following?: none Diabetic:: no  Functional Status Activities of Daily Living (to include ambulation/medication): Independent Ambulation: Independent Medication Administration: Independent Home Management (perform basic housework or laundry): Independent Manage your own finances?: yes Primary transportation is: driving Concerns about vision?: no *vision screening is required for WTM* (up to date with [facility]) Concerns about hearing?: no  Fall Screening Falls in the past year?: 0 Number of falls in past year: 0 Was there an injury with Fall?: 0 Fall Risk Category  Calculator: 0 Patient Fall Risk Level: Low Fall Risk  Fall Risk Patient at Risk for Falls Due to: Orthopedic patient Fall risk Follow up: Falls evaluation completed  Home and Transportation Safety: All rugs have non-skid backing?: yes All stairs or steps have railings?: yes Grab bars in the bathtub or shower?: yes Have non-skid surface in bathtub or shower?: yes Good home lighting?: yes Regular seat belt use?: yes Hospital stays in the last year:: no  Cognitive Assessment Difficulty concentrating, remembering, or making decisions? : yes (Noticing some increased forgetfulness) Will 6CIT or Mini Cog be Completed: yes What year is it?: 0 points What month is it?: 0 points Give patient an address phrase to remember (5 components): [address] [place]  About what time is it?: 0 points Count backwards from 20 to 1: 0 points Say the months of the year in reverse: 0 points Repeat the address phrase from earlier: 2 points 6 CIT Score: 2 points  Advance Directives (For Healthcare) Does Patient Have a Medical Advance Directive?: Yes Does patient want to make changes to medical advance directive?: No - Patient declined Type of Advance Directive: Healthcare Power of [place]; Living will Copy of Healthcare Power of Attorney in Chart?: Yes - validated most recent copy scanned in chart (See row information) Copy of Living Will in Chart?: Yes - validated most recent copy scanned in chart (See row information)  Reviewed/Updated  Reviewed/Updated: Reviewed All (Medical, Surgical, Family, Medications, Allergies, Care Teams, Patient Goals)    Allergies (verified) Doxycycline    Current Medications (verified) Outpatient Encounter Medications as of 04/11/2024  Medication Sig   albuterol  (VENTOLIN  HFA) 108 (90 Base) MCG/ACT inhaler INHALE 2 PUFFS BY MOUTH EVERY 6 HOURS AS NEEDED FOR WHEEZING FOR SHORTNESS  OF BREATH   ALPRAZolam  (XANAX ) 0.25 MG tablet Take 1/2 to 1 (one-half  to one) tablet by mouth at bedtime as needed for anxiety   ascorbic acid (VITAMIN C) 100 MG tablet    atorvastatin  (LIPITOR) 10 MG tablet Take 1 tablet by mouth once daily   budesonide-formoterol (SYMBICORT) 160-4.5 MCG/ACT inhaler Inhale 2 puffs into the lungs 2 (two) times daily. (Patient taking differently: Inhale 2 puffs into the lungs 2 (two) times daily. Takes as needed)   Cholecalciferol (D 1000) 25 MCG (1000 UT) capsule    diclofenac  (VOLTAREN ) 75 MG EC tablet Take 1 tablet by mouth twice daily   estradiol  (ESTRACE ) 2 MG tablet Take 1 tablet by mouth once daily (Patient taking differently: Take 1 mg by mouth daily.)   fluticasone  (FLONASE ) 50 MCG/ACT nasal spray Place 2 sprays into both nostrils daily.   methocarbamol  (ROBAXIN ) 500 MG tablet Take 1 tablet (500 mg total) by mouth every 6 (six) hours as needed for muscle spasms.   metoprolol  succinate (TOPROL -XL) 25 MG 24 hr tablet TAKE 1 TABLET BY MOUTH ONCE DAILY WITH  A  MEAL  OR  IMMEDIATELY  FOLLOWING  A  MEAL   Multiple Vitamins-Minerals (ZINC PO)    OMEGA-3 KRILL OIL PO Take 353 mg by mouth daily. MegaRed Joint Care   potassium chloride  SA (KLOR-CON  M) 20 MEQ tablet Take 1 tablet by mouth twice daily   Probiotic Product (PROBIOTIC PO) Take 1 tablet by mouth in the morning.   triamterene -hydrochlorothiazide (MAXZIDE-25) 37.5-25 MG tablet Take 1 tablet by mouth daily.   Wheat Dextrin (BENEFIBER) POWD Take 2-3 scoop by mouth daily as needed.   [DISCONTINUED] pantoprazole  (PROTONIX ) 40 MG tablet Take 1 tablet by mouth once daily (Patient not taking: Reported on 04/11/2024)   [DISCONTINUED] semaglutide -weight management (WEGOVY ) 0.25 MG/0.5ML SOAJ SQ injection Inject 0.25 mg into the skin once a week. After 4 weeks, can increase to 0.5 mg weekly for 4 weeks, then 1 mg weekly for 4 weeks (Patient not taking: Reported on 04/11/2024)   No facility-administered encounter medications on file as of 04/11/2024.    History: Past Medical History:   Diagnosis Date   Anginal pain    Arthritis    L shoulder- has had injections, degenerative changes in lumbar spine    Back pain    Chicken pox as a child   Constipation    Dizziness    Dysrhythmia 2013   SVT- treated by ablation by Dr. Waddell to f/u with as needed basis    Encounter for preventative adult health care exam with abnormal findings 02/09/2015   HTN (hypertension)    Hypokalemia 02/09/2015   Joint pain    Leg edema    Measles as a child   Measles as a child   Obesity    Panic attack    during episode of feeling to crowded    Preventative health care 02/09/2015   Seasonal allergies    SOB (shortness of breath) 09/23/2011   a little bit; at rest; before ablation, SOB again now (08/2014- due to lack  of exercise)    SVT (supraventricular tachycardia)    Uterine fibroid 12/17/2012   Cervical polyp per patient Follows with [facility], Dr Tanda Mulch   Past Surgical History:  Procedure Laterality Date   ABDOMINAL HYSTERECTOMY  1990's   CARDIAC ELECTROPHYSIOLOGY STUDY AND ABLATION  09/23/11   MAXIMUM ACCESS (MAS)POSTERIOR LUMBAR INTERBODY FUSION (PLIF) 1 LEVEL N/A 09/24/2014   Procedure: L5-S1 MAS  PLIF ;  Surgeon: Fairy Levels, MD;  Location: [facility];  Service: Neurosurgery;  Laterality: N/A;  L5-S1 MAS PLIF fusion   SUPRAVENTRICULAR TACHYCARDIA ABLATION N/A 09/23/2011   Procedure: SUPRAVENTRICULAR TACHYCARDIA ABLATION;  Surgeon: Danelle LELON Birmingham, MD;  Location: [facility] CATH LAB;  Service: Cardiovascular;  Laterality: N/A;   TUBAL LIGATION  1980's   Family History  Problem Relation Age of Onset   Diabetes Mother        type 2   Heart disease Mother    Hypertension Mother    Stroke Mother    Obesity Mother    Heart disease Father    Obesity Father    Cancer Sister        lung   Hypertension Daughter    Lupus Daughter    Diabetes Maternal Aunt    Kidney disease Maternal Aunt    Diabetes Maternal Uncle    Stroke Maternal Grandmother    Breast cancer  Cousin    Colon cancer Neg Hx    Esophageal cancer Neg Hx    Rectal cancer Neg Hx    Stomach cancer Neg Hx    Social History   Occupational History   Occupation: Retired  Tobacco Use   Smoking status: Former    Current packs/day: 0.00    Average packs/day: 0.1 packs/day for 8.0 years (1.0 ttl pk-yrs)    Types: Cigarettes    Start date: 03/29/1976    Quit date: 03/29/1984    Years since quitting: 40.0   Smokeless tobacco: Never  Vaping Use   Vaping status: Never Used  Substance and Sexual Activity   Alcohol use: Yes    Alcohol/week: 14.0 standard drinks of alcohol    Types: 14 Glasses of wine per week    Comment: Drinks 2-3 glasses wine on the weekends   Drug use: No   Sexual activity: Not Currently   Tobacco Counseling Counseling given: Not Answered  SDOH Screenings   Food Insecurity: No Food Insecurity (04/11/2024)  Housing: Low Risk (04/11/2024)  Transportation Needs: No Transportation Needs (04/11/2024)  Utilities: Not At Risk (04/11/2024)  Alcohol Screen: Low Risk (12/26/2023)  Depression (PHQ2-9): Low Risk (04/11/2024)  Financial Resource Strain: Low Risk (12/26/2023)  Physical Activity: Inactive (04/11/2024)  Social Connections: Moderately Isolated (04/11/2024)  Stress: No Stress Concern Present (04/11/2024)  Tobacco Use: Medium Risk (04/11/2024)  Health Literacy: Adequate Health Literacy (03/28/2023)   See flowsheets for full screening details  Depression Screen PHQ 2 & 9 Depression Scale- Over the past 2 weeks, how often have you been bothered by any of the following problems? Little interest or pleasure in doing things: 0 Feeling down, depressed, or hopeless (PHQ Adolescent also includes...irritable): 0 PHQ-2 Total Score: 0 Trouble falling or staying asleep, or sleeping too much: 0 Feeling tired or having little energy: 0 Poor appetite or overeating (PHQ Adolescent also includes...weight loss): 0 Feeling bad about yourself - or that you are a failure or have let  yourself or your family down: 0 Trouble concentrating on things, such as reading the newspaper or watching television (PHQ Adolescent also includes...like school work): 0 Moving or speaking so slowly that other people could have noticed. Or the opposite - being so fidgety or restless that you have been moving around a lot more than usual: 0 Thoughts that you would be better off dead, or of hurting yourself in some way: 0 PHQ-9 Total Score: 0 If you checked off any problems, how difficult have these problems made it for  you to do your work, take care of things at home, or get along with other people?: Not difficult at all  Depression Treatment Depression Interventions/Treatment : EYV7-0 Score <4 Follow-up Not Indicated     Goals Addressed             This Visit's Progress    To lose enough weight to be able to come off of cholesterol / BP medication               Objective:    Today's Vitals   04/11/24 1300  Weight: 222 lb (100.7 kg)  Height: 5' 2 (1.575 m)   Body mass index is 40.6 kg/m.  Hearing/Vision screen No results found. Immunizations and Health Maintenance Health Maintenance  Topic Date Due   Cervical Cancer Screening (HPV/Pap Cotest)  01/08/2024   Mammogram  07/14/2024   Medicare Annual Wellness (AWV)  04/11/2025   DTaP/Tdap/Td (4 - Td or Tdap) 11/28/2030   Colonoscopy  11/29/2032   Pneumococcal Vaccine: 50+ Years  Completed   Influenza Vaccine  Completed   Hepatitis C Screening  Completed   Zoster Vaccines- Shingrix   Completed   Hepatitis B Vaccines 19-59 Average Risk  Aged Out   HPV VACCINES  Aged Out   Meningococcal B Vaccine  Aged Out   COVID-19 Vaccine  Discontinued   HIV Screening  Discontinued        Assessment/Plan:  This is a routine wellness examination for Sherri Sullivan.  Patient Care Team: Domenica Harlene LABOR, MD as PCP - General (Family Medicine) Unice Pac, MD as Consulting Physician (Neurosurgery) Curlene Agent, MD as Consulting Physician  (Obstetrics and Gynecology) Joane Artist RAMAN, MD as Consulting Physician (Sports Medicine) Shellia Oh, MD as Referring Physician (Pulmonary Disease) Marcey Elspeth PARAS, MD as Consulting Physician (Ophthalmology)  I have personally reviewed and noted the following in the patients chart:   Medical and social history Use of alcohol, tobacco or illicit drugs  Current medications and supplements including opioid prescriptions. Functional ability and status Nutritional status Physical activity Advanced directives List of other physicians Hospitalizations, surgeries, and ER visits in previous 12 months Vitals Screenings to include cognitive, depression, and falls Referrals and appointments  Orders Placed This Encounter  Procedures   MM 3D SCREENING MAMMOGRAM BILATERAL BREAST    Standing Status:   Future    Expected Date:   07/14/2024    Expiration Date:   04/11/2025    Reason for Exam (SYMPTOM  OR DIAGNOSIS REQUIRED):   breast cancer screening    [facility] imaging [facility]   DG Bone Density    Standing Status:   Future    Expected Date:   02/27/2025    Expiration Date:   05/28/2025    Reason for Exam (SYMPTOM  OR DIAGNOSIS REQUIRED):   postmenopausal estrogen deficiency    Preferred imaging location?:   [facility]   In addition, I have reviewed and discussed with patient certain preventive protocols, quality metrics, and best practice recommendations. A written personalized care plan for preventive services as well as general preventive health recommendations were provided to patient.   Lolita Libra, CMA   04/11/2024   Return in 1 year (on 04/11/2025).  After Visit Summary: (MyChart) Due to this being a telephonic visit, the after visit summary with patients personalized plan was offered to patient via MyChart   Nurse Notes: HM Addressed: Mammogram ordered DEXA ordered  "

## 2024-04-11 NOTE — Patient Instructions (Addendum)
 Sherri Sullivan,  Thank you for taking the time for your Medicare Wellness Visit. I appreciate your continued commitment to your health goals. Please review the care plan we discussed, and feel free to reach out if I can assist you further.  Please note that Annual Wellness Visits do not include a physical exam. Some assessments may be limited, especially if the visit was conducted virtually. If needed, we may recommend an in-person follow-up with your provider.  Goal: To lose enough weight to be able to come off of cholesterol / BP medication   Ongoing Care Seeing your primary care provider every 3 to 6 months helps us  monitor your health and provide consistent, personalized care.   Dr Domenica:  06/25/24 11:20am Medicare AWV: 04/12/25 1pm  Referrals If a referral was made during today's visit and you haven't received any updates within two weeks, please contact the referred provider directly to check on the status.  Mammogram ([facility]) due 07/14/24:  [phone] Bone Density ([facility]) due 02/27/25: [phone]  Recommended Screenings:  Health Maintenance  Topic Date Due   Breast Cancer Screening  07/14/2024   Medicare Annual Wellness Visit  04/11/2025   Pap with HPV screening  02/15/2027   DTaP/Tdap/Td vaccine (4 - Td or Tdap) 11/28/2030   Colon Cancer Screening  11/29/2032   Pneumococcal Vaccine for age over 41  Completed   Flu Shot  Completed   Hepatitis C Screening  Completed   Zoster (Shingles) Vaccine  Completed   Hepatitis B Vaccine  Aged Out   HPV Vaccine  Aged Out   Meningitis B Vaccine  Aged Out   COVID-19 Vaccine  Discontinued   HIV Screening  Discontinued       04/11/2024    1:22 PM  Advanced Directives  Does Patient Have a Medical Advance Directive? Yes  Type of Estate Agent of [place];Living will  Does patient want to make changes to medical advance directive? No - Patient declined  Copy of Healthcare Power of Attorney  in Chart? Yes - validated most recent copy scanned in chart (See row information)    Vision: Annual vision screenings are recommended for early detection of glaucoma, cataracts, and diabetic retinopathy. These exams can also reveal signs of chronic conditions such as diabetes and high blood pressure.  Dental: Annual dental screenings help detect early signs of oral cancer, gum disease, and other conditions linked to overall health, including heart disease and diabetes.  Please see the attached documents for additional preventive care recommendations.

## 2024-04-11 NOTE — Telephone Encounter (Signed)
 Pt had AWV today and is requesting refill of Alprazolam .  Last RF: 11/17/23, #30. 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  Pt's insurance did not cover Wegovy  and she never started it.  She has tried to get Zepbound  approved in the past and was denied. Pt has spoken with [employer] for pt assistance and states they are asking the PCP to mail them a prescription of Zepbound  to [address] [place], [place] [address].  I asked pt to upload a copy of the letter from [employer] to verify if anything else is needed from us  and she is agreeable.

## 2024-04-12 NOTE — Telephone Encounter (Signed)
 Patient was advised and verbalized understanding.

## 2024-04-16 ENCOUNTER — Other Ambulatory Visit: Payer: Self-pay | Admitting: Family

## 2024-04-16 MED ORDER — ZEPBOUND 2.5 MG/0.5ML ~~LOC~~ SOAJ: 2.5000 mg | SUBCUTANEOUS | 1 refills | Status: DC

## 2024-04-16 MED ORDER — TIRZEPATIDE-WEIGHT MANAGEMENT 2.5 MG/0.5ML ~~LOC~~ SOLN: 2.5000 mg | SUBCUTANEOUS | 1 refills | Status: AC

## 2024-04-16 NOTE — Telephone Encounter (Signed)
 Per paperwork that was received from [employer]:medication was prescribed under NDC that is not included in the program. They requesting the Zepound vials to be prescribed for the patient. Order is pending for approval.

## 2024-04-17 ENCOUNTER — Telehealth: Payer: Self-pay

## 2024-04-17 NOTE — Progress Notes (Signed)
 Complex Care Management Note  Care Guide Note 04/17/2024 Name: Sherri Sullivan MRN: [id number] DOB: 04/27/1960  Sherri Sullivan is a 64 y.o. year old female who sees Domenica Harlene LABOR, MD for primary care. I reached out to Sherri Sullivan by phone today to offer complex care management services.  [name] was given information about Complex Care Management services today including:   The Complex Care Management services include support from the care team which includes your Nurse Care Manager, Clinical Social Worker, or Pharmacist.  The Complex Care Management team is here to help remove barriers to the health concerns and goals most important to you. Complex Care Management services are voluntary, and the patient may decline or stop services at any time by request to their care team member.   Complex Care Management Consent Status: Patient agreed to services and verbal consent obtained.   Follow up plan:  Telephone appointment with complex care management team member scheduled for:  04/25/24 at 2:30 p.m.   Encounter Outcome:  Patient Scheduled  Dreama [facility]  [facility] [facility] VBCI Assistant Direct Dial: [phone]  Fax: [phone]

## 2024-04-25 ENCOUNTER — Other Ambulatory Visit: Admitting: Pharmacist

## 2024-04-25 DIAGNOSIS — G4733 Obstructive sleep apnea (adult) (pediatric): Secondary | ICD-10-CM

## 2024-04-25 DIAGNOSIS — Z6841 Body Mass Index (BMI) 40.0 and over, adult: Secondary | ICD-10-CM

## 2024-04-25 MED ORDER — TIRZEPATIDE-WEIGHT MANAGEMENT 5 MG/0.5ML ~~LOC~~ SOLN: 5.0000 mg | SUBCUTANEOUS | 0 refills | Status: AC

## 2024-04-25 NOTE — Progress Notes (Signed)
 "  04/25/2024 Name: Sherri Sullivan MRN: [id number] DOB: 12-Mar-1961  Chief Complaint  Patient presents with   Obesity    Sherri Sullivan is a 64 y.o. year old female who presented for a telephone visit.   They were referred to the pharmacist by their PCP for assistance in managing weight management and sleep apnea   Subjective:  Care Team: Primary Care Provider: Domenica Harlene LABOR, MD ; Next Scheduled Visit: 06/26/2024  Medication Access/Adherence  Current Pharmacy:  [facility] [address] [place], [place] - [address] [place] [address] Phone: [phone] Fax: [phone]  [facility] [place], [place] - [phone] [place] [id number] Phone: [phone] Fax: [phone]   Patient reports affordability concerns with their medications: No  Patient reports access/transportation concerns to their pharmacy: No  Patient reports adherence concerns with their medications:  No      Obesity/Overweight, Complicated by severe sleep apnea:  Current medications: Zepbound  Self pay vials 2.5mg  weekly - first dose 04/10/2024  Weight Management treatments previously prescribed: Wegovy  - not covered, never started  Tried in 2025 to get Zepbound  and Wegovy  approved for sleep apnea but prior authorization was denied.   Patient has noted her appetite has decreased with first dose of Zepbound . She has been eating smaller meals and not snacking between meals as she usually does.   In past she found it hard to resist ice cream and chips at night and once she started she kept eating.   Current physical activity: some exercise  Current medication access support: none.   Starting weight = 227 lbs Ht = 5'2 Starting BMI = 41.52  Today's weight was 222.4 lbs at home   Objective:  Lab Results  Component Value Date   HGBA1C 5.4 08/25/2023    Lab Results  Component Value Date   CREATININE 0.74  12/27/2023   BUN 12 12/27/2023   NA 141 12/27/2023   K 4.7 12/27/2023   CL 106 12/27/2023   CO2 28 12/27/2023    Lab Results  Component Value Date   CHOL 178 08/25/2023   HDL 54.30 08/25/2023   LDLCALC 83 08/25/2023   LDLDIRECT 143.0 08/29/2017   TRIG 206.0 (H) 08/25/2023   CHOLHDL 3 08/25/2023    Medications Reviewed Today     Reviewed by Carla Milling, RPH-CPP (Pharmacist) on 04/25/24 at 1458  Med List Status: <None>   Medication Order Taking? Sig Documenting Provider Last Dose Status Informant  albuterol  (VENTOLIN  HFA) 108 (90 Base) MCG/ACT inhaler [id number]  INHALE 2 PUFFS BY MOUTH EVERY 6 HOURS AS NEEDED FOR WHEEZING FOR SHORTNESS OF BREATH Domenica Harlene LABOR, MD  Active   ALPRAZolam  (XANAX ) 0.25 MG tablet [id number]  Take 1/2 to 1 (one-half to one) tablet by mouth at bedtime as needed for anxiety Domenica Harlene LABOR, MD  Active   ascorbic acid (VITAMIN C) 100 MG tablet [id number]   [provider]  Active   atorvastatin  (LIPITOR) 10 MG tablet [id number]  Take 1 tablet by mouth once daily Domenica Harlene LABOR, MD  Active   budesonide-formoterol (SYMBICORT) 160-4.5 MCG/ACT inhaler [id number]  Inhale 2 puffs into the lungs 2 (two) times daily.  Patient taking differently: Inhale 2 puffs into the lungs 2 (two) times daily. Takes as needed   [provider]  Active   Cholecalciferol (D 1000) 25 MCG (1000 UT) capsule [id number]   [provider]  Active  diclofenac  (VOLTAREN ) 75 MG EC tablet [id number]  Take 1 tablet by mouth twice daily Domenica Harlene LABOR, MD  Active   estradiol  (ESTRACE ) 2 MG tablet [id number]  Take 1 tablet by mouth once daily  Patient taking differently: Take 1 mg by mouth daily.   Domenica Harlene LABOR, MD  Active Self  fluticasone  (FLONASE ) 50 MCG/ACT nasal spray [id number]  Place 2 sprays into both nostrils daily. Domenica Harlene LABOR, MD  Active   methocarbamol  (ROBAXIN ) 500 MG tablet [id number]  Take 1 tablet (500 mg total) by mouth every 6 (six) hours as needed  for muscle spasms. Domenica Harlene LABOR, MD  Active   metoprolol  succinate (TOPROL -XL) 25 MG 24 hr tablet [id number]  TAKE 1 TABLET BY MOUTH ONCE DAILY WITH  A  MEAL  OR  IMMEDIATELY  FOLLOWING  A  MEAL Domenica Harlene LABOR, MD  Active   Multiple Vitamins-Minerals (ZINC PO) [id number]   [provider]  Active   OMEGA-3 KRILL OIL PO [id number]  Take 353 mg by mouth daily. [facility] [provider]  Active Self  potassium chloride  SA (KLOR-CON  M) 20 MEQ tablet [id number]  Take 1 tablet by mouth twice daily Domenica Harlene LABOR, MD  Active   Probiotic Product (PROBIOTIC PO) [id number]  Take 1 tablet by mouth in the morning. [provider]  Active Self  tirzepatide  (ZEPBOUND ) 2.5 MG/0.5ML injection vial [id number]  Inject 2.5 mg into the skin once a week. Douglass Caul B, FNP  Active     Discontinued 04/25/24 1449 (Duplicate)   triamterene -hydrochlorothiazide (MAXZIDE-25) 37.5-25 MG tablet [id number]  Take 1 tablet by mouth daily. Domenica Harlene LABOR, MD  Active   Wheat Dextrin [facility]) POWD [id number]  Take 2-3 scoop by mouth daily as needed. [provider]  Active               Assessment/Plan:   Obesity/Overweight: Currently unable to achieve goal weight loss of 5-10% through diet and lifestyle modifications alone - Extensive dietary counseling including education on focus on lean proteins, fruits and vegetables, whole grains and increased fiber consumption, adequate hydration - Extensive exercise counseling including eventual goal of 150 minutes of moderate intensity exercise weekly - Provided motivational interviewing. Discussed setting non-weight based goals. Patients goals - be able to stop using CPAP and lower blood pressure medications - Goal weight is 200lbs.  - Recommend to continue Zepbound  2.5mg  weekly for 3 more doses, then increase to 5mg  weekly - sent in Rx for 5mg  dose.    Follow Up Plan: 1 month  Madelin Ray, PharmD Clinical Pharmacist [place]  [facility]    "

## 2024-05-04 ENCOUNTER — Other Ambulatory Visit: Payer: Self-pay | Admitting: Family Medicine

## 2024-05-21 ENCOUNTER — Other Ambulatory Visit

## 2024-06-25 ENCOUNTER — Ambulatory Visit: Admitting: Family Medicine

## 2024-07-16 ENCOUNTER — Ambulatory Visit

## 2025-04-12 ENCOUNTER — Ambulatory Visit
